# Patient Record
Sex: Male | Born: 1990
Health system: Southern US, Academic
[De-identification: ages and names within clinical notes are randomized; demographics above are authoritative.]

## PROBLEM LIST (undated history)

## (undated) ENCOUNTER — Encounter
Attending: Student in an Organized Health Care Education/Training Program | Primary: Student in an Organized Health Care Education/Training Program

## (undated) ENCOUNTER — Encounter: Payer: MEDICARE | Attending: Psychosomatic Medicine | Primary: Psychosomatic Medicine

## (undated) ENCOUNTER — Encounter

## (undated) ENCOUNTER — Telehealth: Attending: Internal Medicine | Primary: Internal Medicine

## (undated) ENCOUNTER — Encounter: Attending: Critical Care Medicine | Primary: Critical Care Medicine

## (undated) ENCOUNTER — Encounter: Payer: MEDICARE | Attending: Internal Medicine | Primary: Internal Medicine

## (undated) ENCOUNTER — Ambulatory Visit: Payer: MEDICARE

## (undated) ENCOUNTER — Inpatient Hospital Stay

## (undated) ENCOUNTER — Encounter: Attending: Internal Medicine | Primary: Internal Medicine

## (undated) ENCOUNTER — Ambulatory Visit

## (undated) ENCOUNTER — Telehealth

## (undated) ENCOUNTER — Encounter
Payer: MEDICARE | Attending: Student in an Organized Health Care Education/Training Program | Primary: Student in an Organized Health Care Education/Training Program

## (undated) ENCOUNTER — Ambulatory Visit: Attending: Internal Medicine | Primary: Internal Medicine

## (undated) ENCOUNTER — Encounter: Payer: MEDICARE | Attending: Family Medicine | Primary: Family Medicine

## (undated) ENCOUNTER — Encounter: Attending: Psychosomatic Medicine | Primary: Psychosomatic Medicine

## (undated) ENCOUNTER — Telehealth
Attending: Student in an Organized Health Care Education/Training Program | Primary: Student in an Organized Health Care Education/Training Program

## (undated) ENCOUNTER — Encounter: Attending: Family Medicine | Primary: Family Medicine

## (undated) ENCOUNTER — Telehealth: Attending: Pulmonary Disease | Primary: Pulmonary Disease

## (undated) ENCOUNTER — Telehealth: Attending: Clinical | Primary: Clinical

## (undated) ENCOUNTER — Encounter: Payer: MEDICARE | Attending: Psychiatric/Mental Health | Primary: Psychiatric/Mental Health

## (undated) ENCOUNTER — Ambulatory Visit
Payer: MEDICARE | Attending: Student in an Organized Health Care Education/Training Program | Primary: Student in an Organized Health Care Education/Training Program

## (undated) ENCOUNTER — Telehealth: Attending: Critical Care Medicine | Primary: Critical Care Medicine

## (undated) ENCOUNTER — Ambulatory Visit: Attending: Pharmacist | Primary: Pharmacist

---
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
## 2014-02-06 ENCOUNTER — Emergency Department: Payer: Self-pay | Admitting: Emergency Medicine

## 2014-06-06 ENCOUNTER — Emergency Department: Payer: Self-pay | Admitting: Emergency Medicine

## 2014-06-06 LAB — CBC
HCT: 43.7 % (ref 40.0–52.0)
HGB: 14.9 g/dL (ref 13.0–18.0)
MCH: 27.4 pg (ref 26.0–34.0)
MCHC: 34.1 g/dL (ref 32.0–36.0)
MCV: 80 fL (ref 80–100)
Platelet: 384 10*3/uL (ref 150–440)
RBC: 5.43 10*6/uL (ref 4.40–5.90)
RDW: 14.4 % (ref 11.5–14.5)
WBC: 9.9 10*3/uL (ref 3.8–10.6)

## 2014-06-06 LAB — COMPREHENSIVE METABOLIC PANEL
ALK PHOS: 70 U/L
Albumin: 4.3 g/dL (ref 3.4–5.0)
Anion Gap: 11 (ref 7–16)
BILIRUBIN TOTAL: 0.5 mg/dL (ref 0.2–1.0)
BUN: 14 mg/dL (ref 7–18)
CALCIUM: 9.5 mg/dL (ref 8.5–10.1)
CHLORIDE: 104 mmol/L (ref 98–107)
CO2: 26 mmol/L (ref 21–32)
Creatinine: 0.67 mg/dL (ref 0.60–1.30)
EGFR (African American): >60
EGFR (Non-African Amer.): >60
GLUCOSE: 101 mg/dL — AB (ref 65–99)
Osmolality: 282 (ref 275–301)
Potassium: 4.1 mmol/L (ref 3.5–5.1)
SGOT(AST): 39 U/L — ABNORMAL HIGH (ref 15–37)
SGPT (ALT): 61 U/L (ref 12–78)
Sodium: 141 mmol/L (ref 136–145)
TOTAL PROTEIN: 7.5 g/dL (ref 6.4–8.2)

## 2014-06-06 LAB — DRUG SCREEN, URINE

## 2014-06-06 LAB — SALICYLATE LEVEL

## 2014-06-06 LAB — URINALYSIS, COMPLETE
Bacteria: NONE SEEN
Glucose,UR: NEGATIVE mg/dL (ref 0–75)
Ketone: NEGATIVE
LEUKOCYTE ESTERASE: NEGATIVE
NITRITE: NEGATIVE
PROTEIN: NEGATIVE
Ph: 7 (ref 4.5–8.0)
RBC,UR: 1 /HPF (ref 0–5)
Specific Gravity: 1.017 (ref 1.003–1.030)
Squamous Epithelial: NONE SEEN
WBC UR: NONE SEEN /HPF (ref 0–5)

## 2014-06-06 LAB — ETHANOL: Ethanol %: <0.003 % (ref 0.000–0.080)

## 2014-06-06 LAB — ACETAMINOPHEN LEVEL

## 2014-12-07 IMAGING — CR DG CHEST 1V PORT
1 series · 1 of 1 positions shown · non-contrast
Comparison: None.

CLINICAL DATA: Shortness of breath.  History of tracheomalacia.

EXAM:
PORTABLE CHEST - 1 VIEW

[ap]
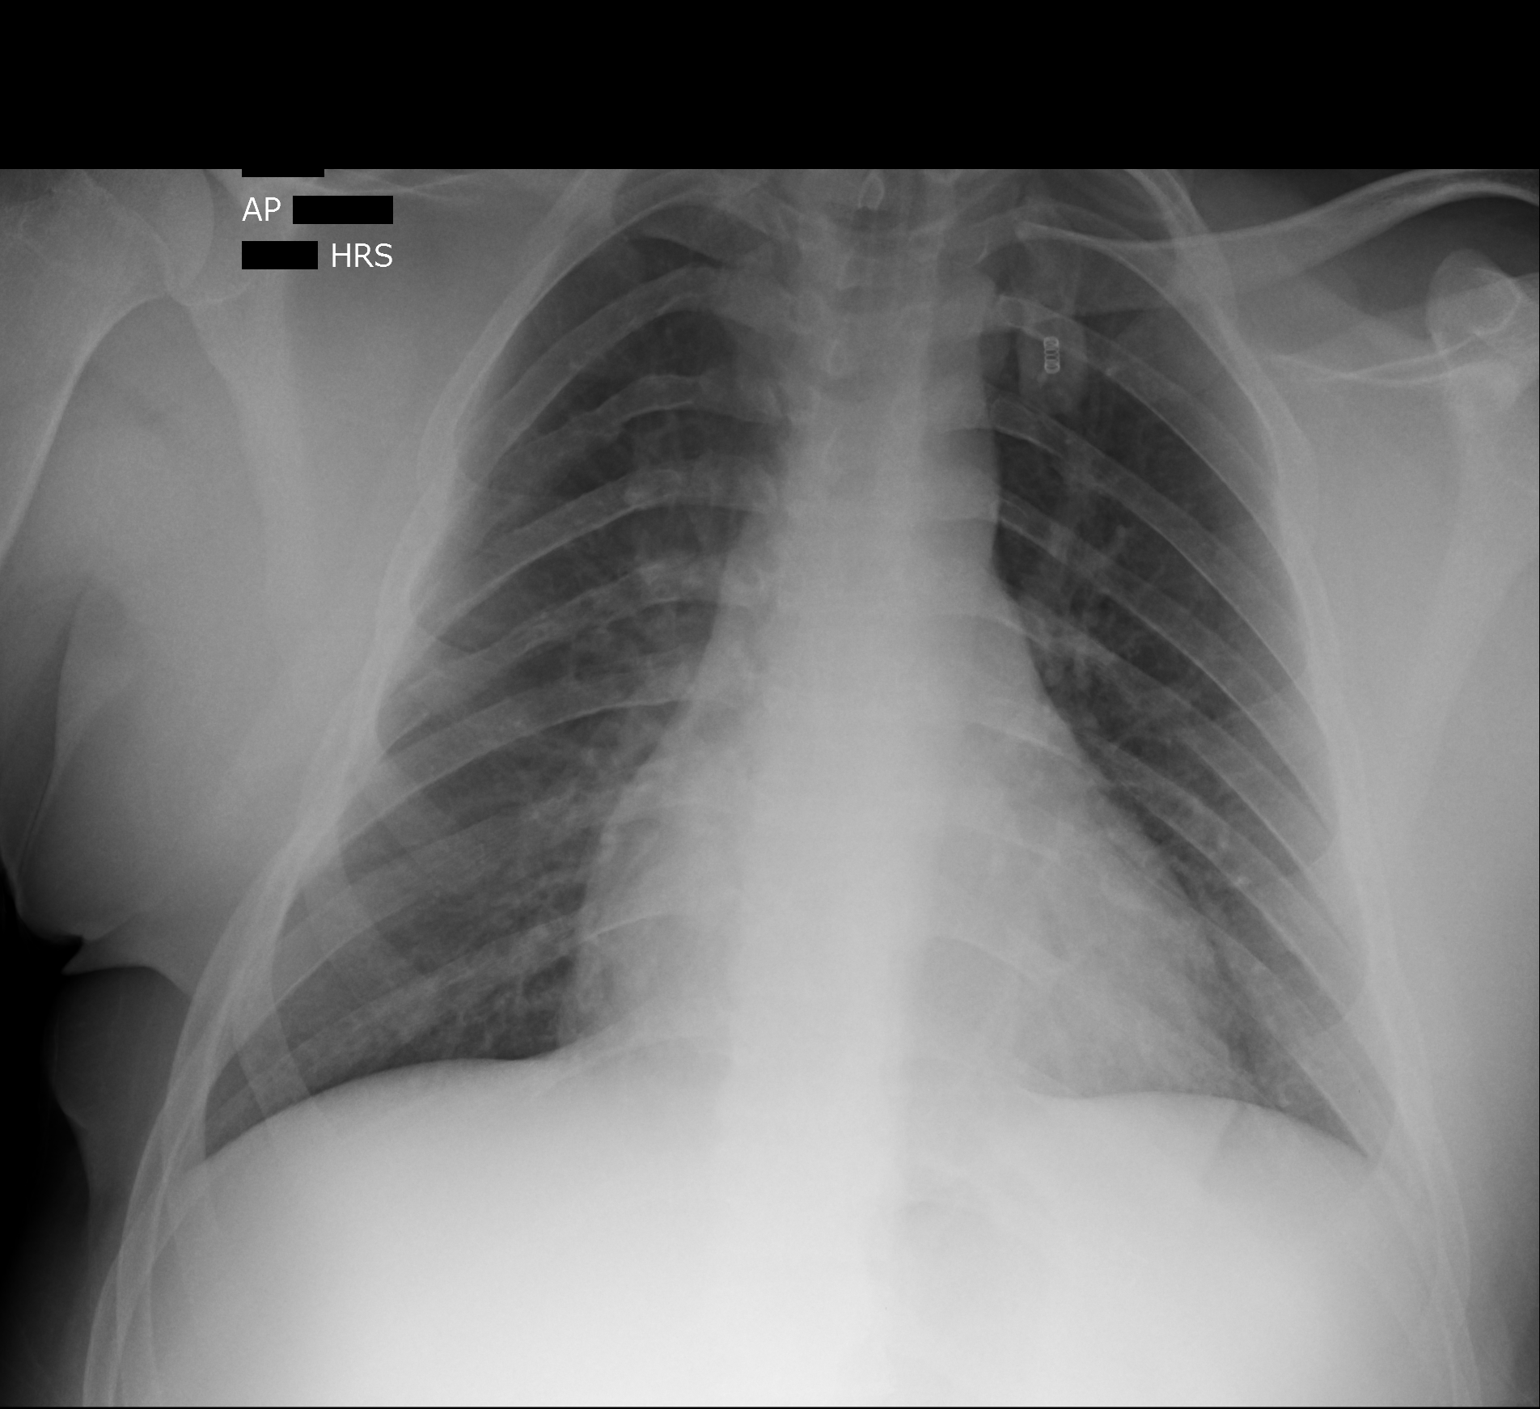

[1 of 1 positions shown; findings below may reference images not displayed]

FINDINGS: Normal heart size and mediastinal contours. No acute infiltrate or
edema. No effusion or pneumothorax. Dysplastic or previously
fractured posterior right fifth and seventh ribs.
IMPRESSION: No active disease.

## 2015-03-31 NOTE — Consult Note (Signed)
PATIENT NAME:  Rodney Morris, Rodney Morris MR#:  [id number] DATE OF BIRTH:  1991/07/27  DATE OF CONSULTATION:  06/07/2014  REFERRING PHYSICIAN:   CONSULTING PHYSICIAN:  Wise Fees K. Londen Bok, MD  AGE:  24 years.  SEX:  Male.  RACE:  White.  SUBJECTIVE:  Patient was seen in consultation in the [facility] Emergency Room.  Patient is a 24 year old white male who is not employed and is on disability for birth defect and is on oxygen and 4 nebulizers for the same.  Patient had been inpatient at [facility] for 2 years, stabilized, and currently has been living at [facility] since January 2015.  The patient was brought because he was depressed and he was upset after he shoplifted and then they would not let him go out.    MENTAL STATUS:  Patient alert and oriented.  Calm, pleasant and cooperative.  No agitation.  Affect is neutral and mood stable.  Denies feeling depressed.  Does not appear to be responding to internal stimuli.  Cognition grossly intact.  Denies any plans to hurt himself or others.  Contract for safety.  Insight and judgment adequate.    IMPRESSION:  Adjustment disorder with depressed mood.  History of schizoaffective disorder, currently stable.  Recommend DCIDC and discharge patient back to [facility] as he does need a secured environment.   ____________________________ Jannet MantisSurya K. Guss Bundehalla, MD skc:ts D: 06/07/2014 15:42:18 ET T: 06/07/2014 19:17:19 ET JOB#: [id number]  cc: Monika SalkSurya K. Guss Bundehalla, MD, <Dictator> Beau FannySURYA K Yisrael Obryan MD ELECTRONICALLY SIGNED 06/10/2014 17:42

## 2017-06-30 MED ORDER — MIRTAZAPINE 15 MG TABLET
ORAL_TABLET | Freq: Every evening | ORAL | 0 refills | 0.00000 days | Status: CP
Start: 2017-06-30 — End: 2017-06-30

## 2017-06-30 MED ORDER — MIRTAZAPINE 15 MG TABLET: tablet | 0 refills | 0 days | Status: AC

## 2017-07-31 ENCOUNTER — Ambulatory Visit
Admission: RE | Admit: 2017-07-31 | Discharge: 2017-07-31 | Payer: Commercial Managed Care - PPO | Attending: Psychiatry | Admitting: Psychiatry

## 2017-07-31 DIAGNOSIS — F4310 Post-traumatic stress disorder, unspecified: Secondary | ICD-10-CM

## 2017-07-31 DIAGNOSIS — F39 Unspecified mood [affective] disorder: Principal | ICD-10-CM

## 2017-07-31 MED ORDER — VALPROIC ACID (AS SODIUM SALT) 250 MG/5 ML ORAL SOLUTION
ORAL | 3 refills | 0.00000 days | Status: CP
Start: 2017-07-31 — End: 2017-11-05

## 2017-07-31 MED ORDER — LORAZEPAM 0.5 MG TABLET
ORAL_TABLET | ORAL | 3 refills | 0 days | Status: CP
Start: 2017-07-31 — End: 2017-11-05

## 2017-09-06 ENCOUNTER — Emergency Department
Admission: EM | Admit: 2017-09-06 | Discharge: 2017-09-06 | Disposition: A | Discharge status: Home / Self Care | Source: Intra-hospital

## 2017-09-06 ENCOUNTER — Emergency Department
Admission: EM | Admit: 2017-09-06 | Discharge: 2017-09-06 | Disposition: A | Discharge status: Home / Self Care | Payer: MEDICARE | Source: Intra-hospital

## 2017-09-06 DIAGNOSIS — R1032 Left lower quadrant pain: Principal | ICD-10-CM

## 2017-09-06 MED ORDER — OXYCODONE-ACETAMINOPHEN 5 MG-325 MG TABLET
ORAL_TABLET | Freq: Three times a day (TID) | ORAL | 0 refills | 0.00000 days | Status: SS | PRN
Start: 2017-09-06 — End: 2018-03-18

## 2017-09-06 MED ORDER — ONDANSETRON HCL 4 MG TABLET
ORAL_TABLET | Freq: Three times a day (TID) | ORAL | 0 refills | 0 days | Status: SS | PRN
Start: 2017-09-06 — End: 2018-03-16

## 2017-09-08 ENCOUNTER — Emergency Department
Admission: EM | Admit: 2017-09-08 | Discharge: 2017-09-08 | Disposition: A | Discharge status: Home / Self Care | Payer: MEDICARE | Source: Intra-hospital | Attending: Emergency Medicine

## 2017-09-08 ENCOUNTER — Emergency Department
Admission: EM | Admit: 2017-09-08 | Discharge: 2017-09-08 | Disposition: A | Discharge status: Home / Self Care | Payer: MEDICARE | Source: Intra-hospital

## 2017-09-08 DIAGNOSIS — R109 Unspecified abdominal pain: Principal | ICD-10-CM

## 2017-09-21 DIAGNOSIS — R0602 Shortness of breath: Principal | ICD-10-CM

## 2017-09-21 MED ORDER — ACETAMINOPHEN 160 MG/5 ML (5 ML) ORAL SUSPENSION
ORAL | 0 refills | 0 days | Status: SS | PRN
Start: 2017-09-21 — End: 2017-12-08

## 2017-09-24 ENCOUNTER — Inpatient Hospital Stay
Admission: EM | Admit: 2017-09-24 | Discharge: 2017-09-26 | Disposition: A | Discharge status: Home / Self Care | Payer: MEDICARE | Source: Intra-hospital

## 2017-09-24 ENCOUNTER — Inpatient Hospital Stay
Admission: EM | Admit: 2017-09-24 | Discharge: 2017-09-26 | Disposition: A | Discharge status: Home / Self Care | Payer: MEDICARE | Source: Intra-hospital | Attending: Pulmonary Disease | Admitting: Pulmonary Disease

## 2017-09-24 DIAGNOSIS — R0602 Shortness of breath: Principal | ICD-10-CM

## 2017-09-26 MED ORDER — CEFDINIR 300 MG CAPSULE
ORAL_CAPSULE | Freq: Two times a day (BID) | ORAL | 0 refills | 0.00000 days | Status: CP
Start: 2017-09-26 — End: 2018-03-14

## 2017-09-26 MED ORDER — PREDNISONE 10 MG TABLET
ORAL_TABLET | 0 refills | 0 days | Status: CP
Start: 2017-09-26 — End: 2017-12-08

## 2017-10-07 ENCOUNTER — Emergency Department
Admission: EM | Admit: 2017-10-07 | Discharge: 2017-10-08 | Disposition: A | Discharge status: Home / Self Care | Payer: MEDICARE | Source: Intra-hospital

## 2017-10-07 DIAGNOSIS — M25559 Pain in unspecified hip: Principal | ICD-10-CM

## 2017-10-08 MED ORDER — HYDROCODONE 5 MG-ACETAMINOPHEN 325 MG TABLET
ORAL_TABLET | Freq: Four times a day (QID) | ORAL | 0 refills | 0 days | Status: CP | PRN
Start: 2017-10-08 — End: 2018-03-14

## 2017-10-08 MED ORDER — IBUPROFEN 600 MG TABLET
ORAL_TABLET | Freq: Four times a day (QID) | ORAL | 0 refills | 0.00000 days | Status: SS | PRN
Start: 2017-10-08 — End: 2017-12-08

## 2017-10-21 ENCOUNTER — Ambulatory Visit: Admission: RE | Admit: 2017-10-21 | Discharge: 2017-10-21 | Payer: MEDICARE | Attending: Family | Admitting: Family

## 2017-10-21 DIAGNOSIS — H60502 Unspecified acute noninfective otitis externa, left ear: Principal | ICD-10-CM

## 2017-10-21 MED ORDER — CIPROFLOXACIN 0.3 %-DEXAMETHASONE 0.1 % EAR DROPS,SUSPENSION
Freq: Two times a day (BID) | OTIC | 0 refills | 0.00000 days | Status: CP
Start: 2017-10-21 — End: 2018-03-14

## 2017-10-26 ENCOUNTER — Inpatient Hospital Stay
Admission: EM | Admit: 2017-10-26 | Discharge: 2017-10-28 | Disposition: A | Discharge status: Home / Self Care | Payer: MEDICARE | Source: Intra-hospital

## 2017-10-26 ENCOUNTER — Inpatient Hospital Stay
Admission: EM | Admit: 2017-10-26 | Discharge: 2017-10-28 | Disposition: A | Discharge status: Home / Self Care | Payer: MEDICARE | Source: Intra-hospital | Admitting: Internal Medicine

## 2017-10-26 DIAGNOSIS — R05 Cough: Principal | ICD-10-CM

## 2017-10-27 DIAGNOSIS — R05 Cough: Principal | ICD-10-CM

## 2017-10-28 MED ORDER — GABAPENTIN 100 MG CAPSULE
ORAL_CAPSULE | Freq: Three times a day (TID) | ORAL | 0 refills | 0.00000 days | Status: SS
Start: 2017-10-28 — End: 2017-12-08

## 2017-10-28 MED ORDER — TIZANIDINE 4 MG TABLET
ORAL_TABLET | Freq: Three times a day (TID) | ORAL | 0 refills | 0.00000 days | Status: SS | PRN
Start: 2017-10-28 — End: 2017-12-08

## 2017-10-28 MED ORDER — AMOXICILLIN 875 MG-POTASSIUM CLAVULANATE 125 MG TABLET
ORAL_TABLET | Freq: Two times a day (BID) | ORAL | 0 refills | 0.00000 days | Status: CP
Start: 2017-10-28 — End: 2018-03-14

## 2017-11-05 ENCOUNTER — Ambulatory Visit
Admission: RE | Admit: 2017-11-05 | Discharge: 2017-11-05 | Payer: Commercial Managed Care - PPO | Attending: Psychosomatic Medicine | Admitting: Psychosomatic Medicine

## 2017-11-05 DIAGNOSIS — F411 Generalized anxiety disorder: Principal | ICD-10-CM

## 2017-11-05 DIAGNOSIS — F39 Unspecified mood [affective] disorder: Secondary | ICD-10-CM

## 2017-11-05 MED ORDER — VALPROIC ACID (AS SODIUM SALT) 250 MG/5 ML ORAL SOLUTION
ORAL | 3 refills | 0 days | Status: SS
Start: 2017-11-05 — End: 2017-12-08

## 2017-11-05 MED ORDER — LORAZEPAM 0.5 MG TABLET
ORAL_TABLET | ORAL | 3 refills | 0.00000 days | Status: SS
Start: 2017-11-05 — End: 2017-12-08

## 2017-11-05 MED ORDER — CITALOPRAM 40 MG TABLET
ORAL_TABLET | Freq: Every evening | ORAL | 3 refills | 0 days | Status: SS
Start: 2017-11-05 — End: 2017-12-08

## 2017-11-05 MED ORDER — MIRTAZAPINE 15 MG TABLET
ORAL_TABLET | Freq: Every evening | ORAL | 0 refills | 0.00000 days | Status: SS
Start: 2017-11-05 — End: 2017-12-08

## 2017-11-11 ENCOUNTER — Ambulatory Visit
Admission: RE | Admit: 2017-11-11 | Discharge: 2017-11-11 | Payer: MEDICARE | Attending: Community Health | Admitting: Community Health

## 2017-11-11 DIAGNOSIS — M25531 Pain in right wrist: Principal | ICD-10-CM

## 2017-11-11 DIAGNOSIS — M25431 Effusion, right wrist: Secondary | ICD-10-CM

## 2017-11-11 MED ORDER — METHYLPREDNISOLONE 4 MG TABLETS IN A DOSE PACK
ORAL_TABLET | 0 refills | 0 days | Status: CP
Start: 2017-11-11 — End: 2017-12-08

## 2017-11-11 MED ORDER — OXYCODONE 5 MG TABLET
ORAL_TABLET | Freq: Four times a day (QID) | ORAL | 0 refills | 0.00000 days | Status: SS | PRN
Start: 2017-11-11 — End: 2018-03-18

## 2017-11-17 ENCOUNTER — Emergency Department
Admission: EM | Admit: 2017-11-17 | Discharge: 2017-11-18 | Disposition: A | Discharge status: Home / Self Care | Payer: MEDICARE | Source: Intra-hospital

## 2017-11-17 ENCOUNTER — Emergency Department
Admission: EM | Admit: 2017-11-17 | Discharge: 2017-11-18 | Disposition: A | Discharge status: Home / Self Care | Payer: MEDICARE | Source: Intra-hospital | Admitting: Emergency Medicine

## 2017-11-18 DIAGNOSIS — M25551 Pain in right hip: Principal | ICD-10-CM

## 2017-11-18 DIAGNOSIS — W009XXA Unspecified fall due to ice and snow, initial encounter: Secondary | ICD-10-CM

## 2017-11-18 DIAGNOSIS — M25531 Pain in right wrist: Secondary | ICD-10-CM

## 2017-11-18 DIAGNOSIS — S63501A Unspecified sprain of right wrist, initial encounter: Principal | ICD-10-CM

## 2017-11-23 ENCOUNTER — Inpatient Hospital Stay
Admission: EM | Admit: 2017-11-23 | Discharge: 2017-12-08 | Disposition: A | Discharge status: Home Health | Payer: MEDICARE | Source: Intra-hospital

## 2017-11-23 ENCOUNTER — Ambulatory Visit: Admit: 2017-11-23 | Discharge: 2017-12-08 | Disposition: A | Discharge status: Home Health | Payer: MEDICARE

## 2017-11-23 DIAGNOSIS — R0602 Shortness of breath: Principal | ICD-10-CM

## 2017-11-24 DIAGNOSIS — R0602 Shortness of breath: Principal | ICD-10-CM

## 2017-11-26 DIAGNOSIS — R0602 Shortness of breath: Principal | ICD-10-CM

## 2017-11-27 DIAGNOSIS — R0602 Shortness of breath: Principal | ICD-10-CM

## 2017-11-29 MED ORDER — TOBRAMYCIN IVPB IN 100 ML (STANDARD DOSING) (OVF)
Freq: Two times a day (BID) | INTRAVENOUS | 0 refills | 0.00000 days | Status: CP
Start: 2017-11-29 — End: 2017-12-08

## 2017-11-29 MED ORDER — CEFEPIME 2 GRAM/100 ML IN DEXTROSE (ISO-OSMOTIC) INTRAVENOUS PIGGYBACK
Freq: Three times a day (TID) | INTRAVENOUS | 0 refills | 0.00000 days | Status: CP
Start: 2017-11-29 — End: 2017-12-08

## 2017-12-08 MED ORDER — LORAZEPAM 0.5 MG TABLET
GASTROSTOMY | 0 days | Status: SS
Start: 2017-12-08 — End: 2018-03-18

## 2017-12-08 MED ORDER — PROMETHAZINE 25 MG TABLET
ORAL_TABLET | Freq: Four times a day (QID) | GASTROSTOMY | 0 refills | 0 days | Status: CP | PRN
Start: 2017-12-08 — End: 2017-12-08

## 2017-12-08 MED ORDER — SODIUM CHLORIDE 7 % FOR NEBULIZATION
Freq: Four times a day (QID) | RESPIRATORY_TRACT | 0 refills | 0 days | Status: CP
Start: 2017-12-08 — End: 2018-05-12

## 2017-12-08 MED ORDER — CHOLECALCIFEROL (VITAMIN D3) 50 MCG (2,000 UNIT) CAPSULE
Freq: Every day | GASTROSTOMY | 0 refills | 0 days
Start: 2017-12-08 — End: ?

## 2017-12-08 MED ORDER — ACETAMINOPHEN 160 MG/5 ML (5 ML) ORAL SUSPENSION
GASTROSTOMY | 0 refills | 0 days | PRN
Start: 2017-12-08 — End: 2018-07-23

## 2017-12-08 MED ORDER — TIZANIDINE 4 MG TABLET
ORAL_TABLET | Freq: Three times a day (TID) | GASTROSTOMY | 0 refills | 0.00000 days | PRN
Start: 2017-12-08 — End: 2018-05-20

## 2017-12-08 MED ORDER — CITALOPRAM 40 MG TABLET
ORAL_TABLET | Freq: Every evening | GASTROSTOMY | 0 refills | 0 days
Start: 2017-12-08 — End: 2017-12-17

## 2017-12-08 MED ORDER — GABAPENTIN 100 MG CAPSULE
ORAL_CAPSULE | Freq: Three times a day (TID) | GASTROSTOMY | 0 refills | 0 days | Status: SS
Start: 2017-12-08 — End: 2018-02-23

## 2017-12-08 MED ORDER — LINACLOTIDE 290 MCG CAPSULE
ORAL_CAPSULE | Freq: Every day | GASTROSTOMY | 0 refills | 0.00000 days
Start: 2017-12-08 — End: ?

## 2017-12-08 MED ORDER — LEVOTHYROXINE 75 MCG TABLET
ORAL_TABLET | Freq: Every day | GASTROSTOMY | 0 refills | 0 days
Start: 2017-12-08 — End: 2018-04-16

## 2017-12-08 MED ORDER — CETIRIZINE 10 MG TABLET
Freq: Every evening | GASTROSTOMY | 0 refills | 0.00000 days
Start: 2017-12-08 — End: ?

## 2017-12-08 MED ORDER — PROMETHAZINE 6.25 MG/5 ML ORAL SYRUP
Freq: Four times a day (QID) | ORAL | 0 refills | 0.00000 days | Status: SS | PRN
Start: 2017-12-08 — End: 2018-03-18

## 2017-12-08 MED ORDER — VALPROIC ACID (AS SODIUM SALT) 250 MG/5 ML ORAL SOLUTION
GASTROSTOMY | 0 days
Start: 2017-12-08 — End: 2017-12-17

## 2017-12-08 MED ORDER — ALBUTEROL SULFATE 2.5 MG/3 ML (0.083 %) SOLUTION FOR NEBULIZATION
Freq: Four times a day (QID) | RESPIRATORY_TRACT | 0 refills | 0 days | Status: SS | PRN
Start: 2017-12-08 — End: 2018-05-07

## 2017-12-08 MED ORDER — MINOCYCLINE 100 MG CAPSULE
Freq: Every evening | GASTROSTOMY | 0 refills | 0 days
Start: 2017-12-08 — End: 2018-04-16

## 2017-12-08 MED ORDER — IPRATROPIUM BROMIDE 0.02 % SOLUTION FOR INHALATION
Freq: Four times a day (QID) | RESPIRATORY_TRACT | 12 refills | 0.00000 days | PRN
Start: 2017-12-08 — End: 2018-04-06

## 2017-12-08 MED ORDER — DICYCLOMINE 10 MG CAPSULE
ORAL_CAPSULE | Freq: Four times a day (QID) | GASTROSTOMY | 0 refills | 0.00000 days | Status: CP
Start: 2017-12-08 — End: 2018-03-14

## 2017-12-08 MED ORDER — IBUPROFEN 600 MG TABLET
ORAL_TABLET | Freq: Four times a day (QID) | GASTROSTOMY | 0 refills | 0.00000 days | PRN
Start: 2017-12-08 — End: 2018-04-16

## 2017-12-08 MED ORDER — MIRTAZAPINE 15 MG TABLET
ORAL_TABLET | Freq: Every evening | GASTROSTOMY | 0 refills | 0.00000 days
Start: 2017-12-08 — End: 2017-12-17

## 2017-12-09 MED ORDER — AZITHROMYCIN 250 MG TABLET
PACK | GASTROSTOMY | 0 refills | 0 days
Start: 2017-12-09 — End: 2018-01-07

## 2017-12-14 ENCOUNTER — Encounter: Admit: 2017-12-14 | Discharge: 2017-12-15 | Payer: MEDICARE | Attending: Family Medicine | Primary: Family Medicine

## 2017-12-14 DIAGNOSIS — M25561 Pain in right knee: Principal | ICD-10-CM

## 2017-12-17 ENCOUNTER — Encounter
Admit: 2017-12-17 | Discharge: 2017-12-18 | Payer: MEDICARE | Attending: Psychosomatic Medicine | Primary: Psychosomatic Medicine

## 2017-12-17 DIAGNOSIS — F329 Major depressive disorder, single episode, unspecified: Principal | ICD-10-CM

## 2017-12-17 MED ORDER — VALPROIC ACID (AS SODIUM SALT) 250 MG/5 ML ORAL SOLUTION: 250 mg | mL | 2 refills | 0 days | Status: AC

## 2017-12-17 MED ORDER — VALPROIC ACID (AS SODIUM SALT) 250 MG/5 ML ORAL SOLUTION
GASTROSTOMY | 2 refills | 0.00000 days | Status: CP
Start: 2017-12-17 — End: 2018-01-22

## 2017-12-17 MED ORDER — MIRTAZAPINE 15 MG TABLET
ORAL_TABLET | Freq: Every evening | GASTROSTOMY | 2 refills | 0 days
Start: 2017-12-17 — End: 2018-06-24

## 2017-12-17 MED ORDER — LORAZEPAM 0.5 MG TABLET
ORAL_TABLET | 2 refills | 0 days | Status: CP
Start: 2017-12-17 — End: ?

## 2017-12-17 MED ORDER — CITALOPRAM 40 MG TABLET
ORAL_TABLET | Freq: Every evening | GASTROSTOMY | 2 refills | 0.00000 days | Status: CP
Start: 2017-12-17 — End: 2018-06-24

## 2017-12-19 ENCOUNTER — Ambulatory Visit: Admit: 2017-12-19 | Discharge: 2017-12-21 | Disposition: A | Discharge status: Home Health | Payer: MEDICARE

## 2017-12-19 DIAGNOSIS — R05 Cough: Principal | ICD-10-CM

## 2017-12-21 MED ORDER — AMOXICILLIN 400 MG-POTASSIUM CLAVULANATE 57 MG/5 ML ORAL SUSPENSION
Freq: Two times a day (BID) | GASTROSTOMY | 0 refills | 0 days | Status: CP
Start: 2017-12-21 — End: 2017-12-26

## 2017-12-21 MED ORDER — ALBUTEROL SULFATE 2.5 MG/3 ML (0.083 %) SOLUTION FOR NEBULIZATION
RESPIRATORY_TRACT | 2 refills | 0 days | Status: SS | PRN
Start: 2017-12-21 — End: 2017-12-24

## 2017-12-21 MED ORDER — PROMETHAZINE 12.5 MG TABLET
ORAL_TABLET | Freq: Four times a day (QID) | ORAL | 0 refills | 0.00000 days | Status: SS | PRN
Start: 2017-12-21 — End: 2018-03-18

## 2017-12-21 MED ORDER — TIZANIDINE 4 MG TABLET
ORAL_TABLET | Freq: Three times a day (TID) | GASTROSTOMY | 0 refills | 0.00000 days | Status: SS | PRN
Start: 2017-12-21 — End: 2017-12-24

## 2017-12-21 MED ORDER — ONDANSETRON HCL 4 MG TABLET
ORAL_TABLET | Freq: Three times a day (TID) | ORAL | 0 refills | 0.00000 days | Status: SS | PRN
Start: 2017-12-21 — End: 2018-03-16

## 2017-12-23 ENCOUNTER — Ambulatory Visit: Admit: 2017-12-23 | Discharge: 2017-12-26 | Disposition: A | Discharge status: Home Health | Payer: MEDICARE

## 2017-12-23 DIAGNOSIS — R05 Cough: Principal | ICD-10-CM

## 2018-01-02 ENCOUNTER — Encounter: Admit: 2018-01-02 | Discharge: 2018-01-03 | Payer: MEDICARE | Attending: Family Medicine | Primary: Family Medicine

## 2018-01-02 DIAGNOSIS — R05 Cough: Secondary | ICD-10-CM

## 2018-01-02 DIAGNOSIS — R6889 Other general symptoms and signs: Principal | ICD-10-CM

## 2018-01-02 MED ORDER — PREDNISONE 10 MG TABLET
ORAL_TABLET | Freq: Every day | ORAL | 0 refills | 0.00000 days | Status: SS
Start: 2018-01-02 — End: 2018-03-18

## 2018-01-02 MED ORDER — AMOXICILLIN 875 MG-POTASSIUM CLAVULANATE 125 MG TABLET
ORAL_TABLET | Freq: Two times a day (BID) | ORAL | 0 refills | 0 days | Status: CP
Start: 2018-01-02 — End: 2018-03-14

## 2018-01-03 ENCOUNTER — Encounter: Admit: 2018-01-03 | Discharge: 2018-01-06 | Payer: MEDICARE

## 2018-01-03 ENCOUNTER — Encounter: Admit: 2018-01-03 | Discharge: 2018-01-03 | Disposition: A | Discharge status: Home / Self Care | Payer: MEDICARE

## 2018-01-03 ENCOUNTER — Emergency Department: Admit: 2018-01-03 | Discharge: 2018-01-03 | Disposition: A | Discharge status: Home / Self Care | Payer: MEDICARE

## 2018-01-03 DIAGNOSIS — R0600 Dyspnea, unspecified: Principal | ICD-10-CM

## 2018-01-07 ENCOUNTER — Ambulatory Visit: Admit: 2018-01-07 | Discharge: 2018-01-07 | Payer: MEDICARE | Attending: Internal Medicine | Primary: Internal Medicine

## 2018-01-07 ENCOUNTER — Ambulatory Visit: Admit: 2018-01-07 | Discharge: 2018-01-07 | Payer: MEDICARE

## 2018-01-07 ENCOUNTER — Encounter: Admit: 2018-01-07 | Discharge: 2018-01-07 | Payer: MEDICARE

## 2018-01-07 DIAGNOSIS — K224 Dyskinesia of esophagus: Secondary | ICD-10-CM

## 2018-01-07 DIAGNOSIS — E039 Hypothyroidism, unspecified: Secondary | ICD-10-CM

## 2018-01-07 DIAGNOSIS — R569 Unspecified convulsions: Secondary | ICD-10-CM

## 2018-01-07 DIAGNOSIS — L309 Dermatitis, unspecified: Secondary | ICD-10-CM

## 2018-01-07 DIAGNOSIS — J398 Other specified diseases of upper respiratory tract: Principal | ICD-10-CM

## 2018-01-07 DIAGNOSIS — M76891 Other specified enthesopathies of right lower limb, excluding foot: Secondary | ICD-10-CM

## 2018-01-07 DIAGNOSIS — K210 Gastro-esophageal reflux disease with esophagitis: Principal | ICD-10-CM

## 2018-01-07 DIAGNOSIS — F39 Unspecified mood [affective] disorder: Secondary | ICD-10-CM

## 2018-01-07 DIAGNOSIS — K222 Esophageal obstruction: Secondary | ICD-10-CM

## 2018-01-07 DIAGNOSIS — S6982XD Other specified injuries of left wrist, hand and finger(s), subsequent encounter: Secondary | ICD-10-CM

## 2018-01-07 MED ORDER — ALBUTEROL SULFATE HFA 90 MCG/ACTUATION AEROSOL INHALER
Freq: Four times a day (QID) | RESPIRATORY_TRACT | 11 refills | 0 days | Status: CP | PRN
Start: 2018-01-07 — End: 2018-04-06

## 2018-01-07 MED ORDER — BUDESONIDE 0.5 MG/2 ML SUSPENSION FOR NEBULIZATION
Freq: Two times a day (BID) | RESPIRATORY_TRACT | 11 refills | 0.00000 days | Status: CP
Start: 2018-01-07 — End: 2018-03-22

## 2018-01-07 MED ORDER — TRIAMCINOLONE ACETONIDE 0.1 % TOPICAL CREAM
Freq: Two times a day (BID) | TOPICAL | 11 refills | 0 days | Status: CP
Start: 2018-01-07 — End: 2019-01-07

## 2018-01-08 MED ORDER — AZITHROMYCIN 500 MG TABLET
ORAL_TABLET | GASTROSTOMY | 11 refills | 0 days | Status: CP
Start: 2018-01-08 — End: 2019-01-08

## 2018-01-13 MED ORDER — CODEINE 10 MG-GUAIFENESIN 100 MG/5 ML ORAL LIQUID
Freq: Three times a day (TID) | ORAL | 0 refills | 0 days | Status: SS | PRN
Start: 2018-01-13 — End: 2018-03-31

## 2018-01-19 ENCOUNTER — Encounter
Admit: 2018-01-19 | Discharge: 2018-01-20 | Disposition: A | Discharge status: Home / Self Care | Payer: MEDICARE | Attending: Emergency Medicine

## 2018-01-19 DIAGNOSIS — M25551 Pain in right hip: Principal | ICD-10-CM

## 2018-01-19 MED ORDER — OXYCODONE 5 MG TABLET
ORAL_TABLET | Freq: Three times a day (TID) | ORAL | 0 refills | 0 days | Status: SS | PRN
Start: 2018-01-19 — End: 2018-03-18

## 2018-01-22 ENCOUNTER — Encounter: Admit: 2018-01-22 | Discharge: 2018-01-23 | Payer: MEDICARE

## 2018-01-22 DIAGNOSIS — M25551 Pain in right hip: Principal | ICD-10-CM

## 2018-01-22 DIAGNOSIS — F39 Unspecified mood [affective] disorder: Secondary | ICD-10-CM

## 2018-01-22 DIAGNOSIS — R569 Unspecified convulsions: Secondary | ICD-10-CM

## 2018-01-22 MED ORDER — VALPROIC ACID (AS SODIUM SALT) 250 MG/5 ML ORAL SOLUTION: mL | 0 refills | 0 days | Status: AC

## 2018-01-22 MED ORDER — VALPROIC ACID (AS SODIUM SALT) 250 MG/5 ML ORAL SOLUTION
TOPICAL | 0 refills | 0.00000 days | Status: CP
Start: 2018-01-22 — End: 2018-06-24

## 2018-02-21 ENCOUNTER — Encounter: Admit: 2018-02-21 | Discharge: 2018-02-23 | Payer: MEDICARE

## 2018-02-21 ENCOUNTER — Encounter: Admit: 2018-02-21 | Discharge: 2018-02-23 | Payer: MEDICARE | Attending: Anesthesiology | Primary: Anesthesiology

## 2018-02-21 DIAGNOSIS — R1310 Dysphagia, unspecified: Principal | ICD-10-CM

## 2018-02-22 DIAGNOSIS — R1310 Dysphagia, unspecified: Principal | ICD-10-CM

## 2018-03-13 DIAGNOSIS — R0600 Dyspnea, unspecified: Principal | ICD-10-CM

## 2018-03-14 ENCOUNTER — Ambulatory Visit: Admit: 2018-03-14 | Discharge: 2018-03-18 | Disposition: A | Discharge status: Home Health | Payer: MEDICARE

## 2018-03-14 DIAGNOSIS — R0600 Dyspnea, unspecified: Principal | ICD-10-CM

## 2018-03-18 MED ORDER — AMOXICILLIN 250 MG-POTASSIUM CLAVULANATE 62.5 MG/5 ML ORAL SUSPENSION
Freq: Two times a day (BID) | ORAL | 0 refills | 0.00000 days | Status: CP
Start: 2018-03-18 — End: 2018-03-31

## 2018-03-18 MED ORDER — AMOXICILLIN 250 MG-POTASSIUM CLAVULANATE 62.5 MG/5 ML ORAL SUSPENSION: 800 mg | mL | Freq: Two times a day (BID) | 0 refills | 0 days | Status: AC

## 2018-03-22 ENCOUNTER — Ambulatory Visit
Admit: 2018-03-22 | Discharge: 2018-03-31 | Disposition: A | Discharge status: Home / Self Care | Payer: MEDICARE | Source: Ambulatory Visit | Attending: Family | Admitting: Critical Care Medicine

## 2018-03-22 ENCOUNTER — Ambulatory Visit
Admit: 2018-03-22 | Discharge: 2018-03-31 | Disposition: A | Discharge status: Home / Self Care | Payer: MEDICARE | Source: Ambulatory Visit | Admitting: Critical Care Medicine

## 2018-03-22 ENCOUNTER — Ambulatory Visit
Admit: 2018-03-22 | Discharge: 2018-03-31 | Disposition: A | Discharge status: Home / Self Care | Payer: MEDICARE | Source: Ambulatory Visit | Attending: Student in an Organized Health Care Education/Training Program | Admitting: Critical Care Medicine

## 2018-03-22 DIAGNOSIS — K224 Dyskinesia of esophagus: Principal | ICD-10-CM

## 2018-03-22 DIAGNOSIS — J42 Unspecified chronic bronchitis: Secondary | ICD-10-CM

## 2018-03-22 DIAGNOSIS — Z930 Tracheostomy status: Secondary | ICD-10-CM

## 2018-03-22 DIAGNOSIS — R0602 Shortness of breath: Secondary | ICD-10-CM

## 2018-03-22 DIAGNOSIS — Q8789 Other specified congenital malformation syndromes, not elsewhere classified: Secondary | ICD-10-CM

## 2018-03-22 DIAGNOSIS — Q320 Congenital tracheomalacia: Secondary | ICD-10-CM

## 2018-03-22 DIAGNOSIS — J3089 Other allergic rhinitis: Secondary | ICD-10-CM

## 2018-03-22 DIAGNOSIS — J45901 Unspecified asthma with (acute) exacerbation: Principal | ICD-10-CM

## 2018-03-22 DIAGNOSIS — J209 Acute bronchitis, unspecified: Principal | ICD-10-CM

## 2018-03-22 MED ORDER — MONTELUKAST 10 MG TABLET
ORAL_TABLET | Freq: Every evening | ORAL | 11 refills | 0.00000 days | Status: CP
Start: 2018-03-22 — End: 2019-03-22

## 2018-03-22 MED ORDER — BUDESONIDE 0.5 MG/2 ML SUSPENSION FOR NEBULIZATION
Freq: Two times a day (BID) | RESPIRATORY_TRACT | 11 refills | 0.00000 days | Status: CP
Start: 2018-03-22 — End: 2019-03-22

## 2018-03-22 MED ORDER — PREDNISONE 10 MG TABLET
ORAL_TABLET | 0 refills | 0 days | Status: SS
Start: 2018-03-22 — End: 2018-05-04

## 2018-03-23 DIAGNOSIS — J209 Acute bronchitis, unspecified: Principal | ICD-10-CM

## 2018-03-31 MED ORDER — PROMETHAZINE 6.25 MG/5 ML ORAL SYRUP
Freq: Four times a day (QID) | ORAL | 0 refills | 0.00000 days | Status: SS | PRN
Start: 2018-03-31 — End: 2018-03-31

## 2018-03-31 MED ORDER — LINEZOLID 600 MG TABLET
ORAL_TABLET | 0 refills | 0 days
Start: 2018-03-31 — End: 2018-07-23

## 2018-03-31 MED ORDER — CODEINE 10 MG-GUAIFENESIN 100 MG/5 ML ORAL LIQUID: 5 mL | mL | Freq: Three times a day (TID) | 0 refills | 0 days | Status: AC

## 2018-03-31 MED ORDER — ONDANSETRON 8 MG DISINTEGRATING TABLET: 8 mg | tablet | Freq: Three times a day (TID) | 0 refills | 0 days | Status: AC

## 2018-03-31 MED ORDER — ONDANSETRON 8 MG DISINTEGRATING TABLET
ORAL_TABLET | Freq: Three times a day (TID) | ORAL | 0 refills | 0.00000 days | Status: CP | PRN
Start: 2018-03-31 — End: 2018-03-31

## 2018-03-31 MED ORDER — LINEZOLID 100 MG/5 ML ORAL SUSPENSION
Freq: Two times a day (BID) | ORAL | 0 refills | 0 days | Status: SS
Start: 2018-03-31 — End: 2018-05-04

## 2018-03-31 MED ORDER — ONDANSETRON HCL 4 MG TABLET
ORAL_TABLET | Freq: Three times a day (TID) | GASTROSTOMY | 0 refills | 0.00000 days | Status: SS | PRN
Start: 2018-03-31 — End: 2018-05-04

## 2018-03-31 MED ORDER — CODEINE 10 MG-GUAIFENESIN 100 MG/5 ML ORAL LIQUID
Freq: Three times a day (TID) | ORAL | 0 refills | 0.00000 days | Status: CP | PRN
Start: 2018-03-31 — End: 2018-03-31

## 2018-03-31 MED ORDER — PROMETHAZINE 6.25 MG/5 ML ORAL SYRUP: 13 mg | mL | Freq: Four times a day (QID) | 0 refills | 0 days | Status: SS

## 2018-03-31 MED FILL — PROMETHAZINE HCL/6.25/5ML/SYRP: PROMETHAZINE HCL/6.25/5ML/SYRP | 3 days supply | Qty: 120 | Fill #0

## 2018-03-31 MED FILL — ONDANSETRON ODT/8MG ODT/TBDP: ONDANSETRON ODT/8MG ODT/TBDP | 5 days supply | Qty: 14 | Fill #0

## 2018-03-31 MED FILL — LINEZOLID/600MG/TABS: LINEZOLID/600MG/TABS | 5 days supply | Qty: 10 | Fill #0

## 2018-04-02 ENCOUNTER — Ambulatory Visit: Admit: 2018-04-02 | Discharge: 2018-04-02 | Discharge status: Against Medical Advice | Payer: MEDICARE

## 2018-04-02 ENCOUNTER — Encounter: Admit: 2018-04-02 | Discharge: 2018-04-02 | Discharge status: Against Medical Advice | Payer: MEDICARE

## 2018-04-02 DIAGNOSIS — R1032 Left lower quadrant pain: Secondary | ICD-10-CM

## 2018-04-02 DIAGNOSIS — R109 Unspecified abdominal pain: Secondary | ICD-10-CM

## 2018-04-02 DIAGNOSIS — M549 Dorsalgia, unspecified: Principal | ICD-10-CM

## 2018-04-02 MED ORDER — TAMSULOSIN 0.4 MG CAPSULE
ORAL_CAPSULE | Freq: Every day | ORAL | 0 refills | 0 days | Status: SS
Start: 2018-04-02 — End: 2018-05-04
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
## 2018-04-05 ENCOUNTER — Encounter
Admit: 2018-04-05 | Discharge: 2018-04-05 | Disposition: A | Discharge status: Home / Self Care | Payer: MEDICARE | Attending: Emergency Medicine

## 2018-04-05 DIAGNOSIS — R109 Unspecified abdominal pain: Principal | ICD-10-CM
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
## 2018-04-06 ENCOUNTER — Ambulatory Visit: Admit: 2018-04-06 | Discharge: 2018-04-06 | Payer: MEDICARE | Attending: Internal Medicine | Primary: Internal Medicine

## 2018-04-06 ENCOUNTER — Encounter: Admit: 2018-04-06 | Discharge: 2018-04-06 | Payer: MEDICARE

## 2018-04-06 DIAGNOSIS — J45901 Unspecified asthma with (acute) exacerbation: Secondary | ICD-10-CM

## 2018-04-06 DIAGNOSIS — J398 Other specified diseases of upper respiratory tract: Secondary | ICD-10-CM

## 2018-04-06 DIAGNOSIS — J479 Bronchiectasis, uncomplicated: Principal | ICD-10-CM

## 2018-04-06 DIAGNOSIS — D801 Nonfamilial hypogammaglobulinemia: Principal | ICD-10-CM

## 2018-04-06 DIAGNOSIS — J42 Unspecified chronic bronchitis: Secondary | ICD-10-CM

## 2018-04-06 MED ORDER — TOBRAMYCIN 300 MG/5 ML IN 0.225 % SODIUM CHLORIDE FOR NEBULIZATION: 300 mg | mL | Freq: Two times a day (BID) | 5 refills | 0 days | Status: AC

## 2018-04-06 MED ORDER — ARFORMOTEROL 15 MCG/2 ML SOLUTION FOR NEBULIZATION
Freq: Two times a day (BID) | RESPIRATORY_TRACT | 11 refills | 0.00000 days | Status: CP
Start: 2018-04-06 — End: 2019-04-06

## 2018-04-06 MED ORDER — ALBUTEROL SULFATE HFA 90 MCG/ACTUATION AEROSOL INHALER
Freq: Four times a day (QID) | RESPIRATORY_TRACT | 11 refills | 0.00000 days | Status: CP | PRN
Start: 2018-04-06 — End: 2019-04-06

## 2018-04-06 MED ORDER — TOBRAMYCIN 300 MG/5 ML IN 0.225 % SODIUM CHLORIDE FOR NEBULIZATION
Freq: Two times a day (BID) | RESPIRATORY_TRACT | 5 refills | 0.00000 days | Status: CP
Start: 2018-04-06 — End: 2019-04-06

## 2018-04-06 MED ORDER — BUDESONIDE-FORMOTEROL HFA 160 MCG-4.5 MCG/ACTUATION AEROSOL INHALER
Freq: Two times a day (BID) | RESPIRATORY_TRACT | 11 refills | 0 days | Status: CP
Start: 2018-04-06 — End: ?

## 2018-04-07 LAB — TOTAL IGE: Lab: 110

## 2018-04-07 NOTE — Unmapped (Signed)
[facility] Medication Referral: No PA required    Medication (Brand/Generic): TOBRAMYCIN 300MG /5ML SUSP    Initial FSI Test Claim completed with resulted information below:  No PA required  Patient ABLE to fill at [facility]  Insurance Company:  [employer] (SPLIT BILL TO MEDICAID)  Anticipated Copay: $0    As Co-pay is under $100 defined limit, per policy there will be no further investigation of need for financial assistance at this time unless patient requests. This referral has been communicated to the provider and handed off to the [facility] team for further processing and filling of prescribed medication.   ______________________________________________________________________  Please utilize this referral for viewing purposes as it will serve as the central location for all relevant documentation and updates.

## 2018-04-07 NOTE — Unmapped (Signed)
[facility] Services: On-boarding Note  Pulmonary Specialty: Adult Pulmonary (General)     Cameron Baker is enrolling with [facility] and coordination of first medication shipment.    Apr 07, 2018 11:53 AM  LVM for Cameron Baker to call CPP back re: on-boarding of 1st fill of inhaled tobramycin nebs from [facility]    Apr 08, 2018 10:17 AM  Spoke to Cameron Baker and he does not need another refill until his next cycle mid-June. Has been on inhaled tobramycin chronically and just switching specialty pharmacies. Provided him [facility] contact #.  _______________________________________________________________________________________    Patient Info/Demographics:  DOB: November 24, 1991  Phone: [phone] (home)   Confirmed Shipping address: [address], [address]  [place] [address]    Specialty Medication(s) to be sent:   -Tobramycin solution for inhalation 300mg /46mL    PARI neb cups dispensed: 1     FINANCIAL:  Primary Billing: -Medicare Part D  Anticipated Copay: $0     SHIPPING:   Scheduled Delivery Date: Not scheduled yet from [facility]   Signature Required: Yes     ______________________________________________________________________  Specialty Medication Counseling Provided to Patient:    Generic tobramycin for inhalation, 300mg /40mL)  Used to treat people with who have a bacterial infection in their lungs called  Pseudomonas aeruginosa.      Medication & Administration     Confirmed the medication and dosage: Generic tobramycin 300mg /48mL: Inhale 1 ampule (300mg ) via nebulizer every 12 hours cycled 28 days on and 28 days off.     Administration:   ? Inhale contents of ampule sitting or standing upright and breathing normally through the mouthpiece of the nebulizer until there is no longer any mist being produced.    ? Usually last medication taken when on several inhaled therapies.    Missed dose instruction:   ? If you miss a dose of Tobramycin Inhalation and it is 6 hours or less from the time you usually take your dose, then take your dose as soon as you can, then resume your next dose at the usual time. Otherwise skip the dose, and resume at your next scheduled dose.    Instructions for storage:   ? Store in the refrigerator.  ? May be stored at room temperature for up to 28 days.    Common Side Effects     ? Voice alterations,  loss of voice  ? Throat irritation  ? Bronchospasm     Warning and Precautions     Ototoxicity: If you experience tinnitus (ringing of the ears) or hearing loss with tobramycin inhalation, please contact your provider.  ______________________________________________________________________  Provided the [facility] pharmacy contact [phone], option 4    Answered all patient questions.  [facility] team will follow-up with patient monthly for standard refill processing and delivery of specialty medication.    Initiation of Therapy Assessment:   -Complete medication list, PMH, and allergies were reviewed.  No issues were identified  -Relevant cultural assessment and health literacy was completed.    -Patient is able to store his/her medication as directed  -This patient does not have any cognitive or physical disabilities   -This patient does not requires interpreter services.    The following was explained to the patient:  -A Welcome packet will be sent to the patient   -Copay or out-of-pocket responsibility  -Arrangement of payment method can be done by contacting the pharmacy  -Take medications with during travel, have  doctor's appointments, or if being admitted to the hospital.  -The patient will receive an FSI print out for each medication shipped and additional FDA Medication Guides as required.  Patient education from [employer] may also be included in the shipment.    Advised patient of refill order process:  -Emphasized need for patient to be reachable in order to schedule medication shipment.  -Pharmacy must speak to the patient every month to receive specialty medication  -Patient may call the pharmacy, or the pharmacy will call about a week before the refill is due      Next RF coordination date: 05/11/18    Anell Barr, PharmD, BCPS, CPP  Clinical Pharmacist Practitioner  [facility]  [facility]  Pager: [phone]

## 2018-04-07 NOTE — Unmapped (Signed)
Blood cx x 2 (#[id number] and #[id number]) remains with no growth at 48 hours.

## 2018-04-08 MED ORDER — NEBULIZERS
11 refills | 0 days | Status: CP
Start: 2018-04-08 — End: ?

## 2018-04-08 NOTE — Unmapped (Signed)
Blood cx x 2 (#[id number] and #[id number]) remains with no growth at 72 hours.

## 2018-04-09 NOTE — Unmapped (Signed)
Blood cx x 2 (#[id number] and #[id number]) remains with pending results of no growth at 4 days.

## 2018-04-12 NOTE — Unmapped (Signed)
Final blood cx x 2 (#[id number] and #[id number]) resulted of no growth at 5 days.

## 2018-04-16 ENCOUNTER — Encounter: Admit: 2018-04-16 | Discharge: 2018-04-17 | Payer: MEDICARE

## 2018-04-16 DIAGNOSIS — L700 Acne vulgaris: Secondary | ICD-10-CM

## 2018-04-16 DIAGNOSIS — N200 Calculus of kidney: Secondary | ICD-10-CM

## 2018-04-16 DIAGNOSIS — M76891 Other specified enthesopathies of right lower limb, excluding foot: Secondary | ICD-10-CM

## 2018-04-16 DIAGNOSIS — E039 Hypothyroidism, unspecified: Principal | ICD-10-CM

## 2018-04-16 MED ORDER — IBUPROFEN 600 MG TABLET: 600 mg | tablet | Freq: Four times a day (QID) | 1 refills | 0 days

## 2018-04-16 MED ORDER — CLINDAMYCIN 1 % TOPICAL GEL
Freq: Two times a day (BID) | TOPICAL | 6 refills | 0.00000 days | Status: CP
Start: 2018-04-16 — End: 2018-07-23

## 2018-04-16 MED ORDER — DICLOFENAC 1 % TOPICAL GEL
Freq: Four times a day (QID) | TOPICAL | 6 refills | 0 days | Status: CP
Start: 2018-04-16 — End: 2018-07-23

## 2018-04-16 MED ORDER — MINOCYCLINE 100 MG CAPSULE: 100 mg | capsule | Freq: Every evening | 3 refills | 0 days | Status: AC

## 2018-04-16 MED ORDER — IBUPROFEN 600 MG TABLET
ORAL_TABLET | Freq: Four times a day (QID) | GASTROSTOMY | 1 refills | 0.00000 days | Status: CP | PRN
Start: 2018-04-16 — End: 2018-04-16

## 2018-04-16 MED ORDER — LEVOTHYROXINE 75 MCG TABLET: 75 ug | tablet | Freq: Every day | 3 refills | 0 days

## 2018-04-16 MED ORDER — LEVOTHYROXINE 75 MCG TABLET
ORAL_TABLET | Freq: Every day | GASTROSTOMY | 3 refills | 0.00000 days
Start: 2018-04-16 — End: 2018-04-16

## 2018-04-16 MED ORDER — MINOCYCLINE 100 MG CAPSULE
ORAL_CAPSULE | Freq: Every evening | GASTROSTOMY | 3 refills | 0.00000 days | Status: CP
Start: 2018-04-16 — End: 2018-07-23

## 2018-04-16 NOTE — Unmapped (Signed)
Order for pulmonary rehab has been faxed to [facility] as requested, [phone].  [facility] LPN

## 2018-04-16 NOTE — Unmapped (Signed)
Clinic notes E-routed to [facility].
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
## 2018-04-19 NOTE — Unmapped (Signed)
Fax from [facility] placed in Dr Tollie Eth box

## 2018-04-20 ENCOUNTER — Encounter: Admit: 2018-04-20 | Discharge: 2018-04-21 | Disposition: A | Discharge status: Home / Self Care | Payer: MEDICARE

## 2018-04-20 DIAGNOSIS — M25551 Pain in right hip: Principal | ICD-10-CM

## 2018-04-20 MED ORDER — MELOXICAM 7.5 MG TABLET
ORAL_TABLET | Freq: Every day | ORAL | 0 refills | 0.00000 days | Status: CP
Start: 2018-04-20 — End: 2018-05-12

## 2018-04-21 NOTE — Unmapped (Signed)
[name] from [facility], they have approval to get Cameron Baker his DME supplies, but they can not locate [name].    [facility] needs to do a home inspection before supplying equipment.  They tried multiple addresses.
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
## 2018-04-21 NOTE — Unmapped (Signed)
Pt was walking and felt a pop in his right hip. Pt having pain and pt states that he has surgery on that hip 2 years ago

## 2018-04-22 NOTE — Unmapped (Signed)
Patient is calling back to check on the status of this request.

## 2018-04-22 NOTE — Unmapped (Signed)
Patient will need an appointment for refill of this medication.    Thank You Dianca Owensby

## 2018-04-22 NOTE — Unmapped (Signed)
Hi  He was just seen 04-16-18. Does he need another appt?

## 2018-04-23 ENCOUNTER — Encounter: Admit: 2018-04-23 | Discharge: 2018-04-23 | Payer: MEDICARE

## 2018-04-23 MED ORDER — PROMETHAZINE 6.25 MG/5 ML ORAL SYRUP
Freq: Four times a day (QID) | ORAL | 0 refills | 0 days | Status: SS | PRN
Start: 2018-04-23 — End: 2018-05-20

## 2018-04-24 NOTE — Unmapped (Signed)
Ordered promethazine syrup.    Cameron Baker

## 2018-04-27 LAB — STREP PNEUMONIAE ANTIBODY SEROTYPES
IGG SEROTYPE 14: 0.9 ug/mL
IGG SEROTYPE 18C: 0.8 ug/mL
IGG SEROTYPE 19F: 14.3 ug/mL
IGG SEROTYPE 4: 7.4 ug/mL
PNEUMOCOCCAL IGG SEROTYPE 10A: 1.8 ug/mL
PNEUMOCOCCAL IGG SEROTYPE 11A: 5.2 ug/mL
PNEUMOCOCCAL IGG SEROTYPE 12F: 0.6 ug/mL
PNEUMOCOCCAL IGG SEROTYPE 15B: <0.4 ug/mL
PNEUMOCOCCAL IGG SEROTYPE 17F: 2 ug/mL
PNEUMOCOCCAL IGG SEROTYPE 19A: 28.3 ug/mL
PNEUMOCOCCAL IGG SEROTYPE 20: <0.4 ug/mL
PNEUMOCOCCAL IGG SEROTYPE 22F: 3.1 ug/mL
PNEUMOCOCCAL IGG SEROTYPE 2: 0.5 ug/mL
PNEUMOCOCCAL IGG SEROTYPE 33F: 0.4 ug/mL

## 2018-04-27 LAB — IGG SEROTYPE 4: Streptococcus pneumoniae Danish serotype 4 Ab.IgG:MCnc:Pt:Ser:Qn:: 7.4

## 2018-04-27 MED ORDER — CODEINE 10 MG-GUAIFENESIN 100 MG/5 ML ORAL LIQUID
Freq: Three times a day (TID) | ORAL | 0 refills | 0 days | Status: SS | PRN
Start: 2018-04-27 — End: 2018-05-07

## 2018-04-28 ENCOUNTER — Emergency Department: Admit: 2018-04-28 | Discharge: 2018-04-28 | Disposition: A | Discharge status: Home / Self Care | Payer: MEDICARE

## 2018-04-28 ENCOUNTER — Encounter: Admit: 2018-04-28 | Discharge: 2018-04-28 | Disposition: A | Discharge status: Home / Self Care | Payer: MEDICARE

## 2018-04-28 DIAGNOSIS — S99912A Unspecified injury of left ankle, initial encounter: Principal | ICD-10-CM

## 2018-04-28 NOTE — Unmapped (Signed)
Patient rounds completed. The following patient needs were addressed:  Pain, Toileting, Personal Belongings, Plan of Care, Call Bell in Reach and Bed Position Low .

## 2018-04-28 NOTE — Unmapped (Signed)
Pt BIB [facility], rolled L ankle earlier today, since then it has begun to swell. Pt has complex medical hx at baseline. NAD on arrival

## 2018-04-28 NOTE — Unmapped (Addendum)
Pt to xray. NAD noted.

## 2018-04-28 NOTE — Unmapped (Signed)
Air cast applied with no difficulties.

## 2018-04-28 NOTE — Unmapped (Signed)
[facility]  Emergency Department Provider Note    ED Clinical Impression     Final diagnoses:   Sprain of left ankle, unspecified ligament, initial encounter (Primary)       Initial Impression, ED Course, Assessment and Plan     Impression: Medically complex male presenting after a SER mechanism ankle injury.  No deformity noted about the ankle.  Patient was able to ambulate immediately after however developed increasing pain likely secondary to swelling.  Will obtain ankle films as well as left foot, knee films as well.  Patient also presenting with secondary complaint of increased shortness of breath.  Will obtain chest x-ray.  Patient has discussed this with pulmonary provider here at [facility] who called a decongestant today.  Patient is otherwise not in acute distress, has a normal respiratory rate, no O2 requirement, no wheezing on auscultation.  He does have known bronchiectasis.  Will obtain plain film of the chest and reevaluate patient with likely discharge anticipated.    04:27 pain films reviewed awaiting final radiology interpretation however no fracture, dislocation visualized at the ankle, foot, knee.    04:45 radiology interpretation reviewed, agree with no fracture or dislocation.  Chest x-ray is unremarkable and otherwise reassuring.  Will offer Aircast for symptomatic treatment of pain.  Will discuss rest, ice, elevation.  We will also discussed the natural history of soft tissue injury that these issues tend to take a long time to resolve.  He should follow-up with his orthopedic surgeons if he continues to have ankle pain.  With respect to his shortness of breath no acute intervention is required, would have him follow-up with his pulmonologist in the morning if symptoms progress.    Additional Medical Decision Making     I have reviewed the vital signs and the nursing notes. Labs and radiology results that were available during my care of the patient were independently reviewed by me and considered in my medical decision making.     I staffed the case with the ED attending, Dr. Laurell Josephs.  I independently visualized the radiology images.   I reviewed the patient's prior medical records.     Portions of this record have been created using Scientist, clinical (histocompatibility and immunogenetics). Dictation errors have been sought, but may not have been identified and corrected.  ____________________________________________       History     Chief Complaint  Ankle Pain      HPI   Keymari Sato is a 27 y.o. male who presents same night after falling in a pothole while inebriated.  Patient reports foot supinated externally rotated, had sudden onset of pain however was able to ambulate medially after.  Patient now reporting pain is severe.  Reports frequently rolls ankle.  Patient also citing that he has increased shortness of breath that he is pending medication with his pulmonologist about.  No new O2 requirement.    Past Medical History:   Diagnosis Date   ??? ADHD    ??? Asthma    ??? Bipolar 1 disorder (CMS-HCC)    ??? Chronic tracheobronchitis (CMS-HCC)    ??? Constipation    ??? De Quervain's tenosynovitis    ??? Dysphagia    ??? Esophageal dysmotility    ??? Esophageal stricture    ??? GERD (gastroesophageal reflux disease)    ??? hypothyroid    ??? Seizures (CMS-HCC)    ??? Tracheobronchomalacia    ??? VATER syndrome    ??? Ventilator dependence (CMS-HCC)  Patient Active Problem List   Diagnosis   ??? Asthma   ??? Attention deficit disorder   ??? Slow transit constipation   ??? Dysphagia   ??? Esophageal reflux   ??? Hypothyroidism   ??? Class 1 obesity in adult   ??? Seizures (CMS-HCC)   ??? Gastrostomy status (CMS-HCC)   ??? Stricture and stenosis of esophagus   ??? Post traumatic stress disorder (PTSD)   ??? Mood disorder (CMS-HCC)   ??? Personality disorder (CMS-HCC)   ??? Chronic tracheobronchitis (CMS-HCC)   ??? Congenital tracheomalacia   ??? Tracheostomy dependence (CMS-HCC)   ??? VATER syndrome   ??? Muscle spasms of both lower extremities   ??? Pain and swelling of right wrist ??? Hypogammaglobulinemia (CMS-HCC)   ??? Coronavirus infection   ??? Triangular fibrocartilage complex injury, left, subsequent encounter   ??? Enthesopathy of right hip region   ??? Dyspnea       Past Surgical History:   Procedure Laterality Date   ??? ANAL DILATION  01/21/2006   ??? ESOPHAGOGASTRODUODENOSCOPY      multiple   ??? HIP SURGERY Right 01/19/2017    shaved   ??? LATERAL RECTUS RECESSION  11/15/2003   ??? NISSEN FUNDOPLICATION  2001    x 2 redos   ??? PORTACATH PLACEMENT  01/21/2006    taken out same year   ??? PR ESOPHAGEAL MOTILITY STUDY, MANOMETRY N/A 04/23/2018    Procedure: ESOPHAGEAL MOTILITY STUDY W/INT & REP;  Surgeon: Nurse-Based Giproc;  [facility];  Service: Gastroenterology   ??? PR UP GI ENDOSCOPY,BALL DIL,30MM N/A 02/22/2018    Procedure: UGI ENDO; W/BALLOON DILAT ESOPHAGUS (<30MM DIAM);  Surgeon: Zetta Bills, MD;  Location: [facility];  Service: Gastroenterology   ??? REMOVAL VENOUS ACCESS PORT [facility] HISTORICAL RESULT)  11/03/2006   ??? STRABISMUS SURGERY Right    ??? TRACHEOSTOMY  1994   ??? WISDOM TOOTH EXTRACTION  04/2012   ??? WRIST SURGERY      3 times.        No current facility-administered medications for this encounter.     Current Outpatient Medications:   ???  acetaminophen (TYLENOL) 160 mg/5 mL (5 mL) suspension, 15.6 mL (500 mg total) by G-tube route every four (4) hours as needed for pain., Disp: 1 Bottle, Rfl: 0  ???  albuterol 2.5 mg /3 mL (0.083 %) nebulizer solution, Inhale 6 mL by nebulization 4 (four) times a day as needed for wheezing., Disp: , Rfl: 0  ???  albuterol HFA 90 mcg/actuation inhaler, Inhale 2 puffs every six (6) hours as needed for wheezing., Disp: 1 Inhaler, Rfl: 11  ???  arformoterol (BROVANA), Inhale 2 mL (15 mcg total) by nebulization Two (2) times a day., Disp: 120 mL, Rfl: 11  ???  azithromycin (ZITHROMAX) 500 MG tablet, 1 tablet (500 mg total) by G-tube route 3 (three) times a week., Disp: 12 tablet, Rfl: 11  ???  budesonide (PULMICORT) 0.5 mg/2 mL nebulizer solution, Inhale 4 mL (1 mg total) by nebulization Two (2) times a day., Disp: 240 mL, Rfl: 11  ???  budesonide-formoterol (SYMBICORT) 160-4.5 mcg/actuation inhaler, Inhale 2 puffs Two (2) times a day., Disp: 1 Inhaler, Rfl: 11  ???  cetirizine (ZYRTEC) 10 MG tablet, 1 tablet (10 mg total) by G-tube route every evening., Disp: , Rfl: 0  ???  cholecalciferol, vitamin D3, (VITAMIN D3) 2,000 unit cap, 3 capsules (6,000 Units total) by G-tube route daily., Disp: , Rfl: 0  ???  citalopram (CELEXA)  40 MG tablet, 1 tablet (40 mg total) by G-tube route nightly., Disp: 30 tablet, Rfl: 2  ???  clindamycin (CLINDAGEL) 1 % gel, Apply topically Two (2) times a day., Disp: 60 g, Rfl: 6  ???  codeine-guaifenesin (GUAIFENESIN AC) 10-100 mg/5 mL liquid, Take 5 mL by mouth Three (3) times a day as needed for cough. for up to 5 days, Disp: 118 mL, Rfl: 0  ???  diclofenac sodium (VOLTAREN) 1 % gel, Apply 2 g topically Four (4) times a day., Disp: 100 g, Rfl: 6  ???  dicyclomine (BENTYL) 10 mg capsule, 10 mg by G-tube route 4 (four) times a day as needed. , Disp: , Rfl:   ???  fluticasone (FLONASE) 50 mcg/actuation nasal spray, 2 sprays by Each Nare route daily. , Disp: , Rfl:   ???  gabapentin (NEURONTIN) 300 MG capsule, 300 mg by G-tube route Three (3) times a day., Disp: , Rfl:   ???  ibuprofen (ADVIL,MOTRIN) 600 MG tablet, 1 tablet (600 mg total) by G-tube route every six (6) hours as needed for pain., Disp: 30 tablet, Rfl: 1  ???  ipratropium/albuterol sulfate (DUONEB INHL), Inhale 1 vial two (2) times a day as needed., Disp: , Rfl:   ???  levothyroxine (SYNTHROID, LEVOTHROID) 75 MCG tablet, 1 tablet (75 mcg total) by G-tube route daily., Disp: 90 tablet, Rfl: 3  ???  linaclotide (LINZESS) 290 mcg capsule, 1 capsule (290 mcg total) by G-tube route daily., Disp: 30 capsule, Rfl: 0  ???  LORazepam (ATIVAN) 0.5 MG tablet, Take 1 tablet (0.5mg ) twice daily and 1 tablet (0.5mg ) daily as needed for anxiety via G-tube, Disp: 70 tablet, Rfl: 2  ???  meloxicam (MOBIC) 7.5 MG tablet, Take 1 tablet (7.5 mg total) by mouth daily. for 15 days, Disp: 15 tablet, Rfl: 0  ???  minocycline (MINOCIN,DYNACIN) 100 MG capsule, 1 capsule (100 mg total) by G-tube route nightly., Disp: 90 capsule, Rfl: 3  ???  mirtazapine (REMERON) 15 MG tablet, 1 tablet (15 mg total) by G-tube route nightly., Disp: 30 tablet, Rfl: 2  ???  montelukast (SINGULAIR) 10 mg tablet, Take 1 tablet (10 mg total) by mouth nightly., Disp: 30 tablet, Rfl: 11  ???  omeprazole (PRILOSEC) 20 MG capsule, 20 mg Two (2) times a day (30 minutes before a meal). Via G-tube, Disp: , Rfl:   ???  ondansetron (ZOFRAN) 4 MG tablet, 1 tablet (4 mg total) by G-tube route every eight (8) hours as needed for nausea., Disp: 20 tablet, Rfl: 0  ???  oxyCODONE (ROXICODONE) 5 MG immediate release tablet, 5 mg by G-tube route every four (4) hours as needed for pain. , Disp: , Rfl:   ???  PARI LC D NEBULIZER Misc, Provide 1 device  for use with inhaled medication., Disp: 2 each, Rfl: 11  ???  predniSONE (DELTASONE) 10 MG tablet, Take 40mg  (4 tabs) daily x5 day, then 30mg  (3 tabs) daily x5 day, then 20mg  (2 tabs) daily x 5 day, then 10mg  (1 tab) daily x5 day, then off, Disp: 50 tablet, Rfl: 0  ???  promethazine (PHENERGAN) 6.25 mg/5 mL syrup, Take 5 mL (6.25 mg total) by mouth 4 (four) times a day as needed for nausea. for up to 7 days, Disp: 120 mL, Rfl: 0  ???  sodium chloride 7% 7 % Nebu, Inhale 4 mL by nebulization 4 (four) times a day. (Patient taking differently: Inhale 4 mL by nebulization 2 (two) times a day. ), Disp: 480 mL, Rfl:  0  ???  tiZANidine (ZANAFLEX) 4 MG tablet, 1 tablet (4 mg total) by G-tube route Three (3) times a day as needed. (Patient taking differently: 4 mg by G-tube route two (2) times a day as needed. ), Disp: 30 tablet, Rfl: 0  ???  tobramycin, PF, (TOBI) 300 mg/5 mL nebulizer solution, Inhale 5 mL (300 mg total) by nebulization every twelve (12) hours. Every other month, Disp: 280 mL, Rfl: 5  ???  triamcinolone (KENALOG) 0.1 % cream, Apply topically Two (2) times a day., Disp: 15 g, Rfl: 11  ???  valproate (DEPAKENE) 250 mg/5 mL syrup, TAKE BY G-TUBE EVERY MORNING AND EVERY EVENING, Disp: 1850 mL, Rfl: 0    Allergies  Xopenex [levalbuterol hcl]; Allopurinol analogues; and Versed [midazolam]    Family History   Adopted: Yes       Social History  Social History     Tobacco Use   ??? Smoking status: Never Smoker   ??? Smokeless tobacco: Never Used   Substance Use Topics   ??? Alcohol use: Yes     Alcohol/week: 1.2 oz     Types: 2 Cans of beer per week     Comment: on weekends sometimes   ??? Drug use: No       Review of Systems  All other systems have been reviewed and are negative except as otherwise documented.  Constitutional: Negative for fever.  Eyes: Negative for visual changes.  ENT: Negative for sore throat.  Cardiovascular: Negative for chest pain.  Respiratory: Negative for shortness of breath.  Gastrointestinal: Negative for abdominal pain, vomiting or diarrhea.    Physical Exam     ED Triage Vitals   Enc Vitals Group      BP       Pulse       SpO2 Pulse       Resp       Temp       Temp src       SpO2       Weight       Height       Head Circumference       Peak Flow       Pain Score       Pain Loc       Pain Edu?       Excl. in GC?      Constitutional: Alert and oriented. Well appearing and in no distress.  Eyes: Conjunctivae are normal.  ENT       Head: Normocephalic and atraumatic.       Nose: No congestion.       Mouth/Throat: Mucous membranes are moist.       Neck: No stridor.  Hematological/Lymphatic/Immunilogical: No cervical lymphadenopathy.  Cardiovascular: Normal rate, regular rhythm. Normal and symmetric distal pulses are present in all extremities.  Respiratory: Trach collar in place.  Normal respiratory effort, rate.  No increased secretions.  Patient has increased breath sounds however no wheezing, crackles appreciated.  Gastrointestinal: Soft and nontender.  Musculoskeletal: Unable to bear weight on the left lower extremity secondary to pain.  There is mild to moderate swelling about the left ankle.  No ecchymosis, tenting of skin.  No deformity noted.  Patient has sensation intact to light touch in the sural, saphenous, deep peroneal, superficial peroneal distributions.  2+ DP pulses.  Neurologic: Normal speech and language. No gross focal neurologic deficits are appreciated.  Skin: Skin is warm, dry and intact. No rash  noted.  Psychiatric: Mood and affect are normal. Speech and behavior are normal.    Radiology     EXAM: XR ANKLE 3 OR MORE VIEWS LEFT  DATE: 04/28/2018 4:31 AM  ACCESSION: [id number]  DICTATED: 04/28/2018 4:37 AM  INTERPRET[facility] LOCATION: [facility]    CLINICAL INDICATION: 27 years old Male with ankle pain- ??    COMPARISON: Concurrent foot radiographs.    TECHNIQUE: AP, oblique and lateral views of the left ankle.    FINDINGS:   The ankle mortise is intact. The talar dome is smooth. No acute fracture is evident. Mild soft tissue swelling about the ankle. Os trigonum. No radiopaque foreign body or subcutaneous emphysema.      Impression     --No fracture or dislocation of the left ankle.        EXAM: CHEST TWO VIEW  DATE: 04/28/18 at 04:04  ACCESSION: [id number]  DICTATED: 04/28/2018 4:31 AM  INTERPRETATION LOCATION: [facility]    CLINICAL INDICATION: 27 years old Male with OTHER- subjective SOB- ??    COMPARISON: 03/22/2018    TECHNIQUE:PA and lateral views of the chest.    FINDINGS:    Tracheostomy cannula is unchanged.    The lungs are well-inflated. No focal airspace opacity.     No pleural effusion or pneumothorax.    The cardiomediastinal silhouette is prominent but unchanged accounting for difference in technique.    No acute osseous abnormality.       Impression     --No acute airspace disease.      EXAM: XR FOOT 3 OR MORE VIEWS LEFT  DATE: 04/28/2018 4:32 AM  ACCESSION: [id number]  DICTATED: 04/28/2018 4:32 AM  INTERPRETATION LOCATION: [facility]    CLINICAL INDICATION: 27 years old Male with truama- COMPARISON: None.    TECHNIQUE: Dorsoplantar, oblique and lateral views of the left foot.    FINDINGS:   No fracture is evident. Joint alignment is normal. Joint spaces are preserved. Os naviculare. No radiopaque foreign body or subcutaneous emphysema. Comment soft tissue swelling along the plantar aspect of the foot.      Impression     --No fracture or dislocation of the left foot.        EXAM: XR KNEE 1 OR 2 VIEWS LEFT  DATE: 04/28/2018 4:32 AM  ACCESSION: [id number]  DICTATED: 04/28/2018 4:33 AM  INTERPRETATION LOCATION: [facility]    CLINICAL INDICATION: 27 years old Male with trauma- ??    COMPARISON: Right knee radiographs 12/19/2017.    TECHNIQUE: AP and crosstable lateral views of the left knee.    FINDINGS:   The knee is approximated. No fracture. No lipohemarthrosis. No radiopaque foreign body or soft tissue emphysema.      Impression     --No fracture or dislocation of the left knee.          Donetta Potts, MD  Resident  04/28/18 [phone]

## 2018-04-29 DIAGNOSIS — R0981 Nasal congestion: Principal | ICD-10-CM

## 2018-04-29 DIAGNOSIS — R05 Cough: Secondary | ICD-10-CM

## 2018-04-30 ENCOUNTER — Ambulatory Visit: Admit: 2018-04-30 | Discharge: 2018-04-30 | Disposition: A | Discharge status: Home / Self Care | Payer: MEDICARE

## 2018-04-30 ENCOUNTER — Encounter: Admit: 2018-04-30 | Discharge: 2018-04-30 | Disposition: A | Discharge status: Home / Self Care | Payer: MEDICARE

## 2018-04-30 LAB — COMPREHENSIVE METABOLIC PANEL
ALBUMIN: 4.7 g/dL (ref 3.5–5.0)
ALKALINE PHOSPHATASE: 73 U/L (ref 38–126)
ALT (SGPT): 53 U/L (ref 19–72)
ANION GAP: 14 mmol/L (ref 9–15)
AST (SGOT): 32 U/L (ref 19–55)
BILIRUBIN TOTAL: 1.1 mg/dL (ref 0.0–1.2)
BLOOD UREA NITROGEN: 11 mg/dL (ref 7–21)
BUN / CREAT RATIO: 14
CHLORIDE: 100 mmol/L (ref 98–107)
CO2: 25 mmol/L (ref 22.0–30.0)
CREATININE: 0.76 mg/dL (ref 0.70–1.30)
EGFR MDRD NON AF AMER: >=60 mL/min/{1.73_m2} (ref >=60)
GLUCOSE RANDOM: 93 mg/dL (ref 65–179)
POTASSIUM: 4 mmol/L (ref 3.5–5.0)
PROTEIN TOTAL: 7.4 g/dL (ref 6.5–8.3)
SODIUM: 139 mmol/L (ref 135–145)

## 2018-04-30 LAB — CBC W/ AUTO DIFF
BASOPHILS ABSOLUTE COUNT: 0 10*9/L (ref 0.0–0.1)
BASOPHILS RELATIVE PERCENT: 0.4 %
EOSINOPHILS ABSOLUTE COUNT: 0.1 10*9/L (ref 0.0–0.4)
EOSINOPHILS RELATIVE PERCENT: 0.7 %
HEMATOCRIT: 42.9 % (ref 41.0–53.0)
LARGE UNSTAINED CELLS: 2 % (ref 0–4)
LYMPHOCYTES ABSOLUTE COUNT: 2 10*9/L (ref 1.5–5.0)
LYMPHOCYTES RELATIVE PERCENT: 21.7 %
MEAN CORPUSCULAR HEMOGLOBIN CONC: 32.4 g/dL (ref 31.0–37.0)
MEAN CORPUSCULAR HEMOGLOBIN: 24.9 pg — ABNORMAL LOW (ref 26.0–34.0)
MEAN CORPUSCULAR VOLUME: 76.9 fL — ABNORMAL LOW (ref 80.0–100.0)
MEAN PLATELET VOLUME: 7.5 fL (ref 7.0–10.0)
MONOCYTES ABSOLUTE COUNT: 0.6 10*9/L (ref 0.2–0.8)
MONOCYTES RELATIVE PERCENT: 6.8 %
NEUTROPHILS RELATIVE PERCENT: 68.7 %
PLATELET COUNT: 429 10*9/L (ref 150–440)
RED BLOOD CELL COUNT: 5.58 10*12/L (ref 4.50–5.90)
RED CELL DISTRIBUTION WIDTH: 14.3 % (ref 12.0–15.0)
WBC ADJUSTED: 9.4 10*9/L (ref 4.5–11.0)

## 2018-04-30 LAB — BASOPHILS ABSOLUTE COUNT: Lab: 0

## 2018-04-30 LAB — CALCIUM: Calcium:MCnc:Pt:Ser/Plas:Qn:: 10

## 2018-04-30 MED ORDER — DOXYCYCLINE HYCLATE 100 MG CAPSULE
ORAL_CAPSULE | Freq: Two times a day (BID) | ORAL | 0 refills | 0 days | Status: SS
Start: 2018-04-30 — End: 2018-05-07

## 2018-04-30 NOTE — Unmapped (Signed)
Pt here for c/o of SOB, congestion, and increased use of nebs, recommended eval by pulmonologist. Janina Mayo with passy muir valve

## 2018-04-30 NOTE — Unmapped (Signed)
Pt back from xray. NAD noted. Labs to be collected.

## 2018-04-30 NOTE — Unmapped (Signed)
Taxi voucher given to pt.   resp collected culture and sent to lab.

## 2018-04-30 NOTE — Unmapped (Signed)
Patient rounds completed. The following patient needs were addressed:  Pain, Toileting, Personal Belongings, Plan of Care, Call Bell in Reach and Bed Position Low .

## 2018-04-30 NOTE — Unmapped (Signed)
Completed forms, left at unit clerk desk for faxing.     Cameron Baker

## 2018-04-30 NOTE — Unmapped (Signed)
Form faxed to Serita Sheller and confirmation received

## 2018-04-30 NOTE — Unmapped (Signed)
Pt suctioned for Lower Respiratory Culture via tracheostomy tube using 66fr suction catheter.  Pt tolerated well.  Post-sxn SpO2=98% on room air, HR=98, RR=18.

## 2018-04-30 NOTE — Unmapped (Signed)
Patient transported to X-ray  Transported by Radiology  How tranported Stretcher  Cardiac Monitor no

## 2018-04-30 NOTE — Unmapped (Signed)
Patient Advice Request - Ancillary Staff [facility] Name and relation (if other than patient): [facility] / Lavina Hamman  Question/Concern for provider (Specific): Vernona Rieger is calling to track forms that were placed in the provider's box on 04/19/2018  Best Contact Time: [phone]  Other info: Forms - Plan Of Care & Addendum

## 2018-04-30 NOTE — Unmapped (Signed)
Form completed and faxed. 

## 2018-04-30 NOTE — Unmapped (Signed)
Preliminary lower respiratory culture #[id number] (tracheal aspirate) report is pending.  Gram stain resulted <10 ??Epithelial cells per low power field, <10 ??PMNS/LPF, 1+ Gram negative bacilli and 1+ Gram positive bacilli.  Pt was discharged with a prescription for doxycycline (VIBRAMYCIN) 100 MG capsule, Take 1 capsule (100 mg total) by mouth Two (2) times a day for 10 days.

## 2018-04-30 NOTE — Unmapped (Signed)
D/c paperwork reviewed with pt.   Waiting for resp. To obtain culture from trach.   Pt asking for taxi voucher. Will speak with CN

## 2018-04-30 NOTE — Unmapped (Signed)
Received page after hours. He reports increase cough starting a few days ago. Started taking leftover prednisone 2 days ago. Was seen in ED yesterday early morning for ankle sprain. Complained of cough but CXR clear. He did not contact the office or his primary pulmonologist yesterday or today despite worsening symptoms. He now complains of copious tracheal secretions with need for frequent suctioning. Despite aggressive airway clearance he has worsened. No fevers. Mentions friend has been sick and thinks he has same infection. Offered a course of oral antibiotics but he will be unable to get them until the morning. He feels he needs to be seen tonight and thinks he needs to go to ED. I informed him that I would not ever disagree with someone who felt ill enough to go to the ED.     Recommend obtaining sputum culture, respiratory pathogen panel. Further care as determined by ED provider.

## 2018-04-30 NOTE — Unmapped (Signed)
Pt verbalizes no transportation and no one to call.   CM contacted for voucher.

## 2018-04-30 NOTE — Unmapped (Signed)
[facility]   Emergency Department Provider Note    ED Clinical Impression     Final diagnoses:   Upper respiratory tract infection, unspecified type (Primary)     Impression, Clinical Decision Making    Time seen: Apr 29, 2018 11:41 PM    BP 128/87  - Pulse 98  - Temp 36.7 ??C (98.1 ??F) (Oral)  - Resp 18  - SpO2 98%     Impression/Decision Making: 27 year old male with a history of VATER, history of chronic pulmonary infections, bronchiectasis, who presents for evaluation of increasing cough congestion.  No fevers or chills, no nausea vomiting or diarrhea.  - Patient is alert, oriented, nontoxic-appearing.  Vital signs significant for tachycardia initially that improved with a liter of IV fluids.  Patient with bilateral breath sounds with appears to be transmitted upper airway noise from copious secretions.  Patient is not tachypneic, no respiratory distress.  - We will plan for chest x-ray, basic labs, IV fluids, breathing treatment and reassess.     Plan Discussed with my attending, Dr. Norlene Campbell, who is agreeable.     Progress Notes:   - The patient's laboratory work-up is reassuring, chest x-ray clear.  Patient remains well-appearing in no respiratory distress.  We will plan for discharge with prescription for doxycycline for community acquired infectious coverage.  Patient was given return precautions and instructed to follow-up with pulmonology.  He states understanding and agreement with this plan.  ____________________________________________    I have reviewed the triage vital signs and the nursing notes.    Portions of this note have been dictated using Animal nutritionist. Errors have been sought but all may not have been found or corrected.      History     Chief Complaint  Congestion    HPI   Cameron Baker is a 27 y.o. male history of VATER, history of chronic pulmonary infections, bronchiectasis, who presents for evaluation of increasing cough congestion.  No fevers or chills, no nausea vomiting or diarrhea.  Patient is followed by pulmonology here at [facility].  He also states that he has been taking some prednisone that he had at home.  Denies chest pain, no abdominal pain.    Past Medical History:   Diagnosis Date   ??? ADHD    ??? Asthma    ??? Bipolar 1 disorder (CMS-HCC)    ??? Chronic tracheobronchitis (CMS-HCC)    ??? Constipation    ??? De Quervain's tenosynovitis    ??? Dysphagia    ??? Esophageal dysmotility    ??? Esophageal stricture    ??? GERD (gastroesophageal reflux disease)    ??? hypothyroid    ??? Seizures (CMS-HCC)    ??? Tracheobronchomalacia    ??? VATER syndrome    ??? Ventilator dependence (CMS-HCC)        Patient Active Problem List   Diagnosis   ??? Asthma   ??? Attention deficit disorder   ??? Slow transit constipation   ??? Dysphagia   ??? Esophageal reflux   ??? Hypothyroidism   ??? Class 1 obesity in adult   ??? Seizures (CMS-HCC)   ??? Gastrostomy status (CMS-HCC)   ??? Stricture and stenosis of esophagus   ??? Post traumatic stress disorder (PTSD)   ??? Mood disorder (CMS-HCC)   ??? Personality disorder (CMS-HCC)   ??? Chronic tracheobronchitis (CMS-HCC)   ??? Congenital tracheomalacia   ??? Tracheostomy dependence (CMS-HCC)   ??? VATER syndrome   ??? Muscle spasms of both lower extremities   ???  Pain and swelling of right wrist   ??? Hypogammaglobulinemia (CMS-HCC)   ??? Coronavirus infection   ??? Triangular fibrocartilage complex injury, left, subsequent encounter   ??? Enthesopathy of right hip region   ??? Dyspnea       Past Surgical History:   Procedure Laterality Date   ??? ANAL DILATION  01/21/2006   ??? ESOPHAGOGASTRODUODENOSCOPY      multiple   ??? HIP SURGERY Right 01/19/2017    shaved   ??? LATERAL RECTUS RECESSION  11/15/2003   ??? NISSEN FUNDOPLICATION  2001    x 2 redos   ??? PORTACATH PLACEMENT  01/21/2006    taken out same year   ??? PR ESOPHAGEAL MOTILITY STUDY, MANOMETRY N/A 04/23/2018    Procedure: ESOPHAGEAL MOTILITY STUDY W/INT & REP;  Surgeon: Nurse-Based Giproc;  [facility]: [facility];  Service: Gastroenterology   ??? PR UP GI ENDOSCOPY,BALL DIL,30MM N/A 02/22/2018    Procedure: UGI ENDO; W/BALLOON DILAT ESOPHAGUS (<30MM DIAM);  Surgeon: Zetta Bills, MD;  Location: [facility] [place];  Service: Gastroenterology   ??? REMOVAL VENOUS ACCESS PORT [facility] HISTORICAL RESULT)  11/03/2006   ??? STRABISMUS SURGERY Right    ??? TRACHEOSTOMY  1994   ??? WISDOM TOOTH EXTRACTION  04/2012   ??? WRIST SURGERY      3 times.        No current facility-administered medications for this encounter.     Current Outpatient Medications:   ???  acetaminophen (TYLENOL) 160 mg/5 mL (5 mL) suspension, 15.6 mL (500 mg total) by G-tube route every four (4) hours as needed for pain., Disp: 1 Bottle, Rfl: 0  ???  albuterol 2.5 mg /3 mL (0.083 %) nebulizer solution, Inhale 6 mL by nebulization 4 (four) times a day as needed for wheezing., Disp: , Rfl: 0  ???  albuterol HFA 90 mcg/actuation inhaler, Inhale 2 puffs every six (6) hours as needed for wheezing., Disp: 1 Inhaler, Rfl: 11  ???  arformoterol (BROVANA), Inhale 2 mL (15 mcg total) by nebulization Two (2) times a day., Disp: 120 mL, Rfl: 11  ???  azithromycin (ZITHROMAX) 500 MG tablet, 1 tablet (500 mg total) by G-tube route 3 (three) times a week., Disp: 12 tablet, Rfl: 11  ???  budesonide (PULMICORT) 0.5 mg/2 mL nebulizer solution, Inhale 4 mL (1 mg total) by nebulization Two (2) times a day., Disp: 240 mL, Rfl: 11  ???  budesonide-formoterol (SYMBICORT) 160-4.5 mcg/actuation inhaler, Inhale 2 puffs Two (2) times a day., Disp: 1 Inhaler, Rfl: 11  ???  cetirizine (ZYRTEC) 10 MG tablet, 1 tablet (10 mg total) by G-tube route every evening., Disp: , Rfl: 0  ???  cholecalciferol, vitamin D3, (VITAMIN D3) 2,000 unit cap, 3 capsules (6,000 Units total) by G-tube route daily., Disp: , Rfl: 0  ???  citalopram (CELEXA) 40 MG tablet, 1 tablet (40 mg total) by G-tube route nightly., Disp: 30 tablet, Rfl: 2  ???  clindamycin (CLINDAGEL) 1 % gel, Apply topically Two (2) times a day., Disp: 60 g, Rfl: 6  ???  codeine-guaifenesin (GUAIFENESIN AC) 10-100 mg/5 mL liquid, Take 5 mL by mouth Three (3) times a day as needed for cough. for up to 5 days, Disp: 118 mL, Rfl: 0  ???  diclofenac sodium (VOLTAREN) 1 % gel, Apply 2 g topically Four (4) times a day., Disp: 100 g, Rfl: 6  ???  dicyclomine (BENTYL) 10 mg capsule, 10 mg by G-tube route 4 (four) times a day as needed. ,  Disp: , Rfl:   ???  doxycycline (VIBRAMYCIN) 100 MG capsule, Take 1 capsule (100 mg total) by mouth Two (2) times a day. for 10 days, Disp: 20 capsule, Rfl: 0  ???  fluticasone (FLONASE) 50 mcg/actuation nasal spray, 2 sprays by Each Nare route daily. , Disp: , Rfl:   ???  gabapentin (NEURONTIN) 300 MG capsule, 300 mg by G-tube route Three (3) times a day., Disp: , Rfl:   ???  ibuprofen (ADVIL,MOTRIN) 600 MG tablet, 1 tablet (600 mg total) by G-tube route every six (6) hours as needed for pain., Disp: 30 tablet, Rfl: 1  ???  ipratropium/albuterol sulfate (DUONEB INHL), Inhale 1 vial two (2) times a day as needed., Disp: , Rfl:   ???  levothyroxine (SYNTHROID, LEVOTHROID) 75 MCG tablet, 1 tablet (75 mcg total) by G-tube route daily., Disp: 90 tablet, Rfl: 3  ???  linaclotide (LINZESS) 290 mcg capsule, 1 capsule (290 mcg total) by G-tube route daily., Disp: 30 capsule, Rfl: 0  ???  LORazepam (ATIVAN) 0.5 MG tablet, Take 1 tablet (0.5mg ) twice daily and 1 tablet (0.5mg ) daily as needed for anxiety via G-tube, Disp: 70 tablet, Rfl: 2  ???  meloxicam (MOBIC) 7.5 MG tablet, Take 1 tablet (7.5 mg total) by mouth daily. for 15 days, Disp: 15 tablet, Rfl: 0  ???  minocycline (MINOCIN,DYNACIN) 100 MG capsule, 1 capsule (100 mg total) by G-tube route nightly., Disp: 90 capsule, Rfl: 3  ???  mirtazapine (REMERON) 15 MG tablet, 1 tablet (15 mg total) by G-tube route nightly., Disp: 30 tablet, Rfl: 2  ???  montelukast (SINGULAIR) 10 mg tablet, Take 1 tablet (10 mg total) by mouth nightly., Disp: 30 tablet, Rfl: 11  ???  omeprazole (PRILOSEC) 20 MG capsule, 20 mg Two (2) times a day (30 minutes before a meal). Via G-tube, Disp: , Rfl: ???  ondansetron (ZOFRAN) 4 MG tablet, 1 tablet (4 mg total) by G-tube route every eight (8) hours as needed for nausea., Disp: 20 tablet, Rfl: 0  ???  oxyCODONE (ROXICODONE) 5 MG immediate release tablet, 5 mg by G-tube route every four (4) hours as needed for pain. , Disp: , Rfl:   ???  PARI LC D NEBULIZER Misc, Provide 1 device  for use with inhaled medication., Disp: 2 each, Rfl: 11  ???  predniSONE (DELTASONE) 10 MG tablet, Take 40mg  (4 tabs) daily x5 day, then 30mg  (3 tabs) daily x5 day, then 20mg  (2 tabs) daily x 5 day, then 10mg  (1 tab) daily x5 day, then off, Disp: 50 tablet, Rfl: 0  ???  promethazine (PHENERGAN) 6.25 mg/5 mL syrup, Take 5 mL (6.25 mg total) by mouth 4 (four) times a day as needed for nausea. for up to 7 days, Disp: 120 mL, Rfl: 0  ???  sodium chloride 7% 7 % Nebu, Inhale 4 mL by nebulization 4 (four) times a day. (Patient taking differently: Inhale 4 mL by nebulization 2 (two) times a day. ), Disp: 480 mL, Rfl: 0  ???  tiZANidine (ZANAFLEX) 4 MG tablet, 1 tablet (4 mg total) by G-tube route Three (3) times a day as needed. (Patient taking differently: 4 mg by G-tube route two (2) times a day as needed. ), Disp: 30 tablet, Rfl: 0  ???  tobramycin, PF, (TOBI) 300 mg/5 mL nebulizer solution, Inhale 5 mL (300 mg total) by nebulization every twelve (12) hours. Every other month, Disp: 280 mL, Rfl: 5  ???  triamcinolone (KENALOG) 0.1 % cream, Apply topically Two (2)  times a day., Disp: 15 g, Rfl: 11  ???  valproate (DEPAKENE) 250 mg/5 mL syrup, TAKE BY G-TUBE EVERY MORNING AND EVERY EVENING, Disp: 1850 mL, Rfl: 0    Allergies  Xopenex [levalbuterol hcl]; Allopurinol analogues; and Versed [midazolam]    Family History   Adopted: Yes       Social History  Social History     Tobacco Use   ??? Smoking status: Never Smoker   ??? Smokeless tobacco: Never Used   Substance Use Topics   ??? Alcohol use: Yes     Alcohol/week: 1.2 oz     Types: 2 Cans of beer per week     Comment: on weekends sometimes   ??? Drug use: No Review of Systems  Constitutional: Negative for fever/chills  Eyes: Negative for blurry vision.   ENT: + cough, congestion.  Cardiovascular: Negative for chest pain  Respiratory: Negative for shortness of breath. Negative cough.   Gastrointestinal: Negative for abdominal pain. Negative for N/V/D.   Genitourinary: Negative for dysuria  Musculoskeletal: negative for joint pain  Skin: Negative for rash.   Neurological: Negative for headaches, focal weakness or numbness.    Physical Exam     VITAL SIGNS:    BP 128/87  - Pulse 98  - Temp 36.7 ??C (98.1 ??F) (Oral)  - Resp 18  - SpO2 98%     Constitutional: Alert and oriented. No acute distress.  Eyes: No scleral icterus.   ENT       Head: Normocephalic and atraumatic.       Nose: No congestion. No epistaxis.        Mouth/Throat: Mucous membranes are moist.       Neck: supple, full ROM. Trach in place.   Cardiovascular: tachycardia, regular rhythm. Normal S1/S2. No murmurs, rubs, or gallops. Normal and symmetric distal pulses are present in all extremities.  Respiratory: Normal respiratory effort. Bilateral rhonchi but good air movement overall.   Gastrointestinal: Soft and nontender, nondistended, no rebound.   Musculoskeletal: normal ROM, no edema, no deformities  Neurologic: Normal speech and language. CN II-XII intact bilaterally. No focal deficits.   Skin: Skin is warm, dry and intact. No rash noted.    Radiology     XR Chest 2 views   Preliminary Result   --No acute airspace disease.               Duane Lope, MD  Resident  04/30/18 [phone]

## 2018-05-03 ENCOUNTER — Ambulatory Visit
Admit: 2018-05-03 | Discharge: 2018-05-12 | Disposition: A | Discharge status: Home Health | Payer: MEDICARE | Admitting: Internal Medicine

## 2018-05-03 DIAGNOSIS — R0602 Shortness of breath: Principal | ICD-10-CM

## 2018-05-03 LAB — CBC W/ AUTO DIFF
BASOPHILS ABSOLUTE COUNT: 0.1 10*9/L (ref 0.0–0.1)
BASOPHILS ABSOLUTE COUNT: 0 10*9/L (ref 0.0–0.1)
BASOPHILS RELATIVE PERCENT: 0.5 %
BASOPHILS RELATIVE PERCENT: 0.5 %
EOSINOPHILS ABSOLUTE COUNT: 0.1 10*9/L (ref 0.0–0.4)
EOSINOPHILS ABSOLUTE COUNT: 0.1 10*9/L (ref 0.0–0.4)
EOSINOPHILS RELATIVE PERCENT: 1 %
EOSINOPHILS RELATIVE PERCENT: 1.1 %
HEMATOCRIT: 41.9 % (ref 41.0–53.0)
HEMATOCRIT: 40.5 % — ABNORMAL LOW (ref 41.0–53.0)
HEMOGLOBIN: 14.4 g/dL (ref 13.5–17.5)
HEMOGLOBIN: 13.4 g/dL — ABNORMAL LOW (ref 13.5–17.5)
LARGE UNSTAINED CELLS: 2 % (ref 0–4)
LARGE UNSTAINED CELLS: 2 % (ref 0–4)
LYMPHOCYTES ABSOLUTE COUNT: 2.7 10*9/L (ref 1.5–5.0)
LYMPHOCYTES ABSOLUTE COUNT: 2.8 10*9/L (ref 1.5–5.0)
LYMPHOCYTES RELATIVE PERCENT: 23.2 %
LYMPHOCYTES RELATIVE PERCENT: 30.9 %
MEAN CORPUSCULAR HEMOGLOBIN CONC: 34.2 g/dL (ref 31.0–37.0)
MEAN CORPUSCULAR HEMOGLOBIN CONC: 33 g/dL (ref 31.0–37.0)
MEAN CORPUSCULAR HEMOGLOBIN: 25.6 pg — ABNORMAL LOW (ref 26.0–34.0)
MEAN CORPUSCULAR HEMOGLOBIN: 24.9 pg — ABNORMAL LOW (ref 26.0–34.0)
MEAN CORPUSCULAR VOLUME: 74.7 fL — ABNORMAL LOW (ref 80.0–100.0)
MEAN CORPUSCULAR VOLUME: 75.5 fL — ABNORMAL LOW (ref 80.0–100.0)
MEAN PLATELET VOLUME: 7.1 fL (ref 7.0–10.0)
MONOCYTES ABSOLUTE COUNT: 0.6 10*9/L (ref 0.2–0.8)
MONOCYTES ABSOLUTE COUNT: 0.7 10*9/L (ref 0.2–0.8)
MONOCYTES RELATIVE PERCENT: 7.5 %
NEUTROPHILS ABSOLUTE COUNT: 7.9 10*9/L — ABNORMAL HIGH (ref 2.0–7.5)
NEUTROPHILS ABSOLUTE COUNT: 5.3 10*9/L (ref 2.0–7.5)
NEUTROPHILS RELATIVE PERCENT: 58.3 %
PLATELET COUNT: 644 10*9/L — ABNORMAL HIGH (ref 150–440)
PLATELET COUNT: 567 10*9/L — ABNORMAL HIGH (ref 150–440)
RED BLOOD CELL COUNT: 5.62 10*12/L (ref 4.50–5.90)
RED BLOOD CELL COUNT: 5.36 10*12/L (ref 4.50–5.90)
RED CELL DISTRIBUTION WIDTH: 15.2 % — ABNORMAL HIGH (ref 12.0–15.0)
RED CELL DISTRIBUTION WIDTH: 15.3 % — ABNORMAL HIGH (ref 12.0–15.0)
WBC ADJUSTED: 11.5 10*9/L — ABNORMAL HIGH (ref 4.5–11.0)

## 2018-05-03 LAB — COMPREHENSIVE METABOLIC PANEL
ALKALINE PHOSPHATASE: 54 U/L (ref 38–126)
ALT (SGPT): 48 U/L (ref 19–72)
ANION GAP: 13 mmol/L (ref 9–15)
AST (SGOT): 46 U/L (ref 19–55)
BILIRUBIN TOTAL: 0.7 mg/dL (ref 0.0–1.2)
BLOOD UREA NITROGEN: 9 mg/dL (ref 7–21)
CALCIUM: 10.3 mg/dL — ABNORMAL HIGH (ref 8.5–10.2)
CHLORIDE: 104 mmol/L (ref 98–107)
CO2: 22 mmol/L (ref 22.0–30.0)
CREATININE: 0.9 mg/dL (ref 0.70–1.30)
EGFR MDRD AF AMER: >=60 mL/min/{1.73_m2} (ref >=60)
EGFR MDRD NON AF AMER: >=60 mL/min/{1.73_m2} (ref >=60)
GLUCOSE RANDOM: 136 mg/dL (ref 65–179)
POTASSIUM: 4.5 mmol/L (ref 3.5–5.0)
PROTEIN TOTAL: 8.1 g/dL (ref 6.5–8.3)
SODIUM: 139 mmol/L (ref 135–145)

## 2018-05-03 LAB — BLOOD GAS, VENOUS
BASE EXCESS VENOUS: 1 (ref -2.0–2.0)
PCO2 VENOUS: 48 mmHg (ref 40–60)
PO2 VENOUS: 70 mmHg — ABNORMAL HIGH (ref 30–55)

## 2018-05-03 LAB — PROTEIN TOTAL: Protein:MCnc:Pt:Ser/Plas:Qn:: 8.1

## 2018-05-03 LAB — SPECIMEN SOURCE

## 2018-05-03 LAB — LIPASE: Triacylglycerol lipase:CCnc:Pt:Ser/Plas:Qn:: 98

## 2018-05-03 LAB — PROTIME: Lab: 11.1

## 2018-05-03 LAB — NEUTROPHILS RELATIVE PERCENT: Lab: 58.3

## 2018-05-03 LAB — BASOPHILS RELATIVE PERCENT: Lab: 0.5

## 2018-05-03 LAB — LACTATE BLOOD VENOUS: Lactate:SCnc:Pt:BldV:Qn:: 1.8

## 2018-05-03 NOTE — Unmapped (Signed)
[facility]   Emergency Department Provider Note    ED Final Clinical Impression     Final diagnoses:   Acute tracheobronchitis (Primary)   SOB (shortness of breath)         ED Course, Assessment and Plan     Initial Clinical Impression:    Cameron Baker is a 27 y.o. male with past medical history as below who presents to the emergency department for progressively worsening shortness of breath and productive cough over the past several days.  Patient has been in contact with his outpatient pulmonologist who recommended the patient come to the hospital for further evaluation and management.  He is overall nontoxic-appearing, is mildly tachycardic but is oxygenating well on room air through his trach, has a stable blood pressure, is afebrile.  Suspect recurrent bout of tracheobronchitis, will obtain blood work, respiratory viral panel, lower respiratory culture, chest x-ray, EKG, and discuss with the pulmonology team regarding antibiotics.      ED Course:    1:47 AM: WBC count 11.5, electrolytes normal, chest x-ray has been completed, we are pending radiology interpretation.  I spoke with the evaluate the patient for admission.    I have discussed the case with the Attending Physician, Dr. Lum Babe who was in agreement with the plan as described above.  ____________________________________________    Time seen: May 03, 2018 1:03 AM     History     CHIEF COMPLAINT:   Chief Complaint   Patient presents with   ??? Shortness of Breath       HPI:   Cameron Baker is a 27 y.o. male with PMH of chronic tracheobronchitis with tracheostomy in place, VATER syndrome, seizure disorder who presents the emergency department with persistent shortness of breath, productive cough for the past several days.  Patient has been evaluated in the [facility] emergency department twice in the past week, was thought to have upper respiratory infectious process and started on doxycycline.  He is now been taking this for 3 days but symptoms have not improved.  He endorses shortness of breath, that he feels like his airways are tight, he is having a productive cough with yellow sputum that is new.  He also endorses chest pain that he attributes to persistent coughing over the past several days.  He has been using all of his home medications including his tobramycin nebulizers and prophylactic azithromycin as prescribed without improvement in symptoms.  He discussed with the on-call pulmonology fellow who recommended the patient present to the hospital for continued evaluation and likely admission.  He denies any fevers at home, but does endorse chills.  He denies headaches, vomiting.    ____________________________________________    PAST MEDICAL HISTORY/PAST SURGICAL HISTORY:   Past Medical History:   Diagnosis Date   ??? ADHD    ??? Asthma    ??? Bipolar 1 disorder (CMS-HCC)    ??? Chronic tracheobronchitis (CMS-HCC)    ??? Constipation    ??? De Quervain's tenosynovitis    ??? Dysphagia    ??? Esophageal dysmotility    ??? Esophageal stricture    ??? GERD (gastroesophageal reflux disease)    ??? hypothyroid    ??? Seizures (CMS-HCC)    ??? Tracheobronchomalacia    ??? VATER syndrome    ??? Ventilator dependence (CMS-HCC)        MEDICATIONS:     Current Facility-Administered Medications:   ???  oxyCODONE (ROXICODONE) 5 mg/5 mL solution 5 mg, 5 mg, G-tube, Once, [employer]  Tressie Stalker, MD  ???  promethazine (PHENERGAN) injection 12.5 mg, 12.5 mg, Intravenous, Once, Karel Jarvis, MD    Current Outpatient Medications:   ???  acetaminophen (TYLENOL) 160 mg/5 mL (5 mL) suspension, 15.6 mL (500 mg total) by G-tube route every four (4) hours as needed for pain., Disp: 1 Bottle, Rfl: 0  ???  albuterol 2.5 mg /3 mL (0.083 %) nebulizer solution, Inhale 6 mL by nebulization 4 (four) times a day as needed for wheezing., Disp: , Rfl: 0  ???  albuterol HFA 90 mcg/actuation inhaler, Inhale 2 puffs every six (6) hours as needed for wheezing., Disp: 1 Inhaler, Rfl: 11  ???  arformoterol (BROVANA), Inhale 2 mL (15 mcg total) by nebulization Two (2) times a day., Disp: 120 mL, Rfl: 11  ???  azithromycin (ZITHROMAX) 500 MG tablet, 1 tablet (500 mg total) by G-tube route 3 (three) times a week., Disp: 12 tablet, Rfl: 11  ???  budesonide (PULMICORT) 0.5 mg/2 mL nebulizer solution, Inhale 4 mL (1 mg total) by nebulization Two (2) times a day., Disp: 240 mL, Rfl: 11  ???  budesonide-formoterol (SYMBICORT) 160-4.5 mcg/actuation inhaler, Inhale 2 puffs Two (2) times a day., Disp: 1 Inhaler, Rfl: 11  ???  cetirizine (ZYRTEC) 10 MG tablet, 1 tablet (10 mg total) by G-tube route every evening., Disp: , Rfl: 0  ???  cholecalciferol, vitamin D3, (VITAMIN D3) 2,000 unit cap, 3 capsules (6,000 Units total) by G-tube route daily., Disp: , Rfl: 0  ???  citalopram (CELEXA) 40 MG tablet, 1 tablet (40 mg total) by G-tube route nightly., Disp: 30 tablet, Rfl: 2  ???  clindamycin (CLINDAGEL) 1 % gel, Apply topically Two (2) times a day., Disp: 60 g, Rfl: 6  ???  diclofenac sodium (VOLTAREN) 1 % gel, Apply 2 g topically Four (4) times a day., Disp: 100 g, Rfl: 6  ???  dicyclomine (BENTYL) 10 mg capsule, 10 mg by G-tube route 4 (four) times a day as needed. , Disp: , Rfl:   ???  doxycycline (VIBRAMYCIN) 100 MG capsule, Take 1 capsule (100 mg total) by mouth Two (2) times a day. for 10 days, Disp: 20 capsule, Rfl: 0  ???  fluticasone (FLONASE) 50 mcg/actuation nasal spray, 2 sprays by Each Nare route daily. , Disp: , Rfl:   ???  gabapentin (NEURONTIN) 300 MG capsule, 300 mg by G-tube route Three (3) times a day., Disp: , Rfl:   ???  ibuprofen (ADVIL,MOTRIN) 600 MG tablet, 1 tablet (600 mg total) by G-tube route every six (6) hours as needed for pain., Disp: 30 tablet, Rfl: 1  ???  ipratropium/albuterol sulfate (DUONEB INHL), Inhale 1 vial two (2) times a day as needed., Disp: , Rfl:   ???  levothyroxine (SYNTHROID, LEVOTHROID) 75 MCG tablet, 1 tablet (75 mcg total) by G-tube route daily., Disp: 90 tablet, Rfl: 3  ???  linaclotide (LINZESS) 290 mcg capsule, 1 capsule (290 mcg total) by G-tube route daily., Disp: 30 capsule, Rfl: 0  ???  LORazepam (ATIVAN) 0.5 MG tablet, Take 1 tablet (0.5mg ) twice daily and 1 tablet (0.5mg ) daily as needed for anxiety via G-tube, Disp: 70 tablet, Rfl: 2  ???  meloxicam (MOBIC) 7.5 MG tablet, Take 1 tablet (7.5 mg total) by mouth daily. for 15 days, Disp: 15 tablet, Rfl: 0  ???  minocycline (MINOCIN,DYNACIN) 100 MG capsule, 1 capsule (100 mg total) by G-tube route nightly., Disp: 90 capsule, Rfl: 3  ???  mirtazapine (REMERON) 15 MG tablet, 1 tablet (  15 mg total) by G-tube route nightly., Disp: 30 tablet, Rfl: 2  ???  montelukast (SINGULAIR) 10 mg tablet, Take 1 tablet (10 mg total) by mouth nightly., Disp: 30 tablet, Rfl: 11  ???  omeprazole (PRILOSEC) 20 MG capsule, 20 mg Two (2) times a day (30 minutes before a meal). Via G-tube, Disp: , Rfl:   ???  oxyCODONE (ROXICODONE) 5 MG immediate release tablet, 5 mg by G-tube route every four (4) hours as needed for pain. , Disp: , Rfl:   ???  PARI LC D NEBULIZER Misc, Provide 1 device  for use with inhaled medication., Disp: 2 each, Rfl: 11  ???  predniSONE (DELTASONE) 10 MG tablet, Take 40mg  (4 tabs) daily x5 day, then 30mg  (3 tabs) daily x5 day, then 20mg  (2 tabs) daily x 5 day, then 10mg  (1 tab) daily x5 day, then off, Disp: 50 tablet, Rfl: 0  ???  sodium chloride 7% 7 % Nebu, Inhale 4 mL by nebulization 4 (four) times a day. (Patient taking differently: Inhale 4 mL by nebulization 2 (two) times a day. ), Disp: 480 mL, Rfl: 0  ???  tiZANidine (ZANAFLEX) 4 MG tablet, 1 tablet (4 mg total) by G-tube route Three (3) times a day as needed. (Patient taking differently: 4 mg by G-tube route two (2) times a day as needed. ), Disp: 30 tablet, Rfl: 0  ???  tobramycin, PF, (TOBI) 300 mg/5 mL nebulizer solution, Inhale 5 mL (300 mg total) by nebulization every twelve (12) hours. Every other month, Disp: 280 mL, Rfl: 5  ???  triamcinolone (KENALOG) 0.1 % cream, Apply topically Two (2) times a day., Disp: 15 g, Rfl: 11  ???  valproate (DEPAKENE) 250 mg/5 mL syrup, TAKE BY G-TUBE EVERY MORNING AND EVERY EVENING, Disp: 1850 mL, Rfl: 0    ALLERGIES:   Xopenex [levalbuterol hcl]; Allopurinol analogues; and Versed [midazolam]    SOCIAL HISTORY:   Social History     Tobacco Use   ??? Smoking status: Never Smoker   ??? Smokeless tobacco: Never Used   Substance Use Topics   ??? Alcohol use: Yes     Alcohol/week: 1.2 oz     Types: 2 Cans of beer per week     Comment: on weekends sometimes       FAMILY HISTORY:  Family History   Adopted: Yes        ____________________________________________    Review of Systems  Constitutional: Negative for fever.  Cardiovascular: Positive for chest pain.  Respiratory: Positive for shortness of breath.  A 10 point review of systems was performed and is negative other than positive elements noted in HPI     Physical Exam     VITAL SIGNS:    BP 119/89  - Pulse 103  - Temp 36.8 ??C (98.2 ??F) (Oral)  - Resp 18  - SpO2 99%     Constitutional: Alert and oriented. Well appearing and in no distress.  Head: Normocephalic and atraumatic  Eyes: EOMI, PERRL, conjunctiva normal  ENT: External ears normal, tracheostomy in place, mucous membranes moist  Cardiovascular: Mildly tachycardic, regular rhythm. Normal and symmetric distal pulses are present in all extremities.  Respiratory: Normal respiratory effort.  Coarse breath sounds bilaterally but no wheezes appreciated  Gastrointestinal: Soft, non-distended and nontender.  G-tube in place with small amount of surrounding erythema that patient reports is chronic for him.  There is no CVA tenderness.  Musculoskeletal: Nontender with normal range of motion in all extremities.  Right lower leg: No tenderness or edema.       Left lower leg: No tenderness or edema.  Neurologic: Normal speech and language. No gross focal neurologic deficits are appreciated.  Skin: Skin is warm, dry and intact. No rash noted.  Psychiatric: Mood and affect are normal. Speech and behavior are normal.      Radiology XR Chest 2 views   Preliminary Result   No acute airspace disease.              Pertinent labs & imaging results that were available during my care of the patient were reviewed by me and considered in my medical decision making. Labs and radiology studies included in my note may not constitute all ordered/reviewied labs during this encounter (see chart for details).    Portions of this record have been created using Scientist, clinical (histocompatibility and immunogenetics). Dictation errors have been sought, but may not have been identified and corrected.     Karel Jarvis, MD  Resident  05/03/18 [phone]

## 2018-05-03 NOTE — Unmapped (Signed)
Pt here with SOB  Pt with trach

## 2018-05-03 NOTE — Unmapped (Signed)
Pt bed low and locked, side rails up x2, pt belongings and call bell within reach. Pt resp even and unlabored. No acute signs of distress or discomfort noted at this time. Will continue to assess.

## 2018-05-03 NOTE — Unmapped (Addendum)
[ ]   f/u Pulmonology clinic appt  [ ]  f/u GI clinic appt (for further mgmt of type 2 achalasia)   [ ]  talk to??Jodie (clinic RT)??regarding exchange of Trilogy??vent??and whether DME company??delivered??cough assist (w/ oscillatory function)    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   ??  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 He experiences repeated episodes of tracheobronchitis as a result. Over the past 12 months,??he??has been seen in the??ED >10 times, mostly for exacerbations.??Presented on 05/03/18 with symptoms c/w another exacerbation. RVP negative. CXR unremarkable. RVP was negative. LRCx have shown OPF and BCx without growth. Steroids??were initially started on admission, but discontinued soon after since little-to-improvement on steroid course he took week prior to admission. He continued on his home Azithro??MWF.   AC: Metanebs, Budesonide, Brovana, Duonebs, 3%HTS  ABX: PO Doxy (5/27-6/5), IV Ceftaz (5/27-6/5)    ??  VATER Syndrome.??History of tracheobronchomalacia??s/p tracheosotomy??w/ chronic nocturnal ventilation??(Trilogy vent).??GI-related manifestations of the syndrome include GERD, esophageal dysmotility, and recurrent esophageal strictures with recent finding of type II achalasia. Follows with Dr. Wilford [facility]). He continued nightly Trilogy vent as well as home meds for tracheobronchomalacia (Symbicort, Duonebs, Brovana). Gabapentin, Protonix 20mg  QD, and Zanaflex PRN  SMOG enema QD PRN for constipation  and Zofran & Phenergan alternated PRN for N/V.  ??  Bipolar 1??disorder, PTSD:??Has followed [facility] and undergone psychotherapy previously.??Continue Celexa, Valproate, and Mirtazapine.  ??  Hypothyroidism: Synthroid.  ??  Acne: home Minocycline 100mg  QHS held while on Doxycycline

## 2018-05-03 NOTE — Unmapped (Signed)
Admit team at bedside.

## 2018-05-03 NOTE — Unmapped (Signed)
General Medicine History and Physical    Assessment/Plan:    Principal Problem:    Tracheobronchitis  Active Problems:    Asthma    Slow transit constipation    Dysphagia    Esophageal reflux    Hypothyroidism    Seizures (CMS-HCC)    Mood disorder (CMS-HCC)    Congenital tracheomalacia    Tracheostomy dependence (CMS-HCC)    VATER syndrome    Muscle spasms of both lower extremities    Dyspnea      Cameron Baker is a 27 y.o. male with PMHx of hypothyroidism, asthma, mood d/o and VATER syndrome s/p trach/peg w/ multiple recent admissions for tracheobronchitis that presented to [facility] with symptoms c/w Tracheobronchitis.  ??  Cough and Sputum Production c/f tracheobronchitis:   Patient reports that symptoms are similar to his previous episodes of tracheobronchitis.  Sputum cultures have previously grown Serratia marcescens (2013), E. coli resistant to ampicillin, susceptible to amp/sulbactam (October 2018), Haemophilus influenza (November 2018) with more recent lower respiratory cultures negative including that collected from his recent emergency department visit on 04/30/2018.  Chest x-ray without acute airspace disease on day of admission.  Patient recently prescribed doxycycline without improvement of symptoms.  During his last hospitalization in April, patient was treated with vancomycin and cefepime for concern for hospital-acquired pneumonia (given a prior course of oral Augmentin on which he did not improve) and completed ceftaz and doxy x7d w/ transition to linezolid on d/c.   -- start Ceftazidime 1g q8, doxy 100mg  q12  -- f/u RVP  -- f/u blood cx  -- f/u Lower respiratory culture  -- Prednisone 40 mg daily  -- Singulair 10 mg nightly  -- 4x daily Duonebs  -- hold home azithro ppx (500mg  MWF)  -- lidocaine patch prn for pleuritic chest pain a/w cough  ??  VATER Syndrome  -- Continue home linaclotide 290 mg daily  -- Prn tizanidine  -- Continue home gabapentin 300 mg TID  -- meloxicam 7.5mg  qd  -- Continue home valproate 750 mg qAM and 250 mg qPM  ??  Hypothyroidism  -- continue home levothyroxine 75 mcg daily  ??  Asthma  -- Continue home budesonide nebulizer BID  ??  Mood Disorder  -- Continue home citalopram 40 mg nightly  ??  PPx: lovenox    Dispo: stepdown status (nighttime trilogy vent)    Code Status:  Full, mother named as HCPOA  ___________________________________________________________________    Chief Complaint  Chief Complaint   Patient presents with   ??? Shortness of Breath       HPI:  Cameron Baker is a 27 y.o. man with a history of VATER Syndrome now g-tube and vent dependent, hyperthyroidism, asthma, and GERD who presents with cough and sputum production.     Patient was discharged from inpatient pulmonology service on 03/29/2018 after being admitted on 03/23/2018 with tracheobronchitis.  He was discharged on a 7-day course of linezolid and apparently improved.  However patient notes that he has had increasing congestion and cough for the last 7 days.  He was given a 5-day steroid burst, 5/19 through 5/24 without improvement.  He reported the emergency department on 5/23 with increased wheezing, shortness of breath, sputum production.  He was says that his symptoms have not improved and that his shortness of breath has gotten worse.  Also endorsing increased cough productive of yellow sputum.  Endorsing decreased appetite.  Denies hemoptysis.  Sent home with doxycycline.  He he says that this feels  similar to his past episodes of tracheobronchitis.  Reports adherence to his home pulmonary hygiene with his vest, hypertonic saline, budesonide twice daily, duo nebs, and Brovana nebulizer twice daily.  He does endorse sick contacts with the friend with him he has been staying having a respiratory illness recently.  Patient is on a half month for his inhaled tobramycin, due to restart mid June. This is his 8th hospitalization this year, most of which have been for tracheobronchitis.     Denies fevers, chills.  Endorsing some nausea without emesis.  No diarrhea.  Does endorse some chest pain around his ribs from coughing.     His past medical history is complex due to his history of Vader syndrome with primary manifestations of tracheomalacia and recurrent esophageal strictures causing chronic dysphasia and requires all medications to be delivered via G-tube.  He has had multiple esophageal dilations most recently 02/22/2018 [place]. He has not been to see his pulmonologist is over a year and does not have PFTs in the system. However, he is trach dependent and uses a trilogy vent at night. Lower respiratory cultures have grown E coli and H flu in 2018, mould in 2014, ans serratia in 2013.     Upon arrival, pt HDS and afebrile. Labs at baseline. Non-toxic appearing.     Allergies:  Xopenex [levalbuterol hcl]; Allopurinol analogues; and Versed [midazolam]    Medications:   Prior to Admission medications    Medication Dose, Route, Frequency   acetaminophen (TYLENOL) 160 mg/5 mL (5 mL) suspension 500 mg, G-tube, Every 4 hours PRN   albuterol 2.5 mg /3 mL (0.083 %) nebulizer solution 6 mL, Nebulization, 4 times daily PRN   albuterol HFA 90 mcg/actuation inhaler 2 puffs, Inhalation, Every 6 hours PRN   arformoterol (BROVANA) 15 mcg, Nebulization, 2 times a day (standard)   azithromycin (ZITHROMAX) 500 MG tablet 500 mg, G-tube, 3 times weekly   budesonide (PULMICORT) 0.5 mg/2 mL nebulizer solution 1 mg, Nebulization, 2 times a day (standard)   budesonide-formoterol (SYMBICORT) 160-4.5 mcg/actuation inhaler 2 puffs, Inhalation, 2 times a day (standard)   cetirizine (ZYRTEC) 10 MG tablet 10 mg, G-tube, Every evening   cholecalciferol, vitamin D3, (VITAMIN D3) 2,000 unit cap 6,000 Units, G-tube, Daily (standard)   citalopram (CELEXA) 40 MG tablet 40 mg, G-tube, Nightly   clindamycin (CLINDAGEL) 1 % gel Topical, 2 times a day (standard)   diclofenac sodium (VOLTAREN) 1 % gel 2 g, Topical, 4 times a day   dicyclomine (BENTYL) 10 mg capsule 10 mg, G-tube, 4 times daily PRN   doxycycline (VIBRAMYCIN) 100 MG capsule 100 mg, Oral, 2 times a day (standard)   fluticasone (FLONASE) 50 mcg/actuation nasal spray 2 sprays, Each Nare, Daily (standard)   gabapentin (NEURONTIN) 300 MG capsule 300 mg, G-tube, 3 times a day (standard)   ibuprofen (ADVIL,MOTRIN) 600 MG tablet 600 mg, G-tube, Every 6 hours PRN   ipratropium/albuterol sulfate (DUONEB INHL) 1 vial, Inhalation, 2 times a day PRN   levothyroxine (SYNTHROID, LEVOTHROID) 75 MCG tablet 75 mcg, G-tube, Daily (standard)   linaclotide (LINZESS) 290 mcg capsule 290 mcg, G-tube, Daily (standard)   LORazepam (ATIVAN) 0.5 MG tablet Take 1 tablet (0.5mg ) twice daily and 1 tablet (0.5mg ) daily as needed for anxiety via G-tube   meloxicam (MOBIC) 7.5 MG tablet 7.5 mg, Oral, Daily (standard)   minocycline (MINOCIN,DYNACIN) 100 MG capsule 100 mg, G-tube, Nightly   mirtazapine (REMERON) 15 MG tablet 15 mg, G-tube, Nightly   montelukast (SINGULAIR) 10 mg  tablet 10 mg, Oral, Nightly   omeprazole (PRILOSEC) 20 MG capsule 20 mg, 2 times a day (AC), Via G-tube   oxyCODONE (ROXICODONE) 5 MG immediate release tablet 5 mg, G-tube, Every 4 hours PRN   PARI LC D NEBULIZER Misc Provide 1 device  for use with inhaled medication.   predniSONE (DELTASONE) 10 MG tablet Take 40mg  (4 tabs) daily x5 day, then 30mg  (3 tabs) daily x5 day, then 20mg  (2 tabs) daily x 5 day, then 10mg  (1 tab) daily x5 day, then off   sodium chloride 7% 7 % Nebu 4 mL, Nebulization, 4 times daily (RT)  Patient taking differently: Inhale 4 mL by nebulization 2 (two) times a day.    tiZANidine (ZANAFLEX) 4 MG tablet 4 mg, G-tube, 3 times a day PRN  Patient taking differently: 4 mg by G-tube route two (2) times a day as needed.    tobramycin, PF, (TOBI) 300 mg/5 mL nebulizer solution 300 mg, Nebulization, Every 12 hours, Every other month   triamcinolone (KENALOG) 0.1 % cream Topical, 2 times a day (standard)   valproate (DEPAKENE) 250 mg/5 mL syrup TAKE BY G-TUBE EVERY MORNING AND EVERY EVENING       Medical History:  Past Medical History:   Diagnosis Date   ??? ADHD    ??? Asthma    ??? Bipolar 1 disorder (CMS-HCC)    ??? Chronic tracheobronchitis (CMS-HCC)    ??? Constipation    ??? De Quervain's tenosynovitis    ??? Dysphagia    ??? Esophageal dysmotility    ??? Esophageal stricture    ??? GERD (gastroesophageal reflux disease)    ??? hypothyroid    ??? Seizures (CMS-HCC)    ??? Tracheobronchomalacia    ??? VATER syndrome    ??? Ventilator dependence (CMS-HCC)        Surgical History:  Past Surgical History:   Procedure Laterality Date   ??? ANAL DILATION  01/21/2006   ??? ESOPHAGOGASTRODUODENOSCOPY      multiple   ??? HIP SURGERY Right 01/19/2017    shaved   ??? LATERAL RECTUS RECESSION  11/15/2003   ??? NISSEN FUNDOPLICATION  2001    x 2 redos   ??? PORTACATH PLACEMENT  01/21/2006    taken out same year   ??? PR ESOPHAGEAL MOTILITY STUDY, MANOMETRY N/A 04/23/2018    Procedure: ESOPHAGEAL MOTILITY STUDY W/INT & REP;  Surgeon: Nurse-Based Giproc;  [facility]: [facility];  Service: Gastroenterology   ??? PR UP GI ENDOSCOPY,BALL DIL,30MM N/A 02/22/2018    Procedure: UGI ENDO; W/BALLOON DILAT ESOPHAGUS (<30MM DIAM);  Surgeon: Zetta Bills, MD;  Location: [facility];  Service: Gastroenterology   ??? REMOVAL VENOUS ACCESS PORT [facility] HISTORICAL RESULT)  11/03/2006   ??? STRABISMUS SURGERY Right    ??? TRACHEOSTOMY  1994   ??? WISDOM TOOTH EXTRACTION  04/2012   ??? WRIST SURGERY      3 times.        Social History:  Tobacco use:   reports that he has never smoked. He has never used smokeless tobacco.  Alcohol use:   reports that he drinks about 1.2 oz of alcohol per week.  Drug use:  reports that he does not use drugs.  Living situation: the patient a friend.    Family History:  Family History   Adopted: Yes       Review of Systems:  10 systems reviewed and are negative unless otherwise mentioned in HPI  Physical Exam:  Temp:  [36.8 ??C] 36.8 ??C  Heart Rate:  [103] 103  Resp:  [18] 18  BP: (119)/(89) 119/89  FiO2 (%):  [35 %] 35 %  SpO2:  [99 %] 99 %  There is no height or weight on file to calculate BMI.    GEN: NAD, lying in bed, did not cough during my evaluation  EYES: injected sclera  ENT: MMM  NECK: trach site c/d/i w/o drainage  CV: RRR, no murmurs appreciated  PULM: CTA B, no wheezes, no IWOB  ABD: soft, NT/ND, +BS, erythematous/scaling patch surrounding g-tube site that pt reports is chronic  EXT: No edema  NEURO: No focal deficits  PSYCH: A+Ox3, appropriate    Test Results:  Data Review:  I have reviewed the labs and studies from the last 24 hours.    Imaging: Radiology studies were personally reviewed    EKG: pending

## 2018-05-03 NOTE — Unmapped (Signed)
Called patient back to let him know a bed would be available in the next few hours (needed to be cleaned.) He is not comfortable waiting and will call EMS vs present to the ER.     I told him I agree with the safest option of getting in which would be via EMS rather than driving himself if he is not feeling well.     He agreed with this plan.

## 2018-05-03 NOTE — Unmapped (Signed)
Pt resting in treatment room with eyes closed. Pt bed low and locked, side rails up x2, pt belongings and call bell within reach. Pt resp even and unlabored. No acute signs of distress or discomfort noted at this time. Will continue to assess.

## 2018-05-03 NOTE — Unmapped (Signed)
Plan of care reviewed with pt. Pt alert and oriented and in no acute distress. Pt VSS throughout shift. Pt has #6 Bivona and on RA+ speaking valve. Pt IV ABX continued via PIV and ABX given via Kathryne Eriksson. Pt on droplet precautions for R/O resp panel. Pt safety maintained. Will continue to monitor and reassess. Pt safety maintained.   Problem: Adult Inpatient Plan of Care  Goal: Plan of Care Review  Outcome: Progressing  Goal: Patient-Specific Goal (Individualization)  Outcome: Progressing  Goal: Absence of Hospital-Acquired Illness or Injury  Outcome: Progressing  Goal: Optimal Comfort and Wellbeing  Outcome: Progressing  Goal: Readiness for Transition of Care  Outcome: Progressing  Goal: Rounds/Family Conference  Outcome: Progressing

## 2018-05-03 NOTE — Unmapped (Signed)
Pt endorses right sided pain in the rig cage, pt requesting this RN page admit team for pain meds. Pt informed that PRN pain meds are available, pt states those wont really can, can you page them for some more? Will page team regarding the patients requests.

## 2018-05-03 NOTE — Unmapped (Signed)
Calling back after several sick calls and an ER visit.     Patient with worsening cough and dyspnea with copious tracheal secretions with need for frequent suctioning. Despite aggressive airway clearance he has worsened. No fevers, but does have chills.     Doing duonebs, vest, hypertonic saline, also on 3rd day of doxycycline (picked it up on Thursday). Finished oral steroids on Friday.     Will arrange for direct admission to med G for aggressive airway clearance +/- IV antibiotics. Recommend obtaining sputum culture, respiratory pathogen panel. Uses trilogy vent at night. Further care as determined by inpatient team.

## 2018-05-04 LAB — CBC W/ AUTO DIFF
BASOPHILS ABSOLUTE COUNT: 0 10*9/L (ref 0.0–0.1)
BASOPHILS RELATIVE PERCENT: 0.4 %
EOSINOPHILS RELATIVE PERCENT: 1.1 %
HEMATOCRIT: 39 % — ABNORMAL LOW (ref 41.0–53.0)
HEMOGLOBIN: 12.4 g/dL — ABNORMAL LOW (ref 13.5–17.5)
LARGE UNSTAINED CELLS: 2 % (ref 0–4)
LYMPHOCYTES ABSOLUTE COUNT: 2.2 10*9/L (ref 1.5–5.0)
LYMPHOCYTES RELATIVE PERCENT: 26.5 %
MEAN CORPUSCULAR HEMOGLOBIN CONC: 31.8 g/dL (ref 31.0–37.0)
MEAN CORPUSCULAR HEMOGLOBIN: 25.1 pg — ABNORMAL LOW (ref 26.0–34.0)
MEAN CORPUSCULAR VOLUME: 78.9 fL — ABNORMAL LOW (ref 80.0–100.0)
MEAN PLATELET VOLUME: 7.9 fL (ref 7.0–10.0)
MONOCYTES ABSOLUTE COUNT: 0.4 10*9/L (ref 0.2–0.8)
MONOCYTES RELATIVE PERCENT: 5.1 %
NEUTROPHILS ABSOLUTE COUNT: 5.4 10*9/L (ref 2.0–7.5)
NEUTROPHILS RELATIVE PERCENT: 65.4 %
PLATELET COUNT: 386 10*9/L (ref 150–440)
RED BLOOD CELL COUNT: 4.94 10*12/L (ref 4.50–5.90)

## 2018-05-04 LAB — BASIC METABOLIC PANEL
ANION GAP: 14 mmol/L (ref 9–15)
BLOOD UREA NITROGEN: 7 mg/dL (ref 7–21)
BUN / CREAT RATIO: 10
CALCIUM: 9 mg/dL (ref 8.5–10.2)
CO2: 22 mmol/L (ref 22.0–30.0)
CREATININE: 0.73 mg/dL (ref 0.70–1.30)
EGFR MDRD AF AMER: >=60 mL/min/{1.73_m2} (ref >=60)
EGFR MDRD NON AF AMER: >=60 mL/min/{1.73_m2} (ref >=60)
GLUCOSE RANDOM: 175 mg/dL (ref 65–179)
POTASSIUM: 4.4 mmol/L (ref 3.5–5.0)
SODIUM: 139 mmol/L (ref 135–145)

## 2018-05-04 LAB — SODIUM: Sodium:SCnc:Pt:Ser/Plas:Qn:: 139

## 2018-05-04 LAB — MAGNESIUM: Magnesium:MCnc:Pt:Ser/Plas:Qn:: 1.6

## 2018-05-04 LAB — BASOPHILS RELATIVE PERCENT: Lab: 0.4

## 2018-05-04 LAB — SMEAR REVIEW

## 2018-05-04 NOTE — Unmapped (Signed)
Final lower respiratory culture #[id number] (tracheal aspirate) is reporting OROPHARYNGEAL FLORA ISOLATED.  Gram stain resulted <10  Epithelial cells per low power field, <10  PMNS/LPF, 1+ Gram negative bacilli and 1+ Gram positive bacilli.  Pt was discharged with a prescription for doxycycline (VIBRAMYCIN) 100 MG capsule, Take 1 capsule (100 mg total) by mouth Two (2) times a day for 10 days.  Pt was admitted 05/03/18.

## 2018-05-04 NOTE — Unmapped (Signed)
Vater syndrome, chronic trach w vent dependence, mood disorder, seizure   bivona, cuffless, #6.0 no inner cannula   RA   A/A/7% QID  Brovana BID  Pulm BID  metaneb for Airway clearnce QID

## 2018-05-04 NOTE — Unmapped (Addendum)
Pt AAOx4, had one episode early in evening of drowsiness and difficulty arousing. MD was paged. Pt stated he was extremely tired but was not responding to verbal communication, only physical stimulation. Pt has since been sleeping and gets very agitated when woken up. VSS stable in NSR, currently satting well on home vent. Medications given through [facility] button. One episode of N/V, PRN phenergan given, no complaints since. Fall and droplet/contact precautions maintained. Will continue to monitor.       Problem: Adult Inpatient Plan of Care  Goal: Plan of Care Review  Outcome: Progressing  Goal: Patient-Specific Goal (Individualization)  Outcome: Progressing  Goal: Absence of Hospital-Acquired Illness or Injury  Outcome: Progressing  Goal: Optimal Comfort and Wellbeing  Outcome: Progressing  Goal: Readiness for Transition of Care  Outcome: Progressing  Goal: Rounds/Family Conference  Outcome: Progressing

## 2018-05-04 NOTE — Unmapped (Signed)
Care Management  Initial Transition Planning Assessment    Per H&P: Cameron Bakeris a 27 y.o.??male??with PMHx of hypothyroidism, asthma, mood d/o and VATER syndrome s/p trach/peg w/ multiple recent admissions for tracheobronchitis that presented to [facility] with symptoms c/w Tracheobronchitis.  ??            General  Care Manager assessed the patient by : In person interview with patient, Medical record review, Discussion with Clinical Care team  Orientation Level: Oriented X4    Contact/Decision Maker        Extended Emergency Contact Information  Primary Emergency Contact: [name] [place] of [place]  Home Phone: [phone]  Mobile Phone: [phone]  Relation: Mother    Legal Next of Kin / Guardian / POA / Advance Directives       Advance Directive (Medical Treatment)  Does patient have an advance directive covering medical treatment?: Patient would not like information.  Reason there is not a Health Care Decision Maker appointed:: Patient does not wish to appoint a Health Care Decision Maker at this time  Information provided on advance directive:: No  Patient requests assistance:: No         Patient Information  Lives with: Friends    Type of Residence: Private residence     Type of Residence: Mailing Address:  [address]  [place] [address]  Contacts: Accompanied by: Alone  Password: [id number]  Patient Phone Number: [phone] (cell)          Medical Provider(s): Ander Slade, MD  Reason for Admission: Admitting Diagnosis:  Acute tracheobronchitis [J20.9]  SOB (shortness of breath) [R06.02]  Past Medical History:   has a past medical history of ADHD, Asthma, Bipolar 1 disorder (CMS-HCC), Chronic tracheobronchitis (CMS-HCC), Constipation, De Quervain's tenosynovitis, Dysphagia, Esophageal dysmotility, Esophageal stricture, GERD (gastroesophageal reflux disease), hypothyroid, Seizures (CMS-HCC), Tracheobronchomalacia, VATER syndrome, and Ventilator dependence (CMS-HCC).  Past Surgical History:   has a past surgical history that includes Tracheostomy (1994); Hip surgery (Right, 01/19/2017); Wrist surgery; Nissen fundoplication (2001); Anal dilation (01/21/2006); Portacath placement (01/21/2006); REMOVAL VENOUS ACCESS PORT [facility] HISTORICAL RESULT) (11/03/2006); Wisdom tooth extraction (04/2012); Lateral rectus recession (11/15/2003); Strabismus surgery (Right); Esophagogastroduodenoscopy; pr up gi endoscopy,ball dil,36mm (N/A, 02/22/2018); and pr esophageal motility study, manometry (N/A, 04/23/2018).   Previous admit date: 03/22/2018    Primary Insurance- Payor: Advertising copywriter MEDICARE ADV / Plan: UNITED HEALTHCARE DUAL COMPLETE HMO / Product Type: *No Product type* /   Secondary Insurance ??? Secondary Insurance  [place]ICAID [place]  Prescription Coverage ??? Yes  Preferred Pharmacy - [id number] - [place], [place] - [address] AT [facility] [address]  [id number] - [place], [place] - [address]  [facility] - [place], [place] - [address]    Transportation home: Bus vs Taxi depending on if busses are running  Level of function prior to admission: Independent. PDN at night while on vent          Location/Detail: friend's house 1st flr apt    Support Systems: Family Members, Friends/Neighbors    Responsibilities/Dependents at home?: No    Home Care services in place prior to admission?: Yes  Type of Home Care services in place prior to admission: Home health (specify)  Current Home Care provider (Name/Phone #): [facility] ([phone], F: [phone]). Pt reports he has private duty nursing Wed-Sat 7pm-7am; Sunday 11pm-7am with no nursing Monday  or Tuesday because that's when he goes out            Equipment Currently Used at Home: respiratory supplies(Pt reports he has a home vent, chest vest, nebulizer, suction machine. The vent is through [facility]. He states his primary pulmonoligist is working to get home a larger nebulizer and a cough assist. )  Current HME Agency (Name/Phone #): [facility] for vent and chest vest    Currently receiving outpatient dialysis?: No       Financial Information       Need for financial assistance?: No       Social Determinants of Health  Social Determinants of Health were addressed in provider documentation.  Please refer to patient history.    Discharge Needs Assessment  Concerns to be Addressed: denies needs/concerns at this time, no discharge needs identified    Clinical Risk Factors: Multiple Diagnoses (Chronic), Functional Limitations, Lives Alone or Absence of Caregiver to Assist with Discharge and Home Care    Barriers to taking medications: No    Prior overnight hospital stay or ED visit in last 90 days: Yes    Readmission Within the Last 30 Days: unable to assess         Anticipated Changes Related to Illness: none    Equipment Needed After Discharge: (TBD)    Discharge Facility/Level of Care Needs: (home to resume PDN)    Readmission  Risk of Unplanned Readmission Score: UNPLANNED READMISSION SCORE: 64%  Readmitted Within the Last 30 Days?   Patient at risk for readmission?: Yes    Discharge Plan  Screen findings are: Discharge planning needs identified or anticipated (Comment).    Expected Discharge Date: 06/03/18    Expected Transfer from Critical Care: 05/06/18    Patient and/or family were provided with choice of facilities / services that are available and appropriate to meet post hospital care needs?: Yes   List choices in order highest to lowest preferred, if applicable. : Continue with [facility] for PDN    Initial Assessment complete?: Yes

## 2018-05-04 NOTE — Unmapped (Signed)
Med Inpatient Service  Daily Progress Note     Interval/Overnight/Subjective:     [name]. He was sleeping comfortably this AM during rounds. No new complaints, but does have strong cough and asked to have HTS decreased from 7% to 3%. He has also asked to have SMOG performed (per his request). Will plan for either 7-day vs 10-day course of ABX. Will also need to decide whether PICC needed. Jodie is working on talking with DME company re: cough assist and Trilogy vent exchange.     After talking with his gastroenterologist (Dr. Wilford Corner), we will try to have Barium Swallow studies performed while inpatient (both timed & regular).     While on Doxy, we will have his home Minocycline held (takes for acne).     ASSESSMENT/PLAN:    Principal Problem:    Tracheobronchitis  Active Problems:    Asthma    Slow transit constipation    Dysphagia    Esophageal reflux    Hypothyroidism    Seizures (CMS-HCC)    Mood disorder (CMS-HCC)    Congenital tracheomalacia    Tracheostomy dependence (CMS-HCC)    VATER syndrome    Muscle spasms of both lower extremities    Dyspnea     LOS: 1 day     [name] is a 27 year old gentleman w/ VATER syndrome and recurrent bouts of tracheobronchitis presented here on 05/03/18 with ~1 week of cough, dyspnea, and increased sputum production.     Acute on Chronic Tracheobronchitis. 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 He experiences repeated episodes of tracheobronchitis as a result.  Over the past 12 months,??he??has been seen in the ED >10 times, mostly for exacerbations. Presented on 05/03/18 with symptoms c/w another exacerbation. RVP negative. CXR unremarkable. RVP was negative.   - f/u LRCx, BCx   - AC: Metanebs, Budesonide, Brovana, Duonebs, 3%HTS  - PO Doxy (5/27- )  - IV Ceftaz (5/27- )  - continue home Azithro MWF  - discontinued steroids   - talk to Jodie (clinic RT) regarding exchange of Trilogy vent and whether DME company delivered cough assist (w/ oscillatory function)  ??  VATER Syndrome. History of tracheobronchomalacia s/p tracheosotomy w/ chronic nocturnal ventilation (Trilogy vent). GI-related manifestations of the syndrome include GERD, esophageal dysmotility, and recurrent esophageal strictures with recent finding of type II achalasia. Follows with Dr. Wilford Corner [facility]).   - continue nightly Trilogy vent  - home meds for tracheobronchomalacia include Symbicort, Duonebs, Brovana  - Gabapentin   - Protonix 20mg  QD  - Zanaflex PRN  - ordered timed & regular Barium Swallow studies   ??  Bipolar 1 disorder, PTSD. Has followed [facility] and undergone psychotherapy previously.   - Synthroid  - Valproate   - Mirtazapine   ??  Hypothyroidism.   - Synthroid     Daily checklist:   Diet: regular  VTE ppx: Lovenox   GI ppx: home Protonix   Lytes: replete PRN to keep K>4 and Mag>2  IV Access: PIV  Code status: Full Code  Dispo: MedG, intermediate status  _____________________________________________________________________    Objective:     Vital Signs:  Temp:  [36.1 ??C-37 ??C] 37 ??C  Heart Rate:  [54-99] 99  SpO2 Pulse:  [57-99] 99  Resp:  [9-28] 28  BP: (96-140)/(51-80) 133/79  MAP (mmHg):  [68-98] 98  FiO2 (%):  [21 %] 21 %  SpO2:  [97 %-99 %] 99 %    Physical Exam:  GEN:  pleasant chronically ill-appearing Caucasian gentleman in NAD  HENT: MMM, oropharynx clear w/o erythema or ulcerations   CV: RRR w/ no murmurs heard  LUNGS: normal WOB, tracheostomy in place   ABD: +BS, soft, NT/ND, PEG in place   EXT: no peripheral edema, distal pulses intact   NEURO: grossly unremarkable  SKIN: no abrasions, erythema, or rashes appreciated    Diagnostic Studies:  I reviewed all pertinent diagnostic studies.  ______________    Silas Sacramento, MD  Internal Medicine PGY-1  Pager: [phone]

## 2018-05-04 NOTE — Unmapped (Signed)
I attempted to call [employer], but I was placed on hold for 50 minutes.  I sent a fax to [employer] informing them that Cameron Baker is currently admitted and requesting that the deliver the ordered Trilogy ventilator and cough assist ASAP.

## 2018-05-04 NOTE — Unmapped (Signed)
Pt is aao x4, on room air, uses vent at night with home setting. Walking around the floor with room air tolerated well. Complaints constipation, SMOC enema given, then had Bm. Uses urinal at the bedside. Plan for barium swallow tomorrow, NPO from MN for the same. Fall precaution maintained, lovenox order in place for VTE  prophylaxis. Keep monitoring.  Problem: Adult Inpatient Plan of Care  Goal: Plan of Care Review  Outcome: Progressing  Goal: Patient-Specific Goal (Individualization)  Outcome: Progressing  Goal: Absence of Hospital-Acquired Illness or Injury  Outcome: Progressing  Goal: Optimal Comfort and Wellbeing  Outcome: Progressing  Goal: Readiness for Transition of Care  Outcome: Progressing  Goal: Rounds/Family Conference  Outcome: Progressing     Problem: Device-Related Complication Risk (Mechanical Ventilation, Invasive)  Goal: Optimal Device Function  Outcome: Progressing     Problem: Skin and Tissue Injury (Mechanical Ventilation, Invasive)  Goal: Absence of Device-Related Skin and Tissue Injury  Outcome: Progressing     Problem: Venous Thromboembolism  Goal: VTE (Venous Thromboembolism) Symptom Resolution  Outcome: Progressing     Problem: Fall Injury Risk  Goal: Absence of Fall and Fall-Related Injury  Outcome: Progressing

## 2018-05-04 NOTE — Unmapped (Signed)
Pt. Sleeps on trilogy at night.

## 2018-05-05 DIAGNOSIS — R0602 Shortness of breath: Principal | ICD-10-CM

## 2018-05-05 LAB — CBC W/ AUTO DIFF
BASOPHILS ABSOLUTE COUNT: 0.1 10*9/L (ref 0.0–0.1)
BASOPHILS RELATIVE PERCENT: 0.6 %
EOSINOPHILS ABSOLUTE COUNT: 0.1 10*9/L (ref 0.0–0.4)
EOSINOPHILS RELATIVE PERCENT: 1.3 %
HEMATOCRIT: 38.7 % — ABNORMAL LOW (ref 41.0–53.0)
HEMOGLOBIN: 12.4 g/dL — ABNORMAL LOW (ref 13.5–17.5)
LARGE UNSTAINED CELLS: 2 % (ref 0–4)
LYMPHOCYTES ABSOLUTE COUNT: 2.7 10*9/L (ref 1.5–5.0)
MEAN CORPUSCULAR HEMOGLOBIN CONC: 32 g/dL (ref 31.0–37.0)
MEAN CORPUSCULAR HEMOGLOBIN: 24.6 pg — ABNORMAL LOW (ref 26.0–34.0)
MEAN CORPUSCULAR VOLUME: 76.9 fL — ABNORMAL LOW (ref 80.0–100.0)
MEAN PLATELET VOLUME: 7.4 fL (ref 7.0–10.0)
MONOCYTES ABSOLUTE COUNT: 0.5 10*9/L (ref 0.2–0.8)
MONOCYTES RELATIVE PERCENT: 6 %
NEUTROPHILS ABSOLUTE COUNT: 4.3 10*9/L (ref 2.0–7.5)
NEUTROPHILS RELATIVE PERCENT: 55.3 %
PLATELET COUNT: 500 10*9/L — ABNORMAL HIGH (ref 150–440)
RED BLOOD CELL COUNT: 5.03 10*12/L (ref 4.50–5.90)
RED CELL DISTRIBUTION WIDTH: 15.3 % — ABNORMAL HIGH (ref 12.0–15.0)
WBC ADJUSTED: 7.8 10*9/L (ref 4.5–11.0)

## 2018-05-05 LAB — BASIC METABOLIC PANEL
ANION GAP: 7 mmol/L — ABNORMAL LOW (ref 9–15)
BLOOD UREA NITROGEN: 9 mg/dL (ref 7–21)
CALCIUM: 10 mg/dL (ref 8.5–10.2)
CHLORIDE: 105 mmol/L (ref 98–107)
CO2: 27 mmol/L (ref 22.0–30.0)
CREATININE: 0.7 mg/dL (ref 0.70–1.30)
EGFR MDRD AF AMER: >=60 mL/min/{1.73_m2} (ref >=60)
EGFR MDRD NON AF AMER: >=60 mL/min/{1.73_m2} (ref >=60)
GLUCOSE RANDOM: 107 mg/dL (ref 65–179)
POTASSIUM: 4.6 mmol/L (ref 3.5–5.0)
SODIUM: 139 mmol/L (ref 135–145)

## 2018-05-05 LAB — MAGNESIUM: Magnesium:MCnc:Pt:Ser/Plas:Qn:: 1.6

## 2018-05-05 LAB — CO2: Carbon dioxide:SCnc:Pt:Ser/Plas:Qn:: 27

## 2018-05-05 LAB — MEAN PLATELET VOLUME: Lab: 7.4

## 2018-05-05 NOTE — Unmapped (Signed)
Patient A&Ox4, VSS with #6 Bovona trach on room air when awake and home vent 21% when asleep. Pain controlled with PRN oxycodone x1. Phernergen given PRN for nausea with good relief and PRN zanaflex given for muscle spasms. NPO at midnight for barium swallow during day. Pt able to turn self in bed. Adequate urine output. 1 BM overnight. Pt OOB independently to bathroom. No acute events during shift. Will continue to monitor and maintain safety.   Problem: Adult Inpatient Plan of Care  Goal: Plan of Care Review  Outcome: Progressing  Goal: Patient-Specific Goal (Individualization)  Outcome: Progressing  Goal: Absence of Hospital-Acquired Illness or Injury  Outcome: Progressing  Goal: Optimal Comfort and Wellbeing  Outcome: Progressing  Goal: Readiness for Transition of Care  Outcome: Progressing  Goal: Rounds/Family Conference  Outcome: Progressing     Problem: Device-Related Complication Risk (Mechanical Ventilation, Invasive)  Goal: Optimal Device Function  Outcome: Progressing     Problem: Skin and Tissue Injury (Mechanical Ventilation, Invasive)  Goal: Absence of Device-Related Skin and Tissue Injury  Outcome: Progressing     Problem: Venous Thromboembolism  Goal: VTE (Venous Thromboembolism) Symptom Resolution  Outcome: Progressing     Problem: Fall Injury Risk  Goal: Absence of Fall and Fall-Related Injury  Outcome: Progressing

## 2018-05-05 NOTE — Unmapped (Signed)
I was able to get in contact with Cameron Baker from [employer], she stated that [employer] was concerned the Cameron Baker did not have a stable home environment and that they needed confirmation that Cameron Baker had a permanent home address.  Cameron Baker also needed to know about additional care givers that were accessible to Cameron Baker.  I went down and spoke with Cameron Baker.  He states that he has a stable home and that he has a home care nurse.  I gave him Cameron Baker's phone number and asked him to contact her to discuss his home environment with Cameron Baker.   I faxed Cameron Baker's facesheet, the Trilogy vent orders and the cough assist orders to [employer] again, per their request.  Dr. Garner Nash signed the CMN/PA forms for both devices and I faxed the CMN's to [employer].  I returned to Cameron Baker room.  He stated that he spoke with Cameron Baker and everything was a go.  I will contact [employer] tomorrow to verify that they have all of the information that they need and to get a tentative set-up time for the trilogy and the cough assist.

## 2018-05-05 NOTE — Unmapped (Signed)
Pt on vent overnight. Pt tolerated breathing treatment well with no complication. Trach care done with no complication. Airway patent and secure. Wean as tolerated. Will continue to monitor.   Problem: Device-Related Complication Risk (Mechanical Ventilation, Invasive)  Goal: Optimal Device Function  Outcome: Progressing

## 2018-05-05 NOTE — Unmapped (Signed)
Med Inpatient Service  Daily Progress Note     Interval/Overnight/Subjective:     [name]. SMOG was successful in producing a bowel movement yesterday afternoon. No new complaints this AM. Had Barium swallow studies completed this AM, but results still pending. Will plan to complete course of ABX (IV Ceftazidime & PO Doxy) for 10d course (end date is 05/12/18). Will place PICC consult due to frequent lab draws in setting of poor venous access. Cameron did inquire about having chest port placed for convenience given his multiple hospitalizations requiring blood draws.     ASSESSMENT/PLAN:    Principal Problem:    Tracheobronchitis  Active Problems:    Asthma    Slow transit constipation    Dysphagia    Esophageal reflux    Hypothyroidism    Seizures (CMS-HCC)    Mood disorder (CMS-HCC)    Congenital tracheomalacia    Tracheostomy dependence (CMS-HCC)    VATER syndrome    Muscle spasms of both lower extremities    Dyspnea     LOS: 2 days     Cameron Baker is a 27 year old gentleman w/ VATER syndrome and recurrent bouts of tracheobronchitis presented here on 05/03/18 with ~1 week of cough, dyspnea, and increased sputum production.     Acute on Chronic Tracheobronchitis. 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 He experiences repeated episodes of tracheobronchitis as a result.  Over the past 12 months,??he??has been seen in the ED >10 times, mostly for exacerbations. Presented on 05/03/18 with symptoms c/w another exacerbation. RVP negative. CXR unremarkable. RVP was negative.   - f/u LRCx, BCx   - AC: Metanebs, Budesonide, Brovana, Duonebs, 3%HTS  - PO Doxy (5/27-6/5)  - IV Ceftaz (5/27-6/5)  - continue home Azithro MWF  - discontinued steroids   - talk to Jodie (clinic RT) regarding exchange of Trilogy vent and whether DME company delivered cough assist (w/ oscillatory function)  ??  VATER Syndrome. History of tracheobronchomalacia s/p tracheosotomy w/ chronic nocturnal ventilation (Trilogy vent). GI-related manifestations of the syndrome include GERD, esophageal dysmotility, and recurrent esophageal strictures with recent finding of type II achalasia. Follows with Dr. Wilford Corner [facility]).   - continue nightly Trilogy vent  - home meds for tracheobronchomalacia include Symbicort, Duonebs, Brovana  - Gabapentin   - Protonix 20mg  QD  - Zanaflex PRN  - f/u results of timed & regular Barium Swallow studies   - SMOG enema QD PRN for constipation  - Zofran & Phenergan alternated PRN for N/V  ??  Bipolar 1 disorder, PTSD. Has followed [facility] and undergone psychotherapy previously.   - Synthroid  - Valproate   - Mirtazapine   ??  Hypothyroidism.   - Synthroid     Acne.  - home Minocycline 100mg  QHS held while on Doxycycline     Daily checklist:   Diet: regular  VTE ppx: Lovenox   GI ppx: home Protonix   Lytes: replete PRN to keep K>4 and Mag>2  IV Access: PIV  Code status: Full Code  Dispo: MedG, intermediate status  _____________________________________________________________________    Objective:     Vital Signs:  Temp:  [36.6 ??C-37.5 ??C] 36.7 ??C  Heart Rate:  [67-102] 67  SpO2 Pulse:  [68-99] 68  Resp:  [9-28] 12  BP: (100-148)/(48-80) 100/48  MAP (mmHg):  [63-102] 63  FiO2 (%):  [21 %] 21 %  SpO2:  [96 %-100 %] 98 %    Physical Exam:  GEN: pleasant chronically ill-appearing Caucasian  gentleman in NAD  CV: RRR w/ no murmurs heard  LUNGS: normal WOB, tracheostomy in place   ABD: +BS, soft, NT/ND, PEG in place   EXT: no peripheral edema, distal pulses intact   NEURO: grossly unremarkable    Diagnostic Studies:  I reviewed all pertinent diagnostic studies.  ______________    Silas Sacramento, MD  Internal Medicine PGY-1  Pager: [phone]

## 2018-05-05 NOTE — Unmapped (Signed)
Pt a/ox4. VSS. Home vent o/n - TC 28% during day. Pt down for barium swallow study. Discussed PICC vs Port w/team for continuing of care/long term care goals. Redness remains around [place] button. Pt amb independently. PIV positional. Pt free of falls this shift. Continuing to monitor.    Problem: Adult Inpatient Plan of Care  Goal: Plan of Care Review  Outcome: Progressing  Goal: Patient-Specific Goal (Individualization)  Outcome: Progressing  Goal: Absence of Hospital-Acquired Illness or Injury  Outcome: Progressing  Goal: Optimal Comfort and Wellbeing  Outcome: Progressing  Goal: Readiness for Transition of Care  Outcome: Progressing  Goal: Rounds/Family Conference  Outcome: Progressing     Problem: Device-Related Complication Risk (Mechanical Ventilation, Invasive)  Goal: Optimal Device Function  Outcome: Progressing     Problem: Skin and Tissue Injury (Mechanical Ventilation, Invasive)  Goal: Absence of Device-Related Skin and Tissue Injury  Outcome: Progressing     Problem: Venous Thromboembolism  Goal: VTE (Venous Thromboembolism) Symptom Resolution  Outcome: Progressing     Problem: Fall Injury Risk  Goal: Absence of Fall and Fall-Related Injury  Outcome: Progressing

## 2018-05-05 NOTE — Unmapped (Signed)
PICC LINE INSERTION PROCEDURE NOTE    Indications:  Antibiotic Therapy    Consent/Time Out:    Risks, benefits and alternatives discussed with patient.  Written consent was obtained prior to the procedure and is detailed in the medical record.  Prior to the start of the procedure, a time out was taken and the identity of the patient was confirmed via name, medical record number and  date of birth.  The availability of the correct equipment was verified.    Procedure Details:  The vein was identified and measured for appropriate catheter length. Maximum sterile techniques was utilized.  Sterile field prepared with necessary supplies and equipment. Insertion site was prepped with chlorhexidine solution and allowed to dry. Lidocaine 2 mL subcutaneously and intradermally administered to insertion site.  The catheter was primed with normal saline.  A 4 FR Single lumen was inserted to the R Basilic vein with 1 insertion attempt(s).  Catheter aspirated, 5 mL blood return present.  The catheter was then flushed with 10 mL of normal saline.   Insertion site cleansed, and sterile dressing applied per manufacturer guidelines. The Central Line Checklist was referenced.  The catheter was inserted without difficulty by Gillie Manners RN and assisted by Thelma Barge RN.      Findings:  Manufacturer:  [employer]  Lot #:  [id number]  CT Injectable (power):  Yes  Total catheter length:  38 cm.    Catheter left at:  0 cm.  Catheter trimmed:  yes  Port reserved:  No       Vein Size:   Right arm basilic vein compressible:  Yes, measurement:  (see image)      Tip Placement Verification:   3[employer]    Workup Time:    75 minutes    Recommendations:  PICC Brochure given to patient with teaching instruction.      See vein image below:

## 2018-05-06 LAB — BASIC METABOLIC PANEL
BUN / CREAT RATIO: 13
CALCIUM: 9.5 mg/dL (ref 8.5–10.2)
CHLORIDE: 101 mmol/L (ref 98–107)
CO2: 28 mmol/L (ref 22.0–30.0)
CREATININE: 0.71 mg/dL (ref 0.70–1.30)
EGFR MDRD AF AMER: >=60 mL/min/{1.73_m2} (ref >=60)
EGFR MDRD NON AF AMER: >=60 mL/min/{1.73_m2} (ref >=60)
GLUCOSE RANDOM: 225 mg/dL — ABNORMAL HIGH (ref 65–179)
POTASSIUM: 4.1 mmol/L (ref 3.5–5.0)
SODIUM: 139 mmol/L (ref 135–145)

## 2018-05-06 LAB — CBC W/ AUTO DIFF
BASOPHILS ABSOLUTE COUNT: 0 10*9/L (ref 0.0–0.1)
BASOPHILS RELATIVE PERCENT: 0.4 %
EOSINOPHILS RELATIVE PERCENT: 1.6 %
HEMATOCRIT: 38.8 % — ABNORMAL LOW (ref 41.0–53.0)
HEMOGLOBIN: 12.1 g/dL — ABNORMAL LOW (ref 13.5–17.5)
LARGE UNSTAINED CELLS: 2 % (ref 0–4)
LYMPHOCYTES ABSOLUTE COUNT: 2.3 10*9/L (ref 1.5–5.0)
LYMPHOCYTES RELATIVE PERCENT: 30 %
MEAN CORPUSCULAR HEMOGLOBIN CONC: 31.3 g/dL (ref 31.0–37.0)
MEAN CORPUSCULAR HEMOGLOBIN: 25.1 pg — ABNORMAL LOW (ref 26.0–34.0)
MEAN CORPUSCULAR VOLUME: 80.2 fL (ref 80.0–100.0)
MEAN PLATELET VOLUME: 7.5 fL (ref 7.0–10.0)
MONOCYTES ABSOLUTE COUNT: 0.3 10*9/L (ref 0.2–0.8)
MONOCYTES RELATIVE PERCENT: 4.5 %
NEUTROPHILS ABSOLUTE COUNT: 4.7 10*9/L (ref 2.0–7.5)
NEUTROPHILS RELATIVE PERCENT: 61.9 %
PLATELET COUNT: 422 10*9/L (ref 150–440)
RED BLOOD CELL COUNT: 4.84 10*12/L (ref 4.50–5.90)
RED CELL DISTRIBUTION WIDTH: 14.8 % (ref 12.0–15.0)

## 2018-05-06 LAB — MAGNESIUM: Magnesium:MCnc:Pt:Ser/Plas:Qn:: 1.5 — ABNORMAL LOW

## 2018-05-06 LAB — HEMOGLOBIN A1C: HEMOGLOBIN A1C: 5.5 % (ref 4.8–5.6)

## 2018-05-06 LAB — MEAN PLATELET VOLUME: Lab: 7.5

## 2018-05-06 LAB — BUN / CREAT RATIO: Urea nitrogen/Creatinine:MRto:Pt:Ser/Plas:Qn:: 13

## 2018-05-06 LAB — ESTIMATED AVERAGE GLUCOSE: Estimated average glucose:MCnc:Pt:Bld:Qn:Estimated from glycated hemoglobin: 111

## 2018-05-06 NOTE — Unmapped (Signed)
[employer] called to say that they have corporate approval for his DME equipment.  I spoke with [name], he will coordinate a time for his nurse to be trained on the Trilogy.  After that is complete, [employer] can deliver the equipment.

## 2018-05-06 NOTE — Unmapped (Signed)
Med Inpatient Service  Daily Progress Note     Interval/Overnight/Subjective:     [name]. Underwent Barium swallow studies that further corroborate diagnosis of Type II Achalasia. PICC was placed yesterday afternoon. Will place VIR outpatient referral to have chest port placed. Due to hyperglycemia on this AM's labs, HbA1c was checked, but returned normal. No changes in plan to continue ABX course (IV Ceftazidime & PO Doxy) as inpatient until 05/12/18.     ASSESSMENT/PLAN:    Principal Problem:    Tracheobronchitis  Active Problems:    Asthma    Slow transit constipation    Dysphagia    Esophageal reflux    Hypothyroidism    Seizures (CMS-HCC)    Mood disorder (CMS-HCC)    Congenital tracheomalacia    Tracheostomy dependence (CMS-HCC)    VATER syndrome    Muscle spasms of both lower extremities    Dyspnea     LOS: 3 days     [name] is a 27 year old gentleman w/ VATER syndrome and recurrent bouts of tracheobronchitis presented here on 05/03/18 with ~1 week of cough, dyspnea, and increased sputum production.     Acute on Chronic Tracheobronchitis. 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 He experiences repeated episodes of tracheobronchitis as a result.  Over the past 12 months,??he??has been seen in the ED >10 times, mostly for exacerbations. Presented on 05/03/18 with symptoms c/w another exacerbation. RVP negative. CXR unremarkable. RVP was negative. LRCx have shown OPF and BCx without growth.   - AC: Metanebs, Budesonide, Brovana, Duonebs, 3%HTS  - PO Doxy (5/27-6/5)  - IV Ceftaz (5/27-6/5)  - continue home Azithro MWF  - discontinued steroids   - talk to Jodie (clinic RT) regarding exchange of Trilogy vent and whether DME company delivered cough assist (w/ oscillatory function)  ??  VATER Syndrome. History of tracheobronchomalacia s/p tracheosotomy w/ chronic nocturnal ventilation (Trilogy vent). GI-related manifestations of the syndrome include GERD, esophageal dysmotility, and recurrent esophageal strictures with recent finding of type II achalasia. Follows with Dr. Wilford Corner [facility]).   - continue nightly Trilogy vent  - home meds for tracheobronchomalacia include Symbicort, Duonebs, Brovana  - Gabapentin   - Protonix 20mg  QD  - Zanaflex PRN  - f/u results of timed & regular Barium Swallow studies   - SMOG enema QD PRN for constipation  - Zofran & Phenergan alternated PRN for N/V  ??  Bipolar 1 disorder, PTSD: Has followed [facility] and undergone psychotherapy previously. Continue Celexa, Valproate, and Mirtazapine.  ??  Hypothyroidism: Synthroid.    Acne: home Minocycline 100mg  QHS held while on Doxycycline     Daily checklist:   Diet: regular  VTE ppx: Lovenox   GI ppx: home Protonix   Lytes: replete PRN to keep K>4 and Mag>2  IV Access: PIV  Code status: Full Code  Dispo: MedG, intermediate status  _____________________________________________________________________    Objective:     Vital Signs:  Temp:  [36.4 ??C-37.6 ??C] 36.5 ??C  Heart Rate:  [66-94] 90  SpO2 Pulse:  [65-89] 80  Resp:  [9-19] 16  BP: (114-149)/(40-99) 127/52  MAP (mmHg):  [59-115] 76  FiO2 (%):  [21 %-28 %] 21 %  SpO2:  [96 %-100 %] 98 %    Physical Exam:  GEN: pleasant chronically ill-appearing Caucasian gentleman in NAD  CV: RRR w/ no murmurs heard  LUNGS: normal WOB, tracheostomy in place   ABD: +BS, soft, NT/ND, PEG in place   EXT: no  peripheral edema, distal pulses intact   NEURO: grossly unremarkable    Diagnostic Studies:  I reviewed all pertinent diagnostic studies.  ______________    Silas Sacramento, MD  Internal Medicine PGY-1  Pager: [phone]

## 2018-05-06 NOTE — Unmapped (Signed)
Patient A&Ox4, VSS with #6 Bovona trach on room air when awake and home vent 21% when asleep. Pain controlled with PRN oxycodone x1. Phernergen given x2 PRN for nausea with good relief. Pt able to turn self in bed. Adequate urine output. Smog enema given in beginning of shift per pt request with immediate results.  Adequate urine output. Pt OOB independently to bathroom. No acute events during shift. Will continue to monitor and maintain safety.   Problem: Adult Inpatient Plan of Care  Goal: Plan of Care Review  Outcome: Progressing  Goal: Patient-Specific Goal (Individualization)  Outcome: Progressing  Goal: Absence of Hospital-Acquired Illness or Injury  Outcome: Progressing  Goal: Optimal Comfort and Wellbeing  Outcome: Progressing  Goal: Readiness for Transition of Care  Outcome: Progressing  Goal: Rounds/Family Conference  Outcome: Progressing     Problem: Device-Related Complication Risk (Mechanical Ventilation, Invasive)  Goal: Optimal Device Function  Outcome: Progressing     Problem: Skin and Tissue Injury (Mechanical Ventilation, Invasive)  Goal: Absence of Device-Related Skin and Tissue Injury  Outcome: Progressing     Problem: Venous Thromboembolism  Goal: VTE (Venous Thromboembolism) Symptom Resolution  Outcome: Progressing     Problem: Fall Injury Risk  Goal: Absence of Fall and Fall-Related Injury  Outcome: Progressing

## 2018-05-06 NOTE — Unmapped (Signed)
Pt is AAO x 4, on room air uses vent at night. Aferbrile, Stable vitals, denies any pain during this shift. Complaints nausea phenergan given, verbalized symptom relief. Uses urinal to void, no BM so far. Vitals changed to q shift. Fall precaution maintained, lovenox order in place for VTE prophylaxis. Keep monitoring.   Problem: Adult Inpatient Plan of Care  Goal: Plan of Care Review  Outcome: Progressing  Goal: Patient-Specific Goal (Individualization)  Outcome: Progressing  Goal: Absence of Hospital-Acquired Illness or Injury  Outcome: Progressing  Goal: Optimal Comfort and Wellbeing  Outcome: Progressing  Goal: Readiness for Transition of Care  Outcome: Progressing  Goal: Rounds/Family Conference  Outcome: Progressing     Problem: Device-Related Complication Risk (Mechanical Ventilation, Invasive)  Goal: Optimal Device Function  Outcome: Progressing     Problem: Skin and Tissue Injury (Mechanical Ventilation, Invasive)  Goal: Absence of Device-Related Skin and Tissue Injury  Outcome: Progressing     Problem: Venous Thromboembolism  Goal: VTE (Venous Thromboembolism) Symptom Resolution  Outcome: Progressing     Problem: Fall Injury Risk  Goal: Absence of Fall and Fall-Related Injury  Outcome: Progressing

## 2018-05-06 NOTE — Unmapped (Signed)
I spoke with [name] at [employer], she confirmed that she spoke with [name] on 05/05/18, she received all orders and CMN's and has submitted the information to [employer] corporate offices.  [name] is waiting for approval from corporate offices to deliver the DME.  She will nitify me when she hears back from [employer] corporate.

## 2018-05-07 LAB — MAGNESIUM: Magnesium:MCnc:Pt:Ser/Plas:Qn:: 1.6

## 2018-05-07 LAB — CBC W/ AUTO DIFF
BASOPHILS ABSOLUTE COUNT: 0 10*9/L (ref 0.0–0.1)
BASOPHILS RELATIVE PERCENT: 0.5 %
EOSINOPHILS ABSOLUTE COUNT: 0.2 10*9/L (ref 0.0–0.4)
EOSINOPHILS RELATIVE PERCENT: 2 %
HEMOGLOBIN: 12.3 g/dL — ABNORMAL LOW (ref 13.5–17.5)
LARGE UNSTAINED CELLS: 2 % (ref 0–4)
LYMPHOCYTES ABSOLUTE COUNT: 2.6 10*9/L (ref 1.5–5.0)
LYMPHOCYTES RELATIVE PERCENT: 33.6 %
MEAN CORPUSCULAR HEMOGLOBIN CONC: 32.5 g/dL (ref 31.0–37.0)
MEAN CORPUSCULAR HEMOGLOBIN: 25.1 pg — ABNORMAL LOW (ref 26.0–34.0)
MEAN CORPUSCULAR VOLUME: 77.1 fL — ABNORMAL LOW (ref 80.0–100.0)
MEAN PLATELET VOLUME: 7 fL (ref 7.0–10.0)
MONOCYTES ABSOLUTE COUNT: 0.5 10*9/L (ref 0.2–0.8)
MONOCYTES RELATIVE PERCENT: 7.1 %
NEUTROPHILS ABSOLUTE COUNT: 4.2 10*9/L (ref 2.0–7.5)
NEUTROPHILS RELATIVE PERCENT: 55.1 %
PLATELET COUNT: 470 10*9/L — ABNORMAL HIGH (ref 150–440)
RED BLOOD CELL COUNT: 4.9 10*12/L (ref 4.50–5.90)
WBC ADJUSTED: 7.6 10*9/L (ref 4.5–11.0)

## 2018-05-07 LAB — BASIC METABOLIC PANEL
ANION GAP: 8 mmol/L — ABNORMAL LOW (ref 9–15)
BLOOD UREA NITROGEN: 8 mg/dL (ref 7–21)
BUN / CREAT RATIO: 13
CALCIUM: 9.7 mg/dL (ref 8.5–10.2)
CHLORIDE: 101 mmol/L (ref 98–107)
CO2: 31 mmol/L — ABNORMAL HIGH (ref 22.0–30.0)
CREATININE: 0.61 mg/dL — ABNORMAL LOW (ref 0.70–1.30)
EGFR MDRD AF AMER: >=60 mL/min/{1.73_m2} (ref >=60)
POTASSIUM: 4.3 mmol/L (ref 3.5–5.0)
SODIUM: 140 mmol/L (ref 135–145)

## 2018-05-07 LAB — PLATELET COUNT: Lab: 470 — ABNORMAL HIGH

## 2018-05-07 LAB — EGFR MDRD AF AMER: Glomerular filtration rate/1.73 sq M.predicted.black:ArVRat:Pt:Ser/Plas/Bld:Qn:Creatinine-based formula (MDRD): >=60

## 2018-05-07 NOTE — Unmapped (Signed)
Med Inpatient Service  Daily Progress Note     Interval/Overnight/Subjective:     [name]. Sleepy this morning but denied increased in cough or sputum production. Patient had a bowel movement. HbA1c normal. No changes in plan to continue ABX course (IV Ceftazidime & PO Doxy) as inpatient until 05/12/18. Patient's nurse will be trained on trilogy after which [employer] will deliver DME equipment.     ASSESSMENT/PLAN:    Principal Problem:    Tracheobronchitis  Active Problems:    Asthma    Slow transit constipation    Dysphagia    Esophageal reflux    Hypothyroidism    Seizures (CMS-HCC)    Mood disorder (CMS-HCC)    Congenital tracheomalacia    Tracheostomy dependence (CMS-HCC)    VATER syndrome    Muscle spasms of both lower extremities    Dyspnea     LOS: 4 days     [name] is a 27 year old gentleman w/ VATER syndrome and recurrent bouts of tracheobronchitis presented here on 05/03/18 with ~1 week of cough, dyspnea, and increased sputum production.     Acute on Chronic Tracheobronchitis. 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 He experiences repeated episodes of tracheobronchitis as a result.  Over the past 12 months,??he??has been seen in the ED >10 times, mostly for exacerbations. Presented on 05/03/18 with symptoms c/w another exacerbation. RVP negative. CXR unremarkable. RVP was negative. LRCx have shown OPF and BCx without growth.   - AC: Metanebs, Budesonide, Brovana, Duonebs, 3%HTS  - PO Doxy (5/27-6/5)  - IV Ceftaz (5/27-6/5)  - continue home Azithro MWF  - discontinued steroids   - talk to Jodie (clinic RT) regarding exchange of Trilogy vent and whether DME company delivered cough assist (w/ oscillatory function)  ??  VATER Syndrome. History of tracheobronchomalacia s/p tracheosotomy w/ chronic nocturnal ventilation (Trilogy vent). GI-related manifestations of the syndrome include GERD, esophageal dysmotility, and recurrent esophageal strictures with recent finding of type II achalasia. Follows with Dr. Wilford Corner [facility]).   - continue nightly Trilogy vent  - home meds for tracheobronchomalacia include Symbicort, Duonebs, Brovana  - Gabapentin   - Protonix 20mg  QD  - Zanaflex PRN  - f/u results of timed & regular Barium Swallow studies   - SMOG enema QD PRN for constipation  - Zofran & Phenergan alternated PRN for N/V  ??  Bipolar 1 disorder, PTSD: Has followed [facility] and undergone psychotherapy previously. Continue Celexa, Valproate, and Mirtazapine.  ??  Hypothyroidism: Synthroid.    Acne: home Minocycline 100mg  QHS held while on Doxycycline     Daily checklist:   Diet: regular  VTE ppx: Lovenox   GI ppx: home Protonix   Lytes: replete PRN to keep K>4 and Mag>2  IV Access: PIV  Code status: Full Code  Dispo: [facility], intermediate status  _____________________________________________________________________    Objective:     Vital Signs:  Temp:  [36.5 ??C-37.4 ??C] 37.4 ??C  Heart Rate:  [72-90] 72  SpO2 Pulse:  [78] 78  Resp:  [10-16] 10  BP: (99-146)/(43-79) 99/43  MAP (mmHg):  [64-97] 64  FiO2 (%):  [21 %] 21 %  SpO2:  [97 %-98 %] 97 %    Physical Exam:  GEN: pleasant chronically ill-appearing Caucasian gentleman in NAD  CV: RRR w/ no murmurs heard  LUNGS: normal WOB, tracheostomy in place  ABD: +BS, soft, NT/ND, PEG in place   EXT: no peripheral edema, distal pulses intact   NEURO: grossly unremarkable  Diagnostic Studies:  I reviewed all pertinent diagnostic studies.  ______________

## 2018-05-07 NOTE — Unmapped (Signed)
Pt is AAO x 4, on room air during this shift. Stable vitals. Pt walks around the unit, and have an order to OK to go off unit for an hour. Complaints pain in his rt chest relieved with oxycontin. Lovenox order in place for VTE prophylaxis. Fall precaution maintained. Keep monitoring    Problem: Adult Inpatient Plan of Care  Goal: Plan of Care Review  Outcome: Progressing  Goal: Patient-Specific Goal (Individualization)  Outcome: Progressing  Goal: Absence of Hospital-Acquired Illness or Injury  Outcome: Progressing  Goal: Optimal Comfort and Wellbeing  Outcome: Progressing  Goal: Readiness for Transition of Care  Outcome: Progressing  Goal: Rounds/Family Conference  Outcome: Progressing     Problem: Device-Related Complication Risk (Mechanical Ventilation, Invasive)  Goal: Optimal Device Function  Outcome: Progressing     Problem: Skin and Tissue Injury (Mechanical Ventilation, Invasive)  Goal: Absence of Device-Related Skin and Tissue Injury  Outcome: Progressing     Problem: Venous Thromboembolism  Goal: VTE (Venous Thromboembolism) Symptom Resolution  Outcome: Progressing     Problem: Fall Injury Risk  Goal: Absence of Fall and Fall-Related Injury  Outcome: Progressing

## 2018-05-07 NOTE — Unmapped (Signed)
Pt aox4; can be very drowsy while sleeping. VSS; pt on room air during the day and home vent at night. Pt has not c/o pain; has had adequate urine output. Pt has had adequate PO intake. Pt requested SMOG enema for 2030 but was asleep by the time it got here and did not request it again. No bm this shift. Pt has not had any visitors, call light and bedside table within reach, free from falls/injuries, q2 rounds performed; will continue to monitor.        Problem: Adult Inpatient Plan of Care  Goal: Plan of Care Review  Outcome: Progressing  Goal: Patient-Specific Goal (Individualization)  Outcome: Progressing  Goal: Absence of Hospital-Acquired Illness or Injury  Outcome: Progressing  Goal: Optimal Comfort and Wellbeing  Outcome: Progressing  Goal: Readiness for Transition of Care  Outcome: Progressing  Goal: Rounds/Family Conference  Outcome: Progressing     Problem: Device-Related Complication Risk (Mechanical Ventilation, Invasive)  Goal: Optimal Device Function  Outcome: Progressing     Problem: Skin and Tissue Injury (Mechanical Ventilation, Invasive)  Goal: Absence of Device-Related Skin and Tissue Injury  Outcome: Progressing     Problem: Venous Thromboembolism  Goal: VTE (Venous Thromboembolism) Symptom Resolution  Outcome: Progressing     Problem: Fall Injury Risk  Goal: Absence of Fall and Fall-Related Injury  Outcome: Progressing

## 2018-05-07 NOTE — Unmapped (Signed)
Patient received all scheduled respiratory medications this shift. Used metaneb. Placed on trilogy overnight. Patient does self sxn and trach care.

## 2018-05-08 LAB — HYPOCHROMIA

## 2018-05-08 LAB — CBC W/ AUTO DIFF
BASOPHILS ABSOLUTE COUNT: 0 10*9/L (ref 0.0–0.1)
BASOPHILS RELATIVE PERCENT: 0.4 %
EOSINOPHILS ABSOLUTE COUNT: 0.2 10*9/L (ref 0.0–0.4)
EOSINOPHILS RELATIVE PERCENT: 2 %
HEMATOCRIT: 38.8 % — ABNORMAL LOW (ref 41.0–53.0)
HEMOGLOBIN: 12.3 g/dL — ABNORMAL LOW (ref 13.5–17.5)
LARGE UNSTAINED CELLS: 2 % (ref 0–4)
LYMPHOCYTES ABSOLUTE COUNT: 2.1 10*9/L (ref 1.5–5.0)
MEAN CORPUSCULAR HEMOGLOBIN CONC: 31.7 g/dL (ref 31.0–37.0)
MEAN CORPUSCULAR HEMOGLOBIN: 24.9 pg — ABNORMAL LOW (ref 26.0–34.0)
MEAN CORPUSCULAR VOLUME: 78.4 fL — ABNORMAL LOW (ref 80.0–100.0)
MEAN PLATELET VOLUME: 7.2 fL (ref 7.0–10.0)
MONOCYTES ABSOLUTE COUNT: 0.5 10*9/L (ref 0.2–0.8)
MONOCYTES RELATIVE PERCENT: 6.7 %
NEUTROPHILS ABSOLUTE COUNT: 4.5 10*9/L (ref 2.0–7.5)
NEUTROPHILS RELATIVE PERCENT: 60.5 %
RED BLOOD CELL COUNT: 4.94 10*12/L (ref 4.50–5.90)
RED CELL DISTRIBUTION WIDTH: 15.2 % — ABNORMAL HIGH (ref 12.0–15.0)

## 2018-05-08 LAB — MAGNESIUM: Magnesium:MCnc:Pt:Ser/Plas:Qn:: 1.5 — ABNORMAL LOW

## 2018-05-08 LAB — GLUCOSE RANDOM: Glucose:MCnc:Pt:Ser/Plas:Qn:: 203 — ABNORMAL HIGH

## 2018-05-08 LAB — BASIC METABOLIC PANEL
ANION GAP: 10 mmol/L (ref 9–15)
BLOOD UREA NITROGEN: 7 mg/dL (ref 7–21)
BUN / CREAT RATIO: 11
CALCIUM: 9.5 mg/dL (ref 8.5–10.2)
CHLORIDE: 98 mmol/L (ref 98–107)
CO2: 31 mmol/L — ABNORMAL HIGH (ref 22.0–30.0)
CREATININE: 0.61 mg/dL — ABNORMAL LOW (ref 0.70–1.30)
EGFR MDRD AF AMER: >=60 mL/min/{1.73_m2} (ref >=60)
EGFR MDRD NON AF AMER: >=60 mL/min/{1.73_m2} (ref >=60)
GLUCOSE RANDOM: 203 mg/dL — ABNORMAL HIGH (ref 65–99)
POTASSIUM: 4.5 mmol/L (ref 3.5–5.0)
SODIUM: 139 mmol/L (ref 135–145)

## 2018-05-08 NOTE — Unmapped (Signed)
Med Inpatient Service  Daily Progress Note     Interval/Overnight/Subjective:     [name]. States that cough and sputum production has improved. Patient had a bowel movement this morning. No changes in plan to continue ABX course (IV Ceftazidime & PO Doxy) as inpatient until 05/12/18. Patient's nurse will be trained on trilogy after which [employer] will deliver DME equipment.     ASSESSMENT/PLAN:    Principal Problem:    Tracheobronchitis  Active Problems:    Asthma    Slow transit constipation    Dysphagia    Esophageal reflux    Hypothyroidism    Seizures (CMS-HCC)    Mood disorder (CMS-HCC)    Congenital tracheomalacia    Tracheostomy dependence (CMS-HCC)    VATER syndrome    Muscle spasms of both lower extremities    Dyspnea     LOS: 5 days     [name] is a 27 year old gentleman w/ VATER syndrome and recurrent bouts of tracheobronchitis presented here on 05/03/18 with ~1 week of cough, dyspnea, and increased sputum production.     Acute on Chronic Tracheobronchitis. 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 He experiences repeated episodes of tracheobronchitis as a result.  Over the past 12 months,??he??has been seen in the ED >10 times, mostly for exacerbations. Presented on 05/03/18 with symptoms c/w another exacerbation. RVP negative. CXR unremarkable. RVP was negative. LRCx have shown OPF and BCx without growth.   - AC: Metanebs, Budesonide, Brovana, Duonebs, 3%HTS  - PO Doxy (5/27-6/5)  - IV Ceftaz (5/27-6/5)  - continue home Azithro MWF  - discontinued steroids   - talk to Jodie (clinic RT) regarding exchange of Trilogy vent and whether DME company delivered cough assist (w/ oscillatory function)  ??  VATER Syndrome. History of tracheobronchomalacia s/p tracheosotomy w/ chronic nocturnal ventilation (Trilogy vent). GI-related manifestations of the syndrome include GERD, esophageal dysmotility, and recurrent esophageal strictures with recent finding of type II achalasia. Follows with Dr. Wilford Corner [facility]).   - continue nightly Trilogy vent  - home meds for tracheobronchomalacia include Symbicort, Duonebs, Brovana  - Gabapentin   - Protonix 20mg  QD  - Zanaflex PRN  - f/u results of timed & regular Barium Swallow studies   - SMOG enema QD PRN for constipation  - Zofran & Phenergan alternated PRN for N/V  ??  Bipolar 1 disorder, PTSD: Has followed [facility] and undergone psychotherapy previously. Continue Celexa, Valproate, and Mirtazapine.  ??  Hypothyroidism: Synthroid.    Acne: home Minocycline 100mg  QHS held while on Doxycycline     Daily checklist:   Diet: regular  VTE ppx: Lovenox   GI ppx: home Protonix   Lytes: replete PRN to keep K>4 and Mag>2  IV Access: PIV  Code status: Full Code  Dispo: Med[facility], intermediate status  _____________________________________________________________________    Objective:     Vital Signs:  Temp:  [36.5 ??C-36.9 ??C] 36.5 ??C  Heart Rate:  [82] 82  Resp:  [17-20] 17  BP: (112-145)/(79-94) 112/94  MAP (mmHg):  [96-98] 98  SpO2:  [95 %-97 %] 95 %    Physical Exam:  GEN: pleasant chronically ill-appearing Caucasian gentleman in NAD  CV: RRR w/ no murmurs heard  LUNGS: normal WOB, tracheostomy in place  ABD: +BS, soft, NT/ND, PEG in place   EXT: no peripheral edema, distal pulses intact   NEURO: grossly unremarkable    Diagnostic Studies:  I reviewed all pertinent diagnostic studies.  ______________

## 2018-05-08 NOTE — Unmapped (Signed)
PICC line has good  blood return.NO TPA needed  Workup / Procedure Time:  30 minutes      The primary RN was notified.       Thank you,     Madelyn Brunner RN Venous Access Team

## 2018-05-08 NOTE — Unmapped (Signed)
CVAD Liaison Consult    CVAD Liaison Consult    CVAD Liaison Nurse was consulted for a PICC line that the primary nurse stated was sluggish to flush and not drawing back blood despite TPA.  Upon my assessment I flushed using the push/pause method and was able to get blood return and flush easily afterwards.  Report given to Jon from VAT to let him know I assessed the PICC line and was able to get the PICC to function appropriately.       Thank you for this consult,  Raymon Mutton RN    Consult Time 15 minutes (min)

## 2018-05-08 NOTE — Unmapped (Addendum)
A/o x4; VSS; q shift VS and assessment; NSR; RA; trach, capped; PICC line became clotted, alteplase given, line declotted by PICC line nurse; no c/o pain; NSR; meds crushed, given through G tube; No other acute changes. Safety precautions maintained. Will continue to monitor.

## 2018-05-08 NOTE — Unmapped (Signed)
FOUND PT ON HOSP TRILOGY VENT. PT REQUESTED TO TRANSITION TO HOME LTV VENT TO SLEEP.Patient was compliant with all inhaled scheduled medications and was under no apparent distress.

## 2018-05-08 NOTE — Unmapped (Signed)
Pt aox4; can be very drowsy while sleeping. VSS; pt on room air during the day and home vent at night. Pt c/o pain; PRN given. Pt has had adequate urine output. Pt has had adequate PO intake. No bm this shift. Pt has not had any visitors, call light and bedside table within reach, free from falls/injuries, q2 rounds performed; will continue to monitor.      Problem: Adult Inpatient Plan of Care  Goal: Plan of Care Review  Outcome: Progressing  Goal: Patient-Specific Goal (Individualization)  Outcome: Progressing  Goal: Absence of Hospital-Acquired Illness or Injury  Outcome: Progressing  Goal: Optimal Comfort and Wellbeing  Outcome: Progressing  Goal: Readiness for Transition of Care  Outcome: Progressing  Goal: Rounds/Family Conference  Outcome: Progressing     Problem: Device-Related Complication Risk (Mechanical Ventilation, Invasive)  Goal: Optimal Device Function  Outcome: Progressing     Problem: Skin and Tissue Injury (Mechanical Ventilation, Invasive)  Goal: Absence of Device-Related Skin and Tissue Injury  Outcome: Progressing     Problem: Venous Thromboembolism  Goal: VTE (Venous Thromboembolism) Symptom Resolution  Outcome: Progressing     Problem: Fall Injury Risk  Goal: Absence of Fall and Fall-Related Injury  Outcome: Progressing

## 2018-05-09 LAB — BASIC METABOLIC PANEL
ANION GAP: 9 mmol/L (ref 9–15)
BLOOD UREA NITROGEN: 5 mg/dL — ABNORMAL LOW (ref 7–21)
BUN / CREAT RATIO: 7
CALCIUM: 9.5 mg/dL (ref 8.5–10.2)
CHLORIDE: 99 mmol/L (ref 98–107)
CO2: 30 mmol/L (ref 22.0–30.0)
CREATININE: 0.68 mg/dL — ABNORMAL LOW (ref 0.70–1.30)
EGFR MDRD AF AMER: >=60 mL/min/{1.73_m2} (ref >=60)
EGFR MDRD NON AF AMER: >=60 mL/min/{1.73_m2} (ref >=60)
GLUCOSE RANDOM: 183 mg/dL — ABNORMAL HIGH (ref 65–179)
POTASSIUM: 4.5 mmol/L (ref 3.5–5.0)

## 2018-05-09 LAB — EGFR MDRD AF AMER: Glomerular filtration rate/1.73 sq M.predicted.black:ArVRat:Pt:Ser/Plas/Bld:Qn:Creatinine-based formula (MDRD): >=60

## 2018-05-09 LAB — CBC W/ AUTO DIFF
BASOPHILS ABSOLUTE COUNT: 0 10*9/L (ref 0.0–0.1)
BASOPHILS RELATIVE PERCENT: 0.4 %
EOSINOPHILS ABSOLUTE COUNT: 0.2 10*9/L (ref 0.0–0.4)
EOSINOPHILS RELATIVE PERCENT: 1.7 %
HEMATOCRIT: 38.2 % — ABNORMAL LOW (ref 41.0–53.0)
HEMOGLOBIN: 12.4 g/dL — ABNORMAL LOW (ref 13.5–17.5)
LARGE UNSTAINED CELLS: 1 % (ref 0–4)
LYMPHOCYTES RELATIVE PERCENT: 17.8 %
MEAN CORPUSCULAR HEMOGLOBIN CONC: 32.4 g/dL (ref 31.0–37.0)
MEAN CORPUSCULAR HEMOGLOBIN: 25.1 pg — ABNORMAL LOW (ref 26.0–34.0)
MEAN CORPUSCULAR VOLUME: 77.6 fL — ABNORMAL LOW (ref 80.0–100.0)
MEAN PLATELET VOLUME: 7.2 fL (ref 7.0–10.0)
MONOCYTES ABSOLUTE COUNT: 0.7 10*9/L (ref 0.2–0.8)
MONOCYTES RELATIVE PERCENT: 7 %
NEUTROPHILS ABSOLUTE COUNT: 7.4 10*9/L (ref 2.0–7.5)
NEUTROPHILS RELATIVE PERCENT: 72.4 %
RED BLOOD CELL COUNT: 4.93 10*12/L (ref 4.50–5.90)
RED CELL DISTRIBUTION WIDTH: 15.2 % — ABNORMAL HIGH (ref 12.0–15.0)

## 2018-05-09 LAB — MAGNESIUM: Magnesium:MCnc:Pt:Ser/Plas:Qn:: 1.6

## 2018-05-09 LAB — LARGE UNSTAINED CELLS: Lab: 1

## 2018-05-09 NOTE — Unmapped (Signed)
Patient was compliant with all inhaled medications this shift as well as airway clearance (metaneb). BS consist of expiratory wheezing. Patient remains on RA. Will continue to monitor.

## 2018-05-09 NOTE — Unmapped (Signed)
[facility] Inpatient Service  Daily Progress Note     Interval/Overnight/Subjective:     [name]. Cameron Baker was resting comfortably this AM. No new complaints. Continuing inpatient course of ABX via IV Ceftazidime and PO Doxy (to be completed on 05/12/18).     We will plan for a lab holiday tomorrow, but will continue getting POCT glucose checks due to ongoing hyperglycemia. He did, however, have a normal HbA1c this admission.     Cameron Baker's nurse will receive Trilogy training after which [employer] will deliver DME equipment.     ASSESSMENT/PLAN:    Principal Problem:    Tracheobronchitis  Active Problems:    Asthma    Slow transit constipation    Dysphagia    Esophageal reflux    Hypothyroidism    Seizures (CMS-HCC)    Mood disorder (CMS-HCC)    Congenital tracheomalacia    Tracheostomy dependence (CMS-HCC)    VATER syndrome    Muscle spasms of both lower extremities    Dyspnea     LOS: 6 days     [name] is a 27 year old gentleman w/ VATER syndrome and recurrent bouts of tracheobronchitis presented here on 05/03/18 with ~1 week of cough, dyspnea, and increased sputum production.     Acute on Chronic Tracheobronchitis. 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 He experiences repeated episodes of tracheobronchitis as a result. Over the past 12 months,??he??has been seen in the ED >10 times, mostly for exacerbations. Presented on 05/03/18 with symptoms c/w another exacerbation. RVP negative. CXR unremarkable. RVP was negative. LRCx have shown OPF and BCx without growth.   - AC: Metanebs, Budesonide, Brovana, Duonebs, 3%HTS  - ABX: PO Doxy (5/27-6/5), IV Ceftaz (5/27-6/5)  - continue home Azithro MWF  - discontinued steroids   - talk to Jodie (clinic RT) regarding exchange of Trilogy vent and whether DME company delivered cough assist (w/ oscillatory function)  ??  VATER Syndrome. History of tracheobronchomalacia s/p tracheosotomy w/ chronic nocturnal ventilation (Trilogy vent). GI-related manifestations of the syndrome include GERD, esophageal dysmotility, and recurrent esophageal strictures with recent finding of type II achalasia (on 04/23/18 esophageal manometry). Follows with Dr. Wilford Corner [facility]).   - continue nightly Trilogy vent  - home meds for tracheobronchomalacia include Symbicort, Duonebs, Brovana  - Gabapentin   - Protonix 20mg  QD  - Zanaflex PRN  - timed & regular Barium Swallow studies (05/05/18): smoothly marginated narrowing of the distal third of the esophagus, w/ delayed clearance of esophageal contents  - SMOG enema QD PRN for constipation  - Zofran & Phenergan alternated PRN for N/V  ??  Bipolar 1 disorder, PTSD: Has followed [facility] and undergone psychotherapy previously. Continue Celexa, Valproate, and Mirtazapine.  ??  Hypothyroidism: Synthroid.    Acne: home Minocycline 100mg  QHS held while on Doxycycline     Daily checklist:   Diet: regular  VTE ppx: Lovenox   GI ppx: home Protonix   Lytes: replete PRN to keep K>4 and Mag>2  IV Access: PIV  Code status: Full Code  Dispo: [facility], intermediate status  _____________________________________________________________________    Objective:     Vital Signs:  Temp:  [36.5 ??C-37.4 ??C] 37.4 ??C  Heart Rate:  [76-95] 76  Resp:  [11-17] 11  BP: (107-138)/(46-94) 107/46  MAP (mmHg):  [65-98] 65  FiO2 (%):  [21 %] 21 %  SpO2:  [95 %-97 %] 95 %    Physical Exam:  GEN: pleasant chronically ill-appearing Caucasian gentleman in NAD  CV:  RRR w/ no murmurs heard  LUNGS: normal WOB, tracheostomy in place  ABD: +BS, soft, NT/ND, PEG in place   EXT: no peripheral edema, distal pulses intact   NEURO: grossly unremarkable    Diagnostic Studies:  I reviewed all pertinent diagnostic studies.  ______________

## 2018-05-09 NOTE — Unmapped (Signed)
Pt aox4; can be very drowsy while sleeping. VSS; pt on room air during the day and home vent at night. Stated that he wasn't feeling very well but couldn't pin point exactly what it was. Pt c/o pain; PRN given. Pt has had adequate urine output. Pt has had adequate PO intake. No bm this shift. Pt has not had any visitors, call light and bedside table within reach, free from falls/injuries, q2 rounds performed; will continue to monitor.      Problem: Adult Inpatient Plan of Care  Goal: Plan of Care Review  Outcome: Progressing  Goal: Patient-Specific Goal (Individualization)  Outcome: Progressing  Goal: Absence of Hospital-Acquired Illness or Injury  Outcome: Progressing  Goal: Optimal Comfort and Wellbeing  Outcome: Progressing  Goal: Readiness for Transition of Care  Outcome: Progressing  Goal: Rounds/Family Conference  Outcome: Progressing     Problem: Device-Related Complication Risk (Mechanical Ventilation, Invasive)  Goal: Optimal Device Function  Outcome: Progressing     Problem: Skin and Tissue Injury (Mechanical Ventilation, Invasive)  Goal: Absence of Device-Related Skin and Tissue Injury  Outcome: Progressing     Problem: Venous Thromboembolism  Goal: VTE (Venous Thromboembolism) Symptom Resolution  Outcome: Progressing     Problem: Fall Injury Risk  Goal: Absence of Fall and Fall-Related Injury  Outcome: Progressing

## 2018-05-09 NOTE — Unmapped (Signed)
A/o x4; VSS; q shift VS and assessment; NSR; RA; trach, capped; c/o pain x1, prn given; c/o nausea; NSR; meds crushed, given through G tube; No other acute changes. Safety precautions maintained. Will continue to monitor.

## 2018-05-10 NOTE — Unmapped (Signed)
Patient was compliant with all inhaled scheduled medications and airway clearance today.     Patient refused overnight vent.

## 2018-05-10 NOTE — Unmapped (Signed)
[facility] Inpatient Service  Daily Progress Note     Interval/Overnight/Subjective:     [name]. [place]le was resting comfortably this AM. No new complaints. Continuing inpatient course of ABX via IV Ceftazidime and PO Doxy (to be completed on 05/12/18). Plan for discharge 05/12/18 afternoon.     ASSESSMENT/PLAN:    Principal Problem:    Tracheobronchitis  Active Problems:    Asthma    Slow transit constipation    Dysphagia    Esophageal reflux    Hypothyroidism    Seizures (CMS-HCC)    Mood disorder (CMS-HCC)    Congenital tracheomalacia    Tracheostomy dependence (CMS-HCC)    VATER syndrome    Muscle spasms of both lower extremities    Dyspnea     LOS: 7 days     Mr. Carry Ortez is a 27 year old gentleman w/ VATER syndrome and recurrent bouts of tracheobronchitis presented here on 05/03/18 with ~1 week of cough, dyspnea, and increased sputum production.     Acute on Chronic Tracheobronchitis. 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 He experiences repeated episodes of tracheobronchitis as a result. Over the past 12 months,??he??has been seen in the ED >10 times, mostly for exacerbations. Presented on 05/03/18 with symptoms c/w another exacerbation. RVP negative. CXR unremarkable. RVP was negative. LRCx have shown OPF and BCx without growth.   - AC: Metanebs, Budesonide, Brovana, Duonebs, 3%HTS  - ABX: PO Doxy (5/27-6/5), IV Ceftaz (5/27-6/5)  - continue home Azithro MWF    Type II Achalasia. Follows with Dr. Wilford Corner [facility]). He underwent timed & regular Barium Swallow studies (05/05/18): smoothly marginated narrowing of the distal third of the esophagus, w/ delayed clearance of esophageal contents. No further inpatient workup requested by Dr. Wilford Corner.   - Protonix 20mg  QD  - ensure f/u with [facility] in outpatient setting     VATER Syndrome. History of tracheobronchomalacia s/p tracheosotomy w/ chronic nocturnal ventilation (Trilogy vent). GI-related manifestations of the syndrome include GERD, esophageal dysmotility, and recurrent esophageal strictures with recent finding of type II achalasia (on 04/23/18 esophageal manometry).   - continue nightly Trilogy vent  - Symbicort, Duonebs, Brovana  - Gabapentin, Zanaflex PRN  - SMOG enema QD PRN for constipation  - Zofran & Phenergan alternated PRN for N/V  ??  Bipolar 1 disorder, PTSD: Has followed [facility] and undergone psychotherapy previously. Continue Celexa, Valproate, and Mirtazapine.  ??  Hypothyroidism: Synthroid.    Acne: home Minocycline 100mg  QHS held while on Doxycycline     Daily checklist:   Diet: regular  VTE ppx: Lovenox   GI ppx: home Protonix   Lytes: replete PRN to keep K>4 and Mag>2  IV Access: PIV  Code status: Full Code  Dispo: [facility], intermediate status  _____________________________________________________________________    Objective:     Vital Signs:  Temp:  [36.6 ??C-36.8 ??C] 36.8 ??C  Heart Rate:  [82-95] 82  Resp:  [11-17] 11  BP: (129-136)/(68-76) 136/68  MAP (mmHg):  [87-93] 87  SpO2:  [97 %-100 %] 100 %    Physical Exam:  GEN: chronically ill-appearing Caucasian gentleman in NAD  CV: RRR w/ no murmurs heard  LUNGS: normal WOB, tracheostomy in place  ABD: +BS, soft, NT/ND, PEG in place   NEURO: grossly unremarkable    Diagnostic Studies:  I reviewed all pertinent diagnostic studies.  ______________

## 2018-05-10 NOTE — Unmapped (Signed)
Pt AOx4, VSS (qshift assessments/VS). PRN oxycodone and phenergan per pt request. IV abx per orders. Pt walking around unit. Turning independently. C/o pain managed through PRN medications. No further complaints or requests. Will continue to monitor and assess.      Problem: Adult Inpatient Plan of Care  Goal: Plan of Care Review  Outcome: Progressing  Goal: Patient-Specific Goal (Individualization)  Outcome: Progressing  Goal: Absence of Hospital-Acquired Illness or Injury  Outcome: Progressing  Goal: Optimal Comfort and Wellbeing  Outcome: Progressing  Goal: Readiness for Transition of Care  Outcome: Progressing  Goal: Rounds/Family Conference  Outcome: Progressing     Problem: Device-Related Complication Risk (Mechanical Ventilation, Invasive)  Goal: Optimal Device Function  Outcome: Progressing     Problem: Skin and Tissue Injury (Mechanical Ventilation, Invasive)  Goal: Absence of Device-Related Skin and Tissue Injury  Outcome: Progressing     Problem: Venous Thromboembolism  Goal: VTE (Venous Thromboembolism) Symptom Resolution  Outcome: Progressing     Problem: Fall Injury Risk  Goal: Absence of Fall and Fall-Related Injury  Outcome: Progressing

## 2018-05-10 NOTE — Unmapped (Signed)
Pt is AAO x 4, stable vitals, triology vent at night, room air during day. No complaints of pain during this shift. Qshift vitals and assessment done. He has an order to go out of unit for an hour. Uses urinal and bathroom, walks around the unit. No acute changes, keep monitoring.  Problem: Adult Inpatient Plan of Care  Goal: Plan of Care Review  05/10/2018 0149 by Rockne Coons, RN  Outcome: Progressing  05/10/2018 0138 by Rockne Coons, RN  Outcome: Progressing  Goal: Patient-Specific Goal (Individualization)  05/10/2018 0149 by Rockne Coons, RN  Outcome: Progressing  05/10/2018 0138 by Rockne Coons, RN  Outcome: Progressing  Goal: Absence of Hospital-Acquired Illness or Injury  05/10/2018 0149 by Rockne Coons, RN  Outcome: Progressing  05/10/2018 0138 by Rockne Coons, RN  Outcome: Progressing  Goal: Optimal Comfort and Wellbeing  05/10/2018 0149 by Rockne Coons, RN  Outcome: Progressing  05/10/2018 0138 by Rockne Coons, RN  Outcome: Progressing  Goal: Readiness for Transition of Care  05/10/2018 0149 by Rockne Coons, RN  Outcome: Progressing  05/10/2018 0138 by Rockne Coons, RN  Outcome: Progressing  Goal: Rounds/Family Conference  05/10/2018 0149 by Rockne Coons, RN  Outcome: Progressing  05/10/2018 0138 by Rockne Coons, RN  Outcome: Progressing     Problem: Device-Related Complication Risk (Mechanical Ventilation, Invasive)  Goal: Optimal Device Function  05/10/2018 0149 by Rockne Coons, RN  Outcome: Progressing  05/10/2018 0138 by Rockne Coons, RN  Outcome: Progressing     Problem: Skin and Tissue Injury (Mechanical Ventilation, Invasive)  Goal: Absence of Device-Related Skin and Tissue Injury  05/10/2018 0149 by Rockne Coons, RN  Outcome: Progressing  05/10/2018 0138 by Rockne Coons, RN  Outcome: Progressing     Problem: Venous Thromboembolism  Goal: VTE (Venous Thromboembolism) Symptom Resolution  05/10/2018 0149 by Rockne Coons, RN  Outcome: Progressing  05/10/2018 0138 by Rockne Coons, RN  Outcome: Progressing     Problem: Fall Injury Risk  Goal: Absence of Fall and Fall-Related Injury  05/10/2018 0149 by Rockne Coons, RN  Outcome: Progressing  05/10/2018 0138 by Rockne Coons, RN  Outcome: Progressing

## 2018-05-11 LAB — CBC W/ AUTO DIFF
BASOPHILS ABSOLUTE COUNT: 0 10*9/L (ref 0.0–0.1)
BASOPHILS RELATIVE PERCENT: 0.4 %
EOSINOPHILS ABSOLUTE COUNT: 0.1 10*9/L (ref 0.0–0.4)
EOSINOPHILS RELATIVE PERCENT: 1.8 %
HEMATOCRIT: 37.4 % — ABNORMAL LOW (ref 41.0–53.0)
HEMOGLOBIN: 12.3 g/dL — ABNORMAL LOW (ref 13.5–17.5)
LYMPHOCYTES ABSOLUTE COUNT: 2.1 10*9/L (ref 1.5–5.0)
LYMPHOCYTES RELATIVE PERCENT: 28.6 %
MEAN CORPUSCULAR HEMOGLOBIN CONC: 32.7 g/dL (ref 31.0–37.0)
MEAN CORPUSCULAR HEMOGLOBIN: 25.3 pg — ABNORMAL LOW (ref 26.0–34.0)
MEAN CORPUSCULAR VOLUME: 77.1 fL — ABNORMAL LOW (ref 80.0–100.0)
MEAN PLATELET VOLUME: 7.2 fL (ref 7.0–10.0)
MONOCYTES ABSOLUTE COUNT: 0.6 10*9/L (ref 0.2–0.8)
MONOCYTES RELATIVE PERCENT: 8.2 %
NEUTROPHILS ABSOLUTE COUNT: 4.4 10*9/L (ref 2.0–7.5)
NEUTROPHILS RELATIVE PERCENT: 58.5 %
PLATELET COUNT: 371 10*9/L (ref 150–440)
RED BLOOD CELL COUNT: 4.85 10*12/L (ref 4.50–5.90)
RED CELL DISTRIBUTION WIDTH: 15 % (ref 12.0–15.0)
WBC ADJUSTED: 7.5 10*9/L (ref 4.5–11.0)

## 2018-05-11 LAB — BASIC METABOLIC PANEL
ANION GAP: 9 mmol/L (ref 9–15)
BLOOD UREA NITROGEN: 11 mg/dL (ref 7–21)
BUN / CREAT RATIO: 17
CHLORIDE: 99 mmol/L (ref 98–107)
CO2: 29 mmol/L (ref 22.0–30.0)
CREATININE: 0.65 mg/dL — ABNORMAL LOW (ref 0.70–1.30)
EGFR MDRD AF AMER: >=60 mL/min/{1.73_m2} (ref >=60)
EGFR MDRD NON AF AMER: >=60 mL/min/{1.73_m2} (ref >=60)
GLUCOSE RANDOM: 276 mg/dL — ABNORMAL HIGH (ref 65–179)
POTASSIUM: 4.4 mmol/L (ref 3.5–5.0)
SODIUM: 137 mmol/L (ref 135–145)

## 2018-05-11 LAB — MAGNESIUM
MAGNESIUM: 1.7 mg/dL (ref 1.6–2.2)
Magnesium:MCnc:Pt:Ser/Plas:Qn:: 1.7

## 2018-05-11 LAB — MEAN PLATELET VOLUME: Lab: 7.2

## 2018-05-11 LAB — BUN / CREAT RATIO: Urea nitrogen/Creatinine:MRto:Pt:Ser/Plas:Qn:: 17

## 2018-05-11 NOTE — Unmapped (Signed)
Pt is AAO x 4, Currently on room air, vent at night, denies any pain during this shift, complaints nausea, controlled with phenergan. Stable vitals. No acute changes have been noted during this shift. Fall precaution maintained, Lovenox order in place for VRE prophylaxis. Keep monitoring  Problem: Adult Inpatient Plan of Care  Goal: Plan of Care Review  Outcome: Progressing  Goal: Patient-Specific Goal (Individualization)  Outcome: Progressing  Goal: Absence of Hospital-Acquired Illness or Injury  Outcome: Progressing  Goal: Optimal Comfort and Wellbeing  Outcome: Progressing  Goal: Readiness for Transition of Care  Outcome: Progressing  Goal: Rounds/Family Conference  Outcome: Progressing     Problem: Device-Related Complication Risk (Mechanical Ventilation, Invasive)  Goal: Optimal Device Function  Outcome: Progressing     Problem: Skin and Tissue Injury (Mechanical Ventilation, Invasive)  Goal: Absence of Device-Related Skin and Tissue Injury  Outcome: Progressing     Problem: Venous Thromboembolism  Goal: VTE (Venous Thromboembolism) Symptom Resolution  Outcome: Progressing     Problem: Fall Injury Risk  Goal: Absence of Fall and Fall-Related Injury  Outcome: Progressing

## 2018-05-11 NOTE — Unmapped (Signed)
[facility] Inpatient Service  Daily Progress Note     Interval/Overnight/Subjective:     [name]. Resting comfortably and with no new complaints. Continuing inpatient IV Ceftazidime and PO Doxy to be completed 6/5 at 2pm with discharge to follow.    ASSESSMENT/PLAN:    Principal Problem:    Tracheobronchitis  Active Problems:    Asthma    Slow transit constipation    Dysphagia    Esophageal reflux    Hypothyroidism    Seizures (CMS-HCC)    Mood disorder (CMS-HCC)    Congenital tracheomalacia    Tracheostomy dependence (CMS-HCC)    VATER syndrome    Muscle spasms of both lower extremities    Dyspnea     LOS: 8 days     [name] is a 27 year old gentleman w/ VATER syndrome and recurrent bouts of tracheobronchitis presented here on 05/03/18 with ~1 week of cough, dyspnea, and increased sputum production.     Acute on Chronic Tracheobronchitis. 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 He experiences repeated episodes of tracheobronchitis as a result. Over the past 12 months,??he??has been seen in the ED >10 times, mostly for exacerbations. Presented on 05/03/18 with symptoms c/w another exacerbation. RVP negative. CXR unremarkable. RVP was negative. LRCx have shown OPF and BCx without growth.   - AC: Metanebs, Budesonide, Brovana, Duonebs, 3%HTS  - ABX: PO Doxy (5/27-6/5), IV Ceftaz (5/27-6/5)  - continue home Azithro MWF    Type II Achalasia. Follows with Dr. Wilford Corner [facility]). He underwent timed & regular Barium Swallow studies (05/05/18): smoothly marginated narrowing of the distal third of the esophagus, w/ delayed clearance of esophageal contents. No further inpatient workup requested by Dr. Wilford Corner.   - Protonix 20mg  QD  - Ensure f/u with [facility] in outpatient setting     VATER Syndrome. History of tracheobronchomalacia s/p tracheosotomy w/ chronic nocturnal ventilation (Trilogy vent). GI-related manifestations of the syndrome include GERD, esophageal dysmotility, and recurrent esophageal strictures with recent finding of type II achalasia (on 04/23/18 esophageal manometry).   - continue nightly Trilogy vent  - Symbicort, Duonebs, Brovana  - Gabapentin, Zanaflex PRN  - SMOG enema QD PRN for constipation  - Zofran & Phenergan alternated PRN for N/V  ??  Bipolar 1 disorder, PTSD: Has followed [facility] and undergone psychotherapy previously. Continue Celexa, Valproate, and Mirtazapine.  ??  Hypothyroidism: Synthroid.    Acne: home Minocycline 100mg  QHS held while on Doxycycline     Daily checklist:   Diet: regular  VTE ppx: Lovenox   GI ppx: home Protonix   Lytes: replete PRN to keep K>4 and Mag>2  IV Access: PIV  Code status: Full Code  Dispo: [facility], intermediate status  _____________________________________________________________________    Objective:     Vital Signs:  Temp:  [37.1 ??C] 37.1 ??C  Heart Rate:  [65-89] 65  Resp:  [10-20] 10  BP: (107)/(37) 107/37  MAP (mmHg):  [59] 59  FiO2 (%):  [21 %] 21 %  SpO2:  [97 %-98 %] 98 %    Physical Exam:  GEN: chronically ill-appearing Caucasian gentleman in NAD, pleasant this morning  CV: RRR w/ no murmurs heard  LUNGS: normal WOB, tracheostomy in place  ABD: +BS, soft, NT/ND, PEG in place   NEURO: grossly unremarkable    Diagnostic Studies:  I reviewed all pertinent diagnostic studies.  ______________    Larita Fife Allyne Gee, MD  Internal Medicine PGY-1  05/11/18 8:27 AM

## 2018-05-11 NOTE — Unmapped (Signed)
Pt AOx4, VSS (qshift assessments/VS). PRN oxycodone and phenergan per pt request. Gave PRN enema, BM x1. IV abx per orders. Pt walking around unit. Turning independently. No further complaints or requests. Will continue to monitor and assess.      Problem: Adult Inpatient Plan of Care  Goal: Plan of Care Review  05/11/2018 1235 by Karl Luke, RN  Outcome: Progressing  05/11/2018 1235 by Karl Luke, RN  Outcome: Progressing  Goal: Patient-Specific Goal (Individualization)  05/11/2018 1235 by Karl Luke, RN  Outcome: Progressing  05/11/2018 1235 by Karl Luke, RN  Outcome: Progressing  Goal: Absence of Hospital-Acquired Illness or Injury  05/11/2018 1235 by Karl Luke, RN  Outcome: Progressing  05/11/2018 1235 by Karl Luke, RN  Outcome: Progressing  Goal: Optimal Comfort and Wellbeing  05/11/2018 1235 by Karl Luke, RN  Outcome: Progressing  05/11/2018 1235 by Karl Luke, RN  Outcome: Progressing  Goal: Readiness for Transition of Care  05/11/2018 1235 by Karl Luke, RN  Outcome: Progressing  05/11/2018 1235 by Karl Luke, RN  Outcome: Progressing  Goal: Rounds/Family Conference  05/11/2018 1235 by Karl Luke, RN  Outcome: Progressing  05/11/2018 1235 by Karl Luke, RN  Outcome: Progressing     Problem: Device-Related Complication Risk (Mechanical Ventilation, Invasive)  Goal: Optimal Device Function  05/11/2018 1235 by Karl Luke, RN  Outcome: Progressing  05/11/2018 1235 by Karl Luke, RN  Outcome: Progressing     Problem: Skin and Tissue Injury (Mechanical Ventilation, Invasive)  Goal: Absence of Device-Related Skin and Tissue Injury  05/11/2018 1235 by Karl Luke, RN  Outcome: Progressing  05/11/2018 1235 by Karl Luke, RN  Outcome: Progressing     Problem: Venous Thromboembolism  Goal: VTE (Venous Thromboembolism) Symptom Resolution  05/11/2018 1235 by Karl Luke, RN  Outcome: Progressing  05/11/2018 1235 by Karl Luke, RN  Outcome: Progressing     Problem: Fall Injury Risk  Goal: Absence of Fall and Fall-Related Injury  05/11/2018 1235 by Karl Luke, RN  Outcome: Progressing  05/11/2018 1235 by Karl Luke, RN  Outcome: Progressing

## 2018-05-12 MED ORDER — SODIUM CHLORIDE 3 % FOR NEBULIZATION
Freq: Four times a day (QID) | RESPIRATORY_TRACT | 1 refills | 0 days | Status: CP
Start: 2018-05-12 — End: 2019-05-12

## 2018-05-12 MED ORDER — ALBUTEROL SULFATE 2.5 MG/3 ML (0.083 %) SOLUTION FOR NEBULIZATION
Freq: Four times a day (QID) | RESPIRATORY_TRACT | 1 refills | 0 days | Status: CP
Start: 2018-05-12 — End: 2018-05-20

## 2018-05-12 MED ORDER — IPRATROPIUM BROMIDE 0.02 % SOLUTION FOR INHALATION
Freq: Four times a day (QID) | RESPIRATORY_TRACT | 1 refills | 0.00000 days | Status: CP
Start: 2018-05-12 — End: 2018-05-20

## 2018-05-12 MED ORDER — POLYETHYLENE GLYCOL 3350 17 GRAM ORAL POWDER PACKET
PACK | Freq: Every day | GASTROSTOMY | 0 refills | 0 days | Status: SS | PRN
Start: 2018-05-12 — End: 2018-08-20

## 2018-05-12 MED ORDER — PROMETHAZINE 6.25 MG/5 ML ORAL SYRUP
Freq: Four times a day (QID) | ORAL | 0 refills | 0.00000 days | Status: CP | PRN
Start: 2018-05-12 — End: 2018-05-20

## 2018-05-12 NOTE — Unmapped (Signed)
PICC LINE REMOVAL PROCEDURE NOTE    PICC line was removed per protocol.  Hemostasis was achieved.  Patient tolerated procedure well.  Gauze and transparent dressing placed.  Verbal and written site care instructions were provided.  Patient verbalized understanding.      PICC tip intact.  Measurement upon removal corresponds with insertion record    PICC tip was not sent for culture.      Thank You,    Laurann Montana RN Venous Access Team [phone]     Workup Time:  15 minutes

## 2018-05-12 NOTE — Unmapped (Signed)
I contacted [name] at [employer] this afternoon to verify training and delivery of trilogy.  She said that Isak's nurse did not attend training on Monday as planned.  The event was not re-scheduled.  I attempted to contact French Ana the home care nurse and I left a message for her to call me back.   [name] at [employer] said that they would work on having the cough assist delivered before Hart's discharge.

## 2018-05-12 NOTE — Unmapped (Addendum)
Pt AxO x4. (Qshift assessment and vital signs) VSS. No complaints of pain. PRN Phenergan given x2. IV abx as ordered. Pt ambulates and turns independently. Safety precautions maintained. Will CTM.    Problem: Adult Inpatient Plan of Care  Goal: Plan of Care Review  Outcome: Progressing  Goal: Patient-Specific Goal (Individualization)  Outcome: Progressing  Goal: Absence of Hospital-Acquired Illness or Injury  Outcome: Progressing  Goal: Optimal Comfort and Wellbeing  Outcome: Progressing  Goal: Readiness for Transition of Care  Outcome: Progressing  Goal: Rounds/Family Conference  Outcome: Progressing     Problem: Device-Related Complication Risk (Mechanical Ventilation, Invasive)  Goal: Optimal Device Function  Outcome: Progressing     Problem: Skin and Tissue Injury (Mechanical Ventilation, Invasive)  Goal: Absence of Device-Related Skin and Tissue Injury  Outcome: Progressing     Problem: Venous Thromboembolism  Goal: VTE (Venous Thromboembolism) Symptom Resolution  Outcome: Progressing     Problem: Fall Injury Risk  Goal: Absence of Fall and Fall-Related Injury  Outcome: Progressing

## 2018-05-12 NOTE — Unmapped (Signed)
Pt AOx4, VSS (qshift assessments/VS). PRN oxycodone and phenergan per pt request. IV abx per orders. Pt walking around unit, went off unit for 1 hour per ok order. Turning independently. Last abx administered. PICC removed by PICC team. Reviewed discharge information with patient including medications and follow up instructions. Pt left with all belongings. Refused hospital transport (ambulatory and steady on feet) and denies need for assistance with ride. No further needs identified, pt denies any further needs.

## 2018-05-12 NOTE — Unmapped (Signed)
Physician Discharge Summary    Identifying Information:   Cameron Baker  1991-11-18  [id number]    Admit date: 05/03/2018    Discharge date: 05/12/2018     Discharge Service: Pulmonology (MDG)    Discharge Attending Physician: Truett Mainland, MD    Discharge to: Home with Home Health and/or PT/OT    Discharge Diagnoses:  Principal Problem:    Tracheobronchitis  Active Problems:    Asthma    Slow transit constipation    Dysphagia    Esophageal reflux    Hypothyroidism    Seizures (CMS-HCC)    Mood disorder (CMS-HCC)    Congenital tracheomalacia    Tracheostomy dependence (CMS-HCC)    VATER syndrome    Muscle spasms of both lower extremities    Dyspnea  Resolved Problems:    * No resolved hospital problems. *    Post Discharge Follow Up Issues:   [ ]  f/u Pulmonology clinic appt  [ ]  f/u GI clinic appt (for further mgmt of type 2 achalasia)   [ ]  talk to??Jodie (clinic RT)??regarding exchange of Trilogy??vent??and whether DME company??delivered??cough assist (w/ oscillatory function)  [ ]  referred to interventional radiology for consideration of port placement    Hospital Course:     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   ??  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 He experiences repeated episodes of tracheobronchitis as a result. Over the past 12 months,??he??has been seen in the??ED >10 times, mostly for exacerbations.??Presented on 05/03/18 with symptoms c/w another exacerbation. RVP negative. CXR unremarkable. RVP was negative. LRCx have shown OPF and BCx without growth. Steroids??were initially started on admission, but discontinued soon after since little-to-improvement on steroid course he took week prior to admission. He continued on his home Azithro??MWF.   AC: Metanebs, Budesonide, Brovana, Duonebs, 3%HTS  ABX: PO Doxy (5/27-6/5), IV Ceftaz (5/27-6/5)    VATER Syndrome.??History of tracheobronchomalacia??s/p tracheosotomy??w/ chronic nocturnal ventilation??(Trilogy vent).??GI-related manifestations of the syndrome include GERD, esophageal dysmotility, and recurrent esophageal strictures with recent finding of type II achalasia. Follows with Dr. Wilford Corner [facility]). He continued nightly Trilogy vent as well as home meds for tracheobronchomalacia (Symbicort, Duonebs, Brovana). Gabapentin, Protonix 20mg  QD, and Zanaflex PRN  SMOG enema QD PRN for constipation  and Zofran & Phenergan alternated PRN for N/V.  ??  Bipolar 1??disorder, PTSD:??Has followed [facility] and undergone psychotherapy previously.??Continue Celexa, Valproate, and Mirtazapine.  ??  Hypothyroidism: Synthroid.  ??  Acne: home Minocycline 100mg  QHS held while on Doxycycline   ??  Procedures:  None  ______________________________________________________________________    Discharge Day Services:  BP 136/82  - Pulse 76  - Temp 36.8 ??C (Oral)  - Resp 12  - Ht 167.6 cm (5' 5.98)  - Wt 98.2 kg (216 lb 7.9 oz)  - SpO2 100%  - BMI 34.96 kg/m??   Pt seen on the day of discharge and determined appropriate for discharge.    Condition at Discharge: good      ______________________________________________________________________  Discharge Medications:     Your Medication List      STOP taking these medications    DUONEB INHL     meloxicam 7.5 MG tablet  Commonly known as:  MOBIC     ondansetron 8 MG disintegrating tablet  Commonly known as:  ZOFRAN-ODT     promethazine 6.25 mg/5  mL syrup  Commonly known as:  PHENERGAN     sodium chloride 7% 7 % Nebu  Replaced by:  sodium chloride 3 % nebulizer solution        START taking these medications    ipratropium 0.02 % nebulizer solution  Commonly known as:  ATROVENT  Inhale 2.5 mL (500 mcg total) by nebulization 4 (four) times a day.     polyethylene glycol 17 gram packet  Commonly known as:  MIRALAX  17 g by G-tube route daily as needed.     sodium chloride 3 % nebulizer solution  Inhale 4 mL by nebulization 4 (four) times a day.  Replaces:  sodium chloride 7% 7 % Nebu        CHANGE how you take these medications    albuterol 90 mcg/actuation inhaler  Commonly known as:  PROAIR HFA  Inhale 2 puffs every six (6) hours as needed for wheezing.  What changed:  Another medication with the same name was changed. Make sure you understand how and when to take each.     albuterol 2.5 mg /3 mL (0.083 %) nebulizer solution  Inhale 3 mL (2.5 mg total) by nebulization 4 (four) times a day. Prior to HyperTonic Saline  What changed:    ?? how much to take  ?? when to take this  ?? reasons to take this  ?? additional instructions     tiZANidine 4 MG tablet  Commonly known as:  ZANAFLEX  1 tablet (4 mg total) by G-tube route Three (3) times a day as needed.  What changed:  when to take this        CONTINUE taking these medications    acetaminophen 160 mg/5 mL (5 mL) suspension  Commonly known as:  TYLENOL  15.6 mL (500 mg total) by G-tube route every four (4) hours as needed for pain.     arformoterol  Commonly known as:  BROVANA  Inhale 2 mL (15 mcg total) by nebulization Two (2) times a day.     azithromycin 500 MG tablet  Commonly known as:  ZITHROMAX  1 tablet (500 mg total) by G-tube route 3 (three) times a week.     budesonide 0.5 mg/2 mL nebulizer solution  Commonly known as:  PULMICORT  Inhale 4 mL (1 mg total) by nebulization Two (2) times a day.     budesonide-formoterol 160-4.5 mcg/actuation inhaler  Commonly known as:  SYMBICORT  Inhale 2 puffs Two (2) times a day.     cetirizine 10 MG tablet  Commonly known as:  ZyrTEC  1 tablet (10 mg total) by G-tube route every evening.     cholecalciferol (vitamin D3) 2,000 unit Cap  Commonly known as:  VITAMIN D3  3 capsules (6,000 Units total) by G-tube route daily.     citalopram 40 MG tablet  Commonly known as:  CeleXA  1 tablet (40 mg total) by G-tube route nightly.     clindamycin 1 % gel  Commonly known as:  CLINDAGEL  Apply topically Two (2) times a day.     diclofenac sodium 1 % gel  Commonly known as:  VOLTAREN  Apply 2 g topically Four (4) times a day.     dicyclomine 10 mg capsule  Commonly known as:  BENTYL  10 mg by G-tube route 4 (four) times a day as needed.     fluticasone propionate 50 mcg/actuation nasal spray  Commonly known as:  FLONASE  2 sprays by Each Nare  route daily.     gabapentin 300 MG capsule  Commonly known as:  NEURONTIN  300 mg by G-tube route Three (3) times a day.     ibuprofen 600 MG tablet  Commonly known as:  ADVIL,MOTRIN  1 tablet (600 mg total) by G-tube route every six (6) hours as needed for pain.     levothyroxine 75 MCG tablet  Commonly known as:  SYNTHROID, LEVOTHROID  1 tablet (75 mcg total) by G-tube route daily.     linaclotide 290 mcg capsule  Commonly known as:  LINZESS  1 capsule (290 mcg total) by G-tube route daily.     LORazepam 0.5 MG tablet  Commonly known as:  ATIVAN  Take 1 tablet (0.5mg ) twice daily and 1 tablet (0.5mg ) daily as needed for anxiety via G-tube     minocycline 100 MG capsule  Commonly known as:  MINOCIN,DYNACIN  1 capsule (100 mg total) by G-tube route nightly.     mirtazapine 15 MG tablet  Commonly known as:  REMERON  1 tablet (15 mg total) by G-tube route nightly.     montelukast 10 mg tablet  Commonly known as:  SINGULAIR  Take 1 tablet (10 mg total) by mouth nightly.     omeprazole 20 MG capsule  Commonly known as:  PriLOSEC  20 mg Two (2) times a day (30 minutes before a meal). Via G-tube     oxyCODONE 5 MG immediate release tablet  Commonly known as:  ROXICODONE  5 mg by G-tube route every four (4) hours as needed for pain.     PARI LC D NEBULIZER Misc  Generic drug:  nebulizers  Provide 1 device  for use with inhaled medication.     tobramycin (PF) 300 mg/5 mL nebulizer solution  Commonly known as:  TOBI  Inhale 5 mL (300 mg total) by nebulization every twelve (12) hours. Every other month     triamcinolone 0.1 % cream  Commonly known as:  KENALOG  Apply topically Two (2) times a day.     valproate 250 mg/5 mL syrup  Commonly known as:  DEPAKENE  TAKE BY G-TUBE EVERY MORNING AND EVERY EVENING          ______________________________________________________________________  Pending Test Results (if blank, then none):      Most Recent Labs:  Microbiology Results (last day)     ** No results found for the last 24 hours. **          Lab Results   Component Value Date    WBC 7.5 05/11/2018    HGB 12.3 (L) 05/11/2018    HCT 37.4 (L) 05/11/2018    PLT 371 05/11/2018       Lab Results   Component Value Date    NA 137 05/11/2018    K 4.4 05/11/2018    CL 99 05/11/2018    CO2 29.0 05/11/2018    BUN 11 05/11/2018    CREATININE 0.65 (L) 05/11/2018    CALCIUM 9.9 05/11/2018    MG 1.7 05/11/2018    PHOS 4.1 03/29/2018       Lab Results   Component Value Date    ALKPHOS 54 05/03/2018    BILITOT 0.7 05/03/2018    BILIDIR 0.70 (H) 03/18/2017    PROT 8.1 05/03/2018    ALBUMIN 5.0 05/03/2018    ALT 48 05/03/2018    AST 46 05/03/2018       Lab Results   Component Value Date  PT 11.1 05/03/2018    INR 0.97 05/03/2018     Hospital Radiology:  Xr Chest 2 Views    Result Date: 05/03/2018  EXAM: XR CHEST 2 VIEWS DATE: 05/03/2018 1:32 AM ACCESSION: [id number] UN DICTATED: 05/03/2018 1:53 AM INTERPRETATION LOCATION: [facility] CLINICAL INDICATION: 27 years old Male with SHORTNESS OF BREATH--  COMPARISON: CXR 04/29/2018 and earlier. TECHNIQUE: PA and Lateral Chest Radiographs. FINDINGS: Tracheostomy cannula projects over the tracheal air column, unchanged. No focal consolidation or pulmonary edema. No pneumothorax or sizable pleural effusion. Stable cardiac mediastinal silhouette. No acute osseous abnormality. Left upper quadrant surgical clips.     No acute airspace disease.    Fl Barium Swallow    Result Date: 05/05/2018  EXAM: FL BARIUM SWALLOW DATE: 05/05/2018 9:35 AM ACCESSION: [id number] UN DICTATED: 05/05/2018 9:53 AM INTERPRETATION LOCATION: [facility] CLINICAL INDICATION: 27 years old Male with type II achalasia and distal esophageal stricture. The patient has had multiple failed fundoplications. TECHNIQUE: Single contrast barium swallow performed. The patient was given oral contrast to swallow and observed fluoroscopically.  Videofluoroscopy and spot films of the thoracic and cervical esophagus were obtained. Fluoroscopy time was 1.58 minutes using a low dose system with pulsed fluoroscopy at 15 frames per second. COMPARISON: Timed barium swallow dated May 05, 2018. FINDINGS: The patient was given oral contrast to swallow which the patient did without difficulty. A tracheostomy device is seen. There is narrowing of the distal third of the esophagus. The esophageal mucosa was otherwise normal in appearance, with no mucosal lesions, ulcerations, or polypoid filling defects.  There is esophageal dysmotility with delayed clearance of esophageal contents. There was no evidence of a hiatal hernia, esophageal ring, web or achalasia. Due to delayed clearance of esophageal contents and residual barium within the esophagus, this patient cannot be evaluated for gastroesophageal reflux. The patient was not given a 12.5 mm barium tablet demonstrated delayed clearance of esophageal contents with distal esophageal stricture.. Evaluation of the cervical esophagus with videofluoroscopy and rapid filming was unremarkable. The remainder of the partially included chest, including the cardiomediastinal silhouette, lungs, bones and soft tissues were normal.     1.Smoothly marginated narrowing of the distal third of the esophagus. 2.Delayed clearance of esophageal contents.     Xr Timed Barium Esophogram    Result Date: 05/05/2018  EXAM: XR TIMED BARIUM ESOPHAGRAM DATE: 05/05/2018 9:13 AM ACCESSION: [id number] UN DICTATED: 05/05/2018 10:17 AM INTERPRETATION LOCATION: [facility] CLINICAL INDICATION: 27 years old Male with type II achalasia.  TECHNIQUE: Single contrast barium swallow performed. The patient was given oral contrast to swallow and radiograph were obtained at 1, 2, and 5 minutes after contrast ingestion. Fluoroscopy was not utilized for this study. COMPARISON: Barium swallow dated May 05, 2018. FINDINGS: A tracheostomy device is seen. Surgical clips are seen in the left upper quadrant which may be related to patient's prior fundoplication surgeries. No acute osseous bodies are seen. There is no significant change in the amount of contrast visualized in the esophagus on the 1, 2, and 5 minute images. There is relative narrowing of the distal esophagus, better visualized on the comparison barium swallow.          1.Delayed clearance of esophageal contents with no significant change in barium quantity on the 1, 2, and 5 minute images. 2.Relative narrowing of the distal esophagus, better visualized on the comparison barium swallow study.      ______________________________________________________________________    Discharge Instructions:           Follow Up instructions  and Outpatient Referrals     Ambulatory referral to Gastroenterology      Type II Achalasia, VATER syndrome         Ambulatory referral to Home Health      Disciplines requested:  Nursing    Nursing requested:  Other: (please enter in comments) Comment - Resume prior care    Physician to follow patient's care:  PCP    Requested start of care date:  Day of Discharge    Do you want ongoing co-management?:  No    Care coordination required?:  No    I certify that Cameron Baker is confined to his/her home and needs intermittent skilled nursing care, physical therapy and/or speech therapy or continues to need occupational therapy. The patient is under my care, and I have authorized services on this plan of care and will periodically review the plan. The patient had a face-to-face encounter with an allowed provider type on (date) 05/12/18 and the encounter was related to the primary reason for home health care.     Please resume private duty nursing per previous schedule         Ambulatory referral to Interventional Radiology      Port consideration         Discharge instructions      Cameron Baker, it was a pleasure taking care of you!    You are to resume your home medications as previously prescribed with just a few small changes:    - Take DuoNebs scheduled 4 times daily until directed otherwise    - 3% hypertonic saline 4 times daily    - Take miralax EVERY day with a glass of water to help keep your bowels regular    Follow up with Dr. Garner Nash in about 1-2 weeks - expect a phone call about this    Follow up with GI (gastroenterology) for achalasia - also expect a phone call about this         ORDER TO SCHEDULE FOLLOW-UP VISIT W/SPECIALIST PHYSICIAN (SPECIFY)      Dr. Garner Nash in 1-2 weeks               Appointments which have been scheduled for you    May 19, 2018 11:05 AM EDT  (Arrive by 10:45 AM)  HOSPITAL FOLLOW UP RET FM with Scot Dock, MD  [facility] FAMILY MEDIC[facility] [place] REGION) [address]  [place] [address]  [phone]      Jun 29, 2018 12:00 PM EDT  (Arrive by 11:45 AM)  RETURN PFT 15 with [place] PFT 4  [facility] PULMONARY SPECIALTY FUNCT [place] [facility] REG[facility]) [address]  [address]  [place] [address]  [phone]      Jun 29, 2018 12:30 PM EDT  (Arrive by 12:15 PM)  RETURN BRONCHIECTSIS with Truett Mainland, MD  [facility] PULMONARY SPECIALTY CL [place] [facility] REGION) [address]  [address]  [place] [address]  [phone]             Length of Discharge: I spent greater than 30 mins in the discharge of this patient.    Larita Fife. Allyne Gee, MD  Internal Medicine PGY-1  05/12/18 11:37 AM

## 2018-05-12 NOTE — Unmapped (Signed)
Patient has a #6 bivona cuffless trach. Trach care was done without complications. He is on room air during the day, trilogy vent at night. Patient suctions himself. He received all inhaled respiratory medications with the metaneb this shift. Will continue to monitor.

## 2018-05-13 NOTE — Unmapped (Signed)
Notes from last clinic visit faxed to [facility] per their request for the cough assist.  I called [employer] to confirm that they received the fax.  Cameron Baker states that they should be able to contact Cameron Baker today to set up delivery of the cough assist.

## 2018-05-19 NOTE — Unmapped (Signed)
[place] [facility] request signed and sent to [employer].

## 2018-05-20 DIAGNOSIS — R0602 Shortness of breath: Principal | ICD-10-CM

## 2018-05-20 LAB — CBC W/ AUTO DIFF
BASOPHILS ABSOLUTE COUNT: 0.1 10*9/L (ref 0.0–0.1)
BASOPHILS RELATIVE PERCENT: 0.8 %
EOSINOPHILS ABSOLUTE COUNT: 0.1 10*9/L (ref 0.0–0.4)
EOSINOPHILS RELATIVE PERCENT: 1 %
HEMATOCRIT: 40.9 % — ABNORMAL LOW (ref 41.0–53.0)
HEMOGLOBIN: 13.5 g/dL (ref 13.5–17.5)
LYMPHOCYTES ABSOLUTE COUNT: 3.3 10*9/L (ref 1.5–5.0)
LYMPHOCYTES RELATIVE PERCENT: 28.9 %
MEAN CORPUSCULAR HEMOGLOBIN CONC: 33.1 g/dL (ref 31.0–37.0)
MEAN CORPUSCULAR HEMOGLOBIN: 24.7 pg — ABNORMAL LOW (ref 26.0–34.0)
MEAN CORPUSCULAR VOLUME: 74.7 fL — ABNORMAL LOW (ref 80.0–100.0)
MEAN PLATELET VOLUME: 6.8 fL — ABNORMAL LOW (ref 7.0–10.0)
MONOCYTES ABSOLUTE COUNT: 0.9 10*9/L — ABNORMAL HIGH (ref 0.2–0.8)
MONOCYTES RELATIVE PERCENT: 7.6 %
NEUTROPHILS ABSOLUTE COUNT: 6.8 10*9/L (ref 2.0–7.5)
NEUTROPHILS RELATIVE PERCENT: 59.3 %
PLATELET COUNT: 615 10*9/L — ABNORMAL HIGH (ref 150–440)
RED BLOOD CELL COUNT: 5.47 10*12/L (ref 4.50–5.90)
WBC ADJUSTED: 11.5 10*9/L — ABNORMAL HIGH (ref 4.5–11.0)

## 2018-05-20 LAB — COMPREHENSIVE METABOLIC PANEL
ALBUMIN: 4.9 g/dL (ref 3.5–5.0)
ALKALINE PHOSPHATASE: 51 U/L (ref 38–126)
ALT (SGPT): 36 U/L (ref 19–72)
AST (SGOT): 35 U/L (ref 19–55)
BILIRUBIN TOTAL: 0.4 mg/dL (ref 0.0–1.2)
BLOOD UREA NITROGEN: 8 mg/dL (ref 7–21)
BUN / CREAT RATIO: 11
CALCIUM: 9.6 mg/dL (ref 8.5–10.2)
CHLORIDE: 103 mmol/L (ref 98–107)
CO2: 21 mmol/L — ABNORMAL LOW (ref 22.0–30.0)
CREATININE: 0.74 mg/dL (ref 0.70–1.30)
EGFR MDRD AF AMER: >=60 mL/min/{1.73_m2} (ref >=60)
EGFR MDRD NON AF AMER: >=60 mL/min/{1.73_m2} (ref >=60)
GLUCOSE RANDOM: 108 mg/dL (ref 65–179)
POTASSIUM: 4.1 mmol/L (ref 3.5–5.0)
PROTEIN TOTAL: 7.7 g/dL (ref 6.5–8.3)
SODIUM: 141 mmol/L (ref 135–145)

## 2018-05-20 LAB — POTASSIUM: Potassium:SCnc:Pt:Ser/Plas:Qn:: 4.1

## 2018-05-20 LAB — LARGE UNSTAINED CELLS: Lab: 2

## 2018-05-20 NOTE — Unmapped (Signed)
Bed: 01-B  Expected date: 05/20/18  Expected time: 2:58 AM  Means of arrival:   Comments:  [facility]

## 2018-05-20 NOTE — Unmapped (Signed)
MD to bedside. Pt found to be unresponsive. Pt minimally responsive to sternal rub and noxious stimuli.Pt grimacing and responding intermittently. VSS. VBG obtained.

## 2018-05-20 NOTE — Unmapped (Signed)
[facility]  Emergency Department Provider Note    ED Clinical Impression     Final diagnoses:   Dysphagia, unspecified type (Primary)       Initial Impression, ED Course, Assessment and Plan     BP 107/64  - Pulse 95  - Resp 18  - Ht 167.6 cm (5' 5.98)  - SpO2 96%  - BMI 34.96 kg/m??     Impression:  Cameron Baker is a 27 y.o. male  with past medical history of VATER syndrome, chronic esophageal strictures, esophageal dysmotility, recent pneumonia for which he was hospitalized, tracheobronchial malacia who presents for difficulty swallowing his secretions.  On presentation patient is alert and oriented in no distress, using home suctioning to suction his secretions.  No respiratory distress.  Lungs clear to auscultation bilaterally.  Speaking complete sentences, he is managing his secretions with no drooling.  Will plan for labs and admission for GI consult in the morning.    ED Course:    MAO paged for admission.    Patient placed on trilogy, will be admitted to Med G.     Additional Medical Decision Making     I reviewed the patient's past medical history.  I discussed the case with the Attending,      Any labs and radiology results that were available during my care of the patient were independently reviewed by me and considered in my medical decision making.    Portions of this record have been created using Scientist, clinical (histocompatibility and immunogenetics). Dictation errors have been sought, but may not have been identified and corrected.  ____________________________________________    I have reviewed the triage vital signs and the nursing notes.     History     Chief Complaint  Medical Problem      HPI-  Cameron Baker is a 27 y.o. male with past medical history of VATER syndrome, chronic esophageal strictures, esophageal dysmotility, recent pneumonia for which he was hospitalized, tracheobronchial malacia who presents for difficulty swallowing his secretions.  Patient states he has a history of chronic esophageal strictures that require dilation every 3 to 4 months, last dilated in March.  Over the last several days he has had worsening of his swallowing, has had difficulty eating.  Tonight he began to have difficulty tolerating his secretions and was concerned given his history of high risk of aspiration.  He did not believe he can make it to his next appointment and brought himself in for evaluation.  Patient is very familiar with the symptoms.  He was recently hospitalized for pneumonia, discharged 8 days ago.  He completed his antibiotics while inpatient, since being discharged he has been breathing well, no chest pain or trouble breathing.  Denies fevers or vomiting, he has felt nauseous.  No changes in his bowel movements.    Past Medical History:   Diagnosis Date   ??? ADHD    ??? Asthma    ??? Bipolar 1 disorder (CMS-HCC)    ??? Chronic tracheobronchitis (CMS-HCC)    ??? Constipation    ??? De Quervain's tenosynovitis    ??? Dysphagia    ??? Esophageal dysmotility    ??? Esophageal stricture    ??? GERD (gastroesophageal reflux disease)    ??? hypothyroid    ??? Seizures (CMS-HCC)    ??? Tracheobronchomalacia    ??? VATER syndrome    ??? Ventilator dependence (CMS-HCC)        Patient Active Problem List   Diagnosis   ???  Asthma   ??? Attention deficit disorder   ??? Slow transit constipation   ??? Dysphagia   ??? Esophageal reflux   ??? Hypothyroidism   ??? Class 1 obesity in adult   ??? Seizures (CMS-HCC)   ??? Gastrostomy status (CMS-HCC)   ??? Stricture and stenosis of esophagus   ??? Post traumatic stress disorder (PTSD)   ??? Mood disorder (CMS-HCC)   ??? Personality disorder (CMS-HCC)   ??? Chronic tracheobronchitis (CMS-HCC)   ??? Congenital tracheomalacia   ??? Tracheostomy dependence (CMS-HCC)   ??? VATER syndrome   ??? Muscle spasms of both lower extremities   ??? Pain and swelling of right wrist   ??? Hypogammaglobulinemia (CMS-HCC)   ??? Tracheobronchitis   ??? Coronavirus infection   ??? Triangular fibrocartilage complex injury, left, subsequent encounter   ??? Enthesopathy of right hip region   ??? Dyspnea       Past Surgical History:   Procedure Laterality Date   ??? ANAL DILATION  01/21/2006   ??? ESOPHAGOGASTRODUODENOSCOPY      multiple   ??? HIP SURGERY Right 01/19/2017    shaved   ??? LATERAL RECTUS RECESSION  11/15/2003   ??? NISSEN FUNDOPLICATION  2001    x 2 redos   ??? PORTACATH PLACEMENT  01/21/2006    taken out same year   ??? PR ESOPHAGEAL MOTILITY STUDY, MANOMETRY N/A 04/23/2018    Procedure: ESOPHAGEAL MOTILITY STUDY W/INT & REP;  Surgeon: Nurse-Based Giproc;  [facility];  Service: Gastroenterology   ??? PR UP GI ENDOSCOPY,BALL DIL,30MM N/A 02/22/2018    Procedure: UGI ENDO; W/BALLOON DILAT ESOPHAGUS (<30MM DIAM);  Surgeon: Zetta Bills, MD;  Location: [facility];  Service: Gastroenterology   ??? REMOVAL VENOUS ACCESS PORT [facility] HISTORICAL RESULT)  11/03/2006   ??? STRABISMUS SURGERY Right    ??? TRACHEOSTOMY  1994   ??? WISDOM TOOTH EXTRACTION  04/2012   ??? WRIST SURGERY      3 times.          Current Facility-Administered Medications:   ???  acetaminophen (TYLENOL) suspension 500 mg, 500 mg, G-tube, Q4H PRN, Charlesetta Garibaldi, MD  ???  albuterol (PROVENTIL HFA;VENTOLIN HFA) 90 mcg/actuation inhaler 2 puff, 2 puff, Inhalation, Q6H PRN, Charlesetta Garibaldi, MD  ???  albuterol 2.5 mg /3 mL (0.083 %) nebulizer solution 2.5 mg, 2.5 mg, Nebulization, 4x Daily (RT), Charlesetta Garibaldi, MD  ???  arformoterol (BROVANA) nebulizer solution 15 mcg/2 mL, 15 mcg, Nebulization, BID, Charlesetta Garibaldi, MD  ???  azithromycin [facility]) tablet 500 mg, 500 mg, G-tube, Once per day on Mon Wed Fri, Sydney B Greenberg, MD  ???  budesonide (PULMICORT) nebulizer solution 1 mg, 1 mg, Nebulization, BID, Charlesetta Garibaldi, MD  ???  budesonide-formoterol (SYMBICORT) 160-4.5 mcg/actuation inhaler 2 puff, 2 puff, Inhalation, BID, Charlesetta Garibaldi, MD  ???  cetirizine (ZyrTEC) tablet 10 mg, 10 mg, G-tube, QPM, Charlesetta Garibaldi, MD  ???  cholecalciferol (vitamin D3) tablet 6,000 Units, 6,000 Units, G-tube, Daily, Charlesetta Garibaldi, MD  ???  citalopram (CeleXA) tablet 40 mg, 40 mg, G-tube, Nightly, Charlesetta Garibaldi, MD  ???  dicyclomine (BENTYL) capsule 10 mg, 10 mg, G-tube, 4x Daily PRN, Charlesetta Garibaldi, MD  ???  fluticasone propionate (FLONASE) 50 mcg/actuation nasal spray 2 spray, 2 spray, Each Nare, Daily, Charlesetta Garibaldi, MD  ???  gabapentin (NEURONTIN) capsule 300 mg, 300 mg, G-tube, TID, Charlesetta Garibaldi, MD  ???  ipratropium (ATROVENT) 0.02 %  nebulizer solution 500 mcg, 500 mcg, Nebulization, 4x Daily (RT), Charlesetta Garibaldi, MD  ???  levothyroxine (SYNTHROID, LEVOTHROID) tablet 75 mcg, 75 mcg, G-tube, Daily, Charlesetta Garibaldi, MD  ???  linaclotide (LINZESS) capsule 290 mcg, 290 mcg, G-tube, Daily, Charlesetta Garibaldi, MD  ???  LORazepam (ATIVAN) tablet 0.5 mg, 0.5 mg, Oral, BID PRN, Charlesetta Garibaldi, MD  ???  minocycline (MINOCIN,DYNACIN) capsule 100 mg, 100 mg, G-tube, Nightly, Charlesetta Garibaldi, MD  ???  mirtazapine (REMERON) tablet 15 mg, 15 mg, G-tube, Nightly, Charlesetta Garibaldi, MD  ???  montelukast (SINGULAIR) tablet 10 mg, 10 mg, Oral, Nightly, Charlesetta Garibaldi, MD  ???  oxyCODONE (ROXICODONE) immediate release tablet 5 mg, 5 mg, G-tube, Q4H PRN, Charlesetta Garibaldi, MD  ???  pantoprazole (PROTONIX) EC tablet 20 mg, 20 mg, Oral, Daily, Charlesetta Garibaldi, MD  ???  polyethylene glycol (MIRALAX) packet 17 g, 17 g, G-tube, Daily PRN, Charlesetta Garibaldi, MD  ???  promethazine (PHENERGAN) oral syrup, 6.25 mg, Oral, 4x Daily PRN, Charlesetta Garibaldi, MD  ???  sodium chloride 3 % nebulizer solution 4 mL, 4 mL, Nebulization, 4x Daily (RT), Charlesetta Garibaldi, MD  ???  tiZANidine (ZANAFLEX) tablet 4 mg, 4 mg, G-tube, BID PRN, Charlesetta Garibaldi, MD  ???  valproate (DEPAKENE) oral syrup, 250 mg, Oral, Daily, Charlesetta Garibaldi, MD  ???  valproate (DEPAKENE) oral syrup, 750 mg, G-tube, Nightly, Charlesetta Garibaldi, MD    Current Outpatient Medications:   ???  acetaminophen (TYLENOL) 160 mg/5 mL (5 mL) suspension, 15.6 mL (500 mg total) by G-tube route every four (4) hours as needed for pain., Disp: 1 Bottle, Rfl: 0  ???  albuterol 2.5 mg /3 mL (0.083 %) nebulizer solution, Inhale 3 mL (2.5 mg total) by nebulization 4 (four) times a day. Prior to HyperTonic Saline, Disp: 360 mL, Rfl: 1  ???  albuterol HFA 90 mcg/actuation inhaler, Inhale 2 puffs every six (6) hours as needed for wheezing., Disp: 1 Inhaler, Rfl: 11  ???  arformoterol (BROVANA), Inhale 2 mL (15 mcg total) by nebulization Two (2) times a day., Disp: 120 mL, Rfl: 11  ???  azithromycin (ZITHROMAX) 500 MG tablet, 1 tablet (500 mg total) by G-tube route 3 (three) times a week., Disp: 12 tablet, Rfl: 11  ???  budesonide (PULMICORT) 0.5 mg/2 mL nebulizer solution, Inhale 4 mL (1 mg total) by nebulization Two (2) times a day., Disp: 240 mL, Rfl: 11  ???  budesonide-formoterol (SYMBICORT) 160-4.5 mcg/actuation inhaler, Inhale 2 puffs Two (2) times a day., Disp: 1 Inhaler, Rfl: 11  ???  cetirizine (ZYRTEC) 10 MG tablet, 1 tablet (10 mg total) by G-tube route every evening., Disp: , Rfl: 0  ???  cholecalciferol, vitamin D3, (VITAMIN D3) 2,000 unit cap, 3 capsules (6,000 Units total) by G-tube route daily., Disp: , Rfl: 0  ???  citalopram (CELEXA) 40 MG tablet, 1 tablet (40 mg total) by G-tube route nightly., Disp: 30 tablet, Rfl: 2  ???  clindamycin (CLINDAGEL) 1 % gel, Apply topically Two (2) times a day., Disp: 60 g, Rfl: 6  ???  diclofenac sodium (VOLTAREN) 1 % gel, Apply 2 g topically Four (4) times a day., Disp: 100 g, Rfl: 6  ???  dicyclomine (BENTYL) 10 mg capsule, 10 mg by G-tube route 4 (four) times a day as needed. , Disp: , Rfl:   ???  fluticasone (FLONASE) 50 mcg/actuation nasal spray, 2 sprays by Each Nare route daily. , Disp: ,  Rfl:   ???  gabapentin (NEURONTIN) 300 MG capsule, 300 mg by G-tube route Three (3) times a day., Disp: , Rfl:   ???  ibuprofen (ADVIL,MOTRIN) 600 MG tablet, 1 tablet (600 mg total) by G-tube route every six (6) hours as needed for pain., Disp: 30 tablet, Rfl: 1  ???  ipratropium (ATROVENT) 0.02 % nebulizer solution, Inhale 2.5 mL (500 mcg total) by nebulization 4 (four) times a day., Disp: 360 mL, Rfl: 1  ???  levothyroxine (SYNTHROID, LEVOTHROID) 75 MCG tablet, 1 tablet (75 mcg total) by G-tube route daily., Disp: 90 tablet, Rfl: 3  ???  linaclotide (LINZESS) 290 mcg capsule, 1 capsule (290 mcg total) by G-tube route daily., Disp: 30 capsule, Rfl: 0  ???  LORazepam (ATIVAN) 0.5 MG tablet, Take 1 tablet (0.5mg ) twice daily and 1 tablet (0.5mg ) daily as needed for anxiety via G-tube, Disp: 70 tablet, Rfl: 2  ???  minocycline (MINOCIN,DYNACIN) 100 MG capsule, 1 capsule (100 mg total) by G-tube route nightly., Disp: 90 capsule, Rfl: 3  ???  mirtazapine (REMERON) 15 MG tablet, 1 tablet (15 mg total) by G-tube route nightly., Disp: 30 tablet, Rfl: 2  ???  montelukast (SINGULAIR) 10 mg tablet, Take 1 tablet (10 mg total) by mouth nightly., Disp: 30 tablet, Rfl: 11  ???  omeprazole (PRILOSEC) 20 MG capsule, 20 mg Two (2) times a day (30 minutes before a meal). Via G-tube, Disp: , Rfl:   ???  oxyCODONE (ROXICODONE) 5 MG immediate release tablet, 5 mg by G-tube route every four (4) hours as needed for pain. , Disp: , Rfl:   ???  PARI LC D NEBULIZER Misc, Provide 1 device  for use with inhaled medication., Disp: 2 each, Rfl: 11  ???  polyethylene glycol (MIRALAX) 17 gram packet, 17 g by G-tube route daily as needed., Disp: 20 packet, Rfl: 0  ???  sodium chloride 3 % nebulizer solution, Inhale 4 mL by nebulization 4 (four) times a day., Disp: 480 mL, Rfl: 1  ???  tiZANidine (ZANAFLEX) 4 MG tablet, 1 tablet (4 mg total) by G-tube route Three (3) times a day as needed. (Patient taking differently: 4 mg by G-tube route two (2) times a day as needed. ), Disp: 30 tablet, Rfl: 0  ???  tobramycin, PF, (TOBI) 300 mg/5 mL nebulizer solution, Inhale 5 mL (300 mg total) by nebulization every twelve (12) hours. Every other month, Disp: 280 mL, Rfl: 5  ???  triamcinolone (KENALOG) 0.1 % cream, Apply topically Two (2) times a day., Disp: 15 g, Rfl: 11  ???  valproate (DEPAKENE) 250 mg/5 mL syrup, TAKE BY G-TUBE EVERY MORNING AND EVERY EVENING, Disp: 1850 mL, Rfl: 0    Allergies  Xopenex [levalbuterol hcl]; Allopurinol analogues; and Versed [midazolam]    Family History   Adopted: Yes       Social History  Social History     Tobacco Use   ??? Smoking status: Never Smoker   ??? Smokeless tobacco: Never Used   Substance Use Topics   ??? Alcohol use: Yes     Alcohol/week: 1.2 oz     Types: 2 Cans of beer per week     Comment: on weekends sometimes   ??? Drug use: No       REVIEW OF SYSTEMS: A 10 point review of systems was performed and is otherwise negative except for pertinent positives and negatives as stated above.    Physical Exam     ED Triage Vitals  Enc Vitals Group      BP       Pulse       SpO2 Pulse       Resp       Temp       Temp src       SpO2       Weight       Height       Head Circumference       Peak Flow       Pain Score       Pain Loc       Pain Edu?       Excl. in GC?        Constitutional: Alert and oriented, Well appearing and in NAD and trach in place, breathing comfortably  Eyes: Conjunctivae are normal.  ENT       Head: Normocephalic.         Nose: No congestion.       Mouth/Throat: Mucous membranes are moist.         Neck: No stridor. Trach in place  Cardiovascular:  Normal rate, regular rhythm.  and No bruising, abrasions, crepitus or other signs of trauma  Respiratory: Normal WOB, LCTAB, no wheezing or rhonchi, speaking in complete sentences  Gastrointestinal: Soft, non-tender, non distended.  Musculoskeletal:  No gross deformities  Neurologic: Normal speech and language; no gross neurological deficits  Skin: Skin is warm, dry and intact.    Psychiatric: Mood and affect are normal. Speech and behavior are normal.            Paschal Dopp, MD  Resident  05/20/18 [phone]

## 2018-05-20 NOTE — Unmapped (Signed)
Gi fellow on service called as patient is being admitted due to dysphagia. Last egd notable for two esophageal strictures ( proximal esophagus and near GE junction). Recent manometry notable for type II achalasia but lack of peristalsis may in fact be due to the esophageal strictures vs an underlying lack of peristalsis. Barium swallow shows delay in barium passage. Plan for EGD with dilation of potential strictures. Once strictures dilated, will consider a repeat manometry if dysphagia persists.     Of note, pt respiratory issues surrounding dilation of the more proximal stricture. Recommendation per last EGD was the recommendation to use a reinforced endotracheal tube per tracheostomy for future dilation of proximal esophagus to avoid tracheal obstruction with endoscopic ballon   Inflation.    Cameron Mikus,MD  Department of Gastroenterology and Hepatology  [phone]

## 2018-05-20 NOTE — Unmapped (Signed)
Baird Lyons RN assuming care. Patient asleep at this time. Laying on L side. Attached to trach vent . Equal chest rise observed. Patient appears in NAD. [place] attached to suction in L hand. Continuous pulse ox placed, satting 99%. HR regular. Normotensive. Spontaneously moving bilateral feet.     Will allow patient to rest at this time. Awaiting admission bed.     Patient rounding complete, call bell in reach, bed locked and in lowest position, patient belongings at bedside and within reach of patient.  Patient not updated on plan of care due to being asleep.

## 2018-05-20 NOTE — Unmapped (Signed)
I was in contact with [name] at [facility], she is aware that Matan is admitted and that he will only be admitted for a short time.  She is contacting Suzan who should deliver the cough assist.   Re: Trilogy [facility] home health RN has not completed her Trilogy training, so the Trilogy can not be delivered.

## 2018-05-20 NOTE — Unmapped (Signed)
Pt. resting with eyes closed on stretcher. RR even and unlabored. Chest rise symmetrical and bilateral. NAD noted. Bed locked and in lowest position. Call bell within reach. Patient belongings at bedside. No other needs identified.

## 2018-05-20 NOTE — Unmapped (Signed)
Patient with home trach, states he is due for his esophageal stricture dilation. States increased production of secretions and requiring oral suctioning. resp even and unlabored

## 2018-05-20 NOTE — Unmapped (Signed)
Report given to [employer]. Patient care transferred at this time.

## 2018-05-20 NOTE — Unmapped (Signed)
Pt BIB EMS from home. Pt chronic tracheostomy and hx of esophageal stricture. Pt states he has his stricture stretcher q3-4 months. Pt states about 1 week ago, he started having symptoms r/t his stricture. Today, he's been unable to even swallow his secretions. pt's airway is intact. Managing secretions w/ self suctioning. Pt denies pain. Introduced self to patient, pt respirations unlabored and regular at this time. Pt a&ox4, denies needs and is resting in bed. Personal belongings within reach. Call bell within reach. Bed in locked and in lowest position. Will continue to monitor.
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
## 2018-05-20 NOTE — Unmapped (Signed)
Patient transported to GI  Transported by Transport Team  How tranported Chief Financial Officer no

## 2018-05-20 NOTE — Unmapped (Signed)
ED RT at bedside. Pt placed on BIPAP at this time. Pt typically wears at home at night.

## 2018-05-20 NOTE — Unmapped (Signed)
Patient asleep at this time. Attached to trach vent . Equal chest rise observed. Patient appears in NAD. Yankeur attached to suction in L hand. Continuous pulse ox placed. HR regular. Normotensive.     Will allow patient to rest at this time. Bed pre-assigned.

## 2018-05-20 NOTE — Unmapped (Signed)
Patient complaining of nausea. [facility] dose pf phenergran. Patient rounding complete, call bell in reach, bed locked and in lowest position, patient belongings at bedside and within reach of patient.  Patient updated on plan of care.

## 2018-05-20 NOTE — Unmapped (Signed)
I visited with Mr. Armentor.  He stated that his homecare RN never went to [facility] because they never called her back to confirm a time.  I asked Li to have his nurse accompany him to his next appointment with Dr. Garner Nash on June 18th at 0900.  I contacted [name] at [facility], they are still trying to arrange deliver of the cough assist to Mr. Mesta at [facility] today.  I notified [name] of Murphy's next appointment and requested that they set up training and delivery of the Trilogy.

## 2018-05-20 NOTE — Unmapped (Signed)
Patient rounding complete, call bell in reach, bed locked and in lowest position, side rails up x2, patient belongings at bedside and within reach of patient.  Patient updated on plan of care.

## 2018-05-20 NOTE — Unmapped (Signed)
Patient awake and alert. In NAD. Patient took self off of vent. Breathing remains even and unlabored. Sp02 showing 99% on RA. All other vitals remains WNL. Patient complaining of pain to throat, describing it as stratches in my throat. Patient currently managing secretions using yankeur suction, though copious secretions are not noted. Medicated per MAR via G tube.     Awaiting admission bed.     Patient rounding complete, call bell in reach, bed locked and in lowest position, patient belongings at bedside and within reach of patient.  Patient updated on plan of care.

## 2018-05-20 NOTE — Unmapped (Signed)
Inpatient Surgery Update      PRE-OP DOCUMENTATION: PROVIDER CERTIFICATION    I certify that the appropriate documentation of the patient's evaluation, assessment and treatment plan is found within the medical record and that the patient???s condition is unchanged from this earlier assessment.    SITE MARKING ATTESTATION    Site Marked: Not required    CONSENT FOR OPERATION OR PROCEDURE: PROVIDER CERTIFICATION    I hereby certify that the nature, purpose, benefits, usual and most frequent risks of, and alternatives to, the operation or procedure have been explained to the patient (or person authorized to sign for the patient) either by a physician or by the provider who is to perform the operation or procedure, that the patient has had an opportunity to ask questions, and that those questions have been answered. The patient or the patient's representatiive has been advised that the selected tasks may be performed by assistants to the primary health care provider(s). I believe that the patient (or person authorized to sign for the patient) understands what has been explained, and has consented to the operation or procedure.

## 2018-05-20 NOTE — Unmapped (Signed)
Admit team at bedside.

## 2018-05-20 NOTE — Unmapped (Signed)
GASTROENTEROLOGY  INPATIENT CONSULTATION H&P      Requesting Attending Physician:  No att. providers found  Requesting Consult Service: Med G    Reason for Consult:    Cameron Baker is a 27 y.o. male seen in consultation at the request of Cameron Baker for dysphagia.    ASSESSMENT / PLAN     27 y.o. 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 He had a history of esophageal stenosis x2, previously treated with balloon dilation in March of this year. He has been trying to arrange follow up and repeat EGD with Cameron Baker as he was recommended to have a repeat EGD in 3 months. His dysphagia symptoms are currently worsening, and similar to how he felt prior to his previous dilations. He denies any odynophagia, nausea, or vomiting. He would benefit from endoscopic evaluation of his stenoses while inpatient.    RECOMMENDATIONS:  - Will plan for EGD and balloon dilation today  - Will likely be stable for discharge following procedure  - He will follow up with his gastroenterologist Cameron Baker for possible repeat manometry as an outpatient    Thank you for this consult. Seen and discussed with Cameron Baker. We will continue to follow along. Please page the [facility] consult pager at [phone] with any further questions.    Cameron Madrid, MD  Internal Medicine, PGY-1    SUBJECTIVE:     Chief Complaint: dysphagia    History of Present Illness:   This is a 27 y.o. 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 He states that he has been in his normal state of health, but over the last few days has been coughing more, had a harder time swallowing his own saliva, and has had to chew food for much longer and be more deliberate when swallowing. He denies any odynophagia, just that it feels like pressure when he swallows, which is consistent with his previous stenotic symptoms. He has had multiple hospitalizations this year for respiratory issues (uses a Trilogy ventilator nightly and has had recurrent tracheitis), but in March of this year had significant dysphagia. He had an EGD at that time that showed a 2cm stenosis of his prior tracheoesophageal repair as well as a 1cm stenosis at the GE junction. He underwent balloon dilation of both areas (to 18 and 20mm, respectively) while admitted and states that his symptoms were much improved after that. It was recommended that he undergo follow EGD in about 3 month. In the interim, he has had esophageal manometry that was diagnostic for type II achalasia. He states he has been trying to set up his follow up EGD but has not been able to, and his symptoms were bad enough these last few days that he felt he needed to come to the hospital. He denies frank or large aspiration events, denies nausea or vomiting, no fevers or chills. He denies shortness of breath. He states he feels well other than his difficulty swallowing.    Review of Systems:  The balance of 12 systems reviewed is negative except as noted in the HPI.     MEDICAL HISTORY:     Past Medical History:  Past Medical History:   Diagnosis Date   ??? ADHD    ???  Asthma    ??? Bipolar 1 disorder (CMS-HCC)    ??? Chronic tracheobronchitis (CMS-HCC)    ??? Constipation    ??? De Quervain's tenosynovitis    ??? Dysphagia    ??? Esophageal dysmotility    ??? Esophageal stricture    ??? GERD (gastroesophageal reflux disease)    ??? hypothyroid    ??? Seizures (CMS-HCC)    ??? Tracheobronchomalacia    ??? VATER syndrome    ??? Ventilator dependence (CMS-HCC)        Surgical History:  Past Surgical History:   Procedure Laterality Date   ??? ANAL DILATION  01/21/2006   ??? ESOPHAGOGASTRODUODENOSCOPY      multiple   ??? HIP SURGERY Right 01/19/2017    shaved   ??? LATERAL RECTUS RECESSION  11/15/2003   ??? NISSEN FUNDOPLICATION  2001    x 2 redos   ??? PORTACATH PLACEMENT  01/21/2006    taken out same year   ??? PR ESOPHAGEAL MOTILITY STUDY, MANOMETRY N/A 04/23/2018    Procedure: ESOPHAGEAL MOTILITY STUDY W/INT & REP;  Surgeon: Nurse-Based Giproc;  [facility];  Service: Gastroenterology   ??? PR UP GI ENDOSCOPY,BALL DIL,30MM N/A 02/22/2018    Procedure: UGI ENDO; W/BALLOON DILAT ESOPHAGUS (<30MM DIAM);  Surgeon: Zetta Bills, MD;  Location: [facility];  Service: Gastroenterology   ??? REMOVAL VENOUS ACCESS PORT [facility] HISTORICAL RESULT)  11/03/2006   ??? STRABISMUS SURGERY Right    ??? TRACHEOSTOMY  1994   ??? WISDOM TOOTH EXTRACTION  04/2012   ??? WRIST SURGERY      3 times.        Family History:  The patient's family history is not on file. He was adopted..    Medications:   Current Facility-Administered Medications   Medication Dose Route Frequency Provider Last Rate Last Dose   ??? acetaminophen (TYLENOL) suspension 500 mg  500 mg G-tube Q4H PRN Cameron Garibaldi, MD       ??? albuterol (PROVENTIL HFA;VENTOLIN HFA) 90 mcg/actuation inhaler 2 puff  2 puff Inhalation Q6H PRN Cameron Garibaldi, MD       ??? albuterol 2.5 mg /3 mL (0.083 %) nebulizer solution 2.5 mg  2.5 mg Nebulization 4x Daily (RT) Cameron Garibaldi, MD   2.5 mg at 05/20/18 1247   ??? arformoterol (BROVANA) nebulizer solution 15 mcg/2 mL  15 mcg Nebulization BID Cameron Garibaldi, MD   15 mcg at 05/20/18 0929   ??? azithromycin [facility]) tablet 500 mg  500 mg G-tube Once per day on Mon Wed Fri Cameron Garibaldi, MD   Stopped at 05/20/18 0801   ??? budesonide (PULMICORT) nebulizer solution 1 mg  1 mg Nebulization BID Cameron Garibaldi, MD   1 mg at 05/20/18 7829   ??? cetirizine (ZyrTEC) tablet 10 mg  10 mg G-tube QPM Cameron Garibaldi, MD       ??? cholecalciferol (vitamin D3) tablet 6,000 Units  6,000 Units G-tube Daily Cameron Garibaldi, MD   6,000 Units at 05/20/18 0817   ??? citalopram (CeleXA) tablet 40 mg  40 mg G-tube Nightly Cameron Garibaldi, MD       ??? dicyclomine (BENTYL) capsule 10 mg  10 mg G-tube 4x Daily PRN Cameron Garibaldi, MD       ??? fluticasone propionate (FLONASE) 50 mcg/actuation nasal spray 2 spray  2 spray Each Nare Daily Cameron Garibaldi, MD   Stopped at 05/20/18 [id number]   ??? gabapentin (NEURONTIN)  capsule 300 mg  300 mg G-tube TID Cameron Garibaldi, MD   300 mg at 05/20/18 0817   ??? ipratropium (ATROVENT) 0.02 % nebulizer solution 500 mcg  500 mcg Nebulization 4x Daily (RT) Cameron Garibaldi, MD   500 mcg at 05/20/18 1247   ??? levothyroxine (SYNTHROID, LEVOTHROID) tablet 75 mcg  75 mcg G-tube Daily Cameron Garibaldi, MD   75 mcg at 05/20/18 0817   ??? LORazepam (ATIVAN) tablet 0.5 mg  0.5 mg Oral BID PRN Cameron Garibaldi, MD       ??? minocycline (MINOCIN,DYNACIN) capsule 100 mg  100 mg G-tube Nightly Cameron Garibaldi, MD       ??? mirtazapine (REMERON) tablet 15 mg  15 mg G-tube Nightly Cameron Garibaldi, MD       ??? montelukast (SINGULAIR) tablet 10 mg  10 mg Oral Nightly Cameron Garibaldi, MD       ??? oxyCODONE (ROXICODONE) immediate release tablet 5 mg  5 mg G-tube Q4H PRN Cameron Garibaldi, MD   5 mg at 05/20/18 0981   ??? pantoprazole (PROTONIX) EC tablet 20 mg  20 mg Oral Daily Cameron Garibaldi, MD   20 mg at 05/20/18 0817   ??? polyethylene glycol (MIRALAX) packet 17 g  17 g G-tube Daily PRN Cameron Garibaldi, MD       ??? promethazine (PHENERGAN) oral syrup  6.25 mg Oral 4x Daily PRN Cameron Garibaldi, MD   6.25 mg at 05/20/18 1124   ??? sodium chloride 3 % nebulizer solution 4 mL  4 mL Nebulization 4x Daily (RT) Cameron Garibaldi, MD   4 mL at 05/20/18 1914   ??? tiZANidine (ZANAFLEX) tablet 4 mg  4 mg G-tube BID PRN Cameron Garibaldi, MD       ??? valproate (DEPAKENE) oral syrup  250 mg Oral Daily Cameron Garibaldi, MD   250 mg at 05/20/18 7829   ??? valproate (DEPAKENE) oral syrup  750 mg G-tube Nightly Cameron Garibaldi, MD         Current Outpatient Medications Medication Sig Dispense Refill   ??? acetaminophen (TYLENOL) 160 mg/5 mL (5 mL) suspension 15.6 mL (500 mg total) by G-tube route every four (4) hours as needed for pain. 1 Bottle 0   ??? albuterol 2.5 mg /3 mL (0.083 %) nebulizer solution Inhale 3 mL (2.5 mg total) by nebulization 4 (four) times a day. Prior to HyperTonic Saline 360 mL 1   ??? albuterol HFA 90 mcg/actuation inhaler Inhale 2 puffs every six (6) hours as needed for wheezing. 1 Inhaler 11   ??? arformoterol (BROVANA) Inhale 2 mL (15 mcg total) by nebulization Two (2) times a day. 120 mL 11   ??? azithromycin (ZITHROMAX) 500 MG tablet 1 tablet (500 mg total) by G-tube route 3 (three) times a week. 12 tablet 11   ??? budesonide (PULMICORT) 0.5 mg/2 mL nebulizer solution Inhale 4 mL (1 mg total) by nebulization Two (2) times a day. 240 mL 11   ??? budesonide-formoterol (SYMBICORT) 160-4.5 mcg/actuation inhaler Inhale 2 puffs Two (2) times a day. 1 Inhaler 11   ??? cetirizine (ZYRTEC) 10 MG tablet 1 tablet (10 mg total) by G-tube route every evening.  0   ??? cholecalciferol, vitamin D3, (VITAMIN D3) 2,000 unit cap 3 capsules (6,000 Units total) by G-tube route daily.  0   ??? citalopram (CELEXA) 40 MG tablet 1 tablet (40 mg total) by G-tube route nightly. 30 tablet 2   ???  clindamycin (CLINDAGEL) 1 % gel Apply topically Two (2) times a day. 60 g 6   ??? diclofenac sodium (VOLTAREN) 1 % gel Apply 2 g topically Four (4) times a day. 100 g 6   ??? dicyclomine (BENTYL) 10 mg capsule 10 mg by G-tube route 4 (four) times a day as needed.      ??? fluticasone (FLONASE) 50 mcg/actuation nasal spray 2 sprays by Each Nare route daily.      ??? gabapentin (NEURONTIN) 300 MG capsule 300 mg by G-tube route Three (3) times a day.     ??? ibuprofen (ADVIL,MOTRIN) 600 MG tablet 1 tablet (600 mg total) by G-tube route every six (6) hours as needed for pain. 30 tablet 1   ??? ipratropium (ATROVENT) 0.02 % nebulizer solution Inhale 2.5 mL (500 mcg total) by nebulization 4 (four) times a day. 360 mL 1   ??? levothyroxine (SYNTHROID, LEVOTHROID) 75 MCG tablet 1 tablet (75 mcg total) by G-tube route daily. 90 tablet 3   ??? linaclotide (LINZESS) 290 mcg capsule 1 capsule (290 mcg total) by G-tube route daily. 30 capsule 0   ??? LORazepam (ATIVAN) 0.5 MG tablet Take 1 tablet (0.5mg ) twice daily and 1 tablet (0.5mg ) daily as needed for anxiety via G-tube 70 tablet 2   ??? minocycline (MINOCIN,DYNACIN) 100 MG capsule 1 capsule (100 mg total) by G-tube route nightly. 90 capsule 3   ??? mirtazapine (REMERON) 15 MG tablet 1 tablet (15 mg total) by G-tube route nightly. 30 tablet 2   ??? montelukast (SINGULAIR) 10 mg tablet Take 1 tablet (10 mg total) by mouth nightly. 30 tablet 11   ??? omeprazole (PRILOSEC) 20 MG capsule 20 mg Two (2) times a day (30 minutes before a meal). Via G-tube     ??? oxyCODONE (ROXICODONE) 5 MG immediate release tablet 5 mg by G-tube route every four (4) hours as needed for pain.      ??? PARI LC D NEBULIZER Misc Provide 1 device  for use with inhaled medication. 2 each 11   ??? polyethylene glycol (MIRALAX) 17 gram packet 17 g by G-tube route daily as needed. 20 packet 0   ??? sodium chloride 3 % nebulizer solution Inhale 4 mL by nebulization 4 (four) times a day. 480 mL 1   ??? tiZANidine (ZANAFLEX) 4 MG tablet 1 tablet (4 mg total) by G-tube route Three (3) times a day as needed. (Patient taking differently: 4 mg by G-tube route two (2) times a day as needed. ) 30 tablet 0   ??? tobramycin, PF, (TOBI) 300 mg/5 mL nebulizer solution Inhale 5 mL (300 mg total) by nebulization every twelve (12) hours. Every other month 280 mL 5   ??? triamcinolone (KENALOG) 0.1 % cream Apply topically Two (2) times a day. 15 g 11   ??? valproate (DEPAKENE) 250 mg/5 mL syrup TAKE BY G-TUBE EVERY MORNING AND EVERY EVENING 1850 mL 0       Allergies:   Xopenex [levalbuterol hcl]; Allopurinol analogues; and Versed [midazolam]    Social History:  Social History     Tobacco Use   ??? Smoking status: Never Smoker   ??? Smokeless tobacco: Never Used   Substance Use Topics   ??? Alcohol use: Yes     Alcohol/week: 1.2 oz     Types: 2 Cans of beer per week     Comment: on weekends sometimes   ??? Drug use: No       Objective:  Vital Signs/Weight:  Temp:  [36.8 ??C] 36.8 ??C  Heart Rate:  [95] 95  SpO2 Pulse:  [75-92] 92  Resp:  [18] 18  BP: (107-115)/(64-73) 115/65  FiO2 (%):  [21 %] 21 %  SpO2:  [96 %-100 %] 97 %  Wt Readings from Last 3 Encounters:   05/12/18 98.2 kg (216 lb 7.9 oz)   04/23/18 90.7 kg (200 lb)   04/16/18 92.3 kg (203 lb 6.4 oz)       Physical Exam:  Constitutional: No acute distress  HEENT: PERRL, conjunctiva clear, oropharynx clear, neck supple, no LAD, trach in place.  CV: RRR, normal S1, S2. No murmurs. No JVD.  Lung: No respiratory distress. Unlabored breathing. No wheezes or rhonchi.  Abd: soft, NTND. No organomegaly. No ascites.   Extremities: No edema, well perfused  MSK: No joint swelling or tenderness noted, no deformities  Skin: No rashes, jaundice or skin lesions noted      DIAGNOSTIC STUDIES     I reviewed all pertinent diagnostic studies, including:      Labs:    Recent Labs     05/20/18  0320 05/20/18  0411   WBC 11.5*  --    HGB 13.5 13.5   HCT 40.9*  --    PLT 615*  --      Recent Labs     05/20/18  0320 05/20/18  0411   NA 141 137   K 4.1 3.5   CL 103  --    BUN 8  --    CREATININE 0.74  --    GLU 108  --      Recent Labs     05/20/18  0320   PROT 7.7   ALBUMIN 4.9   AST 35   ALT 36   ALKPHOS 51   BILITOT 0.4     No results for input(s): INR, APTT, FIBRINOGEN in the last 72 hours.  No results for input(s): CRP in the last 72 hours.  No results for input(s): IRON, TIBC, FERRITIN in the last 72 hours.    Imaging:   Radiology studies were personally reviewed.     Microbiology: N/A    GI Procedures:   EGD 02/22/18:  Impression: - Benign-appearing esophageal stenosis at GEJ. Dilated.                     - Benign-appearing esophageal stenosis at suspected prior                      fistula repair site. Dilated.                     - Intact gastrostomy with a patent G-tube present                      characterized by healthy appearing mucosa.                     - Two lesions suspicious for aberrant pancreas were found                      in the stomach.                     - Normal examined duodenum.                     - No specimens collected.

## 2018-05-21 LAB — BASIC METABOLIC PANEL
ANION GAP: 12 mmol/L (ref 9–15)
BLOOD UREA NITROGEN: 10 mg/dL (ref 7–21)
BUN / CREAT RATIO: 14
CHLORIDE: 102 mmol/L (ref 98–107)
CO2: 25 mmol/L (ref 22.0–30.0)
CREATININE: 0.71 mg/dL (ref 0.70–1.30)
EGFR MDRD AF AMER: >=60 mL/min/{1.73_m2} (ref >=60)
EGFR MDRD NON AF AMER: >=60 mL/min/{1.73_m2} (ref >=60)
GLUCOSE RANDOM: 113 mg/dL (ref 65–179)
POTASSIUM: 4.2 mmol/L (ref 3.5–5.0)
SODIUM: 139 mmol/L (ref 135–145)

## 2018-05-21 LAB — CBC W/ AUTO DIFF
BASOPHILS ABSOLUTE COUNT: 0 10*9/L (ref 0.0–0.1)
BASOPHILS RELATIVE PERCENT: 0.4 %
EOSINOPHILS ABSOLUTE COUNT: 0.1 10*9/L (ref 0.0–0.4)
EOSINOPHILS RELATIVE PERCENT: 0.7 %
HEMATOCRIT: 38.4 % — ABNORMAL LOW (ref 41.0–53.0)
HEMOGLOBIN: 12.4 g/dL — ABNORMAL LOW (ref 13.5–17.5)
LYMPHOCYTES ABSOLUTE COUNT: 1.7 10*9/L (ref 1.5–5.0)
MEAN CORPUSCULAR HEMOGLOBIN CONC: 32.3 g/dL (ref 31.0–37.0)
MEAN CORPUSCULAR HEMOGLOBIN: 25.3 pg — ABNORMAL LOW (ref 26.0–34.0)
MEAN PLATELET VOLUME: 7.2 fL (ref 7.0–10.0)
MONOCYTES ABSOLUTE COUNT: 0.4 10*9/L (ref 0.2–0.8)
MONOCYTES RELATIVE PERCENT: 5.3 %
NEUTROPHILS ABSOLUTE COUNT: 6 10*9/L (ref 2.0–7.5)
NEUTROPHILS RELATIVE PERCENT: 71.9 %
PLATELET COUNT: 423 10*9/L (ref 150–440)
RED BLOOD CELL COUNT: 4.92 10*12/L (ref 4.50–5.90)
RED CELL DISTRIBUTION WIDTH: 15.3 % — ABNORMAL HIGH (ref 12.0–15.0)
WBC ADJUSTED: 8.4 10*9/L (ref 4.5–11.0)

## 2018-05-21 LAB — EGFR MDRD AF AMER: Glomerular filtration rate/1.73 sq M.predicted.black:ArVRat:Pt:Ser/Plas/Bld:Qn:Creatinine-based formula (MDRD): >=60

## 2018-05-21 LAB — NEUTROPHILS ABSOLUTE COUNT: Lab: 6

## 2018-05-21 MED ORDER — PROMETHAZINE 6.25 MG/5 ML ORAL SYRUP
Freq: Four times a day (QID) | ORAL | 0 refills | 0.00000 days | Status: CP | PRN
Start: 2018-05-21 — End: 2018-06-01

## 2018-05-21 MED ORDER — CODEINE 6.3 MG-GUAIFENESIN 100 MG/5 ML ORAL LIQUID
ORAL | 0 refills | 0.00000 days | Status: CP
Start: 2018-05-21 — End: 2018-05-28

## 2018-05-21 MED ORDER — CODEINE 10 MG-GUAIFENESIN 100 MG/5 ML ORAL LIQUID
Freq: Three times a day (TID) | ORAL | 0 refills | 0.00000 days | Status: SS | PRN
Start: 2018-05-21 — End: 2018-05-26

## 2018-05-21 NOTE — Unmapped (Signed)
Pt AOx4, VSS. Was little more drowsy at 0800 assessment but was alert by 1000 (per his baseline). Vent when sleeping, RA w speaking valve when awake. NSR on monitors when wearing them- independent walking around room. Turning independently. Adequate UO. Good PO intake. No c/o pain. Nausea managed through PRN medication. No further complaints or requests. Awaiting discharge orders.      Problem: Adult Inpatient Plan of Care  Goal: Plan of Care Review  Outcome: Progressing  Goal: Patient-Specific Goal (Individualization)  Outcome: Progressing  Goal: Absence of Hospital-Acquired Illness or Injury  Outcome: Progressing  Goal: Optimal Comfort and Wellbeing  Outcome: Progressing  Goal: Readiness for Transition of Care  Outcome: Progressing  Goal: Rounds/Family Conference  Outcome: Progressing     Problem: Fall Injury Risk  Goal: Absence of Fall and Fall-Related Injury  Outcome: Progressing     Problem: Venous Thromboembolism  Goal: VTE (Venous Thromboembolism) Symptom Resolution  Outcome: Progressing     Problem: Swallowing Impairment  Goal: Improved Swallowing Without Aspiration  Outcome: Progressing     Problem: Seizure Disorder Comorbidity  Goal: Maintenance of Seizure Control  Outcome: Progressing

## 2018-05-21 NOTE — Unmapped (Signed)
Pt AOx4, VSS. Came up from GI procedures. Watching o/n. Little drowsier than usual appropriate and watching TV on computer. Diet placed, awaiting tray. Pt tolerating ice water. No c/o pain. No further complaints or requests. Will continue to monitor and assess.      Problem: Adult Inpatient Plan of Care  Goal: Plan of Care Review  Outcome: Progressing  Goal: Patient-Specific Goal (Individualization)  Outcome: Progressing  Goal: Absence of Hospital-Acquired Illness or Injury  Outcome: Progressing  Goal: Optimal Comfort and Wellbeing  Outcome: Progressing  Goal: Readiness for Transition of Care  Outcome: Progressing  Goal: Rounds/Family Conference  Outcome: Progressing     Problem: Fall Injury Risk  Goal: Absence of Fall and Fall-Related Injury  Outcome: Progressing     Problem: Venous Thromboembolism  Goal: VTE (Venous Thromboembolism) Symptom Resolution  Outcome: Progressing     Problem: Swallowing Impairment  Goal: Improved Swallowing Without Aspiration  Outcome: Progressing

## 2018-05-21 NOTE — Unmapped (Signed)
Patient tolerated scheduled inhaled medications.  Trach patent, clean, and midline.  Patient on BIPAP overnight on settings as documented.

## 2018-05-21 NOTE — Unmapped (Signed)
APS delivered Cameron Baker's new cough assist.  Plan is to have nurse trained on the Trilogy on Monday and delivery of the Trilogy on Tuesday and Cameron Baker's clinic visit.

## 2018-05-21 NOTE — Unmapped (Signed)
Physician Discharge Summary    Identifying Information:   Cameron Baker  08-06-1991  [id number]    Admit date: 05/20/2018    Discharge date: 05/21/2018     Discharge Service: Pulmonology (MDG)    Discharge Attending Physician: Konrad Dolores, MD    Discharge to: Home    Discharge Diagnoses:  Principal Problem:    Dysphagia  Active Problems:    Esophageal reflux    Hypothyroidism    Stricture and stenosis of esophagus    Post traumatic stress disorder (PTSD)    Chronic tracheobronchitis (CMS-HCC)    Tracheostomy dependence (CMS-HCC)    VATER syndrome  Resolved Problems:    * No resolved hospital problems. *    Hospital Course:   Cameron Baker is a 27 y.o. male with  history of VATER syndrome s/p trach/peg w/ frequent admissions for tracheobronchitis, hypothyroidism, asthma, mood d/o who presented to [facility] on 05/20/18 with  progressive dysphagia to solids and liquids.    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 Follows with Dr. Wilford Corner [facility] G[facility]).  Patient had  progressive dysphagia to solids and liquids of 2-weeks duration that got worse the previous night. GI was consulted. Patient had EGD with balloon dilation on the same day. He was found to have two benign appearing esophageal stenosis, which were dilated. Patient was also noted to have scalloped mucosa in the duodenum, suspicious for celiac disease, which was biopsied.  Patient stayed overnight and had no procedural complications. His medications, including Protonix were administered through his G-tube and he resumed normal diet the same day, tolerating both solids and liquids. Pathology results are pending. Patient will follow up with his gastroenterologist, Dr. Wilford Corner, for possible repeat manometry outpatient.   ??  Chronic Tracheobronchitis: VATER Syndrome-associated??tracheobronchomalacia with tracheostomy placement and nocturnal mechanical ventilation. He experiences repeated episodes of tracheobronchitis as a result. Over the past 12 months,??he??has been seen in the??ED >10 times, mostly for exacerbations. No acute exacerbation of cough or increased sputum production during this admission. Continued home azithro MWF, AC: metanebs, beudsonide, Brovana, Duonebs 3% HTS, and continued home Trilogy. Patient received his cough assist prior to discharge. He will follow with Dr. Garner Nash on 05/25/18.  ??  Bipolar 1 Disorder, PTSD: Has followed [facility] Psychiat[facility] and undergone psychotherapy previously.??Continued home Celexa, Valproate, and Mirtazapine  ??  Hypothryoidism:  Continued home synthroid  ??  Acne:  Continued home minocycline    Post Discharge Follow Up Issues:   1) Follow up pathology results of duodenal biopsy  2) Consider repeat manometry outpatient    Procedures: EGD with dilatation and duodenal biopsy   _____________________________________________________________________________  Discharge Day Services:  BP 134/93  - Pulse 108  - Temp 36.8 ??C (Oral)  - Resp 17  - Ht 167.6 cm (5' 5.98)  - Wt 97.8 kg (215 lb 9.8 oz)  - SpO2 95%  - BMI 34.82 kg/m??   Pt seen on the day of discharge and determined appropriate for discharge.    Condition at Discharge: good    Length of Discharge: I spent greater than 30 mins in the discharge of this patient.  _____________________________________________________________________________  Discharge Medications:     Your Medication List      CHANGE how you take these medications    diclofenac sodium 1 % gel  Commonly known as:  VOLTAREN  Apply 2 g topically Four (4) times a  day.  What changed:    ?? when to take this  ?? reasons to take this     promethazine 6.25 mg/5 mL syrup  Commonly known as:  PHENERGAN  Take 10 mL (12.5 mg total) by mouth 4 (four) times a day as needed for nausea. for up to 7 days  What changed:  how much to take     VIRTUSSIN AC 10-100 mg/5 mL liquid Generic drug:  codeine-guaifenesin  Take 5 mL by mouth Three (3) times a day as needed.  What changed:  Another medication with the same name was added. Make sure you understand how and when to take each.     codeine-guaifenesin 6.3-100 mg/5 mL Liqd  Take by mouth Every four (4) hours. for 5 days  What changed:  You were already taking a medication with the same name, and this prescription was added. Make sure you understand how and when to take each.     codeine-guaifenesin 10-100 mg/5 mL liquid  Commonly known as:  GUAIFENESIN AC  Take 5 mL by mouth Three (3) times a day as needed for cough. for up to 5 days  What changed:  You were already taking a medication with the same name, and this prescription was added. Make sure you understand how and when to take each.        CONTINUE taking these medications    acetaminophen 160 mg/5 mL (5 mL) suspension  Commonly known as:  TYLENOL  15.6 mL (500 mg total) by G-tube route every four (4) hours as needed for pain.     albuterol 90 mcg/actuation inhaler  Commonly known as:  PROAIR HFA  Inhale 2 puffs every six (6) hours as needed for wheezing.     arformoterol  Commonly known as:  BROVANA  Inhale 2 mL (15 mcg total) by nebulization Two (2) times a day.     azithromycin 500 MG tablet  Commonly known as:  ZITHROMAX  1 tablet (500 mg total) by G-tube route 3 (three) times a week.     budesonide 0.5 mg/2 mL nebulizer solution  Commonly known as:  PULMICORT  Inhale 4 mL (1 mg total) by nebulization Two (2) times a day.     budesonide-formoterol 160-4.5 mcg/actuation inhaler  Commonly known as:  SYMBICORT  Inhale 2 puffs Two (2) times a day.     cetirizine 10 MG tablet  Commonly known as:  ZyrTEC  1 tablet (10 mg total) by G-tube route every evening.     cholecalciferol (vitamin D3) 2,000 unit Cap  Commonly known as:  VITAMIN D3  3 capsules (6,000 Units total) by G-tube route daily.     citalopram 40 MG tablet  Commonly known as:  CeleXA  1 tablet (40 mg total) by G-tube route nightly.     clindamycin 1 % gel  Commonly known as:  CLINDAGEL  Apply topically Two (2) times a day.     dicyclomine 10 mg capsule  Commonly known as:  BENTYL  10 mg by G-tube route 4 (four) times a day as needed.     fluticasone propionate 50 mcg/actuation nasal spray  Commonly known as:  FLONASE  2 sprays by Each Nare route daily.     gabapentin 300 MG capsule  Commonly known as:  NEURONTIN  300 mg by G-tube route Three (3) times a day.     ibuprofen 600 MG tablet  Commonly known as:  ADVIL,MOTRIN  1 tablet (600 mg total) by G-tube  route every six (6) hours as needed for pain.     ipratropium-albuterol 0.5-2.5 mg/3 mL nebulizer  Commonly known as:  DUO-NEB  Inhale 3 mL 4 (four) times a day.     levothyroxine 75 MCG tablet  Commonly known as:  SYNTHROID, LEVOTHROID  1 tablet (75 mcg total) by G-tube route daily.     linaclotide 290 mcg capsule  Commonly known as:  LINZESS  1 capsule (290 mcg total) by G-tube route daily.     LORazepam 0.5 MG tablet  Commonly known as:  ATIVAN  Take 1 tablet (0.5mg ) twice daily and 1 tablet (0.5mg ) daily as needed for anxiety via G-tube     minocycline 100 MG capsule  Commonly known as:  MINOCIN,DYNACIN  1 capsule (100 mg total) by G-tube route nightly.     mirtazapine 15 MG tablet  Commonly known as:  REMERON  1 tablet (15 mg total) by G-tube route nightly.     montelukast 10 mg tablet  Commonly known as:  SINGULAIR  Take 1 tablet (10 mg total) by mouth nightly.     omeprazole 20 MG capsule  Commonly known as:  PriLOSEC  20 mg Two (2) times a day (30 minutes before a meal). Via G-tube     oxyCODONE 5 MG immediate release tablet  Commonly known as:  ROXICODONE  5 mg by G-tube route every four (4) hours as needed for pain.     PARI LC D NEBULIZER Misc  Generic drug:  nebulizers  Provide 1 device  for use with inhaled medication.     polyethylene glycol 17 gram packet  Commonly known as:  MIRALAX  17 g by G-tube route daily as needed.     sodium chloride 3 % nebulizer solution Inhale 4 mL by nebulization 4 (four) times a day.     tiZANidine 4 MG tablet  Commonly known as:  ZANAFLEX  Take 4 mg by mouth every twelve (12) hours as needed.     tobramycin (PF) 300 mg/5 mL nebulizer solution  Commonly known as:  TOBI  Inhale 5 mL (300 mg total) by nebulization every twelve (12) hours. Every other month     triamcinolone 0.1 % cream  Commonly known as:  KENALOG  Apply topically Two (2) times a day.     valproate 250 mg/5 mL syrup  Commonly known as:  DEPAKENE  TAKE BY G-TUBE EVERY MORNING AND EVERY EVENING          _____________________________________________________________________________  Pending Test Results (if blank, then none):   Order Current Status    ED Extra Tubes In process    PINK EXTRA TUBE In process          Most Recent Labs:  Microbiology Results (last day)     ** No results found for the last 24 hours. **          Lab Results   Component Value Date    WBC 8.4 05/21/2018    HGB 12.4 (L) 05/21/2018    HCT 38.4 (L) 05/21/2018    PLT 423 05/21/2018       Lab Results   Component Value Date    NA 139 05/21/2018    K 4.2 05/21/2018    CL 102 05/21/2018    CO2 25.0 05/21/2018    BUN 10 05/21/2018    CREATININE 0.71 05/21/2018    CALCIUM 9.9 05/21/2018    MG 1.7 05/11/2018    PHOS 4.1 03/29/2018  Lab Results   Component Value Date    ALKPHOS 51 05/20/2018    BILITOT 0.4 05/20/2018    BILIDIR 0.70 (H) 03/18/2017    PROT 7.7 05/20/2018    ALBUMIN 4.9 05/20/2018    ALT 36 05/20/2018    AST 35 05/20/2018       Lab Results   Component Value Date    PT 11.1 05/03/2018    INR 0.97 05/03/2018     Hospital Radiology:  No results found.    _____________________________________________________________________________  Discharge Instructions:     Follow Up instructions and Outpatient Referrals     Discharge instructions      Mr. Shiffman, you were seen for inability to swallow liquid and solids. You had a successful endoscopy with dilatation of the stenotic areas in your oesophagus. Please take your medications as prescribed and follow up with family medicine on 6/17, Dr. Garner Nash on 6/18 and Dr. Wilford Corner on 7/29. Please come back to the hospital if you develop fever/chills, shortness of breath, increased sputum production or inability to swallow liquid and solids/feeling that food is getting stuck in your esophagus.             Appointments which have been scheduled for you    May 24, 2018  4:05 PM EDT  (Arrive by 3:45 PM)  HOSPITAL FOLLOW UP RET FM with Nyra Jabs Daleen Bo, MD  [facility] FAMILY MEDICINE [place] [facility] REGION) [address]  [place] [address]  [phone]      May 25, 2018  8:30 AM EDT  (Arrive by 8:15 AM)  RETURN PFT 15 with [place] PFT 3  [facility] PULMONARY SPECIALTY FUNCT [place] [facility] REGION) [address]  [address]  [place] [address]  [phone]      May 25, 2018  9:00 AM EDT  (Arrive by 8:45 AM)  RETURN  GENERAL with Truett Mainland, MD  [facility] PULMONARY SPECIALTY CL [place] [facility] REGION) [address]  [address]  [place] [address]  [phone]      Jun 29, 2018 12:00 PM EDT  (Arrive by 11:45 AM)  RETURN PFT 15 with [place] PFT 4  [facility] PULMONARY SPECIALTY FUNCT [place] [facility] REGION) [address]  [address]  [place] [address]  [phone]      Jun 29, 2018 12:30 PM EDT  (Arrive by 12:15 PM)  RETURN BRONCHIECTSIS with Truett Mainland, MD  [facility] PULMONARY SPECIALTY CL [place] [facility] REGION) [address]  [address]  [place] [address]  [phone]      Jul 05, 2018 10:20 AM EDT  (Arrive by 9:50 AM)  RETURN  GENERAL with Liane Comber, MD  [facility]IC[facility] [place] [facility] REGION) [address]  [place] [address]  [phone]             Teaching Attending Attestation       I saw and evaluated the patient, participating in the key portions of the service on the day of discharge.  I reviewed the resident's note and agree with the discharge plans and disposition. I personally spent less than 30 minutes in discharge planning services.    Harrel Carina, MD  Pulmonary/Critical Care Medicine  Pediatric Pulmonology

## 2018-05-21 NOTE — Unmapped (Signed)
Patient vss throughout shift. Pt appeared to have slept well. Denies pain/respiratory difficulties. Pt tolerated regular diet. Possibility of d/c today. Will CTM.      Problem: Adult Inpatient Plan of Care  Goal: Plan of Care Review  Outcome: Progressing  Goal: Patient-Specific Goal (Individualization)  Outcome: Progressing  Goal: Absence of Hospital-Acquired Illness or Injury  Outcome: Progressing  Intervention: Prevent Skin Injury  Flowsheets (Taken 05/21/2018 0326)  Pressure Reduction Techniques: frequent weight shift encouraged;heels elevated off bed  Goal: Optimal Comfort and Wellbeing  Outcome: Progressing  Intervention: Provide Person-Centered Care  Flowsheets (Taken 05/21/2018 0326)  Trust Relationship/Rapport: questions answered  Goal: Readiness for Transition of Care  Outcome: Progressing  Goal: Rounds/Family Conference  Outcome: Progressing     Problem: Fall Injury Risk  Goal: Absence of Fall and Fall-Related Injury  Outcome: Progressing  Intervention: Promote Merchandiser, retail (Taken 05/20/2018 2000)  Safety Interventions: aspiration precautions     Problem: Venous Thromboembolism  Goal: VTE (Venous Thromboembolism) Symptom Resolution  Outcome: Progressing  Intervention: Prevent or Manage VTE (Venous Thromboembolism)  Flowsheets (Taken 05/20/2018 1800 by Karl Luke, RN)  Anti-Embolism Intervention: Refused     Problem: Swallowing Impairment  Goal: Improved Swallowing Without Aspiration  Outcome: Progressing  Intervention: Optimize Eating and Swallowing  Flowsheets  Taken 05/20/2018 2000  Aspiration Precautions: awake/alert before oral intake  Taken 05/21/2018 0326  Swallowing Techniques: Dysphagia: appropriate positioning encouraged

## 2018-05-21 NOTE — Unmapped (Signed)
Care Management  Initial Transition Planning Assessment      Per H&P:  Cameron Baker is a 27 y.o. male with PMHx of hypothyroidism, asthma, mood d/o and VATER syndrome s/p trach/peg w/ frequent admissions for tracheobronchitis that presented to [facility] with c/f inability to manage secretions prior to scheduled outpatient esophageal dilation.      General  Care Manager assessed the patient by : Medical record review, Discussion with Clinical Care team  Orientation Level: Oriented X4    Contact/Decision Maker        Extended Emergency Contact Information  Primary Emergency Contact: [name] [place]  Home Phone: [phone]  Mobile Phone: [phone]  Relation: Mother    Legal Next of Kin / Guardian / POA / Advance Directives       Advance Directive (Medical Treatment)  Does patient have an advance directive covering medical treatment?: Patient would not like information.  Reason there is not a Health Care Decision Maker appointed:: Patient does not wish to appoint a Health Care Decision Maker at this time  Information provided on advance directive:: No  Patient requests assistance:: No         Patient Information  Lives with: Friends    Type of Residence: Private residence        Location/Detail: friend's house 1st flr apt     Type of Residence: Mailing Address:  [address]  [place] [address]  Contacts:    Patient Phone Number:[phone] (home) [phone] (work)          Medical Provider(s): Ander Slade, MD  Reason for Admission: Admitting Diagnosis:  Dysphagia, unspecified type [R13.10]  Past Medical History:   has a past medical history of ADHD, Asthma, Bipolar 1 disorder (CMS-HCC), Chronic tracheobronchitis (CMS-HCC), Constipation, De Quervain's tenosynovitis, Dysphagia, Esophageal dysmotility, Esophageal stricture, GERD (gastroesophageal reflux disease), hypothyroid, Seizures (CMS-HCC), Tracheobronchomalacia, VATER syndrome, and Ventilator dependence (CMS-HCC).  Past Surgical History:   has a past surgical history that includes Tracheostomy (1994); Hip surgery (Right, 01/19/2017); Wrist surgery; Nissen fundoplication (2001); Anal dilation (01/21/2006); Portacath placement (01/21/2006); REMOVAL VENOUS ACCESS PORT [facility] HISTORICAL RESULT) (11/03/2006); Wisdom tooth extraction (04/2012); Lateral rectus recession (11/15/2003); Strabismus surgery (Right); Esophagogastroduodenoscopy; pr up gi endoscopy,ball dil,31mm (N/A, 02/22/2018); and pr esophageal motility study, manometry (N/A, 04/23/2018).   Previous admit date: 05/03/2018    Primary Insurance- Payor: Advertising copywriter MEDICARE ADV / Plan: UNITED HEALTHCARE DUAL COMPLETE RP / Product Type: *No Product type* /   Secondary Insurance ??? Secondary Insurance  MEDICAID [place]  Prescription Coverage ??? Yes  Preferred Pharmacy - [id number] - [place], [place] - [address] AT [facility] [address]  [facility] [phone] - [place], [place] - [address]  [facility] - [place], [place] - [address]    Transportation home: Private vehicle  Level of function prior to admission: Independent          Support Systems: Family Members, Friends/Neighbors    Responsibilities/Dependents at home?: No    Home Care services in place prior to admission?: Yes  Type of Home Care services in place prior to admission: Home health (specify)  Current Home Care provider (Name/Phone #): [facility] ([phone], F: [phone]). Pt reports he has private duty nursing Wed-Sat 7pm-7am; Sunday 11pm-7am with no nursing Monday or Tuesday because that's when he goes out  Current HME Agency (Name/Phone #): [employer] for vent and chest vest, pt was waiting on New Trilogy and cough assist at previous d/c.     Currently receiving outpatient dialysis?: No       Financial Information       Need for financial assistance?: No       Social Determinants of Health  Social Determinants of Health were addressed in provider documentation.  Please refer to patient history.    Discharge Needs Assessment  Concerns to be Addressed: denies needs/concerns at this time    Clinical Risk Factors: Multiple Diagnoses (Chronic), Functional Limitations, Lives Alone or Absence of Caregiver to Assist with Discharge and Home Care         Prior overnight hospital stay or ED visit in last 90 days: Yes    Readmission Within the Last 30 Days: previous discharge plan unsuccessful         Anticipated Changes Related to Illness: none         Discharge Facility/Level of Care Needs: acute care hospital(Home to resume PDN)    Readmission  Risk of Unplanned Readmission Score:  %  Readmitted Within the Last 30 Days?   Patient at risk for readmission?: Yes    Discharge Plan  Screen findings are: Discharge planning needs identified or anticipated (Comment).    Expected Discharge Date:      Expected Transfer from Critical Care: 05/22/18    Patient and/or family were provided with choice of facilities / services that are available and appropriate to meet post hospital care needs?: Yes   List choices in order highest to lowest preferred, if applicable. : Continue with [facility] for PDN    Initial Assessment complete?: Yes

## 2018-05-22 ENCOUNTER — Ambulatory Visit: Admit: 2018-05-22 | Discharge: 2018-05-28 | Disposition: A | Discharge status: Home Health | Payer: MEDICARE

## 2018-05-22 ENCOUNTER — Encounter
Admit: 2018-05-22 | Discharge: 2018-05-28 | Disposition: A | Discharge status: Home Health | Payer: MEDICARE | Attending: Certified Registered"

## 2018-05-22 DIAGNOSIS — R0602 Shortness of breath: Principal | ICD-10-CM

## 2018-05-22 LAB — COMPREHENSIVE METABOLIC PANEL
ALBUMIN: 4.5 g/dL (ref 3.5–5.0)
ALKALINE PHOSPHATASE: 49 U/L (ref 38–126)
ANION GAP: 11 mmol/L (ref 9–15)
AST (SGOT): 29 U/L (ref 19–55)
BILIRUBIN TOTAL: 0.7 mg/dL (ref 0.0–1.2)
BLOOD UREA NITROGEN: 10 mg/dL (ref 7–21)
BUN / CREAT RATIO: 14
CALCIUM: 9.3 mg/dL (ref 8.5–10.2)
CHLORIDE: 101 mmol/L (ref 98–107)
CO2: 26 mmol/L (ref 22.0–30.0)
EGFR MDRD AF AMER: >=60 mL/min/{1.73_m2} (ref >=60)
EGFR MDRD NON AF AMER: >=60 mL/min/{1.73_m2} (ref >=60)
GLUCOSE RANDOM: 87 mg/dL (ref 65–179)
POTASSIUM: 4.2 mmol/L (ref 3.5–5.0)
PROTEIN TOTAL: 7.2 g/dL (ref 6.5–8.3)
SODIUM: 138 mmol/L (ref 135–145)

## 2018-05-22 LAB — CBC W/ AUTO DIFF
BASOPHILS ABSOLUTE COUNT: 0 10*9/L (ref 0.0–0.1)
BASOPHILS RELATIVE PERCENT: 0.4 %
EOSINOPHILS ABSOLUTE COUNT: 0.1 10*9/L (ref 0.0–0.4)
EOSINOPHILS RELATIVE PERCENT: 1.3 %
HEMATOCRIT: 36.5 % — ABNORMAL LOW (ref 41.0–53.0)
HEMOGLOBIN: 12.2 g/dL — ABNORMAL LOW (ref 13.5–17.5)
LARGE UNSTAINED CELLS: 2 % (ref 0–4)
LYMPHOCYTES ABSOLUTE COUNT: 1.9 10*9/L (ref 1.5–5.0)
MEAN CORPUSCULAR HEMOGLOBIN CONC: 33.4 g/dL (ref 31.0–37.0)
MEAN CORPUSCULAR HEMOGLOBIN: 25.4 pg — ABNORMAL LOW (ref 26.0–34.0)
MEAN CORPUSCULAR VOLUME: 75.9 fL — ABNORMAL LOW (ref 80.0–100.0)
MONOCYTES RELATIVE PERCENT: 6.7 %
NEUTROPHILS ABSOLUTE COUNT: 7.4 10*9/L (ref 2.0–7.5)
NEUTROPHILS RELATIVE PERCENT: 71.2 %
PLATELET COUNT: 441 10*9/L — ABNORMAL HIGH (ref 150–440)
RED BLOOD CELL COUNT: 4.8 10*12/L (ref 4.50–5.90)
RED CELL DISTRIBUTION WIDTH: 15.4 % — ABNORMAL HIGH (ref 12.0–15.0)
WBC ADJUSTED: 10.3 10*9/L (ref 4.5–11.0)

## 2018-05-22 LAB — BLOOD GAS CRITICAL CARE PANEL, VENOUS
BASE EXCESS VENOUS: 3.7 — ABNORMAL HIGH (ref -2.0–2.0)
GLUCOSE WHOLE BLOOD: 95 mg/dL
HCO3 VENOUS: 28 mmol/L — ABNORMAL HIGH (ref 22–27)
HEMOGLOBIN BLOOD GAS: 12.1 g/dL — ABNORMAL LOW (ref 13.50–17.50)
LACTATE BLOOD VENOUS: 0.8 mmol/L (ref 0.5–1.8)
O2 SATURATION VENOUS: 90.5 % — ABNORMAL HIGH (ref 40.0–85.0)
PH VENOUS: 7.38 (ref 7.32–7.43)
PO2 VENOUS: 60 mmHg — ABNORMAL HIGH (ref 30–55)
POTASSIUM WHOLE BLOOD: 3.7 mmol/L (ref 3.4–4.6)
SODIUM WHOLE BLOOD: 138 mmol/L (ref 135–145)

## 2018-05-22 LAB — ALBUMIN: Albumin:MCnc:Pt:Ser/Plas:Qn:: 4.5

## 2018-05-22 LAB — LIPASE
LIPASE: 567 U/L — ABNORMAL HIGH (ref 44–232)
Triacylglycerol lipase:CCnc:Pt:Ser/Plas:Qn:: 567 — ABNORMAL HIGH

## 2018-05-22 LAB — LACTATE BLOOD VENOUS: Lactate:SCnc:Pt:BldV:Qn:: 0.8

## 2018-05-22 LAB — MEAN PLATELET VOLUME: Lab: 7.1

## 2018-05-22 NOTE — Unmapped (Addendum)
Cameron Baker is a 27 y.o. male with history of VATER syndrome s/p trach/peg w/ frequent admissions for tracheobronchitis, hypothyroidism, asthma, mood d/o who presented to [facility] on 05/20/18 with progressive dysphagia to solids and liquids.    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 Follows with Dr. Wilford Corner [facility] Gastroenterology). Patient had  progressive dysphagia to solids and liquids of 2-weeks duration that got worse the previous night. GI was consulted. Patient had EGD with balloon dilation on the same day. He was found to have two benign appearing esophageal stenosis, which were dilated. Patient was also noted to have scalloped mucosa in the duodenum, suspicious for celiac disease, which was biopsied.  Patient stayed overnight and had no procedural complications. His medications, including Protonix were administered through his G-tube and he resumed normal diet the same day, tolerating both solids and liquids. Pathology results are pending. Patient will follow up with his gastroenterologist, Dr. Wilford Corner, for possible repeat manometry outpatient.   ??  Chronic Tracheobronchitis: VATER Syndrome-associated??tracheobronchomalacia with tracheostomy placement and nocturnal mechanical ventilation. He experiences repeated episodes of tracheobronchitis as a result. Over the past 12 months,??he??has been seen in the??ED >10 times, mostly for exacerbations. No acute exacerbation of cough or increased sputum production during this admission. Continued home azithro MWF, AC: metanebs, beudsonide, Brovana, Duonebs 3% HTS, and continued home Trilogy. Patient received his cough assist prior to discharge. He will follow with Dr. Garner Nash on 05/25/18.  ??  Bipolar 1 Disorder, PTSD: Has followed [facility] Psychiatry and undergone psychotherapy previously.??Continued home Celexa, Valproate, and Mirtazapine  ??  Hypothryoidism: Continued home synthroid  ??  Acne: Continued home minocycline

## 2018-05-22 NOTE — Unmapped (Signed)
Patient transported to X-ray  Transported by Radiology  How tranported Stretcher  Cardiac Monitor yes

## 2018-05-22 NOTE — Unmapped (Signed)
[facility]  Emergency Department Provider Note      ED Clinical Impression     Final diagnoses:   Shortness of breath (Primary)       Initial Impression, ED Course, Assessment and Plan     Impression: Patient is a 27 y.o. male with a history of Vater syndrome, dysphasia recently status post esophageal stricture dilatation, status post tracheostomy with 6.0 Bivona trach, status post G-tube, and asthma who presents the ER due to productive cough and shortness of breath.  Exam patient is alert oriented x3, no acute distress, with vital signs within normal limits.  Patient's pulmonary exam reveals bilateral rhonchi at the bases.  Concern for pneumonia versus aspiration pneumonia versus aspiration pneumonitis.  Will obtain chest x-ray and labs for further evaluation.  Will treat patient's pain with morphine and reassess once imaging and labs complete.    May 22, 2018 6:21 PM  Patient's labs demonstrate elevated lipase to 500 however patient denies any abdominal pain or vomiting.  He continues to endorse shortness of breath as well as productive cough.  Patient's chest x-ray shows possible aspiration pneumonia therefore will order Unasyn now.  Plan for admission to general medicine team for continued treatment of aspiration pneumonia and monitoring.  Medicine U team paged.  Will sign out patient's ER care to oncoming ER resident at 7 PM.        ____________________________________________    Time seen: May 22, 2018 3:26 PM    I have reviewed the triage vital signs and the nursing notes.     History     Chief Complaint  Shortness of Breath      HPI   Cameron Baker is a 27 y.o. male with a history of Vater syndrome, dysphasia recently status post esophageal stricture dilatation, status post tracheostomy with 6.0 Bivona trach, status post G-tube, and asthma who presents the ER due to productive cough and shortness of breath.  Patient reports that he was discharged home proximally 24 hours ago after having an esophageal dilatation performed to remove strictures.  Patient says he woke up this morning with increased shortness of breath, rattling when he breathes, and productive cough.  Patient is concerned that he may have aspirated because these are similar symptoms that he is having the past with aspiration events.  He denies any measured fever but does report subjective fevers.  He does endorse some right-sided chest soreness but denies any sharp pleuritic chest pain.  He denies any swelling in his bilateral lower extremity's or pain in his calves.          Past Medical History:   Diagnosis Date   ??? ADHD    ??? Asthma    ??? Bipolar 1 disorder (CMS-HCC)    ??? Chronic tracheobronchitis (CMS-HCC)    ??? Constipation    ??? De Quervain's tenosynovitis    ??? Dysphagia    ??? Esophageal dysmotility    ??? Esophageal stricture    ??? GERD (gastroesophageal reflux disease)    ??? hypothyroid    ??? Seizures (CMS-HCC)    ??? Tracheobronchomalacia    ??? VATER syndrome    ??? Ventilator dependence (CMS-HCC)        Patient Active Problem List   Diagnosis   ??? Asthma   ??? Attention deficit disorder   ??? Slow transit constipation   ??? Dysphagia   ??? Esophageal reflux   ??? Hypothyroidism   ??? Class 1 obesity in adult   ??? Seizures (  CMS-HCC)   ??? Gastrostomy status (CMS-HCC)   ??? Stricture and stenosis of esophagus   ??? Post traumatic stress disorder (PTSD)   ??? Mood disorder (CMS-HCC)   ??? Personality disorder (CMS-HCC)   ??? Chronic tracheobronchitis (CMS-HCC)   ??? Congenital tracheomalacia   ??? Tracheostomy dependence (CMS-HCC)   ??? VATER syndrome   ??? Muscle spasms of both lower extremities   ??? Pain and swelling of right wrist   ??? Hypogammaglobulinemia (CMS-HCC)   ??? Tracheobronchitis   ??? Coronavirus infection   ??? Triangular fibrocartilage complex injury, left, subsequent encounter   ??? Enthesopathy of right hip region   ??? Dyspnea       Past Surgical History:   Procedure Laterality Date   ??? ANAL DILATION  01/21/2006   ??? ESOPHAGOGASTRODUODENOSCOPY      multiple   ??? HIP SURGERY Right 01/19/2017    shaved   ??? LATERAL RECTUS RECESSION  11/15/2003   ??? NISSEN FUNDOPLICATION  2001    x 2 redos   ??? PORTACATH PLACEMENT  01/21/2006    taken out same year   ??? PR ESOPHAGEAL MOTILITY STUDY, MANOMETRY N/A 04/23/2018    Procedure: ESOPHAGEAL MOTILITY STUDY W/INT & REP;  Surgeon: Nurse-Based Giproc;  [facility];  Service: Gastroenterology   ??? PR UP GI ENDOSCOPY,BALL DIL,30MM N/A 02/22/2018    Procedure: UGI ENDO; W/BALLOON DILAT ESOPHAGUS (<30MM DIAM);  Surgeon: Zetta Bills, MD;  Location: [facility];  Service: Gastroenterology   ??? PR UP GI ENDOSCOPY,BALL DIL,30MM N/A 05/20/2018    Procedure: UGI ENDO; W/BALLOON DILAT ESOPHAGUS (<30MM DIAM);  Surgeon: Rona Ravens, MD;  Location: [facility];  Service: Gastroenterology   ??? REMOVAL VENOUS ACCESS PORT [facility] HISTORICAL RESULT)  11/03/2006   ??? STRABISMUS SURGERY Right    ??? TRACHEOSTOMY  1994   ??? WISDOM TOOTH EXTRACTION  04/2012   ??? WRIST SURGERY      3 times.          Current Facility-Administered Medications:   ???  MORPhine 4 mg/mL injection 4 mg, 4 mg, Intravenous, Once, Sherolyn Buba, MD    Current Outpatient Medications:   ???  acetaminophen (TYLENOL) 160 mg/5 mL (5 mL) suspension, 15.6 mL (500 mg total) by G-tube route every four (4) hours as needed for pain., Disp: 1 Bottle, Rfl: 0  ???  albuterol HFA 90 mcg/actuation inhaler, Inhale 2 puffs every six (6) hours as needed for wheezing., Disp: 1 Inhaler, Rfl: 11  ???  arformoterol (BROVANA), Inhale 2 mL (15 mcg total) by nebulization Two (2) times a day., Disp: 120 mL, Rfl: 11  ???  azithromycin (ZITHROMAX) 500 MG tablet, 1 tablet (500 mg total) by G-tube route 3 (three) times a week., Disp: 12 tablet, Rfl: 11  ???  budesonide (PULMICORT) 0.5 mg/2 mL nebulizer solution, Inhale 4 mL (1 mg total) by nebulization Two (2) times a day., Disp: 240 mL, Rfl: 11  ???  budesonide-formoterol (SYMBICORT) 160-4.5 mcg/actuation inhaler, Inhale 2 puffs Two (2) times a day., Disp: 1 Inhaler, Rfl: 11  ???  cetirizine (ZYRTEC) 10 MG tablet, 1 tablet (10 mg total) by G-tube route every evening., Disp: , Rfl: 0  ???  cholecalciferol, vitamin D3, (VITAMIN D3) 2,000 unit cap, 3 capsules (6,000 Units total) by G-tube route daily., Disp: , Rfl: 0  ???  citalopram (CELEXA) 40 MG tablet, 1 tablet (40 mg total) by G-tube route nightly., Disp: 30 tablet, Rfl: 2  ???  clindamycin (  CLINDAGEL) 1 % gel, Apply topically Two (2) times a day., Disp: 60 g, Rfl: 6  ???  codeine-guaifenesin (GUAIFENESIN AC) 10-100 mg/5 mL liquid, Take 5 mL by mouth Three (3) times a day as needed for cough. for up to 5 days, Disp: 118 mL, Rfl: 0  ???  codeine-guaifenesin 6.3-100 mg/5 mL Liqd, Take by mouth Every four (4) hours. for 5 days, Disp: 473 mL, Rfl: 0  ???  diclofenac sodium (VOLTAREN) 1 % gel, Apply 2 g topically Four (4) times a day. (Patient taking differently: Apply 2 g topically 4 (four) times a day as needed. ), Disp: 100 g, Rfl: 6  ???  dicyclomine (BENTYL) 10 mg capsule, 10 mg by G-tube route 4 (four) times a day as needed. , Disp: , Rfl:   ???  fluticasone (FLONASE) 50 mcg/actuation nasal spray, 2 sprays by Each Nare route daily. , Disp: , Rfl:   ???  gabapentin (NEURONTIN) 300 MG capsule, 300 mg by G-tube route Three (3) times a day., Disp: , Rfl:   ???  ibuprofen (ADVIL,MOTRIN) 600 MG tablet, 1 tablet (600 mg total) by G-tube route every six (6) hours as needed for pain., Disp: 30 tablet, Rfl: 1  ???  ipratropium-albuterol (DUO-NEB) 0.5-2.5 mg/3 mL nebulizer, Inhale 3 mL 4 (four) times a day. , Disp: , Rfl:   ???  levothyroxine (SYNTHROID, LEVOTHROID) 75 MCG tablet, 1 tablet (75 mcg total) by G-tube route daily., Disp: 90 tablet, Rfl: 3  ???  linaclotide (LINZESS) 290 mcg capsule, 1 capsule (290 mcg total) by G-tube route daily., Disp: 30 capsule, Rfl: 0  ???  LORazepam (ATIVAN) 0.5 MG tablet, Take 1 tablet (0.5mg ) twice daily and 1 tablet (0.5mg ) daily as needed for anxiety via G-tube, Disp: 70 tablet, Rfl: 2  ??? minocycline (MINOCIN,DYNACIN) 100 MG capsule, 1 capsule (100 mg total) by G-tube route nightly., Disp: 90 capsule, Rfl: 3  ???  mirtazapine (REMERON) 15 MG tablet, 1 tablet (15 mg total) by G-tube route nightly., Disp: 30 tablet, Rfl: 2  ???  montelukast (SINGULAIR) 10 mg tablet, Take 1 tablet (10 mg total) by mouth nightly., Disp: 30 tablet, Rfl: 11  ???  omeprazole (PRILOSEC) 20 MG capsule, 20 mg Two (2) times a day (30 minutes before a meal). Via G-tube, Disp: , Rfl:   ???  oxyCODONE (ROXICODONE) 5 MG immediate release tablet, 5 mg by G-tube route every four (4) hours as needed for pain. , Disp: , Rfl:   ???  PARI LC D NEBULIZER Misc, Provide 1 device  for use with inhaled medication., Disp: 2 each, Rfl: 11  ???  polyethylene glycol (MIRALAX) 17 gram packet, 17 g by G-tube route daily as needed., Disp: 20 packet, Rfl: 0  ???  promethazine (PHENERGAN) 6.25 mg/5 mL syrup, Take 10 mL (12.5 mg total) by mouth 4 (four) times a day as needed for nausea. for up to 7 days, Disp: 120 mL, Rfl: 0  ???  sodium chloride 3 % nebulizer solution, Inhale 4 mL by nebulization 4 (four) times a day., Disp: 480 mL, Rfl: 1  ???  tiZANidine (ZANAFLEX) 4 MG tablet, Take 4 mg by mouth every twelve (12) hours as needed., Disp: , Rfl:   ???  tobramycin, PF, (TOBI) 300 mg/5 mL nebulizer solution, Inhale 5 mL (300 mg total) by nebulization every twelve (12) hours. Every other month, Disp: 280 mL, Rfl: 5  ???  triamcinolone (KENALOG) 0.1 % cream, Apply topically Two (2) times a day., Disp: 15 g,  Rfl: 11  ???  valproate (DEPAKENE) 250 mg/5 mL syrup, TAKE BY G-TUBE EVERY MORNING AND EVERY EVENING, Disp: 1850 mL, Rfl: 0  ???  VIRTUSSIN AC 10-100 mg/5 mL liquid, Take 5 mL by mouth Three (3) times a day as needed., Disp: , Rfl: 0    Allergies  Xopenex [levalbuterol hcl]; Allopurinol analogues; and Versed [midazolam]    Family History   Adopted: Yes       Social History  Social History     Tobacco Use   ??? Smoking status: Never Smoker   ??? Smokeless tobacco: Never Used   Substance Use Topics   ??? Alcohol use: Yes     Alcohol/week: 1.2 oz     Types: 2 Cans of beer per week     Comment: on weekends sometimes   ??? Drug use: No       Review of Systems     Constitutional: Negative for fever.  Eyes: Negative for visual changes.  ENT: Negative for sore throat.  Cardiovascular: Negative for chest pain.  Respiratory: Negative for shortness of breath.  Gastrointestinal: Negative for abdominal pain, vomiting or diarrhea.  Genitourinary: Negative for dysuria.  Musculoskeletal: Negative for back pain.  Skin: Negative for rash.  Neurological: Negative for headaches, focal weakness or numbness.      Physical Exam     VITAL SIGNS:    ED Triage Vitals [05/22/18 1429]   Enc Vitals Group      BP 143/88      Heart Rate 100      SpO2 Pulse       Resp 20      Temp 36.6 ??C (97.9 ??F)      Temp Source Temporal      SpO2 98 %      Weight       Height       Head Circumference       Peak Flow       Pain Score       Pain Loc       Pain Edu?       Excl. in GC?          Constitutional: Alert and oriented. Well appearing and in no distress.  Eyes: Conjunctivae are normal.  ENT       Head: Normocephalic and atraumatic.       Nose: No congestion.       Mouth/Throat: Mucous membranes are moist.       Neck: No stridor.  Hematological/Lymphatic/Immunilogical: No cervical lymphadenopathy.  Cardiovascular: Normal rate, regular rhythm. Normal and symmetric distal pulses are present in all extremities.  Respiratory: Normal respiratory effort.  Rhonchi noted in the bilateral lung bases.  Otherwise clear to auscultation.  Gastrointestinal: Soft and nontender. There is no CVA tenderness.  Musculoskeletal: Nontender with normal range of motion in all extremities.       Right lower leg: No tenderness or edema.       Left lower leg: No tenderness or edema.  Neurologic: Normal speech and language. No gross focal neurologic deficits are appreciated.  Skin: Skin is warm, dry and intact. No rash noted.  Psychiatric: Mood and affect are normal. Speech and behavior are normal.      EKG         Radiology     Xr Chest 2 Views    Result Date: 05/22/2018  EXAM: CHEST TWO VIEW DATE: 05/22/2018 at 16:42 ACCESSION: [id number] UN DICTATED: 05/22/2018 4:55 PM INTERPRETATION LOCATION:  [facility] CLINICAL INDICATION: 27 years old Male with SHORTNESS OF BREATH  COMPARISON: 05/03/2018 TECHNIQUE:PA and lateral views of the chest. FINDINGS: Tracheostomy cannula is unchanged. Mild hypoinflation with faint bibasilar patchy opacities. No pleural effusions. No pneumothorax. Stable cardiomediastinal silhouette. Surgical clips overlying the left upper quadrant.     --Minimally hypoinflated lungs with faint patchy basilar opacities which could reflect atelectasis or aspiration.        Procedures     Procedure(s) performed: None.    Pertinent labs & imaging results that were available during my care of the patient were reviewed by me and considered in my medical decision making (see chart for details).     Sherolyn Buba, MD  05/22/18 [phone]

## 2018-05-22 NOTE — Unmapped (Signed)
Pt states since last night, having increased thick secretions, lightheaded, SOB

## 2018-05-22 NOTE — Unmapped (Signed)
Patient called and wanted to come to ED to be evaluated for new productive cough. Denies chest pain, SOB, or fever. Had EGD 2 days ago (with esophageal dilatation). He decline a course of PO abx and wanted to come to ED to be evaluated.

## 2018-05-23 LAB — CBC
HEMATOCRIT: 37.4 % — ABNORMAL LOW (ref 41.0–53.0)
MEAN CORPUSCULAR HEMOGLOBIN CONC: 32.6 g/dL (ref 31.0–37.0)
MEAN CORPUSCULAR HEMOGLOBIN: 24.9 pg — ABNORMAL LOW (ref 26.0–34.0)
MEAN CORPUSCULAR VOLUME: 76.3 fL — ABNORMAL LOW (ref 80.0–100.0)
MEAN PLATELET VOLUME: 7 fL (ref 7.0–10.0)
RED BLOOD CELL COUNT: 4.9 10*12/L (ref 4.50–5.90)
RED CELL DISTRIBUTION WIDTH: 15.8 % — ABNORMAL HIGH (ref 12.0–15.0)
WBC ADJUSTED: 9.2 10*9/L (ref 4.5–11.0)

## 2018-05-23 LAB — BASIC METABOLIC PANEL
ANION GAP: 10 mmol/L (ref 9–15)
BUN / CREAT RATIO: 15
CALCIUM: 9.3 mg/dL (ref 8.5–10.2)
CHLORIDE: 101 mmol/L (ref 98–107)
CO2: 28 mmol/L (ref 22.0–30.0)
CREATININE: 0.81 mg/dL (ref 0.70–1.30)
EGFR MDRD NON AF AMER: >=60 mL/min/{1.73_m2} (ref >=60)
GLUCOSE RANDOM: 91 mg/dL (ref 65–99)
POTASSIUM: 4.3 mmol/L (ref 3.5–5.0)
SODIUM: 139 mmol/L (ref 135–145)

## 2018-05-23 LAB — MEAN PLATELET VOLUME: Lab: 7

## 2018-05-23 LAB — CHLORIDE: Chloride:SCnc:Pt:Ser/Plas:Qn:: 101

## 2018-05-23 LAB — LIPASE: Triacylglycerol lipase:CCnc:Pt:Ser/Plas:Qn:: 162
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
## 2018-05-23 NOTE — Unmapped (Signed)
VENOUS ACCESS ULTRASOUND PROCEDURE NOTE    Indications:   Poor venous access.    The Venous Access Team has assessed this patient for the placement of a PIV. Ultrasound guidance was necessary to obtain access.     Procedure Details:  Risks, benefits and alternatives discussed with patient. Identity of the patient was confirmed via name, medical record number and date of birth. The availability of the correct equipment was verified.    The vein was identified for ultrasound catheter insertion.  Field was prepared with necessary supplies and equipment.  Probe cover and sterile gel utilized.  Insertion site was prepped with chlorhexidine solution and allowed to dry.  The catheter extension was primed with normal saline.A(n) 22 g x 1.75 inch catheter was placed in the left forearm with 1 attempt(s).     Catheter aspirated, 3 mL blood return present. The catheter was then flushed with 20 mL of normal saline. Insertion site cleansed, and dressing applied per manufacturer guidelines. The catheter was inserted without difficulty  by Gillie Manners RN.    The primary RN was notified.     Thank you,     Gillie Manners RN Venous Access Team   [phone]     Workup / Procedure Time:  30 minutes    See vein image below:

## 2018-05-23 NOTE — Unmapped (Signed)
Patient rounds completed. The following patient needs were addressed:  Pain, Toileting, Personal Belongings, Plan of Care, Call Bell in Reach and Bed Position Low .

## 2018-05-23 NOTE — Unmapped (Signed)
Patient returned from X-ray  Transported by Radiology  How tranported Stretcher  Cardiac Monitor yes

## 2018-05-23 NOTE — Unmapped (Signed)
A&Ox4. NSR to low ST with BP ranging 130s-140s systolic. Room air. Afebrile. Pt c/o pain in rib cage. Prn oxy given once. Pt also c/o nausea. Prn phenergan given once. Adequate UOP. Last BM 05/21/18 per pt report. Adequate PO intake. Pt requests all meds be put through G tube. G tube site red/scaly around site upon assessment. Pt states site has looked like this for a while. Triamcinolone applied to site this morning per order. Pt walked around nurse's station this afternoon. Tolerated well. Pt independent. Free from falls/injury. ROUNDs/turns q2 hours. Will continue to monitor.     Problem: Adult Inpatient Plan of Care  Goal: Plan of Care Review  Outcome: Progressing  Goal: Patient-Specific Goal (Individualization)  Outcome: Progressing  Goal: Absence of Hospital-Acquired Illness or Injury  Outcome: Progressing  Goal: Optimal Comfort and Wellbeing  Outcome: Progressing  Goal: Readiness for Transition of Care  Outcome: Progressing  Goal: Rounds/Family Conference  Outcome: Progressing     Problem: Obstructive Sleep Apnea Risk or Actual (Comorbidity Management)  Goal: Unobstructed Breathing During Sleep  Outcome: Progressing     Problem: Pain Chronic (Persistent) (Comorbidity Management)  Goal: Acceptable Pain Control and Functional Ability  Outcome: Progressing     Problem: Communication Impairment (Mechanical Ventilation, Invasive)  Goal: Effective Communication  Outcome: Progressing     Problem: Device-Related Complication Risk (Mechanical Ventilation, Invasive)  Goal: Optimal Device Function  Outcome: Progressing     Problem: Skin and Tissue Injury (Mechanical Ventilation, Invasive)  Goal: Absence of Device-Related Skin and Tissue Injury  Outcome: Progressing     Problem: Ventilator-Induced Lung Injury (Mechanical Ventilation, Invasive)  Goal: Absence of Ventilator-Induced Lung Injury  Outcome: Progressing

## 2018-05-23 NOTE — Unmapped (Signed)
Daily Progress Note    Assessment/Plan: Cameron Baker is a 27 y.o. male with history of VATER syndrome s/p trach/peg??w/ frequent admissions for chronic tracheobronchitis, hypothyroidism, asthma, bipolar 1 disorder, recent hospitalization 6/13-6/14 for new type II achalasia s/p esophageal dilation who re-presented to ED for productive cough, SOB, subjective fevers, similar to prior occurrences of aspiration pneumonia.       Active Problems:    Hypothyroidism    Gastrostomy status (CMS-HCC)    Tracheostomy dependence (CMS-HCC)    VATER syndrome    Aspiration pneumonia (CMS-HCC)       LOS: 1 day   ??  ??  Aspiration pneumonia: 1 day hx SOB that feels similar to prior episodes of aspiration PNA, wet cough, malaise, in setting of recent EGD for stricture dilation on 6/13. CXR shows faint patchy basilar opacities, c/f aspiration pneumonia.   -Pulm consulted, appreciate assistance  -Continue Unasyn (6/15- ), plan to transition to Augmentin when able  - f/u up blood cultures and tracheal aspirate culture, lower resp culture  - Can switch to PO augmentin prior to d/c  - continue home inhalers and trilogy vent at night in stepdown (trach dependent status)     Type II Achalasia 2/2 VATER syndrome??s/p recent EGD dilation: ??Follows with Dr. Wilford Corner [facility]), admitted on 6/13 for progressive dysphagia, had EGD found to have esophageal stenosis (s/p balloon dilation on 6/13) and scalloped mucosa, concerning for celiac disease, which was biopsied. Path results are pending. F/u w/ Dr. Wilford Corner for possible repeat manometry outpatient.??  - continue PPI  - f/u pathology results  ??  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 In past year, has been to??ED >10 times for exacerbations. He will follow with Dr. Garner Nash on 05/25/18.  -??Continued home azithro MWF, AC  -??metanebs, beudsonide, Brovana, Duonebs 3% HTS,   -??continued home Trilogy. ??    Elevated lipase, resolving: incidentally found on admission labs, elevated to >500. Had nausea yesterday, no abdominal pain. Repeat lipase from today (6/16) is 162.      Chronic Stable Medical Conditions: ??  Bipolar 1 Disorder, PTSD:??Has followed [facility] and undergone psychotherapy previously.??  -??Continued home Celexa, Valproate, and Mirtazapine  ??  Hypothryoidism:????Continued home synthroid  ??  Acne:????Continued home minocycline    ------------------------------------------------------------------------------------    Subjective:   Patient continues to have dyspnea and cough, feels similar to prior episodes of aspiration.       Objective:     Vital signs in last 24 hours:  Temp:  [36.4 ??C-36.9 ??C] 36.7 ??C  Heart Rate:  [69-106] 106  SpO2 Pulse:  [87-96] 87  Resp:  [8-20] 12  BP: (116-143)/(65-88) 141/83  MAP (mmHg):  [83-102] 101  FiO2 (%):  [28 %] 28 %  SpO2:  [94 %-99 %] 97 %  BMI (Calculated):  [34.82] 34.82    Intake/Output last 3 shifts:  I/O last 3 completed shifts:  In: 1900 [P.O.:600; NG/GT:100; IV Piggyback:1200]  Out: 250 [Urine:250]    Physical Exam:  General: young overweight man with trach, resting comfortably in bed  HEENT: no scleral icterus or conjunctival effusion. No oropharyngeal exudate. No tracheal deviation present.   Pulm:  Normal respiratory effort. Wheezing, rales present bilaterally.    Neuro: A&Ox3, no focal neurological deficits  Psych: appropriate mood and affect.     [name], MS3    I attest that I have reviewed the student note and the components of the history  of the present illness, the physical exam, and the assessment and plan documented were performed by me or were performed in my presence by the student where I verified the documentation and performed the exam and medical decision making.    [name], MD  [facility] Internal Medicine PGY-1

## 2018-05-23 NOTE — Unmapped (Addendum)
Outpatient Follow Up:   -Follow up w/ Dr. Wilford Corner for possible repeat manometry outpatient.??  -Follow up appointment w/ Dr. Garner Nash     Hospital-acquired pneumonia:??patient presented with a 1 day hx SOB that feels similar to prior episodes of aspiration PNA, wet cough, malaise, in setting of recent EGD for stricture dilation on 6/13. CXR shows faint patchy basilar opacities, c/f aspiration pneumonia. Patient was started on Unasyn (6/15-6/18), transitioned to PO augmented for discharge. On 6/18, WBC elevated to 18, concerning for HAP, lower Respiratory Culture growing 2+ gram positive bacilli and 1+ gram positive cocci and started on Cefepime/Vanc. Culture grew OROPHARYNGEAL FLORA and Bcx negative at 72 hrs with clinical improvement and was transitioned back to oral Levofloxacin to complete course. Pulm following and has outpatient follow up.      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 Wilford Corner [facility]), admitted on 6/13 for progressive dysphagia, had EGD found to have esophageal stenosis (s/p balloon dilation on 6/13) and scalloped mucosa, biopsied, path results show duodenal mucosa with intact villous architecture and no increased intraepithelial lymphocytes identified. F/u w/ Dr. Wilford Corner for repeat manometry outpatient.??  ??  Elevated lipase, resolved: incidentally found on admission labs, elevated to >500. Had nausea yesterday, no abdominal pain. Repeat lipase from 6/16 was 162, patient asymptomatic.     Chronic Stable Medical Conditions: ??  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 In past year, has been to??ED >10 times for exacerbations. He is following with Dr. Garner Nash. Continued home azithro MWF, AC, metanebs, beudsonide, Brovana, Duonebs 3% HTS, home Trilogy. ??    Bipolar 1 Disorder, PTSD:??Has followed [facility] and undergone psychotherapy previously. Home Celexa, Valproate, and Mirtazapine continued.   ??  Hypothryoidism:????Continued home synthroid  ??  Acne:????Continued home minocycline

## 2018-05-23 NOTE — Unmapped (Signed)
Report given to Elige Radon, Charity fundraiser. Patient care transferred at this time.
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
## 2018-05-23 NOTE — Unmapped (Signed)
Pt AOx4, VSS. Vent when sleeping, RA w speaking valve when awake. NSR on monitors when wearing them- independent walking around room. Turning independently. Adequate UO. Good PO intake. No c/o pain. Nausea managed through PRN medication. Pt has not had any visitors, call light and bedside table within reach, free from falls/injuries, q2 rounds performed; will continue to monitor.      Problem: Adult Inpatient Plan of Care  Goal: Plan of Care Review  Outcome: Progressing  Goal: Patient-Specific Goal (Individualization)  Outcome: Progressing  Goal: Absence of Hospital-Acquired Illness or Injury  Outcome: Progressing  Goal: Optimal Comfort and Wellbeing  Outcome: Progressing  Goal: Readiness for Transition of Care  Outcome: Progressing  Goal: Rounds/Family Conference  Outcome: Progressing     Problem: Obstructive Sleep Apnea Risk or Actual (Comorbidity Management)  Goal: Unobstructed Breathing During Sleep  Outcome: Progressing     Problem: Pain Chronic (Persistent) (Comorbidity Management)  Goal: Acceptable Pain Control and Functional Ability  Outcome: Progressing

## 2018-05-23 NOTE — Unmapped (Signed)
ED Progress Note    [facility]  Emergency Department Progress Note for Continuation of Care      May 22, 2018 7:44 PM    Ceejay Kegley is a 27 y.o. male who was signed out to me.  I have reviewed the available documentation and test results.    Brief History and Assessment:     I have assumed care of this patient.    Briefly, this is a 27 y.o. male with a history of VAT ER syndrome with tracheostomy and G-tube status post recent esophageal stricture dilation who presented to the ED with productive cough and shortness of breath.  Chest x-ray shows possible aspiration pneumonia.  He has been started on Unasyn.  He will be admitted to general medicine team.      [facility] ED Course:     7:44 PM: Discussed patient's case with admitting team, they will see him down in the emergency department and plan for admission.    He has been admitted to [facility].    ED Clinical Impression     Final diagnoses:   Shortness of breath (Primary)

## 2018-05-24 LAB — CBC
HEMATOCRIT: 38.3 % — ABNORMAL LOW (ref 41.0–53.0)
HEMOGLOBIN: 12.3 g/dL — ABNORMAL LOW (ref 13.5–17.5)
MEAN CORPUSCULAR HEMOGLOBIN CONC: 32 g/dL (ref 31.0–37.0)
MEAN CORPUSCULAR VOLUME: 77.6 fL — ABNORMAL LOW (ref 80.0–100.0)
MEAN PLATELET VOLUME: 7.3 fL (ref 7.0–10.0)
PLATELET COUNT: 423 10*9/L (ref 150–440)
RED BLOOD CELL COUNT: 4.94 10*12/L (ref 4.50–5.90)
RED CELL DISTRIBUTION WIDTH: 15.1 % — ABNORMAL HIGH (ref 12.0–15.0)
WBC ADJUSTED: 8.4 10*9/L (ref 4.5–11.0)

## 2018-05-24 LAB — BASIC METABOLIC PANEL
ANION GAP: 7 mmol/L — ABNORMAL LOW (ref 9–15)
BUN / CREAT RATIO: 14
CALCIUM: 9.7 mg/dL (ref 8.5–10.2)
CHLORIDE: 102 mmol/L (ref 98–107)
CO2: 31 mmol/L — ABNORMAL HIGH (ref 22.0–30.0)
CREATININE: 0.7 mg/dL (ref 0.70–1.30)
EGFR MDRD AF AMER: >=60 mL/min/{1.73_m2} (ref >=60)
EGFR MDRD NON AF AMER: >=60 mL/min/{1.73_m2} (ref >=60)
GLUCOSE RANDOM: 92 mg/dL (ref 65–99)
POTASSIUM: 4.5 mmol/L (ref 3.5–5.0)
SODIUM: 140 mmol/L (ref 135–145)

## 2018-05-24 LAB — MEAN PLATELET VOLUME: Lab: 7.3

## 2018-05-24 LAB — CO2: Carbon dioxide:SCnc:Pt:Ser/Plas:Qn:: 31 — ABNORMAL HIGH

## 2018-05-24 NOTE — Unmapped (Signed)
Daily Progress Note    Assessment/Plan: Cameron Baker is a 27 y.o. male with history of VATER syndrome s/p trach/peg??w/ frequent admissions for chronic tracheobronchitis, hypothyroidism, asthma, bipolar 1 disorder, recent hospitalization 6/13-6/14 for new type II achalasia s/p esophageal dilation who re-presented to ED for productive cough, SOB, subjective fevers, similar to prior occurrences of aspiration pneumonia.       Principal Problem:    Aspiration pneumonia (CMS-HCC)  Active Problems:    Asthma    Hypothyroidism    Gastrostomy status (CMS-HCC)    Tracheostomy dependence (CMS-HCC)    VATER syndrome       LOS: 2 days   ??  ??  Aspiration pneumonia: Presented with hx SOB that felt similar to prior episodes of aspiration PNA, wet cough, malaise, in setting of recent EGD for stricture dilation on 6/13. CXR shows faint patchy basilar opacities, c/f aspiration pneumonia.   -Continue Unasyn (6/15- ), plan to transition to Augmentin on d/c for 10 day course total, plan to d/c home on 6/18. His primary pulmonologist, Dr. Garner Nash is in agreement.   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 Wilford Corner [facility]), admitted on 6/13 for progressive dysphagia, had EGD found to have esophageal stenosis (s/p balloon dilation on 6/13) and scalloped mucosa, concerning for celiac disease, which was biopsied. Path results are pending. F/u w/ Dr. Wilford Corner for possible repeat manometry outpatient.??  - continue PPI  - f/u pathology results  ??  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 In past year, has been to??ED >10 times for exacerbations. He will follow with Dr. Garner Nash on 05/25/18.  -??Continued home azithro MWF, AC -??metanebs, beudsonide, Brovana, Duonebs 3% HTS,   -??continued home Trilogy    Elevated lipase, resolving: incidentally found on admission labs, elevated to >500. Had nausea yesterday, no abdominal pain. Repeat lipase from  (6/16) is 162.      Chronic Stable Medical Conditions: ??  Bipolar 1 Disorder, PTSD:??Has followed [facility] and undergone psychotherapy previously.??  -??Continued home Celexa, Valproate, and Mirtazapine  ??  Hypothryoidism:????Continued home synthroid  ??  Acne:????Continued home minocycline    ------------------------------------------------------------------------------------    Subjective:   No acute events overnight. Afebrile, symptoms improving.     Objective:     Vital signs in last 24 hours:  Temp:  [36.4 ??C-36.9 ??C] 36.9 ??C  Heart Rate:  [64-92] 82  SpO2 Pulse:  [94] 94  Resp:  [9-21] 21  BP: (100-203)/(48-103) 162/97  MAP (mmHg):  [63-136] 117  FiO2 (%):  [21 %-28 %] 28 %  SpO2:  [99 %-100 %] 100 %    Intake/Output last 3 shifts:  I/O last 3 completed shifts:  In: 2030 [P.O.:1200; NG/GT:330; IV Piggyback:500]  Out: 3175 [Urine:3175]    Physical Exam:  General: young overweight man with trach, resting comfortably in bed  HEENT: no scleral icterus or conjunctival effusion.   Pulm:  Normal respiratory effort. Wheezing, rales present bilaterally.    Neuro: A&Ox3, no focal neurological deficits  Psych: appropriate mood and affect.

## 2018-05-24 NOTE — Unmapped (Signed)
Pt AOx4, VSS. Had one episode of elevated BP (203/103 and 190/93 recheck); MD aware. BP returned to a more normal level around 0400 (142/74). Vent 21% when sleeping, RA w/speaking valve when awake. NSR on monitors when wearing them- independent walking around room. Turning independently. Adequate UO. Good PO intake. Pt c/o pain; prn given. Nausea managed through PRN medication. Pt has not had any visitors, call light and bedside table within reach, free from falls/injuries, q2 rounds performed; will continue to monitor.      Problem: Adult Inpatient Plan of Care  Goal: Plan of Care Review  Outcome: Progressing  Goal: Patient-Specific Goal (Individualization)  Outcome: Progressing  Goal: Absence of Hospital-Acquired Illness or Injury  Outcome: Progressing  Goal: Optimal Comfort and Wellbeing  Outcome: Progressing  Goal: Readiness for Transition of Care  Outcome: Progressing  Goal: Rounds/Family Conference  Outcome: Progressing     Problem: Obstructive Sleep Apnea Risk or Actual (Comorbidity Management)  Goal: Unobstructed Breathing During Sleep  Outcome: Progressing     Problem: Pain Chronic (Persistent) (Comorbidity Management)  Goal: Acceptable Pain Control and Functional Ability  Outcome: Progressing     Problem: Communication Impairment (Mechanical Ventilation, Invasive)  Goal: Effective Communication  Outcome: Progressing     Problem: Device-Related Complication Risk (Mechanical Ventilation, Invasive)  Goal: Optimal Device Function  Outcome: Progressing     Problem: Skin and Tissue Injury (Mechanical Ventilation, Invasive)  Goal: Absence of Device-Related Skin and Tissue Injury  Outcome: Progressing     Problem: Ventilator-Induced Lung Injury (Mechanical Ventilation, Invasive)  Goal: Absence of Ventilator-Induced Lung Injury  Outcome: Progressing

## 2018-05-24 NOTE — Unmapped (Signed)
Pt A&O x4. VSS. RA with speaking valve during day and 21% ventilator at night. No complaints of pain. Complaint of nausea, PRN phenergan IV given x1 and PRN zofran IV given x1. Pt ambulatory and can turn self in bed. No BM this shift. All meds given via mickey button. Pt taking PO nutrition and fluids well. Standard precautions maintained. Bed low, call bell and bedside table within reach. Will continue to monitor.     Problem: Adult Inpatient Plan of Care  Goal: Plan of Care Review  Outcome: Progressing  Goal: Patient-Specific Goal (Individualization)  Outcome: Progressing  Goal: Absence of Hospital-Acquired Illness or Injury  Outcome: Progressing  Goal: Optimal Comfort and Wellbeing  Outcome: Progressing  Goal: Readiness for Transition of Care  Outcome: Progressing  Goal: Rounds/Family Conference  Outcome: Progressing     Problem: Obstructive Sleep Apnea Risk or Actual (Comorbidity Management)  Goal: Unobstructed Breathing During Sleep  Outcome: Progressing     Problem: Pain Chronic (Persistent) (Comorbidity Management)  Goal: Acceptable Pain Control and Functional Ability  Outcome: Progressing     Problem: Communication Impairment (Mechanical Ventilation, Invasive)  Goal: Effective Communication  Outcome: Progressing     Problem: Device-Related Complication Risk (Mechanical Ventilation, Invasive)  Goal: Optimal Device Function  Outcome: Progressing     Problem: Skin and Tissue Injury (Mechanical Ventilation, Invasive)  Goal: Absence of Device-Related Skin and Tissue Injury  Outcome: Progressing     Problem: Ventilator-Induced Lung Injury (Mechanical Ventilation, Invasive)  Goal: Absence of Ventilator-Induced Lung Injury  Outcome: Progressing     Problem: Fall Injury Risk  Goal: Absence of Fall and Fall-Related Injury  Outcome: Progressing     Problem: Venous Thromboembolism  Goal: VTE (Venous Thromboembolism) Symptom Resolution  Outcome: Progressing

## 2018-05-25 DIAGNOSIS — R0602 Shortness of breath: Principal | ICD-10-CM

## 2018-05-25 LAB — BASOPHILS ABSOLUTE COUNT: Lab: 0

## 2018-05-25 LAB — BASIC METABOLIC PANEL
BLOOD UREA NITROGEN: 9 mg/dL (ref 7–21)
BUN / CREAT RATIO: 10
CHLORIDE: 97 mmol/L — ABNORMAL LOW (ref 98–107)
CO2: 31 mmol/L — ABNORMAL HIGH (ref 22.0–30.0)
CREATININE: 0.88 mg/dL (ref 0.70–1.30)
EGFR MDRD AF AMER: >=60 mL/min/{1.73_m2} (ref >=60)
EGFR MDRD NON AF AMER: >=60 mL/min/{1.73_m2} (ref >=60)
GLUCOSE RANDOM: 135 mg/dL (ref 65–179)
POTASSIUM: 4.4 mmol/L (ref 3.5–5.0)
SODIUM: 138 mmol/L (ref 135–145)

## 2018-05-25 LAB — POTASSIUM: Potassium:SCnc:Pt:Ser/Plas:Qn:: 4.4

## 2018-05-25 LAB — ADDON DIFFERENTIAL ONLY
EOSINOPHILS ABSOLUTE COUNT: 0.3 10*9/L (ref 0.0–0.4)
LARGE UNSTAINED CELLS: 1 % (ref 0–4)
LYMPHOCYTES ABSOLUTE COUNT: 1.5 10*9/L (ref 1.5–5.0)
MONOCYTES ABSOLUTE COUNT: 0.8 10*9/L (ref 0.2–0.8)
NEUTROPHILS ABSOLUTE COUNT: 15.4 10*9/L — ABNORMAL HIGH (ref 2.0–7.5)

## 2018-05-25 LAB — CBC
HEMATOCRIT: 38.8 % — ABNORMAL LOW (ref 41.0–53.0)
HEMOGLOBIN: 12.5 g/dL — ABNORMAL LOW (ref 13.5–17.5)
MEAN CORPUSCULAR HEMOGLOBIN CONC: 32.3 g/dL (ref 31.0–37.0)
MEAN CORPUSCULAR HEMOGLOBIN: 25.2 pg — ABNORMAL LOW (ref 26.0–34.0)
MEAN PLATELET VOLUME: 7.3 fL (ref 7.0–10.0)
PLATELET COUNT: 412 10*9/L (ref 150–440)
RED BLOOD CELL COUNT: 4.99 10*12/L (ref 4.50–5.90)
RED CELL DISTRIBUTION WIDTH: 15.2 % — ABNORMAL HIGH (ref 12.0–15.0)
WBC ADJUSTED: 18.1 10*9/L — ABNORMAL HIGH (ref 4.5–11.0)

## 2018-05-25 LAB — MEAN PLATELET VOLUME: Lab: 7.3

## 2018-05-25 NOTE — Unmapped (Signed)
Pt A&O x4. VSS. 21-28% TC and satting well. Using vent at night. No complaints of pain. Complaints of nausea, PRN phenergan given and PRN zofran given. Pt ambulatory and can turn self in bed. No BM this shift. Pt went for 2 view xray this afternoon. MD paged regarding PICC, no response. Standard precautions maintained. Bed low, call bell and bedside table within reach. Will continue to monitor.     Problem: Adult Inpatient Plan of Care  Goal: Plan of Care Review  Outcome: Progressing  Goal: Patient-Specific Goal (Individualization)  Outcome: Progressing  Goal: Absence of Hospital-Acquired Illness or Injury  Outcome: Progressing  Goal: Optimal Comfort and Wellbeing  Outcome: Progressing  Goal: Readiness for Transition of Care  Outcome: Progressing  Goal: Rounds/Family Conference  Outcome: Progressing     Problem: Obstructive Sleep Apnea Risk or Actual (Comorbidity Management)  Goal: Unobstructed Breathing During Sleep  Outcome: Progressing     Problem: Pain Chronic (Persistent) (Comorbidity Management)  Goal: Acceptable Pain Control and Functional Ability  Outcome: Progressing     Problem: Communication Impairment (Mechanical Ventilation, Invasive)  Goal: Effective Communication  Outcome: Progressing     Problem: Device-Related Complication Risk (Mechanical Ventilation, Invasive)  Goal: Optimal Device Function  Outcome: Progressing     Problem: Skin and Tissue Injury (Mechanical Ventilation, Invasive)  Goal: Absence of Device-Related Skin and Tissue Injury  Outcome: Progressing     Problem: Ventilator-Induced Lung Injury (Mechanical Ventilation, Invasive)  Goal: Absence of Ventilator-Induced Lung Injury  Outcome: Progressing     Problem: Fall Injury Risk  Goal: Absence of Fall and Fall-Related Injury  Outcome: Progressing     Problem: Venous Thromboembolism  Goal: VTE (Venous Thromboembolism) Symptom Resolution  Outcome: Progressing

## 2018-05-25 NOTE — Unmapped (Signed)
No issues overnight. A&O x 4. VS stable. On room air while awake, 30% ventilator at HS. Small amount of secretions. Lower respiratory culture collected. Oxycodone x 1 for generalized pain. Independently ambulatory. No falls. Refused SCDs for VTE prophylaxis. Will CTM.      Problem: Adult Inpatient Plan of Care  Goal: Plan of Care Review  Outcome: Progressing  Goal: Patient-Specific Goal (Individualization)  Outcome: Progressing  Goal: Absence of Hospital-Acquired Illness or Injury  Outcome: Progressing  Goal: Optimal Comfort and Wellbeing  Outcome: Progressing  Goal: Readiness for Transition of Care  Outcome: Progressing  Goal: Rounds/Family Conference  Outcome: Progressing     Problem: Obstructive Sleep Apnea Risk or Actual (Comorbidity Management)  Goal: Unobstructed Breathing During Sleep  Outcome: Progressing     Problem: Pain Chronic (Persistent) (Comorbidity Management)  Goal: Acceptable Pain Control and Functional Ability  Outcome: Progressing     Problem: Communication Impairment (Mechanical Ventilation, Invasive)  Goal: Effective Communication  Outcome: Progressing     Problem: Device-Related Complication Risk (Mechanical Ventilation, Invasive)  Goal: Optimal Device Function  Outcome: Progressing     Problem: Skin and Tissue Injury (Mechanical Ventilation, Invasive)  Goal: Absence of Device-Related Skin and Tissue Injury  Outcome: Progressing     Problem: Ventilator-Induced Lung Injury (Mechanical Ventilation, Invasive)  Goal: Absence of Ventilator-Induced Lung Injury  Outcome: Progressing     Problem: Fall Injury Risk  Goal: Absence of Fall and Fall-Related Injury  Outcome: Progressing     Problem: Venous Thromboembolism  Goal: VTE (Venous Thromboembolism) Symptom Resolution  Outcome: Progressing

## 2018-05-25 NOTE — Unmapped (Signed)
General Medicine ([facility]) Daily Progress Note    Assessment/Plan: Cameron Baker is a 27 y.o. male with history of VATER syndrome s/p trach/peg??w/ frequent admissions for chronic tracheobronchitis, hypothyroidism, asthma, bipolar 1 disorder, recent hospitalization 6/13-6/14 for new type II achalasia s/p esophageal dilation who re-presented to ED for productive cough, SOB, subjective fevers, similar to prior occurrences of aspiration pneumonia.     Principal Problem:    Aspiration pneumonia (CMS-HCC)  Active Problems:    Asthma    Hypothyroidism    Gastrostomy status (CMS-HCC)    Tracheostomy dependence (CMS-HCC)    VATER syndrome       LOS: 3 days   ??  ??  Pneumonia c/f HAP: In setting of recent EGD for stricture dilation on 6/13, CXR showed faint patchy basilar opacities, c/f aspiration pneumonia. On 6/18 WBC elevated to 18, concerning for HAP given Lower Respiratory Culture growing 2+ gram positive bacilli and 1+ gram positive cocci. Plan to transition to Vanc & Cefepime for MRSA and pseudomonal coverage.   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 Wilford Corner [facility]), admitted on 6/13 for progressive dysphagia, had EGD found to have esophageal stenosis (s/p balloon dilation on 6/13) and scalloped mucosa, concerning for celiac disease, which was biopsied. Path results are pending. F/u w/ Dr. Wilford Corner for possible repeat manometry outpatient.??  - continue PPI  - f/u pathology results  ??  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 In past year, has been to??ED >10 times for exacerbations. He will follow with Dr. Garner Nash.   -??Continued home azithro MWF, AC  -??metanebs, beudsonide, Brovana, Duonebs 3% HTS,   -??continued home Trilogy    Bipolar 1 Disorder, PTSD:??Has followed [facility] and undergone psychotherapy previously.??  -??Continued home Celexa, Valproate, and Mirtazapine  ??  Hypothryoidism:????Continued home synthroid  ??  Acne:????Continued home minocycline    ------------------------------------------------------------------------------------    Subjective:   Feels worse today, had worsening SOB overnight requiring albuterol. Reports chills and nausea.     Objective:     Vital signs in last 24 hours:  Temp:  [36.8 ??C (98.3 ??F)-37.6 ??C (99.6 ??F)] 37 ??C (98.6 ??F)  Heart Rate:  [65-107] 74  SpO2 Pulse:  [64-88] 88  Resp:  [12-21] 12  BP: (122-162)/(82-97) 161/95  MAP (mmHg):  [97-117] 109  FiO2 (%):  [30 %-35 %] 35 %  SpO2:  [95 %-100 %] 98 %    Intake/Output last 3 shifts:  I/O last 3 completed shifts:  In: 1250 [P.O.:600; NG/GT:350; IV Piggyback:300]  Out: 4300 [Urine:4300]    Physical Exam:  General: young overweight man with trach, resting comfortably in bed  HEENT: no scleral icterus or conjunctival effusion.   Pulm:  Normal respiratory effort. Wheezing, rales present bilaterally.    Neuro: A&Ox3, no focal neurological deficits  Psych: appropriate mood and affect.     [name], MS3    I attest that I have reviewed the student note and the components of the history of the present illness, the physical exam, and the assessment and plan documented were performed by me or were performed in my  presence by the student where I verified the documentation and performed the exam and medical decision making.    [name], MD  [facility] Internal Medicine PGY-1

## 2018-05-25 NOTE — Unmapped (Signed)
I met with Cameron Baker, he as been admitted for possible aspiration pneumonia.  He states that he has not been able to use his cough assist because he was readmitted shortly after receiving it.       He stated that his home care RN is still scheduled for training this afternoon.      I called [facility] and notified Suzan that [name] was admitted into the [facility].  She stated that she is planning on delivering the Trilogy on 6/18 in the morning.

## 2018-05-25 NOTE — Unmapped (Signed)
Vancomycin Therapeutic Monitoring Pharmacy Note    Cameron Baker is a 27 y.o. male starting vancomycin. Date of therapy initiation: 05/25/18    Indication: Pneumonia    Prior Dosing Information: Previous regimen 1250 mg IV q8h (93.7 kg, Cr ~ 0.76) from 11/2017 admission    Goals:  Therapeutic Drug Levels  Vancomycin trough goal: 15-20 mg/L    Additional Clinical Monitoring/Outcomes  Renal function, volume status (intake and output)    Results: Not applicable    Wt Readings from Last 1 Encounters:   05/25/18 98.8 kg (217 lb 13 oz)     Creatinine   Date Value Ref Range Status   05/25/2018 0.88 0.70 - 1.30 mg/dL Final   16/09/9603 5.40 0.70 - 1.30 mg/dL Final   98/10/9146 8.29 0.70 - 1.30 mg/dL Final        Pharmacokinetic Considerations and Significant Drug Interactions:  ? Concurrent nephrotoxic meds: not applicable    Assessment/Plan:  Recommended Dose  ? Start 1250 mg IV q8h  ? Estimated trough on recommended regimen: 17 mg/L    Follow-up  ? Level due: prior to fourth or fifth dose  ? A pharmacist will continue to monitor and order levels as appropriate    Please page service pharmacist with questions/clarifications.    Laretta Alstrom, PharmD

## 2018-05-25 NOTE — Unmapped (Signed)
Patient maintained on home vent settings , tolerated  all neb and metaneb treatments well.

## 2018-05-26 LAB — BLOOD GAS, VENOUS
BASE EXCESS VENOUS: 6.9 — ABNORMAL HIGH (ref -2.0–2.0)
HCO3 VENOUS: 32 mmol/L — ABNORMAL HIGH (ref 22–27)
O2 SATURATION VENOUS: 84.8 % (ref 40.0–85.0)
PCO2 VENOUS: 59 mmHg (ref 40–60)
PH VENOUS: 7.36 (ref 7.32–7.43)
PO2 VENOUS: 52 mmHg (ref 30–55)

## 2018-05-26 LAB — MEAN CORPUSCULAR HEMOGLOBIN CONC: Lab: 31.8

## 2018-05-26 LAB — CBC
HEMATOCRIT: 35.9 % — ABNORMAL LOW (ref 41.0–53.0)
HEMOGLOBIN: 11.4 g/dL — ABNORMAL LOW (ref 13.5–17.5)
MEAN CORPUSCULAR HEMOGLOBIN: 25 pg — ABNORMAL LOW (ref 26.0–34.0)
MEAN PLATELET VOLUME: 7.4 fL (ref 7.0–10.0)
PLATELET COUNT: 368 10*9/L (ref 150–440)
RED BLOOD CELL COUNT: 4.56 10*12/L (ref 4.50–5.90)
WBC ADJUSTED: 8.2 10*9/L (ref 4.5–11.0)

## 2018-05-26 LAB — FIO2 VENOUS

## 2018-05-26 LAB — VANCOMYCIN RANDOM: Vancomycin^random:MCnc:Pt:Ser/Plas:Qn:: 18.2

## 2018-05-26 NOTE — Unmapped (Signed)
VSS on vent/TC. Oxy given for generalized pain. Pt continues to c/o of nausea- zofran and phenergan given. Pt up independently in room.  IV abx given as scheduled- pt requesting a PICC line. Team to follow up. Safety precautions in place. Will continue to monitor.     Problem: Adult Inpatient Plan of Care  Goal: Plan of Care Review  Outcome: Progressing  Goal: Patient-Specific Goal (Individualization)  Outcome: Progressing  Goal: Absence of Hospital-Acquired Illness or Injury  Outcome: Progressing  Goal: Optimal Comfort and Wellbeing  Outcome: Progressing  Goal: Readiness for Transition of Care  Outcome: Progressing  Goal: Rounds/Family Conference  Outcome: Progressing     Problem: Obstructive Sleep Apnea Risk or Actual (Comorbidity Management)  Goal: Unobstructed Breathing During Sleep  Outcome: Progressing     Problem: Pain Chronic (Persistent) (Comorbidity Management)  Goal: Acceptable Pain Control and Functional Ability  Outcome: Progressing     Problem: Communication Impairment (Mechanical Ventilation, Invasive)  Goal: Effective Communication  Outcome: Progressing     Problem: Device-Related Complication Risk (Mechanical Ventilation, Invasive)  Goal: Optimal Device Function  Outcome: Progressing     Problem: Skin and Tissue Injury (Mechanical Ventilation, Invasive)  Goal: Absence of Device-Related Skin and Tissue Injury  Outcome: Progressing     Problem: Ventilator-Induced Lung Injury (Mechanical Ventilation, Invasive)  Goal: Absence of Ventilator-Induced Lung Injury  Outcome: Progressing     Problem: Fall Injury Risk  Goal: Absence of Fall and Fall-Related Injury  Outcome: Progressing     Problem: Venous Thromboembolism  Goal: VTE (Venous Thromboembolism) Symptom Resolution  Outcome: Progressing     Problem: Infection (Pneumonia)  Goal: Resolution of Infection Signs/Symptoms  Outcome: Progressing

## 2018-05-26 NOTE — Unmapped (Addendum)
No issues overnight. A&O x 4. VS stable. Afebrile. On room air while awake. O2 sats dropped to mid 80s. Placed on 35% trach mask. 30% ventilator at HS. Oxycodone x 1 for generalized pain. Phenergan x 1 for nausea. Smog enema given for constipation which was effective. Independently ambulatory. No falls. Refused SCDs for VTE prophylaxis. Will CTM.          Problem: Adult Inpatient Plan of Care  Goal: Plan of Care Review  Outcome: Progressing  Goal: Patient-Specific Goal (Individualization)  Outcome: Progressing  Goal: Absence of Hospital-Acquired Illness or Injury  Outcome: Progressing  Goal: Optimal Comfort and Wellbeing  Outcome: Progressing  Goal: Readiness for Transition of Care  Outcome: Progressing  Goal: Rounds/Family Conference  Outcome: Progressing     Problem: Obstructive Sleep Apnea Risk or Actual (Comorbidity Management)  Goal: Unobstructed Breathing During Sleep  Outcome: Progressing     Problem: Pain Chronic (Persistent) (Comorbidity Management)  Goal: Acceptable Pain Control and Functional Ability  Outcome: Progressing     Problem: Communication Impairment (Mechanical Ventilation, Invasive)  Goal: Effective Communication  Outcome: Progressing     Problem: Device-Related Complication Risk (Mechanical Ventilation, Invasive)  Goal: Optimal Device Function  Outcome: Progressing     Problem: Skin and Tissue Injury (Mechanical Ventilation, Invasive)  Goal: Absence of Device-Related Skin and Tissue Injury  Outcome: Progressing     Problem: Ventilator-Induced Lung Injury (Mechanical Ventilation, Invasive)  Goal: Absence of Ventilator-Induced Lung Injury  Outcome: Progressing     Problem: Fall Injury Risk  Goal: Absence of Fall and Fall-Related Injury  Outcome: Progressing     Problem: Venous Thromboembolism  Goal: VTE (Venous Thromboembolism) Symptom Resolution  Outcome: Progressing

## 2018-05-26 NOTE — Unmapped (Signed)
I vistited with [name].  [facility] did not deliver the Trilogy ventilator.  He is currently using the hospital provided trilogy at night and metaneb with treatments.  I contacted [facility], Cameron Baker was unaware that Cameron Baker had stated she would come on 6/18, but they planned on coming tomorrow, 05/27/18 at 1000.

## 2018-05-26 NOTE — Unmapped (Signed)
Patients trach care was done without complications. He received all inhaled respiratory medications. Currently resting comfortably on the ventilator. Will continue to monitor.

## 2018-05-26 NOTE — Unmapped (Signed)
Vancomycin Therapeutic Monitoring Pharmacy Note    Mackinley Kiehn is a 27 y.o. male starting vancomycin. Date of therapy initiation: 05/25/18    Indication: Pneumonia    Prior Dosing Information: Current regimen 1250 mg IV q8h via PIV    Goals:  Therapeutic Drug Levels  Vancomycin trough goal: 15-20 mg/L    Additional Clinical Monitoring/Outcomes  Renal function, volume status (intake and output)    Results:   Vancomycin level: 18.2 mg/L (drawn 2.5 hours early), extrapolated trough = 13.6 mg/L  Ad      Wt Readings from Last 1 Encounters:   05/26/18 100.3 kg (221 lb 1.9 oz)     Creatinine   Date Value Ref Range Status   05/25/2018 0.88 0.70 - 1.30 mg/dL Final   56/21/3086 5.78 0.70 - 1.30 mg/dL Final   46/96/2952 8.41 0.70 - 1.30 mg/dL Final        Pharmacokinetic Considerations and Significant Drug Interactions:  ? Adult (calculated on 05/26/18): Ke = 0.1 hr-1  ? Concurrent nephrotoxic meds: not applicable    Assessment/Plan:  Recommended Dose  ? Increase dose to 1500 mg IV q8h  ? Estimated trough on recommended regimen: 17 mg/L    Follow-up  ? Level due: prior to fourth or fifth dose of new regimen  ? A pharmacist will continue to monitor and order levels as appropriate    Please page service pharmacist with questions/clarifications.    Laretta Alstrom, PharmD

## 2018-05-26 NOTE — Unmapped (Signed)
General Medicine ([facility]) Daily Progress Note    Assessment/Plan: Cameron Baker is a 27 y.o. male with history of VATER syndrome s/p trach/peg??w/ frequent admissions for chronic tracheobronchitis, hypothyroidism, asthma, bipolar 1 disorder, recent hospitalization 6/13-6/14 for new type II achalasia s/p esophageal dilation who re-presented to ED for productive cough, SOB, subjective fevers, c/f aspiration pneumonia. On 6/18, patient's symptoms worsened and leukocytosis to 18, concerning for HAP.     Principal Problem:    HAP (hospital-acquired pneumonia)  Active Problems:    Asthma    Hypothyroidism    Gastrostomy status (CMS-HCC)    Tracheostomy dependence (CMS-HCC)    VATER syndrome       LOS: 4 days   ??  ??  Hospital-acquired Pneumonia c/f HAP: recent stricture dilation on 6/13, initially concerning for aspiration pneumonia. On 6/18, WBC elevated to 18, concerning for HAP given lower respiratory Culture growing 2+ gram positive bacilli and 1+ gram positive cocci. Transitioned to Vanc & Cefepime for MRSA and pseudomonal coverage. Repeat CXR from 6/18 was clear. Leukocytosis resolved, WBC 8 today (6/19).   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 Wilford Corner [facility]), admitted on 6/13 for progressive dysphagia, had EGD found to have esophageal stenosis (s/p balloon dilation on 6/13) and scalloped mucosa,  biopsied. Path results results show duodenal mucosa with intact villous architecture and no increased intraepithelial lymphocytes identified. F/u w/ Dr. Wilford Corner for possible repeat manometry outpatient.??  - continue PPI    Chronic Tracheobronchitis:??VATER Syndrome-associated??tracheobronchomalacia with tracheostomy placement and??nocturnal mechanical??ventilation, has recurrent episodes of tracheobronchitis as a result. In past year, has been to??ED >10 times for exacerbations. He follows with Dr. Garner Nash.   -??Continue home azithro MWF, AC  -??metanebs, beudsonide, Brovana, Duonebs 3% HTS,   -??continue home Trilogy    Bipolar 1 Disorder, PTSD:??Has followed [facility] and undergone psychotherapy previously.??  -??Continue home Celexa, Valproate, and Mirtazapine  ??  Hypothryoidism:????Continued home synthroid  ??  Acne:????Continued home minocycline    ------------------------------------------------------------------------------------    Subjective:   Patient had some nausea, improved with Phenergan. Also complained of worsening constipation, improved after enema. No chest pain, no worsening SOB.  O2 sats dropped to mid 80s overnight, but improved after vent settings adjusted.     Objective:     Vital signs in last 24 hours:  Temp:  [36.6 ??C (97.8 ??F)-37.1 ??C (98.8 ??F)] 36.7 ??C (98.1 ??F)  Heart Rate:  [68-101] 68  SpO2 Pulse:  [68-101] 68  Resp:  [6-14] 8  BP: (106-149)/(44-82) 106/44  MAP (mmHg):  [62-104] 62  FiO2 (%):  [28 %-35 %] 30 %  SpO2:  [95 %-99 %] 99 %    Intake/Output last 3 shifts:  I/O last 3 completed shifts:  In: 2262.5 [P.O.:730; NG/GT:470; IV Piggyback:1062.5]  Out: 2775 [Urine:2775]    Physical Exam:  General: young overweight man with trach, resting comfortably in bed  HEENT: no scleral icterus or conjunctival effusion.   Pulm:  Normal respiratory effort. Wheezing, rales present bilaterally.    Neuro: A&Ox3, no focal neurological deficits  Psych: appropriate mood and affect.  [name], MS3    I attest that I have reviewed the student note and the components of the history of the present illness, the physical exam, and the assessment and plan documented were performed by me or were performed in my presence by the student where I verified the documentation and performed the exam and medical decision making.    [name], MD  [facility] Internal Medicine PGY-1

## 2018-05-26 NOTE — Unmapped (Signed)
VENOUS ACCESS ULTRASOUND PROCEDURE NOTE    Indications:   Poor venous access.    The Venous Access Team has assessed this patient for the placement of a PIV. Ultrasound guidance was necessary to obtain access.     Procedure Details:  Risks, benefits and alternatives discussed with patient. Identity of the patient was confirmed via name, medical record number and date of birth. The availability of the correct equipment was verified.    The vein was identified for ultrasound catheter insertion.  Field was prepared with necessary supplies and equipment.  Probe cover and sterile gel utilized.  Insertion site was prepped with chlorhexidine solution and allowed to dry.  The catheter extension was primed with normal saline.A(n) 20 g x 1.75 inch catheter was placed in the right forearm with 1 attempt(s).     Catheter aspirated, 10 mL blood return present. The catheter was then flushed with 10 mL of normal saline. Insertion site cleansed, and dressing applied per manufacturer guidelines. The catheter was inserted without difficulty  by Franchot Gallo RN.    The primary RN was notified.     Thank you,     Franchot Gallo RN Venous Access Team   [phone]     Workup / Procedure Time:  30 minutes    See vein image below:

## 2018-05-27 LAB — CBC
HEMATOCRIT: 37.3 % — ABNORMAL LOW (ref 41.0–53.0)
HEMOGLOBIN: 11.7 g/dL — ABNORMAL LOW (ref 13.5–17.5)
MEAN CORPUSCULAR HEMOGLOBIN CONC: 31.5 g/dL (ref 31.0–37.0)
MEAN CORPUSCULAR HEMOGLOBIN: 24.8 pg — ABNORMAL LOW (ref 26.0–34.0)
MEAN CORPUSCULAR VOLUME: 78.8 fL — ABNORMAL LOW (ref 80.0–100.0)
MEAN PLATELET VOLUME: 7.2 fL (ref 7.0–10.0)
PLATELET COUNT: 348 10*9/L (ref 150–440)
RED BLOOD CELL COUNT: 4.73 10*12/L (ref 4.50–5.90)
WBC ADJUSTED: 8.2 10*9/L (ref 4.5–11.0)

## 2018-05-27 LAB — BASIC METABOLIC PANEL
ANION GAP: 7 mmol/L — ABNORMAL LOW (ref 9–15)
BLOOD UREA NITROGEN: 6 mg/dL — ABNORMAL LOW (ref 7–21)
BUN / CREAT RATIO: 9
CALCIUM: 9.1 mg/dL (ref 8.5–10.2)
CHLORIDE: 100 mmol/L (ref 98–107)
CO2: 32 mmol/L — ABNORMAL HIGH (ref 22.0–30.0)
CREATININE: 0.67 mg/dL — ABNORMAL LOW (ref 0.70–1.30)
EGFR MDRD AF AMER: >=60 mL/min/{1.73_m2} (ref >=60)
EGFR MDRD NON AF AMER: >=60 mL/min/{1.73_m2} (ref >=60)
GLUCOSE RANDOM: 113 mg/dL (ref 65–179)
SODIUM: 139 mmol/L (ref 135–145)

## 2018-05-27 LAB — MEAN CORPUSCULAR HEMOGLOBIN: Lab: 24.8 — ABNORMAL LOW

## 2018-05-27 LAB — EGFR MDRD NON AF AMER: Glomerular filtration rate/1.73 sq M.predicted.non black:ArVRat:Pt:Ser/Plas/Bld:Qn:Creatinine-based formula (MDRD): >=60

## 2018-05-27 NOTE — Unmapped (Signed)
A&O X4. VSS. Room air. Pt plans to go on vent to sleep. Afebrile. Prn oxy given once for pain in rib cage. Prn Phenergan given once for nausea. Enema given per pt request this evening. Adequate intake and output. Pt ambulates independently. Skin around g tube site remains red/irritated. Pt denies pain or itching at site and states that site has been irritated for a while. Topical ointment applied to site as ordered. Pt refused bath multiple times. Free from falls/injury. ROUNDs q 2 hours. Pt turns self. Will continue to monitor.     Problem: Adult Inpatient Plan of Care  Goal: Plan of Care Review  Outcome: Progressing  Goal: Patient-Specific Goal (Individualization)  Outcome: Progressing  Goal: Absence of Hospital-Acquired Illness or Injury  Outcome: Progressing  Goal: Optimal Comfort and Wellbeing  Outcome: Progressing  Goal: Readiness for Transition of Care  Outcome: Progressing  Goal: Rounds/Family Conference  Outcome: Progressing     Problem: Obstructive Sleep Apnea Risk or Actual (Comorbidity Management)  Goal: Unobstructed Breathing During Sleep  Outcome: Progressing     Problem: Pain Chronic (Persistent) (Comorbidity Management)  Goal: Acceptable Pain Control and Functional Ability  Outcome: Progressing     Problem: Communication Impairment (Mechanical Ventilation, Invasive)  Goal: Effective Communication  Outcome: Progressing     Problem: Device-Related Complication Risk (Mechanical Ventilation, Invasive)  Goal: Optimal Device Function  Outcome: Progressing     Problem: Skin and Tissue Injury (Mechanical Ventilation, Invasive)  Goal: Absence of Device-Related Skin and Tissue Injury  Outcome: Progressing     Problem: Ventilator-Induced Lung Injury (Mechanical Ventilation, Invasive)  Goal: Absence of Ventilator-Induced Lung Injury  Outcome: Progressing     Problem: Fall Injury Risk  Goal: Absence of Fall and Fall-Related Injury  Outcome: Progressing     Problem: Venous Thromboembolism  Goal: VTE (Venous Thromboembolism) Symptom Resolution  Outcome: Progressing     Problem: Infection (Pneumonia)  Goal: Resolution of Infection Signs/Symptoms  Outcome: Progressing

## 2018-05-27 NOTE — Unmapped (Signed)
General Medicine ([facility]) Daily Progress Note    Assessment/Plan: Cameron Baker is a 27 y.o. male with history of VATER syndrome s/p trach/peg??w/ frequent admissions for chronic tracheobronchitis, hypothyroidism, asthma, bipolar 1 disorder, recent hospitalization 6/13-6/14 for new type II achalasia s/p esophageal dilation who re-presented to ED for productive cough, SOB, subjective fevers, c/f aspiration pneumonia. On 6/18, patient's symptoms worsened with leukocytosis to 18, concerning for HAP.     Principal Problem:    HAP (hospital-acquired pneumonia)  Active Problems:    Asthma    Hypothyroidism    Gastrostomy status (CMS-HCC)    Tracheostomy dependence (CMS-HCC)    VATER syndrome       LOS: 5 days   ??  ??  Hospital-acquired Pneumonia: recent stricture dilation on 6/13, initially concerning for aspiration pneumonia. On 6/18, WBC elevated to 18, concerning for HAP given lower respiratory Culture growing 2+ gram positive bacilli and 1+ gram positive cocci. Transitioned to Vanc & Cefepime for MRSA and pseudomonal coverage. Repeat CXR from 6/18 was clear. Leukocytosis resolved, WBC 8.2 today (6/20).   - s/p Unasyn (6/15-6/18)  - s/p Augmentin (6/18)   - Continue Vanc + Cefepime (6/18 - )  - Blood cultures NGTD  - f/u on lower respiratory culture to guide oral antibiotic selection, will like result 6/20 pm  - continue home inhalers and trilogy vent at night in stepdown (trach dependent status)     Chronic Stable Medical Problems:   Type II Achalasia 2/2 VATER syndrome??s/p recent EGD dilation: ??Follows with Dr. Wilford Corner [facility]), admitted on 6/13 for progressive dysphagia, had EGD found to have esophageal stenosis (s/p balloon dilation on 6/13) and scalloped mucosa,  biopsied. Path results results show duodenal mucosa with intact villous architecture and no increased intraepithelial lymphocytes identified. F/u w/ Dr. Wilford Corner for possible repeat manometry outpatient.??  - continue PPI    Chronic Tracheobronchitis:??VATER Syndrome-associated??tracheobronchomalacia with tracheostomy placement and??nocturnal mechanical??ventilation, has recurrent episodes of tracheobronchitis as a result. In past year, has been to??ED >10 times for exacerbations. He follows with Dr. Garner Nash.   -??Continue home azithro MWF, AC  -??metanebs, beudsonide, Brovana, Duonebs 3% HTS,   -??continue home Trilogy    Bipolar 1 Disorder, PTSD:??Has followed [facility] and undergone psychotherapy previously.??  -??Continue home Celexa, Valproate, and Mirtazapine  ??  Hypothryoidism:????Continued home synthroid  ??  Acne:????Continued home minocycline    ------------------------------------------------------------------------------------    Subjective:   No acute events overnight, patient is feeling improved today.     Objective:     Vital signs in last 24 hours:  Temp:  [36.4 ??C-37.2 ??C] 36.4 ??C  Heart Rate:  [70-103] 70  SpO2 Pulse:  [69-104] 69  Resp:  [10-20] 10  BP: (144-164)/(79-94) 144/82  MAP (mmHg):  [99-112] 99  FiO2 (%):  [30 %] 30 %  SpO2:  [97 %-99 %] 98 %    Intake/Output last 3 shifts:  I/O last 3 completed shifts:  In: 4370 [P.O.:1915; NG/GT:470; IV Piggyback:1985]  Out: 4300 [Urine:4300]    Physical Exam:  General: young overweight man with trach, resting comfortably in bed  HEENT: no scleral icterus or conjunctival effusion.   Pulm:  Normal respiratory effort. Wheezing, rales present bilaterally similar to prior  Neuro: A&Ox3, no focal neurological deficits  Psych: appropriate mood and affect.

## 2018-05-27 NOTE — Unmapped (Signed)
Pt is being switched to new home trilogy machine at this time.  No other concerns at this time.

## 2018-05-27 NOTE — Unmapped (Signed)
[name] from [facility] contacted me and asked when Cameron Baker would be Dc'd, she was hoping next week...  I told her that we expected discharge tomorrow.  She said that they would be up today to deliver the Trilogy.

## 2018-05-27 NOTE — Unmapped (Signed)
Pt has requested to change his current MDI albuterol tx to the nebulizer albuterol instead.  Resident on call was paged.  No other concerns at this time.

## 2018-05-28 LAB — CBC
HEMATOCRIT: 34.9 % — ABNORMAL LOW (ref 41.0–53.0)
HEMOGLOBIN: 11.3 g/dL — ABNORMAL LOW (ref 13.5–17.5)
MEAN CORPUSCULAR HEMOGLOBIN CONC: 32.3 g/dL (ref 31.0–37.0)
MEAN CORPUSCULAR VOLUME: 78.2 fL — ABNORMAL LOW (ref 80.0–100.0)
MEAN PLATELET VOLUME: 7.4 fL (ref 7.0–10.0)
RED BLOOD CELL COUNT: 4.46 10*12/L — ABNORMAL LOW (ref 4.50–5.90)
RED CELL DISTRIBUTION WIDTH: 14.8 % (ref 12.0–15.0)
WBC ADJUSTED: 9.4 10*9/L (ref 4.5–11.0)

## 2018-05-28 LAB — RED BLOOD CELL COUNT: Lab: 4.46 — ABNORMAL LOW

## 2018-05-28 MED ORDER — LEVOFLOXACIN 750 MG TABLET
ORAL_TABLET | ORAL | 0 refills | 0 days | Status: SS
Start: 2018-05-28 — End: 2018-06-11

## 2018-05-28 NOTE — Unmapped (Signed)
Pt aaox4, vss on room air / 30% vent when sleeping. Afebrile and no complaints of pain. Pt c/o nausea, prn's given as indicated. Adequate PO intake, adequate urine output. Abx admin as scheduled. SCD's for vte/dvt/pe prophylaxis (refused), no sx noted. Fall reduction protocol maintained. Pt turns self in bed, no skin breakdown noted. Will ctm / maintain safe env.       Problem: Adult Inpatient Plan of Care  Goal: Plan of Care Review  Outcome: Progressing  Goal: Patient-Specific Goal (Individualization)  Outcome: Progressing  Goal: Absence of Hospital-Acquired Illness or Injury  Outcome: Progressing  Goal: Optimal Comfort and Wellbeing  Outcome: Progressing  Goal: Readiness for Transition of Care  Outcome: Progressing  Goal: Rounds/Family Conference  Outcome: Progressing     Problem: Obstructive Sleep Apnea Risk or Actual (Comorbidity Management)  Goal: Unobstructed Breathing During Sleep  Outcome: Progressing     Problem: Pain Chronic (Persistent) (Comorbidity Management)  Goal: Acceptable Pain Control and Functional Ability  Outcome: Progressing     Problem: Communication Impairment (Mechanical Ventilation, Invasive)  Goal: Effective Communication  Outcome: Progressing     Problem: Device-Related Complication Risk (Mechanical Ventilation, Invasive)  Goal: Optimal Device Function  Outcome: Progressing     Problem: Skin and Tissue Injury (Mechanical Ventilation, Invasive)  Goal: Absence of Device-Related Skin and Tissue Injury  Outcome: Progressing     Problem: Ventilator-Induced Lung Injury (Mechanical Ventilation, Invasive)  Goal: Absence of Ventilator-Induced Lung Injury  Outcome: Progressing     Problem: Fall Injury Risk  Goal: Absence of Fall and Fall-Related Injury  Outcome: Progressing     Problem: Venous Thromboembolism  Goal: VTE (Venous Thromboembolism) Symptom Resolution  Outcome: Progressing     Problem: Infection (Pneumonia)  Goal: Resolution of Infection Signs/Symptoms  Outcome: Progressing

## 2018-05-28 NOTE — Unmapped (Signed)
Problem: Adult Inpatient Plan of Care  Goal: Plan of Care Review  Outcome: Progressing  Goal: Patient-Specific Goal (Individualization)  Outcome: Progressing  Goal: Absence of Hospital-Acquired Illness or Injury  Outcome: Progressing  Goal: Optimal Comfort and Wellbeing  Outcome: Progressing  Goal: Readiness for Transition of Care  Outcome: Progressing  Goal: Rounds/Family Conference  Outcome: Progressing     Problem: Fall Injury Risk  Goal: Absence of Fall and Fall-Related Injury  Outcome: Progressing     Problem: Self-Care Deficit  Goal: Improved Ability to Complete Activities of Daily Living  Outcome: Progressing     Problem: Diabetes Comorbidity  Goal: Blood Glucose Level Within Desired Range  Outcome: Progressing     Problem: Pain Acute  Goal: Optimal Pain Control  Outcome: Progressing     Problem: Skin Injury Risk Increased  Goal: Skin Health and Integrity  Outcome: Progressing     Problem: Wound  Goal: Optimal Wound Healing  Outcome: Progressing     Problem: Postoperative Stoma Care (Colostomy)  Goal: Optimal Stoma Healing  Outcome: Progressing     Problem: Infection (Sepsis/Septic Shock)  Goal: Absence of Infection Signs/Symptoms  Outcome: Progressing     Problem: Infection  Goal: Infection Symptom Resolution  Outcome: Progressing     Problem: Device-Related Complication Risk (Artificial Airway)  Goal: Optimal Device Function  Outcome: Progressing     Problem: Device-Related Complication Risk (Hemodialysis)  Goal: Safe, Effective Therapy Delivery  Outcome: Progressing     Problem: Hemodynamic Instability (Hemodialysis)  Goal: Vital Signs Remain in Desired Range  Outcome: Progressing     Problem: Infection (Hemodialysis)  Goal: Absence of Infection Signs/Symptoms  Outcome: Progressing     Problem: Venous Thromboembolism  Goal: VTE (Venous Thromboembolism) Symptom Resolution  Outcome: Progressing     Problem: Communication Impairment (Artificial Airway)  Goal: Effective Communication  Outcome: Progressing Problem: Communication Impairment (Mechanical Ventilation, Invasive)  Goal: Effective Communication  Outcome: Progressing

## 2018-05-28 NOTE — Unmapped (Signed)
Pt given nebulizer tx via Metaneb this morning.  Pt was not on the Trilogy machine upon arrival to pt room.  Pt was asleep with no concerns upon arrival to room.  Pt does become agitated when woken up for tx, however will tolerate well.  No other concerns at this time.

## 2018-05-28 NOTE — Unmapped (Signed)
Pt is getting metaneb tx currently  Before discharge is complete.  Pt has discharge paperwork in hand and ready to leave very shortly. No concerns at this time.

## 2018-05-28 NOTE — Unmapped (Signed)
A&O x4. BP ranging high 90s-130s systolic. Pt on room air for majority of shift. Pt puts himself on home vent to sleep. Afebrile. No c/o pain. Pt received prn phenergan x3. Tizanidine x1 overnight. Smog enema given per pt request. Adequate intake and output. Last BM 05/27/18. Free from falls/injury. ROUNDs/turns q 2hours. Will continue to monitor.     Problem: Adult Inpatient Plan of Care  Goal: Plan of Care Review  05/28/2018 0437 by Karena Addison, RN  Outcome: Progressing  05/28/2018 0321 by Karena Addison, RN  Outcome: Progressing  Goal: Patient-Specific Goal (Individualization)  05/28/2018 0437 by Karena Addison, RN  Outcome: Progressing  05/28/2018 0321 by Karena Addison, RN  Outcome: Progressing  Goal: Absence of Hospital-Acquired Illness or Injury  05/28/2018 0437 by Karena Addison, RN  Outcome: Progressing  05/28/2018 0321 by Karena Addison, RN  Outcome: Progressing  Goal: Optimal Comfort and Wellbeing  05/28/2018 0437 by Karena Addison, RN  Outcome: Progressing  05/28/2018 0321 by Karena Addison, RN  Outcome: Progressing  Goal: Readiness for Transition of Care  05/28/2018 0437 by Karena Addison, RN  Outcome: Progressing  05/28/2018 0321 by Karena Addison, RN  Outcome: Progressing  Goal: Rounds/Family Conference  05/28/2018 0437 by Karena Addison, RN  Outcome: Progressing  05/28/2018 0321 by Karena Addison, RN  Outcome: Progressing     Problem: Obstructive Sleep Apnea Risk or Actual (Comorbidity Management)  Goal: Unobstructed Breathing During Sleep  05/28/2018 0437 by Karena Addison, RN  Outcome: Progressing  05/28/2018 0321 by Karena Addison, RN  Outcome: Progressing     Problem: Pain Chronic (Persistent) (Comorbidity Management)  Goal: Acceptable Pain Control and Functional Ability  05/28/2018 0437 by Karena Addison, RN  Outcome: Progressing  05/28/2018 0321 by Karena Addison, RN  Outcome: Progressing     Problem: Communication Impairment (Mechanical Ventilation, Invasive)  Goal: Effective Communication  05/28/2018 0437 by Karena Addison, RN  Outcome: Progressing  05/28/2018 0321 by Karena Addison, RN  Outcome: Progressing     Problem: Device-Related Complication Risk (Mechanical Ventilation, Invasive)  Goal: Optimal Device Function  05/28/2018 0437 by Karena Addison, RN  Outcome: Progressing  05/28/2018 0321 by Karena Addison, RN  Outcome: Progressing     Problem: Skin and Tissue Injury (Mechanical Ventilation, Invasive)  Goal: Absence of Device-Related Skin and Tissue Injury  05/28/2018 0437 by Karena Addison, RN  Outcome: Progressing  05/28/2018 0321 by Karena Addison, RN  Outcome: Progressing     Problem: Ventilator-Induced Lung Injury (Mechanical Ventilation, Invasive)  Goal: Absence of Ventilator-Induced Lung Injury  05/28/2018 0437 by Karena Addison, RN  Outcome: Progressing  05/28/2018 0321 by Karena Addison, RN  Outcome: Progressing     Problem: Fall Injury Risk  Goal: Absence of Fall and Fall-Related Injury  05/28/2018 0437 by Karena Addison, RN  Outcome: Progressing  05/28/2018 0321 by Karena Addison, RN  Outcome: Progressing     Problem: Venous Thromboembolism  Goal: VTE (Venous Thromboembolism) Symptom Resolution  05/28/2018 0437 by Karena Addison, RN  Outcome: Progressing  05/28/2018 0321 by Karena Addison, RN  Outcome: Progressing     Problem: Infection (Pneumonia)  Goal: Resolution of Infection Signs/Symptoms  05/28/2018 0437 by Karena Addison, RN  Outcome: Progressing  05/28/2018 0321 by Karena Addison, RN  Outcome: Progressing

## 2018-05-28 NOTE — Unmapped (Signed)
Physician Discharge Summary [facility]  [facility]  [address]  [place] [address]  Dept: [phone]  Loc: [phone]     Identifying Information:   Cameron Baker  1991-09-23  [id number]    Primary Care Physician: Ander Slade, MD   Code Status: Full Code    Admit Date: 05/22/2018    Discharge Date: 05/28/2018     Discharge To: Home    Discharge Service: [facility] - GEN Med Doristine Counter     Discharge Attending Physician: Lillia Carmel, MD    Discharge Diagnoses:  Principal Problem:    HAP (hospital-acquired pneumonia)  Active Problems:    Asthma    Hypothyroidism    Gastrostomy status (CMS-HCC)    Tracheostomy dependence (CMS-HCC)    VATER syndrome  Resolved Problems:    * No resolved hospital problems. [facility] Course:   Outpatient Follow Up:   -Follow up w/ Dr. Wilford Corner for possible repeat manometry outpatient.??  -Follow up appointment w/ Dr. Garner Nash     Hospital-acquired pneumonia:??patient presented with a 1 day hx SOB that feels similar to prior episodes of aspiration PNA, wet cough, malaise, in setting of recent EGD for stricture dilation on 6/13. CXR shows faint patchy basilar opacities, c/f aspiration pneumonia.  Patient was started on Unasyn (6/15-6/18), transitioned to PO augmented for discharge. On 6/18, WBC elevated to 18, concerning for HAP, lower Respiratory Culture growing 2+ gram positive bacilli and 1+ gram positive cocci and started on Cefepime/Vanc. Culture grew OROPHARYNGEAL FLORA and Bcx negative at 72 hrs with clinical improvement and was transitioned back to oral Levofloxacin through 05/31/18 to complete course. Pulm following and has outpatient follow up.      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 Wilford Corner [facility]), admitted on 6/13 for progressive dysphagia, had EGD found to have esophageal stenosis (s/p balloon dilation on 6/13) and scalloped mucosa, biopsied, path results show duodenal mucosa with intact villous architecture and no increased intraepithelial lymphocytes identified. F/u w/ Dr. Wilford Corner for repeat manometry outpatient.??  ??  Elevated lipase, resolved: incidentally found on admission labs, elevated to >500. Had nausea yesterday, no abdominal pain. Repeat lipase from 6/16 was 162, patient asymptomatic.     Chronic Stable Medical Conditions: ??  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 In past year, has been to??ED >10 times for exacerbations. He is following with Dr. Garner Nash. Continued home azithro MWF, AC, metanebs, beudsonide, Brovana, Duonebs 3% HTS, home Trilogy. ??    Bipolar 1 Disorder, PTSD:??Has followed [facility] and undergone psychotherapy previously. Home Celexa, Valproate, and Mirtazapine continued.   ??  Hypothryoidism:????Continued home synthroid  ??  Acne:????Continued home minocycline          Procedures:  No admission procedures for hospital encounter.  ______________________________________________________________________  Discharge Medications:     Your Medication List      STOP taking these medications    codeine-guaifenesin 6.3-100 mg/5 mL Liqd        START taking these medications    levoFLOXacin 750 MG tablet  Commonly known as:  LEVAQUIN  Take 1 tablet (750 mg total) by mouth daily. for 3 days        CHANGE how you take these medications    diclofenac sodium 1 % gel  Commonly known as:  VOLTAREN  Apply 2 g topically Four (4) times a day.  What changed:    ??  when to take this  ?? reasons to take this        CONTINUE taking these medications    acetaminophen 160 mg/5 mL (5 mL) suspension  Commonly known as:  TYLENOL  15.6 mL (500 mg total) by G-tube route every four (4) hours as needed for pain.     albuterol 90 mcg/actuation inhaler  Commonly known as:  PROAIR HFA  Inhale 2 puffs every six (6) hours as needed for wheezing.     arformoterol  Commonly known as: BROVANA  Inhale 2 mL (15 mcg total) by nebulization Two (2) times a day.     azithromycin 500 MG tablet  Commonly known as:  ZITHROMAX  1 tablet (500 mg total) by G-tube route 3 (three) times a week.     budesonide 0.5 mg/2 mL nebulizer solution  Commonly known as:  PULMICORT  Inhale 4 mL (1 mg total) by nebulization Two (2) times a day.     budesonide-formoterol 160-4.5 mcg/actuation inhaler  Commonly known as:  SYMBICORT  Inhale 2 puffs Two (2) times a day.     cetirizine 10 MG tablet  Commonly known as:  ZyrTEC  1 tablet (10 mg total) by G-tube route every evening.     cholecalciferol (vitamin D3) 2,000 unit Cap  Commonly known as:  VITAMIN D3  3 capsules (6,000 Units total) by G-tube route daily.     citalopram 40 MG tablet  Commonly known as:  CeleXA  1 tablet (40 mg total) by G-tube route nightly.     clindamycin 1 % gel  Commonly known as:  CLINDAGEL  Apply topically Two (2) times a day.     dicyclomine 10 mg capsule  Commonly known as:  BENTYL  10 mg by G-tube route 4 (four) times a day as needed.     fluticasone propionate 50 mcg/actuation nasal spray  Commonly known as:  FLONASE  2 sprays by Each Nare route daily.     gabapentin 300 MG capsule  Commonly known as:  NEURONTIN  300 mg by G-tube route Three (3) times a day.     ibuprofen 600 MG tablet  Commonly known as:  ADVIL,MOTRIN  1 tablet (600 mg total) by G-tube route every six (6) hours as needed for pain.     ipratropium-albuterol 0.5-2.5 mg/3 mL nebulizer  Commonly known as:  DUO-NEB  Inhale 3 mL 4 (four) times a day.     levothyroxine 75 MCG tablet  Commonly known as:  SYNTHROID, LEVOTHROID  1 tablet (75 mcg total) by G-tube route daily.     linaclotide 290 mcg capsule  Commonly known as:  LINZESS  1 capsule (290 mcg total) by G-tube route daily.     LORazepam 0.5 MG tablet  Commonly known as:  ATIVAN  Take 1 tablet (0.5mg ) twice daily and 1 tablet (0.5mg ) daily as needed for anxiety via G-tube     minocycline 100 MG capsule  Commonly known as: MINOCIN,DYNACIN  1 capsule (100 mg total) by G-tube route nightly.     mirtazapine 15 MG tablet  Commonly known as:  REMERON  1 tablet (15 mg total) by G-tube route nightly.     montelukast 10 mg tablet  Commonly known as:  SINGULAIR  Take 1 tablet (10 mg total) by mouth nightly.     omeprazole 20 MG capsule  Commonly known as:  PriLOSEC  20 mg Two (2) times a day (30 minutes before a meal). Via G-tube  oxyCODONE 5 MG immediate release tablet  Commonly known as:  ROXICODONE  5 mg by G-tube route every four (4) hours as needed for pain.     PARI LC D NEBULIZER Misc  Generic drug:  nebulizers  Provide 1 device  for use with inhaled medication.     polyethylene glycol 17 gram packet  Commonly known as:  MIRALAX  17 g by G-tube route daily as needed.     promethazine 6.25 mg/5 mL syrup  Commonly known as:  PHENERGAN  Take 10 mL (12.5 mg total) by mouth 4 (four) times a day as needed for nausea. for up to 7 days     sodium chloride 3 % nebulizer solution  Inhale 4 mL by nebulization 4 (four) times a day.     tiZANidine 4 MG tablet  Commonly known as:  ZANAFLEX  Take 4 mg by mouth every twelve (12) hours as needed.     tobramycin (PF) 300 mg/5 mL nebulizer solution  Commonly known as:  TOBI  Inhale 5 mL (300 mg total) by nebulization every twelve (12) hours. Every other month     triamcinolone 0.1 % cream  Commonly known as:  KENALOG  Apply topically Two (2) times a day.     valproate 250 mg/5 mL syrup  Commonly known as:  DEPAKENE  TAKE BY G-TUBE EVERY MORNING AND EVERY EVENING        ASK your doctor about these medications    codeine-guaifenesin 10-100 mg/5 mL liquid  Commonly known as:  GUAIFENESIN AC  Take 5 mL by mouth Three (3) times a day as needed for cough. for up to 5 days  Ask about: Should I take this medication?            Allergies:  Xopenex [levalbuterol hcl]; Allopurinol analogues; and Versed [midazolam]  ______________________________________________________________________  Pending Test Results (if blank, then none):   Order Current Status    Lower Respiratory Culture Preliminary result          Most Recent Labs:  All lab results last 24 hours -   Recent Results (from the past 24 hour(s))   ECG 12 Lead    Collection Time: 05/27/18  6:56 PM   Result Value Ref Range    EKG Systolic BP  mmHg    EKG Diastolic BP  mmHg    EKG Ventricular Rate 87 BPM    EKG Atrial Rate 87 BPM    EKG P-R Interval 128 ms    EKG QRS Duration 90 ms    EKG Q-T Interval 342 ms    EKG QTC Calculation 411 ms    EKG Calculated P Axis 24 degrees    EKG Calculated R Axis 27 degrees    EKG Calculated T Axis 51 degrees    QTC Fredericia 387 ms   CBC    Collection Time: 05/28/18  9:00 AM   Result Value Ref Range    WBC 9.4 4.5 - 11.0 10*9/L    RBC 4.46 (L) 4.50 - 5.90 10*12/L    HGB 11.3 (L) 13.5 - 17.5 g/dL    HCT 16.1 (L) 09.6 - 53.0 %    MCV 78.2 (L) 80.0 - 100.0 fL    MCH 25.3 (L) 26.0 - 34.0 pg    MCHC 32.3 31.0 - 37.0 g/dL    RDW 04.5 40.9 - 81.1 %    MPV 7.4 7.0 - 10.0 fL    Platelet 344 150 - 440 10*9/L  Relevant Studies/Radiology (if blank, then none):  Xr Chest 2 Views    Result Date: 05/25/2018  EXAM: XR CHEST 2 VIEWS DATE: 05/25/2018 1:27 PM ACCESSION: [id number] UN DICTATED: 05/25/2018 2:06 PM INTERPRETATION LOCATION: [facility] CLINICAL INDICATION: 27 years old Male with DYSPNEA  COMPARISON: 05/22/2018 chest radiographs TECHNIQUE: PA and Lateral Chest Radiographs. FINDINGS: Lungs are hypoinflated. Tracheostomy tube terminates 3 cm above the carina. Radiographically clear lungs. No pleural effusion or pneumothorax. Unremarkable cardiomediastinal silhouette.     Clear lungs.     Xr Chest 2 Views    Result Date: 05/22/2018  EXAM: CHEST TWO VIEW DATE: 05/22/2018 at 16:42 ACCESSION: [id number] UN DICTATED: 05/22/2018 4:55 PM INTERPRETATION LOCATION: [facility] CLINICAL INDICATION: 27 years old Male with SHORTNESS OF BREATH  COMPARISON: 05/03/2018 TECHNIQUE:PA and lateral views of the chest. FINDINGS: Tracheostomy cannula is unchanged. Mild hypoinflation with faint bibasilar patchy opacities. No pleural effusions. No pneumothorax. Stable cardiomediastinal silhouette. Surgical clips overlying the left upper quadrant.     --Minimally hypoinflated lungs with faint patchy basilar opacities which could reflect atelectasis or aspiration.    ______________________________________________________________________  Discharge Instructions:   Activity Instructions     Activity as tolerated            Diet Instructions     Discharge diet (specify)      Discharge Nutrition Therapy:  General              Follow Up instructions and Outpatient Referrals     Call MD for:  difficulty breathing, headache or visual disturbances      Call MD for:  persistent nausea or vomiting      Call MD for:  redness, tenderness, or signs of infection (pain, swelling, redness, odor or green/yellow discharge around incision site)      Call MD for: Temperature > 38.5 Celsius ( > 101.3 Fahrenheit)      Discharge instructions      Cameron Baker, you were admitted to [facility] for breathing issues and likely had inflammation in your lung from food/oral secretions getting into your lungs.  You were initially treated with IV antibiotics, switched to broader IV antibiotics, then switched to Levaquin when your symptoms improved and your sputum culture showed mixed oral flora and did not grow any specific bacteria.     We recommend close follow up with your primary care provider and local pulmonary provider. Take your medications and a list of questions so your providers can better guide your care.               Appointments which have been scheduled for you    Jun 29, 2018 12:00 PM EDT  (Arrive by 11:45 AM)  RETURN PFT 15 with [facility] PFT 4  [facility] PULMONARY SPECIALTY FUNCT [facility] REGION) [address]  [address]  [place] [address]  [phone]      Jun 29, 2018 12:30 PM EDT  (Arrive by 12:15 PM)  RETURN BRONCHIECTSIS with Truett Mainland, MD  [facility] PULMONARY SPECIALTY CL [facility] [facility] REGION) [address]  [address]  [place] [address]  [phone]      Jul 05, 2018 10:20 AM EDT  (Arrive by 9:50 AM)  RETURN  GENERAL with Liane Comber, MD  [facility]  [facility] REGION) [address]  [place] [address]  [phone]  ______________________________________________________________________  Discharge Day Services:  BP 106/43  - Pulse 80  - Temp 37 ??C (Oral)  - Resp 12  - Ht 167.6 cm (5' 6)  - Wt (!) 102.3 kg (225 lb 8.5 oz)  - SpO2 100%  - BMI 36.40 kg/m??   Pt seen on the day of discharge and determined appropriate for discharge.    Condition at Discharge: fair    Length of Discharge: I spent greater than 30 mins in the discharge of this patient.

## 2018-05-28 NOTE — Unmapped (Signed)
Problem: Obstructive Sleep Apnea Risk or Actual (Comorbidity Management)  Goal: Unobstructed Breathing During Sleep  Outcome: Progressing     Problem: Communication Impairment (Mechanical Ventilation, Invasive)  Goal: Effective Communication  Outcome: Progressing     Problem: Device-Related Complication Risk (Mechanical Ventilation, Invasive)  Goal: Optimal Device Function  Outcome: Progressing     Problem: Ventilator-Induced Lung Injury (Mechanical Ventilation, Invasive)  Goal: Absence of Ventilator-Induced Lung Injury  Outcome: Progressing  Danley Danker, RRT  05/27/2018  Pt received a different Trilogy machine (with different company) with no concerns.  No adverse events today. Pt requested a med change for his albuterol administration.  Tx was given with no concerns.

## 2018-05-31 NOTE — Unmapped (Signed)
Patient Discharge assessment for Cameron Baker faxed to [facility].

## 2018-06-01 MED ORDER — PROMETHAZINE 6.25 MG/5 ML ORAL SYRUP
Freq: Four times a day (QID) | ORAL | 0 refills | 0.00000 days | Status: CP | PRN
Start: 2018-06-01 — End: 2018-06-30

## 2018-06-01 MED FILL — PARI LC PLUS NEBULIZER SET//MISC: PARI LC PLUS NEBULIZER SET//MISC | 30 days supply | Qty: 2 | Fill #0

## 2018-06-01 MED FILL — TOBRAMYCIN (BOX)/300/5ML/NEBU: TOBRAMYCIN (BOX)/300/5ML/NEBU | 28 days supply | Qty: 1 | Fill #0

## 2018-06-01 NOTE — Unmapped (Signed)
[facility] Refill Coordination Note    Specialty Medication(s) to be Shipped:   CF/Pulmonary: -Tobramycin 300mg /17mL, Pari nebulizer cup    Other medication(s) to be shipped:      Cameron Baker, DOB: [birth year]  Phone: [phone] (home) [phone] (work)  Shipping Address: [address] [place] [address]  All above HIPAA information was verified with patient.     Completed refill call assessment today to schedule patient's medication shipment from the [facility] [phone]).       Specialty medication(s) and dose(s) confirmed: Regimen is correct and unchanged.   Changes to medications: Cameron Baker reports no changes reported at this time.  Changes to insurance: No  Questions for the pharmacist: No    The patient will receive an FSI print out for each medication shipped and additional FDA Medication Guides as required.  Patient education from Cameron Baker or Cameron Baker may also be included in the shipment.    DISEASE-SPECIFIC INFORMATION        For CF patients: CF [facility] Active? No-not enrolled    ADHERENCE     Medication Adherence    Patient reported X missed doses in the last month:  0  Specialty Medication:  STARTING AGAIN PER Cameron Baker. #OH-NONE  Patient is on additional specialty medications:  No  Demonstrates understanding of importance of adherence:  yes  Informant:  patient  Reliability of informant:  reliable  Refills needed for supportive medications:  yes, ordered or provider notified          Refill Coordination    Has the Patients' Contact Information Changed:  Yes  Updated Address Information:  [address] [place] [address]  Is the Shipping Address Different:  No         MEDICARE PART B DOCUMENTATION     Tobramycin 300mg /85ml: Patient has 0 nebulizer vials on hand.    SHIPPING     Shipping address confirmed in FSI.     Delivery Scheduled: Yes, Expected medication delivery date: 06/03/18 via UPS or courier.     Cameron Baker   [facility] Specialty Technician

## 2018-06-02 ENCOUNTER — Encounter: Admit: 2018-06-02 | Discharge: 2018-06-02 | Disposition: A | Discharge status: Home / Self Care | Payer: MEDICARE

## 2018-06-02 ENCOUNTER — Ambulatory Visit: Admit: 2018-06-02 | Discharge: 2018-06-02 | Disposition: A | Discharge status: Home / Self Care | Payer: MEDICARE

## 2018-06-02 DIAGNOSIS — M7989 Other specified soft tissue disorders: Principal | ICD-10-CM

## 2018-06-02 DIAGNOSIS — M79631 Pain in right forearm: Secondary | ICD-10-CM

## 2018-06-02 NOTE — Unmapped (Signed)
[facility]  Emergency Department Provider Note    ED Clinical Impression     Final diagnoses:   Intravenous infiltration, initial encounter (Primary)     Initial Impression, ED Course, Assessment and Plan     Impression: This is a 27 year old male with a past medical history of Eulis Canner syndrome status post trach/PEG with frequent admissions for chronic tracheobronchitis, hypothyroidism, asthma, bipolar disorder, for aspiration pneumonia presenting with right forearm pain in the setting of a IV infiltration 5 days prior.  Presentation arms are symmetric, there is no overlying swelling of the right forearm, no crepitus, no overlying warmth or fluctuance.  Radial pulses 2+, cap refill is less than 2 seconds in all digits.  Ultrasound with compressibility of all forearm veins, no evidence of cellulitis.  Plan to recommend conservative treatment for IV infiltration that occurred 5 days prior with compression dressing, ice/heating packs, NSAIDs and Tylenol.    Additional Medical Decision Making     I have reviewed the vital signs and the nursing notes. Labs and radiology results that were available during my care of the patient were independently reviewed by me and considered in my medical decision making.     I staffed the case with the ED attending, Dr. Laurell Josephs  I independently visualized the radiology images.   I reviewed the patient's prior medical records.    Portions of this record have been created using Scientist, clinical (histocompatibility and immunogenetics). Dictation errors have been sought, but may not have been identified and corrected.  ____________________________________________       History     Chief Complaint  Arm Swelling      HPI   Cameron Baker is a 27 y.o. male with a past medical history of Eulis Canner syndrome status post trach/PEG with frequent admissions for chronic tracheobronchitis, hypothyroidism, asthma, bipolar disorder, for aspiration pneumonia presenting with right forearm pain in the setting of a IV infiltration 5 days prior.  Patient states that he had an IV in his right forearm, states that vancomycin infiltrated through the IV into his right forearm his right forearm.  Denies this does not appear to be documented anywhere in the chart.  He was later discharged to the hospital and states that yesterday he developed swelling and redness of his right forearm.  He states that it is painful to touch and feels like a pins-and-needles sensation in a small area on the volar aspect of his right medial forearm.  he has not taken anything to relieve his symptoms.  He denies any fevers or chills.  He has no other complaints.    Past Medical History:   Diagnosis Date   ??? ADHD    ??? Asthma    ??? Bipolar 1 disorder (CMS-HCC)    ??? Chronic tracheobronchitis (CMS-HCC)    ??? Constipation    ??? De Quervain's tenosynovitis    ??? Dysphagia    ??? Esophageal dysmotility    ??? Esophageal stricture    ??? GERD (gastroesophageal reflux disease)    ??? hypothyroid    ??? Seizures (CMS-HCC)    ??? Tracheobronchomalacia    ??? VATER syndrome    ??? Ventilator dependence (CMS-HCC)        Patient Active Problem List   Diagnosis   ??? Asthma   ??? Attention deficit disorder   ??? Slow transit constipation   ??? Dysphagia   ??? Esophageal reflux   ??? Hypothyroidism   ??? Class 1 obesity in adult   ??? Seizures (CMS-HCC)   ???  Gastrostomy status (CMS-HCC)   ??? Stricture and stenosis of esophagus   ??? Post traumatic stress disorder (PTSD)   ??? Mood disorder (CMS-HCC)   ??? Personality disorder (CMS-HCC)   ??? Chronic tracheobronchitis (CMS-HCC)   ??? Congenital tracheomalacia   ??? Tracheostomy dependence (CMS-HCC)   ??? VATER syndrome   ??? Muscle spasms of both lower extremities   ??? Pain and swelling of right wrist   ??? Hypogammaglobulinemia (CMS-HCC)   ??? Tracheobronchitis   ??? Coronavirus infection   ??? Triangular fibrocartilage complex injury, left, subsequent encounter   ??? Enthesopathy of right hip region   ??? Dyspnea   ??? HAP (hospital-acquired pneumonia)       Past Surgical History:   Procedure Laterality Date   ??? ANAL DILATION  01/21/2006   ??? ESOPHAGOGASTRODUODENOSCOPY      multiple   ??? HIP SURGERY Right 01/19/2017    shaved   ??? LATERAL RECTUS RECESSION  11/15/2003   ??? NISSEN FUNDOPLICATION  2001    x 2 redos   ??? PORTACATH PLACEMENT  01/21/2006    taken out same year   ??? PR ESOPHAGEAL MOTILITY STUDY, MANOMETRY N/A 04/23/2018    Procedure: ESOPHAGEAL MOTILITY STUDY W/INT & REP;  Surgeon: Nurse-Based Giproc;  [facility];  Service: Gastroenterology   ??? PR UP GI ENDOSCOPY,BALL DIL,30MM N/A 02/22/2018    Procedure: UGI ENDO; W/BALLOON DILAT ESOPHAGUS (<30MM DIAM);  Surgeon: Zetta Bills, MD;  Location: [facility];  Service: Gastroenterology   ??? PR UP GI ENDOSCOPY,BALL DIL,30MM N/A 05/20/2018    Procedure: UGI ENDO; W/BALLOON DILAT ESOPHAGUS (<30MM DIAM);  Surgeon: Rona Ravens, MD;  Location: [facility];  Service: Gastroenterology   ??? REMOVAL VENOUS ACCESS [facility] HISTORICAL RESULT)  11/03/2006   ??? STRABISMUS SURGERY Right    ??? TRACHEOSTOMY  1994   ??? WISDOM TOOTH EXTRACTION  04/2012   ??? WRIST SURGERY      3 times.        No current facility-administered medications for this encounter.     Current Outpatient Medications:   ???  acetaminophen (TYLENOL) 160 mg/5 mL (5 mL) suspension, 15.6 mL (500 mg total) by G-tube route every four (4) hours as needed for pain., Disp: 1 Bottle, Rfl: 0  ???  albuterol HFA 90 mcg/actuation inhaler, Inhale 2 puffs every six (6) hours as needed for wheezing., Disp: 1 Inhaler, Rfl: 11  ???  arformoterol (BROVANA), Inhale 2 mL (15 mcg total) by nebulization Two (2) times a day., Disp: 120 mL, Rfl: 11  ???  azithromycin (ZITHROMAX) 500 MG tablet, 1 tablet (500 mg total) by G-tube route 3 (three) times a week., Disp: 12 tablet, Rfl: 11  ???  budesonide (PULMICORT) 0.5 mg/2 mL nebulizer solution, Inhale 4 mL (1 mg total) by nebulization Two (2) times a day., Disp: 240 mL, Rfl: 11  ???  budesonide-formoterol (SYMBICORT) 160-4.5 mcg/actuation inhaler, Inhale 2 puffs Two (2) times a day., Disp: 1 Inhaler, Rfl: 11  ???  cetirizine (ZYRTEC) 10 MG tablet, 1 tablet (10 mg total) by G-tube route every evening., Disp: , Rfl: 0  ???  cholecalciferol, vitamin D3, (VITAMIN D3) 2,000 unit cap, 3 capsules (6,000 Units total) by G-tube route daily., Disp: , Rfl: 0  ???  citalopram (CELEXA) 40 MG tablet, 1 tablet (40 mg total) by G-tube route nightly., Disp: 30 tablet, Rfl: 2  ???  clindamycin (CLINDAGEL) 1 % gel, Apply topically Two (2) times a day., Disp: 60  g, Rfl: 6  ???  diclofenac sodium (VOLTAREN) 1 % gel, Apply 2 g topically Four (4) times a day. (Patient taking differently: Apply 2 g topically 4 (four) times a day as needed. ), Disp: 100 g, Rfl: 6  ???  dicyclomine (BENTYL) 10 mg capsule, 10 mg by G-tube route 4 (four) times a day as needed. , Disp: , Rfl:   ???  fluticasone (FLONASE) 50 mcg/actuation nasal spray, 2 sprays by Each Nare route daily. , Disp: , Rfl:   ???  gabapentin (NEURONTIN) 300 MG capsule, 300 mg by G-tube route Three (3) times a day., Disp: , Rfl:   ???  ibuprofen (ADVIL,MOTRIN) 600 MG tablet, 1 tablet (600 mg total) by G-tube route every six (6) hours as needed for pain., Disp: 30 tablet, Rfl: 1  ???  ipratropium-albuterol (DUO-NEB) 0.5-2.5 mg/3 mL nebulizer, Inhale 3 mL 4 (four) times a day. , Disp: , Rfl:   ???  levothyroxine (SYNTHROID, LEVOTHROID) 75 MCG tablet, 1 tablet (75 mcg total) by G-tube route daily., Disp: 90 tablet, Rfl: 3  ???  linaclotide (LINZESS) 290 mcg capsule, 1 capsule (290 mcg total) by G-tube route daily., Disp: 30 capsule, Rfl: 0  ???  LORazepam (ATIVAN) 0.5 MG tablet, Take 1 tablet (0.5mg ) twice daily and 1 tablet (0.5mg ) daily as needed for anxiety via G-tube, Disp: 70 tablet, Rfl: 2  ???  minocycline (MINOCIN,DYNACIN) 100 MG capsule, 1 capsule (100 mg total) by G-tube route nightly., Disp: 90 capsule, Rfl: 3  ???  mirtazapine (REMERON) 15 MG tablet, 1 tablet (15 mg total) by G-tube route nightly., Disp: 30 tablet, Rfl: 2  ???  montelukast (SINGULAIR) 10 mg tablet, Take 1 tablet (10 mg total) by mouth nightly., Disp: 30 tablet, Rfl: 11  ???  omeprazole (PRILOSEC) 20 MG capsule, 20 mg Two (2) times a day (30 minutes before a meal). Via G-tube, Disp: , Rfl:   ???  oxyCODONE (ROXICODONE) 5 MG immediate release tablet, 5 mg by G-tube route every four (4) hours as needed for pain. , Disp: , Rfl:   ???  PARI LC D NEBULIZER Misc, Provide 1 device  for use with inhaled medication., Disp: 2 each, Rfl: 11  ???  polyethylene glycol (MIRALAX) 17 gram packet, 17 g by G-tube route daily as needed., Disp: 20 packet, Rfl: 0  ???  promethazine (PHENERGAN) 6.25 mg/5 mL syrup, Take 10 mL (12.5 mg total) by mouth 4 (four) times a day as needed for nausea. for up to 7 days, Disp: 120 mL, Rfl: 0  ???  sodium chloride 3 % nebulizer solution, Inhale 4 mL by nebulization 4 (four) times a day., Disp: 480 mL, Rfl: 1  ???  tiZANidine (ZANAFLEX) 4 MG tablet, Take 4 mg by mouth every twelve (12) hours as needed., Disp: , Rfl:   ???  tobramycin, PF, (TOBI) 300 mg/5 mL nebulizer solution, Inhale 5 mL (300 mg total) by nebulization every twelve (12) hours. Every other month, Disp: 280 mL, Rfl: 5  ???  triamcinolone (KENALOG) 0.1 % cream, Apply topically Two (2) times a day., Disp: 15 g, Rfl: 11  ???  valproate (DEPAKENE) 250 mg/5 mL syrup, TAKE BY G-TUBE EVERY MORNING AND EVERY EVENING, Disp: 1850 mL, Rfl: 0    Allergies  Xopenex [levalbuterol hcl]; Allopurinol analogues; and Versed [midazolam]    Family History   Adopted: Yes       Social History  Social History     Tobacco Use   ??? Smoking  status: Never Smoker   ??? Smokeless tobacco: Never Used   Substance Use Topics   ??? Alcohol use: Yes     Alcohol/week: 1.2 oz     Types: 2 Cans of beer per week     Comment: on weekends sometimes   ??? Drug use: No       Review of Systems  Constitutional: Negative for fever.  Eyes: Negative for visual changes.  ENT: Negative for sore throat.  CV: Negative for chest pain.  Resp: Negative for shortness of breath. GI: Negative for abdominal pain.  GU: Negative for dysuria.  MSK: See history of present illness  Derm: See history of present illness  Neuro: Negative for headaches.      Physical Exam     ED Triage Vitals [06/02/18 0217]   Enc Vitals Group      BP 126/71      Heart Rate 84      SpO2 Pulse       Resp 22      Temp 36.7 ??C (98.1 ??F)      Temp Source Skin      SpO2 99 %      Weight 90.7 kg (200 lb)      Height 1.676 m (5' 6)      Head Circumference       Peak Flow       Pain Score       Pain Loc       Pain Edu?       Excl. in GC?        Constitutional: Appears stated age.  HEENT: Normocephalic and atraumatic.Conjunctivae clear. No congestion. Moist mucous membranes.   Heme/Lymph/Immuno: No petechiae or bruising  CV: RRR, no murmurs. Symmetric pulses in all extremities.  Radial pulses 2+ bilaterally.  Resp: Clear to auscultation bilaterally. No wheezes or rhonchii  GI: Soft and non tender, non distended.   GU: There is no CVA tenderness.   MSK: Strength is 5 out of 5 in bilateral upper extremities.  Patient endorses tenderness along the volar aspect of his right distal forearm.  There is no swelling, erythema, fluctuance, or warmth.  neuro: Normal speech and language. No gross focal neurologic deficits appreciated.  Sensation intact to light touch in all dermatomal distributions of the bilateral upper extremities.  Skin: Warm, dry and intact.  Psychiatric: Mood and affect are normal. Speech and behavior are normal.              Loma Sousa, MD  Resident  06/02/18 [phone]

## 2018-06-02 NOTE — Unmapped (Signed)
Patient rounding complete, call bell in reach, bed locked and in lowest position, patient belongings at bedside and within reach of patient.  Patient updated on plan of care.

## 2018-06-02 NOTE — Unmapped (Signed)
Here with c/o R FA pain  Medial side feels hot to touch  Pt recently d/c home and was getting anbx PIV  Pt sts IV infiltrated with vanc and phenergan (pt sent home and was told to use heat)  Now pt here and sts sx worsen, pain increased and sensitivity worsen in the line of PIV area

## 2018-06-02 NOTE — Unmapped (Signed)
[facility] form faxed for POC   Confirmation received

## 2018-06-08 ENCOUNTER — Ambulatory Visit
Admit: 2018-06-08 | Discharge: 2018-06-23 | Disposition: A | Discharge status: Home Health | Payer: MEDICARE | Admitting: Critical Care Medicine

## 2018-06-08 DIAGNOSIS — R05 Cough: Principal | ICD-10-CM

## 2018-06-08 LAB — CBC W/ AUTO DIFF
BASOPHILS ABSOLUTE COUNT: 0.1 10*9/L (ref 0.0–0.1)
BASOPHILS RELATIVE PERCENT: 0.7 %
EOSINOPHILS ABSOLUTE COUNT: 0.2 10*9/L (ref 0.0–0.4)
EOSINOPHILS RELATIVE PERCENT: 1.7 %
HEMATOCRIT: 38.3 % — ABNORMAL LOW (ref 41.0–53.0)
LARGE UNSTAINED CELLS: 2 % (ref 0–4)
LYMPHOCYTES ABSOLUTE COUNT: 2.8 10*9/L (ref 1.5–5.0)
LYMPHOCYTES RELATIVE PERCENT: 28.6 %
MEAN CORPUSCULAR HEMOGLOBIN CONC: 33.5 g/dL (ref 31.0–37.0)
MEAN CORPUSCULAR HEMOGLOBIN: 24.7 pg — ABNORMAL LOW (ref 26.0–34.0)
MEAN CORPUSCULAR VOLUME: 73.6 fL — ABNORMAL LOW (ref 80.0–100.0)
MEAN PLATELET VOLUME: 7.1 fL (ref 7.0–10.0)
MONOCYTES ABSOLUTE COUNT: 0.7 10*9/L (ref 0.2–0.8)
MONOCYTES RELATIVE PERCENT: 7 %
NEUTROPHILS ABSOLUTE COUNT: 5.8 10*9/L (ref 2.0–7.5)
NEUTROPHILS RELATIVE PERCENT: 60.1 %
PLATELET COUNT: 421 10*9/L (ref 150–440)
RED BLOOD CELL COUNT: 5.2 10*12/L (ref 4.50–5.90)
RED CELL DISTRIBUTION WIDTH: 15.5 % — ABNORMAL HIGH (ref 12.0–15.0)
WBC ADJUSTED: 9.6 10*9/L (ref 4.5–11.0)

## 2018-06-08 LAB — COMPREHENSIVE METABOLIC PANEL
ALBUMIN: 4.8 g/dL (ref 3.5–5.0)
ALKALINE PHOSPHATASE: 58 U/L (ref 38–126)
ALT (SGPT): 40 U/L (ref 19–72)
ANION GAP: 16 mmol/L — ABNORMAL HIGH (ref 9–15)
BILIRUBIN TOTAL: 0.4 mg/dL (ref 0.0–1.2)
BLOOD UREA NITROGEN: 12 mg/dL (ref 7–21)
BUN / CREAT RATIO: 16
CALCIUM: 9.9 mg/dL (ref 8.5–10.2)
CHLORIDE: 103 mmol/L (ref 98–107)
CO2: 21 mmol/L — ABNORMAL LOW (ref 22.0–30.0)
CREATININE: 0.77 mg/dL (ref 0.70–1.30)
EGFR CKD-EPI AA MALE: >90 mL/min/{1.73_m2} (ref >=60)
GLUCOSE RANDOM: 89 mg/dL (ref 65–179)
POTASSIUM: 4.2 mmol/L (ref 3.5–5.0)
PROTEIN TOTAL: 7.8 g/dL (ref 6.5–8.3)
SODIUM: 140 mmol/L (ref 135–145)

## 2018-06-08 LAB — ALT (SGPT): Alanine aminotransferase:CCnc:Pt:Ser/Plas:Qn:: 40

## 2018-06-08 LAB — MICROCYTES

## 2018-06-08 NOTE — Unmapped (Signed)
Assumed pt care, pt awake, alert, nad. Pt ringing call bell reports rib pain 7/10 w nausea, will consult w cpoe orders.

## 2018-06-08 NOTE — Unmapped (Signed)
Patient rounds completed. The following patient needs were addressed:  Pain, Toileting, Personal Belongings, Plan of Care, Call Bell in Reach and Bed Position Low .

## 2018-06-08 NOTE — Unmapped (Signed)
Resp. Called for lower resp. Cult. Collection.   Will see pt.

## 2018-06-08 NOTE — Unmapped (Signed)
Patient rounds completed. The following patient needs were addressed:  Pain, Toileting, Positioning;   RIGHT side, Plan of Care, Call Bell in Reach and Bed Position Low .
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
## 2018-06-08 NOTE — Unmapped (Signed)
Patient rounds completed. The following patient needs were addressed:  Pain, Positioning;   SUPINE, Plan of Care, Call Bell in Reach and Bed Position Low .

## 2018-06-08 NOTE — Unmapped (Signed)
Cough and increased mucus starting Thursday. Pt has trach, been needing suctioning more than normal. Pt has chronic respiratory issues, seen here for same in June.

## 2018-06-08 NOTE — Unmapped (Signed)
Pt seen by rt. Pt on humidified oxygen. Pt requesting iv  Nausea meds before getting other gtube meds, med text paged.

## 2018-06-08 NOTE — Unmapped (Signed)
Pt sleeping when observed, pt in nad. Pt has a bed, will transfer to the floor.

## 2018-06-08 NOTE — Unmapped (Signed)
Vancomycin Therapeutic Monitoring Pharmacy Note    Cameron Baker is a 27 y.o. male starting vancomycin. Date of therapy initiation: 06/08/18    Indication: Pneumonia    Prior Dosing Information: Previous regimen 1250 mg q8hrs - 1500 mg q8hrs     Goals:  Therapeutic Drug Levels  Vancomycin trough goal: 15-20 mg/L    Additional Clinical Monitoring/Outcomes  Renal function, volume status (intake and output)    Results: Not applicable    Wt Readings from Last 1 Encounters:   06/02/18 90.7 kg (200 lb)     Creatinine   Date Value Ref Range Status   06/08/2018 0.77 0.70 - 1.30 mg/dL Final   16/09/9603 5.40 (L) 0.70 - 1.30 mg/dL Final   98/10/9146 8.29 0.70 - 1.30 mg/dL Final        Pharmacokinetic Considerations and Significant Drug Interactions:  ? Adult (estimated initial): Vd = 64.4 L, ke = 0.104 hr-1  ? Concurrent nephrotoxic meds: not applicable    Assessment/Plan:  Recommended Dose  ? Start vancomycin 1250 mg IV q8hrs.   ? Estimated trough on recommended regimen: 16 mg/L    Follow-up  ? Level due: prior to fourth or fifth dose  ? A pharmacist will continue to monitor and order levels as appropriate    Please page service pharmacist with questions/clarifications.    Lucienne Capers, PharmD

## 2018-06-08 NOTE — Unmapped (Signed)
Awaiting med verification from pharmacy.  

## 2018-06-08 NOTE — Unmapped (Signed)
Pt reports nausea, requests iv pherngan for nausea= md paged.

## 2018-06-08 NOTE — Unmapped (Signed)
[facility]   Emergency Department Provider Note    ED Final Clinical Impression     Final diagnoses:   Cough (Primary)         ED Course, Assessment and Plan     Initial Clinical Impression:    Cameron Baker is a 27 y.o. male with VATER syndrome status post trach/PEG, chronic tracheobronchitis, hospital-acquired pneumonia, achalasia who presents with 4-day history of increasing congestion, productive cough, and secretions requiring frequent suctioning.  No fever, chills, chest pain.  On exam, afebrile, mild tachycardia, vital signs stable, well-appearing, no apparent distress.  Lungs with coarse breath sounds bilaterally, no wheezes or crackles.  Patient is currently on 4 weeks of tobramycin, Monday Wednesday Friday azithromycin, and nightly minocycline.  He is also been compliant with his pulmonary toilet.  His symptoms have continued despite this medication.  Concern for possible aspiration pneumonia, acute on chronic tracheobronchitis, URI.  Will get labs, respiratory panel, blood cultures, CXR.      ED Course:    0500 - CXR reveals no acute airspace disease.  Labs unremarkable.  Given the patient's chronic respiratory conditions on multiple antibiotics and continued symptoms despite his antibiotics and pulmonary toilet, will admit for further evaluation and possible IV antibiotics.    0700 - Patient signed out to oncoming ED provider.  Currently awaiting admission but no bed available.  MAO aware.    I have discussed the case with the Attending Physician, Dr. Laurell Josephs who was in agreement with the plan as described above.  ____________________________________________    Time seen: June 08, 2018 4:13 AM     History     CHIEF COMPLAINT:   Chief Complaint   Patient presents with   ??? Cough       HPI:   Cameron Baker is a 27 y.o. male with PMH of VATER syndrome status post trach/PEG, chronic tracheobronchitis, hospital-acquired pneumonia, achalasia who presents to the ED for evaluation of cough.  Patient states his cough started 4 days ago.  He reports that the cough is productive with a rust color sputum.  Patient states that he is on his on month for treatment with tobramycin due to his chronic tracheobronchitis.  He also takes azithromycin Monday, Wednesday, and Friday as well as minocycline nightly.  Patient has also been using his pulmonary toilet including Mehta nebs, DuoNeb's, home trilogy, hypertonic saline.  Despite these medications, he feels like his symptoms are getting worse.  He reports needing to suction himself more than usual with increased secretions.  He notes his symptoms are more consistent with an upper respiratory infection, however he has been told by his pulmonologist when he has these symptoms while he is on tobramycin, he needs to be admitted to the hospital.  Patient denies any fevers or chills, chest pain.  No recent sick contacts.    ____________________________________________    PAST MEDICAL HISTORY/PAST SURGICAL HISTORY:   Past Medical History:   Diagnosis Date   ??? ADHD    ??? Asthma    ??? Bipolar 1 disorder (CMS-HCC)    ??? Chronic tracheobronchitis (CMS-HCC)    ??? Constipation    ??? De Quervain's tenosynovitis    ??? Dysphagia    ??? Esophageal dysmotility    ??? Esophageal stricture    ??? GERD (gastroesophageal reflux disease)    ??? hypothyroid    ??? Seizures (CMS-HCC)    ??? Tracheobronchomalacia    ??? VATER syndrome    ??? Ventilator dependence (CMS-HCC)  MEDICATIONS:   No current facility-administered medications for this encounter.     Current Outpatient Medications:   ???  acetaminophen (TYLENOL) 160 mg/5 mL (5 mL) suspension, 15.6 mL (500 mg total) by G-tube route every four (4) hours as needed for pain., Disp: 1 Bottle, Rfl: 0  ???  albuterol HFA 90 mcg/actuation inhaler, Inhale 2 puffs every six (6) hours as needed for wheezing., Disp: 1 Inhaler, Rfl: 11  ???  arformoterol (BROVANA), Inhale 2 mL (15 mcg total) by nebulization Two (2) times a day., Disp: 120 mL, Rfl: 11  ???  azithromycin (ZITHROMAX) 500 MG tablet, 1 tablet (500 mg total) by G-tube route 3 (three) times a week., Disp: 12 tablet, Rfl: 11  ???  budesonide (PULMICORT) 0.5 mg/2 mL nebulizer solution, Inhale 4 mL (1 mg total) by nebulization Two (2) times a day., Disp: 240 mL, Rfl: 11  ???  budesonide-formoterol (SYMBICORT) 160-4.5 mcg/actuation inhaler, Inhale 2 puffs Two (2) times a day., Disp: 1 Inhaler, Rfl: 11  ???  cetirizine (ZYRTEC) 10 MG tablet, 1 tablet (10 mg total) by G-tube route every evening., Disp: , Rfl: 0  ???  cholecalciferol, vitamin D3, (VITAMIN D3) 2,000 unit cap, 3 capsules (6,000 Units total) by G-tube route daily., Disp: , Rfl: 0  ???  citalopram (CELEXA) 40 MG tablet, 1 tablet (40 mg total) by G-tube route nightly., Disp: 30 tablet, Rfl: 2  ???  clindamycin (CLINDAGEL) 1 % gel, Apply topically Two (2) times a day., Disp: 60 g, Rfl: 6  ???  diclofenac sodium (VOLTAREN) 1 % gel, Apply 2 g topically Four (4) times a day. (Patient taking differently: Apply 2 g topically 4 (four) times a day as needed. ), Disp: 100 g, Rfl: 6  ???  dicyclomine (BENTYL) 10 mg capsule, 10 mg by G-tube route 4 (four) times a day as needed. , Disp: , Rfl:   ???  fluticasone (FLONASE) 50 mcg/actuation nasal spray, 2 sprays by Each Nare route daily. , Disp: , Rfl:   ???  gabapentin (NEURONTIN) 300 MG capsule, 300 mg by G-tube route Three (3) times a day., Disp: , Rfl:   ???  ibuprofen (ADVIL,MOTRIN) 600 MG tablet, 1 tablet (600 mg total) by G-tube route every six (6) hours as needed for pain., Disp: 30 tablet, Rfl: 1  ???  ipratropium-albuterol (DUO-NEB) 0.5-2.5 mg/3 mL nebulizer, Inhale 3 mL 4 (four) times a day. , Disp: , Rfl:   ???  levothyroxine (SYNTHROID, LEVOTHROID) 75 MCG tablet, 1 tablet (75 mcg total) by G-tube route daily., Disp: 90 tablet, Rfl: 3  ???  linaclotide (LINZESS) 290 mcg capsule, 1 capsule (290 mcg total) by G-tube route daily., Disp: 30 capsule, Rfl: 0  ???  LORazepam (ATIVAN) 0.5 MG tablet, Take 1 tablet (0.5mg ) twice daily and 1 tablet (0.5mg ) daily as needed for anxiety via G-tube, Disp: 70 tablet, Rfl: 2  ???  minocycline (MINOCIN,DYNACIN) 100 MG capsule, 1 capsule (100 mg total) by G-tube route nightly., Disp: 90 capsule, Rfl: 3  ???  mirtazapine (REMERON) 15 MG tablet, 1 tablet (15 mg total) by G-tube route nightly., Disp: 30 tablet, Rfl: 2  ???  montelukast (SINGULAIR) 10 mg tablet, Take 1 tablet (10 mg total) by mouth nightly., Disp: 30 tablet, Rfl: 11  ???  omeprazole (PRILOSEC) 20 MG capsule, 20 mg Two (2) times a day (30 minutes before a meal). Via G-tube, Disp: , Rfl:   ???  oxyCODONE (ROXICODONE) 5 MG immediate release tablet, 5 mg by G-tube route every  four (4) hours as needed for pain. , Disp: , Rfl:   ???  PARI LC D NEBULIZER Misc, Provide 1 device  for use with inhaled medication., Disp: 2 each, Rfl: 11  ???  polyethylene glycol (MIRALAX) 17 gram packet, 17 g by G-tube route daily as needed., Disp: 20 packet, Rfl: 0  ???  promethazine (PHENERGAN) 6.25 mg/5 mL syrup, Take 10 mL (12.5 mg total) by mouth 4 (four) times a day as needed for nausea. for up to 7 days, Disp: 120 mL, Rfl: 0  ???  sodium chloride 3 % nebulizer solution, Inhale 4 mL by nebulization 4 (four) times a day., Disp: 480 mL, Rfl: 1  ???  tiZANidine (ZANAFLEX) 4 MG tablet, Take 4 mg by mouth every twelve (12) hours as needed., Disp: , Rfl:   ???  tobramycin, PF, (TOBI) 300 mg/5 mL nebulizer solution, Inhale 5 mL (300 mg total) by nebulization every twelve (12) hours. Every other month, Disp: 280 mL, Rfl: 5  ???  triamcinolone (KENALOG) 0.1 % cream, Apply topically Two (2) times a day., Disp: 15 g, Rfl: 11  ???  valproate (DEPAKENE) 250 mg/5 mL syrup, TAKE BY G-TUBE EVERY MORNING AND EVERY EVENING, Disp: 1850 mL, Rfl: 0    ALLERGIES:   Xopenex [levalbuterol hcl]; Allopurinol analogues; and Versed [midazolam]    SOCIAL HISTORY:   Social History     Tobacco Use   ??? Smoking status: Never Smoker   ??? Smokeless tobacco: Never Used   Substance Use Topics   ??? Alcohol use: Yes     Alcohol/week: 1.2 oz Types: 2 Cans of beer per week     Comment: on weekends sometimes       FAMILY HISTORY:  Family History   Adopted: Yes        ____________________________________________    Review of Systems  See HPI  Constitutional: no fever, no chills   Eyes: no drainage   ENT: + nasal congestion, no sore throat  Cardiovascular:  no chest pain   Resp: no SOB, + cough   Gastrointestinal: no nausea, no vomiting, no diarrhea  Genitourinary: no dysuria  Integumentary: no rash   Allergy: no hives  Musculoskeletal: no leg swelling, no joint pain   Neurological: no slurred speech, no headache  A 10 point review of systems was performed and is negative other than positive elements noted in HPI     Physical Exam     VITAL SIGNS:    BP 133/88  - Pulse 102  - Temp 36.9 ??C (98.5 ??F) (Tympanic)  - Resp 18  - SpO2 99%     Constitutional: Alert and oriented. Well appearing and in no distress.  Head: Normocephalic and atraumatic  Eyes: EOMI, PERRL, conjunctiva normal  ENT: External ears normal, bilateral nares clear, MMM  Cardiovascular: Normal rate, regular rhythm. Normal and symmetric distal pulses are present in all extremities.  Respiratory: Normal respiratory effort. Breath sounds are course.  Gastrointestinal: Soft, non-distended and nontender. There is no CVA tenderness.  Musculoskeletal: Nontender with normal range of motion in all extremities.       Right lower leg: No tenderness or edema.       Left lower leg: No tenderness or edema.  Neurologic: Normal speech and language. No gross focal neurologic deficits are appreciated.  Skin: Skin is warm, dry and intact. No rash noted.  Psychiatric: Mood and affect are normal. Speech and behavior are normal.      EKG  Radiology     XR Chest 2 views   Preliminary Result      No acute airspace disease.              Pertinent labs & imaging results that were available during my care of the patient were reviewed by me and considered in my medical decision making. Labs and radiology studies included in my note may not constitute all ordered/reviewied labs during this encounter (see chart for details).    Portions of this record have been created using Scientist, clinical (histocompatibility and immunogenetics). Dictation errors have been sought, but may not have been identified and corrected.     Jacelyn Grip, MD  Resident  06/08/18 [phone]

## 2018-06-08 NOTE — Unmapped (Signed)
Pt back from xray. NAD noted.   Results pending.

## 2018-06-08 NOTE — Unmapped (Signed)
Report called to floor= Anna= pt taken to floor via stretecher w mr,.

## 2018-06-08 NOTE — Unmapped (Signed)
Pt awake, alert, nad. Pt awaiting a house bed, pt seen prior by rt.

## 2018-06-08 NOTE — Unmapped (Signed)
Pt in nad. Pt texting, on his cell phone, awaiting a house bed.

## 2018-06-08 NOTE — Unmapped (Signed)
md paged for nausea med.

## 2018-06-08 NOTE — Unmapped (Signed)
Pt ringing call bell= reports pain , pt awaiting med verificaton of meds .

## 2018-06-08 NOTE — Unmapped (Signed)
Pt asked, declines  ngt meds until he gets nausea meds, will re page md.

## 2018-06-08 NOTE — Unmapped (Signed)
Patient rounds completed. The following patient needs were addressed:  Pain, Positioning;   RIGHT side, Plan of Care, Call Bell in Reach and Bed Position Low .

## 2018-06-08 NOTE — Unmapped (Signed)
Pt reports no difference in his pain. Pt awaiting a house bed.

## 2018-06-08 NOTE — Unmapped (Signed)
Cameron Baker to check on pt, pt sleeping , dozing when observed. Pt awaiting a house bed.

## 2018-06-08 NOTE — Unmapped (Signed)
Patient rounds completed. The following patient needs were addressed:  Pain, Toileting, Personal Belongings, Plan of Care, Call Bell in Reach and Bed Position Low .  Pt in NAD, no resp. sxs noted with assessment.   Will cont. To monitor.

## 2018-06-08 NOTE — Unmapped (Addendum)
Cameron Bakeris a 27 y.o.??male??with history of VATER syndrome??(s/p trach/peg)??w/ frequent admissions for??chronic??tracheobronchitis (most recently from 6/15-6/17), hypothyroidism, asthma,??bipolar 1 disorder, and acne presenting with repeat tracheobronchitis.     Tracheobronchitis: Cameron Baker has a history of VATER syndrome-associated tracheobronchomalacia with tracheostomy placement and nocturnal mechanical ventilation. He has had multiple hospitalizations for chronic tracheobronchitis, most recently from 6/15-6/17. LR culture from 6/17 grew 2+ gram positive bacilli/1+ gram positive cocci, and he was treated with IV cefepime/vancomycin, and then was transitioned to oral levofloxacin which was completed 6/24. However, he developed increasing respiratory symptoms of cough, congestion, increased secretions at home, consistent with another episode of tracheobronchitis, possibly a recurrence due to insufficient abx duration or coverage.??On admission (7/2), flu/RPP negative, LR Cx grew only OP flora, and blood cultures were negative after 4 days. Admission CXR unremarkable. Initial abx coverage of cefepime, vancomycin, and metronidazole was d/c'ed (7/2-7/4) when admission LRCx showed only oropharyngeal flora and he was started on Unasyn IV on 7/4 to complete a 14 day course, planned to end on 7/18. He also underwent airway clearance by respiratory therapy with metaneb and 3% saline nebs. He was continued on home albuterol/Atrovent, azithromycin??500 mg MWF, budesonide??nebs, Zyrtec??10 mg, Singulair, and inhaled TOBI. He required IV Phenergan 12.5 mg q6h PRN for nausea with med administration. He will be transferred to [facility] to complete course of IV Unasyn and to undergo port placement there before discharge.   ??  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  Bipolar 1 Disorder/PTSD: Continue home Celexa, Valproate, Remeron   ??  Hypothyroidism: Continue home Synthroid   ??  Acne: Continue home minocycline

## 2018-06-08 NOTE — Unmapped (Signed)
Pt medicated w available meds in the er. Pt reports his nausea is better. Pt tolerated ngt meds wo incident. Pt declines breakfast.

## 2018-06-08 NOTE — Unmapped (Signed)
Report given to [place], Charity fundraiser. Patient care transferred at this time.

## 2018-06-09 LAB — GAMMAGLOBULIN; IGG: IgG:MCnc:Pt:Ser/Plas:Qn:: 715

## 2018-06-09 LAB — CBC
HEMOGLOBIN: 12.3 g/dL — ABNORMAL LOW (ref 13.5–17.5)
MEAN CORPUSCULAR HEMOGLOBIN CONC: 32.5 g/dL (ref 31.0–37.0)
MEAN CORPUSCULAR HEMOGLOBIN: 24.2 pg — ABNORMAL LOW (ref 26.0–34.0)
MEAN CORPUSCULAR VOLUME: 74.6 fL — ABNORMAL LOW (ref 80.0–100.0)
MEAN PLATELET VOLUME: 7.1 fL (ref 7.0–10.0)
PLATELET COUNT: 379 10*9/L (ref 150–440)
RED BLOOD CELL COUNT: 5.08 10*12/L (ref 4.50–5.90)
RED CELL DISTRIBUTION WIDTH: 15.9 % — ABNORMAL HIGH (ref 12.0–15.0)
WBC ADJUSTED: 6.6 10*9/L (ref 4.5–11.0)

## 2018-06-09 LAB — BASIC METABOLIC PANEL
ANION GAP: 12 mmol/L (ref 9–15)
BUN / CREAT RATIO: 14
CALCIUM: 9.7 mg/dL (ref 8.5–10.2)
CHLORIDE: 102 mmol/L (ref 98–107)
CO2: 23 mmol/L (ref 22.0–30.0)
CREATININE: 0.8 mg/dL (ref 0.70–1.30)
EGFR CKD-EPI AA MALE: >90 mL/min/{1.73_m2} (ref >=60)
EGFR CKD-EPI NON-AA MALE: >90 mL/min/{1.73_m2} (ref >=60)
GLUCOSE RANDOM: 111 mg/dL (ref 65–179)
POTASSIUM: 4.3 mmol/L (ref 3.5–5.0)
SODIUM: 137 mmol/L (ref 135–145)

## 2018-06-09 LAB — VANCOMYCIN RANDOM: Vancomycin^random:MCnc:Pt:Ser/Plas:Qn:: 18.3

## 2018-06-09 LAB — CO2: Carbon dioxide:SCnc:Pt:Ser/Plas:Qn:: 23

## 2018-06-09 LAB — MAGNESIUM: Magnesium:MCnc:Pt:Ser/Plas:Qn:: 1.6

## 2018-06-09 LAB — RED BLOOD CELL COUNT: Lab: 5.08

## 2018-06-09 NOTE — Unmapped (Signed)
Medicine Daily Progress Note (Day 1)    Interval History:   - No acute events overnight. Continues on cefepime, vanc, and metronidazole for tracheobronchitis treatment. He feels respiratory symptoms have slightly improved since admission.     Assessment/Plan:  Principal Problem:    Chronic tracheobronchitis (CMS-HCC)  Active Problems:    Hypothyroidism    Mood disorder (CMS-HCC)    VATER syndrome  Resolved Problems:    * No resolved hospital problems. *           Cameron Baker??is a 27 y.o.??male??with history of VATER syndrome (s/p trach/peg)??w/ frequent admissions for??chronic??tracheobronchitis, hypothyroidism, asthma,??bipolar 1 disorder, acne on minocycline, recent hospitalization 6/15-6/17 for tracheobronchitis presenting with repeat tracheobronchitis.     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 Multiple hospitalizations for chronic tracheobronchitis, most recently from 6/15-6/17. LR culture 6/17 grew 2+ gram positive bacilli/1+ gram positive cocci, and he was treated with IV cefepime/vancomycin then was transitioned to oral levofloxacin which was completed 6/24. Developed increasing respiratory symptoms of cough, congestion, increased secretions. Flu/RPP negative, blood cultures negative at 24 hours, CXR unremarkable. Symptoms are consistent with his known tracheobronchitis, possibly a recurrence due to insufficient abx duration or coverage.   - Prelim results of LR culture 7/2 showed 1+ gram positive bacilli, culture pending  - Continue IV cefepime, vancomycin, and metronidazole (7/2-) and taper pending sensitivity results  - Airway clearance per RT  - Will speak with Dr. Garner Nash on possibility of port placement given patient's recent repeated hospitalizations for IV abx, and will coordinate with case management on financial planning for home IV abx if necessary   - Continue home meds of albuterol/Atrovent, azithromycin 500 mg MWF, budesonide nebs, Zyrtec 10 mg, Singulair, TOBI   - Continue IV phenergan 12.5 mg q6h PRN for nausea wih med administration    Type II Achlasia 2/2 VATER syndrome s/p recent EGD dilation:   - Continue protnix, Gabapentin    Bipolar 1 Disorder/PTSD:   - Continue home Celexa, Valproate, Remeron     Hypothyroidism:   - Continue home synthroid     Acne:   - Continue home minocycline     FEN/GI:   - Regular diet     Ppx:   - Lovenox 40mg  SQ daily     Access: PIV     Code status: Full     ___________________________________________________________________    Labs/Studies:  Labs and Studies from the last 24hrs per EMR and Reviewed    Pressure Ulcer(s)    Active Pressure Ulcer     None                Objective:  Temp:  [35.8 ??C (96.5 ??F)-36.9 ??C (98.5 ??F)] 36.9 ??C (98.4 ??F)  Heart Rate:  [58-99] 93  SpO2 Pulse:  [61-101] 101  Resp:  [12-25] 14  BP: (104-145)/(59-100) 138/63  FiO2 (%):  [28 %] 28 %  SpO2:  [96 %-100 %] 99 %    GEN: Alert, awake, lying in bed in no acute distress.   EYES: EOMI  ENT: Moist mucous membranes.   CV: Regular rate, rhythm, no murmurs/gallops/rubs appreciated.   PULM: Trach in place. Diffuse inspiratory rhonchi, no apparent crackles. No increased work of breathing.    ABD: soft, NT/ND  EXT: No peripheral edema, moving all extremities equally.       Felicie Morn, MS3     I attest that I have reviewed the student note and that the components of  the history of the present illness, the physical exam, and the assessment and plan documented were performed by me or were performed in my presence by the student where I verified the documentation and performed (or re-performed) the exam and medical decision making.   Leafy Kindle, PGY-1 Medicine

## 2018-06-09 NOTE — Unmapped (Signed)
Care Management  Initial Transition Planning Assessment    Per H&P: Cameron Baker??is a 27 y.o.??male??with history of VATER syndrome s/p trach/peg??w/ frequent admissions for??chronic??tracheobronchitis, hypothyroidism, asthma,??bipolar 1 disorder, acne on minocycline, recent hospitalization 6/13-6/14 for new type II achalasia s/p esophageal dilation who re-presents to ED                   General  Care Manager assessed the patient by : Medical record review, Discussion with Clinical Care team  Orientation Level: Oriented X4    Contact/Decision Maker        Extended Emergency Contact Information  Primary Emergency Contact: [name] [place] of [place]  Home Phone: [phone]  Mobile Phone: [phone]  Relation: Mother    Legal Next of Kin / Guardian / POA / Advance Directives       Advance Directive (Medical Treatment)  Does patient have an advance directive covering medical treatment?: Patient does not have advance directive covering medical treatment.  Reason patient does not have an advance directive covering medical treatment:: Patient does not wish to complete one at this time  Reason there is not a Health Care Decision Maker appointed:: Patient does not wish to appoint a Health Care Decision Maker at this time  Information provided on advance directive:: Yes  Patient requests assistance:: No    Advance Directive (Mental Health Treatment)  Does patient have an advance directive covering mental health treatment?: Patient does not have advance directive covering mental health treatment.  Reason patient does not have an advance directive covering mental health treatment:: Patient does not wish to complete one at this time.    Patient Information  Lives with: Alone    Type of Residence: Private residence     Type of Residence: Mailing Address:  [address] [place] [address]  Contacts: Accompanied by: Alone  Password: [id number]  Patient Phone Number: [phone] (home) [phone] (work) Medical Provider(s): Ander Slade, MD  Reason for Admission: Admitting Diagnosis:  Cough [R05]  Past Medical History:   has a past medical history of ADHD, Asthma, Bipolar 1 disorder (CMS-HCC), Chronic tracheobronchitis (CMS-HCC), Constipation, De Quervain's tenosynovitis, Dysphagia, Esophageal dysmotility, Esophageal stricture, GERD (gastroesophageal reflux disease), hypothyroid, Seizures (CMS-HCC), Tracheobronchomalacia, VATER syndrome, and Ventilator dependence (CMS-HCC).  Past Surgical History:   has a past surgical history that includes Tracheostomy (1994); Hip surgery (Right, 01/19/2017); Wrist surgery; Nissen fundoplication (2001); Anal dilation (01/21/2006); Portacath placement (01/21/2006); REMOVAL VENOUS ACCESS PORT [facility] HISTORICAL RESULT) (11/03/2006); Wisdom tooth extraction (04/2012); Lateral rectus recession (11/15/2003); Strabismus surgery (Right); Esophagogastroduodenoscopy; pr up gi endoscopy,ball dil,75mm (N/A, 02/22/2018); pr esophageal motility study, manometry (N/A, 04/23/2018); and pr up gi endoscopy,ball dil,26mm (N/A, 05/20/2018).   Previous admit date: 05/22/2018    Primary Insurance- Payor: Advertising copywriter MEDICARE ADV / Plan: UNITED HEALTHCARE DUAL COMPLETE HMO / Product Type: *No Product type* /   Secondary Insurance ??? None  Prescription Coverage ??? yes  Preferred Pharmacy - [id number] - [place], [place] - [address] AT [facility] [address]  [facility] [phone] - , [place] - [address]  [facility] - [place], [place] - [address]    Transportation home: Private vehicle  Level of function prior to admission: Independent      Support Systems: Family Members    Responsibilities/Dependents at home?: No    Home Care services in place prior to admission?: No  Type of  Home Care services in place prior to admission: Home health (specify)  Current Home Care provider (Name/Phone #): [facility] ([phone], F: [phone]). Pt reports he has private duty nursing Wed-Sat 7pm-7am; Sunday 11pm-7am with no nursing Monday or Tuesday because that's when he goes out    Outpatient/Community Resources in place prior to admission: Clinic       Equipment Currently Used at Home: respiratory supplies(Chest vest, portable suction machine, home vent, nebulizer)  Current HME Agency (Name/Phone #): [facility]     Currently receiving outpatient dialysis?: No       Financial Information       Need for financial assistance?: No       Social Determinants of Health  Social Determinants of Health were addressed in provider documentation.  Please refer to patient history.    Discharge Needs Assessment  Concerns to be Addressed: discharge planning    Clinical Risk Factors: Multiple Diagnoses (Chronic), Readmission < 30 Days    Barriers to taking medications: No    Prior overnight hospital stay or ED visit in last 90 days: Yes    Readmission Within the Last 30 Days: previous discharge plan unsuccessful         Anticipated Changes Related to Illness: none    Equipment Needed After Discharge: none    Discharge Facility/Level of Care Needs: (home to resume PDN)    Readmission  Risk of Unplanned Readmission Score: UNPLANNED READMISSION SCORE: 71%  Readmitted Within the Last 30 Days? Yes  Patient at risk for readmission?: Yes    Discharge Plan  Screen findings are: Discharge planning needs identified or anticipated (Comment).    Expected Discharge Date:      Expected Transfer from Critical Care:      Patient and/or family were provided with choice of facilities / services that are available and appropriate to meet post hospital care needs?: Yes       Initial Assessment complete?: Yes

## 2018-06-09 NOTE — Unmapped (Signed)
Pt came to ER for SOB, cough and increasing tracheostomy secretion. Pt is AAO x 4, currently on room air. Denies any pain. Orders reviewed. Looks asymptomatic, and stable vitals. Fall risk maintained. Keep monitoring.  Problem: Adult Inpatient Plan of Care  Goal: Plan of Care Review  Outcome: Progressing  Goal: Patient-Specific Goal (Individualization)  Outcome: Progressing  Goal: Absence of Hospital-Acquired Illness or Injury  Outcome: Progressing  Goal: Optimal Comfort and Wellbeing  Outcome: Progressing  Goal: Readiness for Transition of Care  Outcome: Progressing  Goal: Rounds/Family Conference  Outcome: Progressing     Problem: Venous Thromboembolism  Goal: VTE (Venous Thromboembolism) Symptom Resolution  Outcome: Progressing     Problem: Fall Injury Risk  Goal: Absence of Fall and Fall-Related Injury  Outcome: Progressing     Problem: Breathing Pattern Ineffective  Goal: Effective Breathing Pattern  Outcome: Progressing

## 2018-06-09 NOTE — Unmapped (Signed)
Pt alert to drowsy.  Oriented x4.  No c/o pain this shift.  Up ad lib in room, no difficulty noted.  Pt remains afebrile, bp stable, NSR to SB on monitor and sats maintained at or above goal on room air while awake and vent while sleeping.   Continues on IV antibiotics.  Will cont to monitor.    Problem: Adult Inpatient Plan of Care  Goal: Plan of Care Review  Outcome: Progressing  Goal: Absence of Hospital-Acquired Illness or Injury  Outcome: Progressing  Goal: Optimal Comfort and Wellbeing  Outcome: Progressing     Problem: Fall Injury Risk  Goal: Absence of Fall and Fall-Related Injury  Outcome: Progressing     Problem: Asthma Comorbidity  Goal: Maintenance of Asthma Control  Outcome: Progressing     Problem: Communication Impairment (Mechanical Ventilation, Invasive)  Goal: Effective Communication  Outcome: Progressing     Problem: Ventilator-Induced Lung Injury (Mechanical Ventilation, Invasive)  Goal: Absence of Ventilator-Induced Lung Injury  Outcome: Progressing

## 2018-06-09 NOTE — Unmapped (Signed)
Order was placed for PIV by Venous Access Team (VAT). Patients' current right hand PIV/saline lock is flushing well with no redness, swelling or pain at the site. Has a great blood return. Secured well on top of hand per patient request. Informed RN of these findings.       Workup / Procedure Time:  15 minutes      The primary RN was notified.       Thank you,     Elizabeth Palau RN Venous Access Team

## 2018-06-10 LAB — BASIC METABOLIC PANEL
BLOOD UREA NITROGEN: 7 mg/dL (ref 7–21)
CALCIUM: 8.9 mg/dL (ref 8.5–10.2)
CHLORIDE: 102 mmol/L (ref 98–107)
CO2: 27 mmol/L (ref 22.0–30.0)
CREATININE: 0.63 mg/dL — ABNORMAL LOW (ref 0.70–1.30)
EGFR CKD-EPI AA MALE: >90 mL/min/{1.73_m2} (ref >=60)
EGFR CKD-EPI NON-AA MALE: >90 mL/min/{1.73_m2} (ref >=60)
GLUCOSE RANDOM: 120 mg/dL (ref 65–179)
POTASSIUM: 4.1 mmol/L (ref 3.5–5.0)
SODIUM: 138 mmol/L (ref 135–145)

## 2018-06-10 LAB — BUN / CREAT RATIO: Urea nitrogen/Creatinine:MRto:Pt:Ser/Plas:Qn:: 11

## 2018-06-10 LAB — VANCOMYCIN RANDOM: Vancomycin^random:MCnc:Pt:Ser/Plas:Qn:: 12.7

## 2018-06-10 NOTE — Unmapped (Signed)
Pt alert and oriented x4. C/o right rib pain paritially relieved by prn oxcoone.  Up ad lib in room, no difficulty noted.  Pt ambulated at least 3 labs aourd paitnePt remains afebrile, bp stable, NSR to SB on monitor and sats maintained at or above goal on room air while awake and vent while sleeping.   Continues on IV antibiotics.  Will cont to monitor.  ??      Problem: Adult Inpatient Plan of Care  Goal: Plan of Care Review  Outcome: Progressing

## 2018-06-10 NOTE — Unmapped (Signed)
Med G Daily Progress Note    Subjective/Interval History:  - No acute events overnight, but pt was complaining of some rib pain, so was ordered for home oxycodone 5 mg q4h PRN  - Stopped cefepime, vancomycin, and metronidazole. Starting Unasyn  - IgG 715    Assessment/Plan:    Principal Problem:    Chronic tracheobronchitis (CMS-HCC)  Active Problems:    Hypothyroidism    Mood disorder (CMS-HCC)    VATER syndrome  Resolved Problems:    * No resolved hospital problems. *        Pressure Ulcer(s)    Active Pressure Ulcer     None                 Hospital Day 2     Cameron Baker??is a 27 y.o.??male??with history of VATER syndrome (s/p trach/peg)??w/ frequent admissions for??chronic??tracheobronchitis, hypothyroidism, asthma,??bipolar 1 disorder, acne on minocycline, recent hospitalization 6/15-6/17 for tracheobronchitis presenting with repeat tracheobronchitis.   ??  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 Multiple hospitalizations for chronic tracheobronchitis, most recently from 6/15-6/17. LR culture 6/17 grew 2+ gram positive bacilli/1+ gram positive cocci, and he was treated with IV cefepime/vancomycin then was transitioned to oral levofloxacin which was completed 6/24. Developed increasing respiratory symptoms of cough, congestion, increased secretions. Flu/RPP negative, blood cultures negative at 24 hours, CXR unremarkable. Symptoms are consistent with his known tracheobronchitis, possibly a recurrence due to insufficient abx duration or coverage. Admission LR Cx 7/2 growing only oropharyngeal flora.   - D/c IV cefepime, vancomycin, and metronidazole (7/2-7/3)  - Start Unasyn 3g q6h (7/4-)  - Airway clearance per RT  - Continue home meds of albuterol/Atrovent, azithromycin 500 mg MWF, budesonide nebs, Zyrtec 10 mg, Singulair, TOBI   - Continue IV phenergan 12.5 mg q6h PRN for nausea wih med administration  ??  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  Bipolar 1 Disorder/PTSD:   - Continue home Celexa, Valproate, Remeron   ??  Hypothyroidism:   - Continue home synthroid   ??  Acne:   - Continue home minocycline   ??  FEN/GI:   - Regular diet   ??  Ppx:   - Lovenox 40mg  SQ daily   ??  Access: PIV   ??  Code status: Full       ___________________________________________________________________    Labs/Studies:  Labs and Studies from the last 24hrs per EMR and Reviewed    Objective:  Temp:  [36.2 ??C-36.9 ??C] 36.2 ??C  Heart Rate:  [70-110] 93  SpO2 Pulse:  [81-89] 81  Resp:  [10-25] 11  BP: (125-153)/(57-93) 150/79  FiO2 (%):  [28 %] 28 %  SpO2:  [97 %-100 %] 100 %    Physical Exam:  General: Well-appearing M with trach connected to vent, resting comfortably, in no acute distress.  CV: RRR, no murmurs.  Lungs: Normal work of breathing, diffuse inspiratory rhonchi, no crackles  Abdomen: Soft, non-tender, non-distended  Extremities: No LE edema.     [name], MD  Internal Medicine PGY-1  06/10/18 6:58 AM

## 2018-06-10 NOTE — Unmapped (Signed)
Vancomycin Therapeutic Monitoring Pharmacy Note  Cameron Baker is a 27 y.o. male       Indication: Pneumonia in vent dependant patient    Prior Dosing Information: Previous regimen 1250 mg q8hrs - 1500 mg q8hrs  (other hospital stays)    Goals:  Therapeutic Drug Levels  Vancomycin trough goal: 15-20 mg/L    Additional Clinical Monitoring/Outcomes  Renal function, volume status (intake and output)    Results:    Wt Readings from Last 1 Encounters:   06/08/18 94.1 kg (207 lb 7.3 oz)     Creatinine   Date Value Ref Range Status   06/09/2018 0.80 0.70 - 1.30 mg/dL Final   96/29/5284 1.32 0.70 - 1.30 mg/dL Final   44/12/270 5.36 (L) 0.70 - 1.30 mg/dL Final      VANCOMYCIN:   7/3/9 vanc = 18. 46mcg/mL at 1138 (~ 4h after infusion) - infusion time NOT recorded   --  It should be noted on 7/2 when vanc started there was 12h difference between dose#1 - #2 and ~ 8h between #2-#3, patient not at steady state.  True trough may be slightly less than goal however given missed dose at q8h freq, Repeat level prior to making dose adjustment.    Pharmacokinetic Considerations and Significant Drug Interactions:  ? Adult (estimated initial): Vd = 64.4 L, ke = 0.104 hr-1  ? Concurrent nephrotoxic meds: not applicable    Assessment/Plan:  Recommended Dose  ? Continue Vanc 1.25g q8h  ? Estimated trough on recommended regimen: 16 mg/L    Follow-up  ? Level due: prior to fourth or fifth dose, sooner if going thru central line  ? A pharmacist will continue to monitor and order levels as appropriate    Please page service pharmacist with questions/clarifications.    Drue Flirt, PharmD

## 2018-06-10 NOTE — Unmapped (Signed)
.              VENOUS ACCESS ULTRASOUND PROCEDURE NOTE    Indications:   Poor venous access.    The Venous Access Team has assessed this patient for the placement of a PIV. Ultrasound guidance was necessary to obtain access.     Procedure Details:  Risks, benefits and alternatives discussed with patient. Identity of the patient was confirmed via name, medical record number and date of birth. The availability of the correct equipment was verified.    The vein was identified for ultrasound catheter insertion.  Field was prepared with necessary supplies and equipment.  Probe cover and sterile gel utilized.  Insertion site was prepped with chlorhexidine solution and allowed to dry.  The catheter extension was primed with normal saline.A(n) 20 g x 2.25 inch (this is a 29 day dwell, power injectable) catheter was placed in the left forearm with 1 attempt(s).     Catheter aspirated, 2 mL blood return present. The catheter was then flushed with 20 mL of normal saline. Insertion site cleansed, and dressing applied per manufacturer guidelines. The catheter was inserted without difficulty  by Cristal Deer RN.    The primary RN was notified.     Thank you,     Cristal Deer RN Venous Access Team   [phone]     Workup / Procedure Time:  30 minutes    See vein image below:

## 2018-06-11 LAB — BASIC METABOLIC PANEL
BUN / CREAT RATIO: 13
CALCIUM: 9.1 mg/dL (ref 8.5–10.2)
CHLORIDE: 104 mmol/L (ref 98–107)
CO2: 28 mmol/L (ref 22.0–30.0)
CREATININE: 0.55 mg/dL — ABNORMAL LOW (ref 0.70–1.30)
EGFR CKD-EPI AA MALE: >90 mL/min/{1.73_m2} (ref >=60)
EGFR CKD-EPI NON-AA MALE: >90 mL/min/{1.73_m2} (ref >=60)
GLUCOSE RANDOM: 119 mg/dL (ref 65–179)
POTASSIUM: 4.4 mmol/L (ref 3.5–5.0)
SODIUM: 139 mmol/L (ref 135–145)

## 2018-06-11 LAB — CBC
HEMOGLOBIN: 11.6 g/dL — ABNORMAL LOW (ref 13.5–17.5)
MEAN CORPUSCULAR HEMOGLOBIN CONC: 32.3 g/dL (ref 31.0–37.0)
MEAN CORPUSCULAR HEMOGLOBIN: 24.6 pg — ABNORMAL LOW (ref 26.0–34.0)
MEAN CORPUSCULAR VOLUME: 76.3 fL — ABNORMAL LOW (ref 80.0–100.0)
MEAN PLATELET VOLUME: 7.2 fL (ref 7.0–10.0)
RED BLOOD CELL COUNT: 4.71 10*12/L (ref 4.50–5.90)
RED CELL DISTRIBUTION WIDTH: 15.6 % — ABNORMAL HIGH (ref 12.0–15.0)
WBC ADJUSTED: 6.8 10*9/L (ref 4.5–11.0)

## 2018-06-11 LAB — CREATININE: Creatinine:MCnc:Pt:Ser/Plas:Qn:: 0.55 — ABNORMAL LOW

## 2018-06-11 LAB — HEMATOCRIT: Lab: 35.9 — ABNORMAL LOW

## 2018-06-11 LAB — MAGNESIUM: Magnesium:MCnc:Pt:Ser/Plas:Qn:: 1.6

## 2018-06-11 NOTE — Unmapped (Signed)
Med G Daily Progress Note    Subjective/Interval History:  - No acute events overnight and no complaints this morning, feels secretions have somewhat improved. Continue Unasyn for now.  - HH can do port care, so will c/s VIR for Mon/Tues Port    Assessment/Plan:    Principal Problem:    Chronic tracheobronchitis (CMS-HCC)  Active Problems:    Hypothyroidism    Mood disorder (CMS-HCC)    VATER syndrome  Resolved Problems:    * No resolved hospital problems. *        Pressure Ulcer(s)    Active Pressure Ulcer     None                 Hospital Day 3     Cameron Baker??is a 27 y.o.??male??with history of VATER syndrome (s/p trach/peg)??w/ frequent admissions for??chronic??tracheobronchitis, hypothyroidism, asthma,??bipolar 1 disorder, acne on minocycline, recent hospitalization 6/15-6/17 for tracheobronchitis presenting with repeat tracheobronchitis.   ??  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 Multiple hospitalizations for chronic tracheobronchitis, most recently from 6/15-6/17. LR culture 6/17 grew 2+ gram positive bacilli/1+ gram positive cocci, and he was treated with IV cefepime/vancomycin then was transitioned to oral levofloxacin which was completed 6/24. Developed increasing respiratory symptoms of cough, congestion, increased secretions. On admission, flu/RPP negative, blood cultures negative at 24 hours, CXR unremarkable. Symptoms are consistent with his known tracheobronchitis, possibly a recurrence due to insufficient abx duration or coverage. Initial abx coverage of cefepime, vancomycin, and metronidazole was d/c'ed (7/2-7/4) when admission LRCx showed only oropharyngeal flora and he was started on Unasyn IV on 7/4.   - Continue Unasyn x 14 days (7/4)  - Airway clearance per RT  - Coordinate with case management on possibility of port placement.   - Continue home meds of albuterol/Atrovent, azithromycin 500 mg MWF, budesonide nebs, Zyrtec 10 mg, Singulair, TOBI   - Continue IV phenergan 12.5 mg q6h PRN for nausea with med administration  ??  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  Bipolar 1 Disorder/PTSD:   - Continue home Celexa, Valproate, Remeron   ??  Hypothyroidism:   - Continue home Synthroid   ??  Acne:   - Continue home minocycline   ??  FEN/GI:   - Regular diet   ??  Ppx:   - Lovenox 40mg  SQ daily   ??  Access: PIV   ??  Code status: Full       ___________________________________________________________________    Labs/Studies:  Labs and Studies from the last 24hrs per EMR and Reviewed    Objective:  Temp:  [36.6 ??C (97.8 ??F)-36.9 ??C (98.5 ??F)] 36.7 ??C (98.1 ??F)  Heart Rate:  [72-102] 86  SpO2 Pulse:  [86-92] 92  Resp:  [10-18] 10  BP: (119-151)/(47-79) 119/49  FiO2 (%):  [28 %] 28 %  SpO2:  [96 %-100 %] 99 %    Physical Exam:  General: Initially sleeping, arouses without difficult. Alert, well appearing without any signs of distress. Trach in place.  CV: Regular rate/rhythm, no murmurs/gallops/rubs appreciated.   Lungs: Diffuse inspiratory rhonchi unchanged from prior exams. No crackles appreciated. No increased work of breathing.  Extremities: No LE edema.     Lind Covert, MS3  25/05/19 11:16 AM    I attest that I have reviewed the student note and that the components of the history of the present illness, the physical  exam, and the assessment and plan documented were performed by me or were performed in my presence by the student where I verified the documentation and performed (or re-performed) the exam and medical decision making.   Max Shelitha Magley  Internal Medicine PGY-2  Pager: [phone]

## 2018-06-11 NOTE — Unmapped (Addendum)
Patient A&O x4, SR on the monitor, on RA during the day and on the Vent at night, other VSS. Pt ambulatory, walks in the room and around the unit without any difficulties. IV Abx given per schedule, PRN phenergan given x2 for nausea. Fall and skin precautions in place. No falls or injuries during the night. Will continue to monitor.    Problem: Adult Inpatient Plan of Care  Goal: Plan of Care Review  Outcome: Progressing  Goal: Patient-Specific Goal (Individualization)  Outcome: Progressing  Goal: Absence of Hospital-Acquired Illness or Injury  Outcome: Progressing  Goal: Optimal Comfort and Wellbeing  Outcome: Progressing  Goal: Readiness for Transition of Care  Outcome: Progressing  Goal: Rounds/Family Conference  Outcome: Progressing     Problem: Venous Thromboembolism  Goal: VTE (Venous Thromboembolism) Symptom Resolution  Outcome: Progressing     Problem: Fall Injury Risk  Goal: Absence of Fall and Fall-Related Injury  Outcome: Progressing     Problem: Breathing Pattern Ineffective  Goal: Effective Breathing Pattern  Outcome: Progressing     Problem: Asthma Comorbidity  Goal: Maintenance of Asthma Control  Outcome: Progressing     Problem: Communication Impairment (Mechanical Ventilation, Invasive)  Goal: Effective Communication  Outcome: Progressing     Problem: Ventilator-Induced Lung Injury (Mechanical Ventilation, Invasive)  Goal: Absence of Ventilator-Induced Lung Injury  Outcome: Progressing

## 2018-06-11 NOTE — Unmapped (Signed)
All treatments given, pt suctions himself, used metaneb for airway clearance. Cameron Baker has no inner cannula, pt does his own trach care. Will cont to monitor.

## 2018-06-12 LAB — BASIC METABOLIC PANEL
ANION GAP: 10 mmol/L (ref 9–15)
BUN / CREAT RATIO: 12
CALCIUM: 9.5 mg/dL (ref 8.5–10.2)
CHLORIDE: 99 mmol/L (ref 98–107)
CO2: 28 mmol/L (ref 22.0–30.0)
CREATININE: 0.6 mg/dL — ABNORMAL LOW (ref 0.70–1.30)
EGFR CKD-EPI AA MALE: >90 mL/min/{1.73_m2} (ref >=60)
GLUCOSE RANDOM: 170 mg/dL (ref 65–179)
POTASSIUM: 4.1 mmol/L (ref 3.5–5.0)
SODIUM: 137 mmol/L (ref 135–145)

## 2018-06-12 LAB — BUN / CREAT RATIO: Urea nitrogen/Creatinine:MRto:Pt:Ser/Plas:Qn:: 12

## 2018-06-12 NOTE — Unmapped (Signed)
VASCULAR INTERVENTIONAL RADIOLOGY (NEW) INPATIENT CONSULTATION     Requesting Attending Physician: Tanja Port, MD  Service Requesting Consult: Pulmonology (MDG)    Date of Service: 06/12/2018  Consulting Interventional Radiologist: Dr. Melynda Ripple     HPI:     Reason for consult: Port placement     History of Present Illness:   Carlson Belland is a 27 y.o. male with VATER syndrome (trach, PEG), hypothyroid, bipolar admitted for tracheobronchitis. He has a history of recurrent infections requiring IV antibiotics. We are consulted for port placement.    Medical History:     Past Medical History:  Past Medical History:   Diagnosis Date   ??? ADHD    ??? Asthma    ??? Bipolar 1 disorder (CMS-HCC)    ??? Chronic tracheobronchitis (CMS-HCC)    ??? Constipation    ??? De Quervain's tenosynovitis    ??? Dysphagia    ??? Esophageal dysmotility    ??? Esophageal stricture    ??? GERD (gastroesophageal reflux disease)    ??? hypothyroid    ??? Seizures (CMS-HCC)    ??? Tracheobronchomalacia    ??? VATER syndrome    ??? Ventilator dependence (CMS-HCC)        Surgical History:  Past Surgical History:   Procedure Laterality Date   ??? ANAL DILATION  01/21/2006   ??? ESOPHAGOGASTRODUODENOSCOPY      multiple   ??? HIP SURGERY Right 01/19/2017    shaved   ??? LATERAL RECTUS RECESSION  11/15/2003   ??? NISSEN FUNDOPLICATION  2001    x 2 redos   ??? PORTACATH PLACEMENT  01/21/2006    taken out same year   ??? PR ESOPHAGEAL MOTILITY STUDY, MANOMETRY N/A 04/23/2018    Procedure: ESOPHAGEAL MOTILITY STUDY W/INT & REP;  Surgeon: Nurse-Based Giproc;  [facility];  Service: Gastroenterology   ??? PR UP GI ENDOSCOPY,BALL DIL,30MM N/A 02/22/2018    Procedure: UGI ENDO; W/BALLOON DILAT ESOPHAGUS (<30MM DIAM);  Surgeon: Zetta Bills, MD;  Location: [facility];  Service: Gastroenterology   ??? PR UP GI ENDOSCOPY,BALL DIL,30MM N/A 05/20/2018    Procedure: UGI ENDO; W/BALLOON DILAT ESOPHAGUS (<30MM DIAM);  Surgeon: Rona Ravens, MD; Location: [facility];  Service: Gastroenterology   ??? REMOVAL VENOUS ACCESS PORT [facility] HISTORICAL RESULT)  11/03/2006   ??? STRABISMUS SURGERY Right    ??? TRACHEOSTOMY  1994   ??? WISDOM TOOTH EXTRACTION  04/2012   ??? WRIST SURGERY      3 times.        Family History:  The patient's family history is not on file. He was adopted..    Medications:   Current Facility-Administered Medications   Medication Dose Route Frequency Provider Last Rate Last Dose   ??? acetaminophen (TYLENOL) solution 650 mg  650 mg G-tube Q4H PRN De-Vaughn Sima Matas, MD   650 mg at 06/08/18 0934   ??? albuterol 2.5 mg /3 mL (0.083 %) nebulizer solution 2.5 mg  2.5 mg Nebulization Q6H (RT) De-Vaughn Sima Matas, MD   2.5 mg at 06/12/18 0931    And   ??? ipratropium (ATROVENT) 0.02 % nebulizer solution 500 mcg  500 mcg Nebulization Q6H (RT) De-Vaughn Sima Matas, MD   500 mcg at 06/12/18 0931   ??? ampicillin-sulbactam (UNASYN) 3 g in sodium chloride 0.9 % (NS) 100 mL IVPB-MBP  3 g Intravenous Q6H SCH Maxwell J Diddams, MD 200 mL/hr at 06/12/18 1248 3 g at 06/12/18 1248   ???  azithromycin (ZITHROMAX) tablet 500 mg  500 mg G-tube Once per day on Mon Wed Fri De-Vaughn Sima Matas, MD   500 mg at 06/11/18 4782   ??? budesonide (PULMICORT) nebulizer solution 1 mg  1 mg Nebulization BID De-Vaughn Sima Matas, MD   1 mg at 06/12/18 0932   ??? cetirizine (ZyrTEC) tablet 10 mg  10 mg G-tube QPM De-Vaughn Sima Matas, MD   10 mg at 06/11/18 1848   ??? cholecalciferol (vitamin D3) tablet 6,000 Units  6,000 Units G-tube Daily De-Vaughn Sima Matas, MD   6,000 Units at 06/12/18 0858   ??? citalopram (CeleXA) tablet 40 mg  40 mg G-tube Nightly De-Vaughn Sima Matas, MD   40 mg at 06/11/18 2137   ??? clindamycin (CLEOCIN T) 1 % external solution   Topical BID De-Vaughn Sima Matas, MD       ??? fluticasone propionate (FLONASE) 50 mcg/actuation nasal spray 2 spray  2 spray Each Nare Daily De-Vaughn Sima Matas, MD   2 spray at 06/12/18 0900   ??? gabapentin (NEURONTIN) oral solution  300 mg G-tube TID De-Vaughn Sima Matas, MD   300 mg at 06/12/18 1304   ??? levothyroxine (SYNTHROID, LEVOTHROID) tablet 75 mcg  75 mcg G-tube Daily De-Vaughn Sima Matas, MD   75 mcg at 06/12/18 0858   ??? lidocaine (LIDODERM) 5 % patch 1 patch  1 patch Transdermal Daily Ivery Quale, MD   1 patch at 06/12/18 0859   ??? LORazepam (ATIVAN) tablet 0.5 mg  0.5 mg G-tube BID De-Vaughn Sima Matas, MD   0.5 mg at 06/12/18 0858   ??? melatonin tablet 3 mg  3 mg Oral QPM [name], MD       ??? minocycline (MINOCIN,DYNACIN) capsule 100 mg  100 mg G-tube Nightly De-Vaughn Sima Matas, MD   100 mg at 06/11/18 2138   ??? mirtazapine (REMERON) tablet 15 mg  15 mg G-tube Nightly De-Vaughn Sima Matas, MD   15 mg at 06/11/18 2139   ??? montelukast (SINGULAIR) tablet 10 mg  10 mg Oral Nightly De-Vaughn Sima Matas, MD   10 mg at 06/11/18 2137   ??? ondansetron (ZOFRAN-ODT) disintegrating tablet 4 mg  4 mg Oral Q8H PRN De-Vaughn Sima Matas, MD       ??? oxyCODONE (ROXICODONE) immediate release tablet 5 mg  5 mg G-tube Q4H PRN Ivery Quale, MD   5 mg at 06/11/18 2139   ??? pantoprazole (PROTONIX) oral suspension  20 mg G-tube BID De-Vaughn Sima Matas, MD   20 mg at 06/12/18 0858   ??? promethazine (PHENERGAN) injection 12.5 mg  12.5 mg Intravenous Q6H PRN [name], MD   12.5 mg at 06/12/18 1248   ??? sodium chloride 3 % nebulizer solution 4 mL  4 mL Nebulization Q6H (RT) Jamesetta Geralds, MD   4 mL at 06/12/18 0932   ??? tobramycin (PF) (TOBI) 300 mg/5 mL nebulizer solution 300 mg  300 mg Nebulization Q12H [facility] De-Vaughn Sima Matas, MD   300 mg at 06/12/18 0932   ??? triamcinolone (KENALOG) 0.1 % ointment 1 application  1 application Topical BID De-Vaughn Sima Matas, MD   1 application at 06/12/18 0900   ??? valproate (DEPAKENE) oral syrup  250 mg G-tube Q24H [name], MD   250 mg at 06/12/18 0859   ??? valproate (DEPAKENE) oral syrup  750 mg G-tube Q24H [name], MD   750 mg at 06/11/18 2140      Medications  Prior to Admission   Medication Sig Dispense Refill Last Dose   ??? acetaminophen (TYLENOL) 160 mg/5 mL (5 mL) suspension 15.6 mL (500 mg total) by G-tube route every four (4) hours as needed for pain. 1 Bottle 0 Past Week at Unknown time   ??? albuterol HFA 90 mcg/actuation inhaler Inhale 2 puffs every six (6) hours as needed for wheezing. 1 Inhaler 11 Past Month at Unknown time   ??? arformoterol (BROVANA) Inhale 2 mL (15 mcg total) by nebulization Two (2) times a day. 120 mL 11 06/07/2018 at Unknown time   ??? azithromycin (ZITHROMAX) 500 MG tablet 1 tablet (500 mg total) by G-tube route 3 (three) times a week. 12 tablet 11 06/07/2018 at Unknown time   ??? budesonide (PULMICORT) 0.5 mg/2 mL nebulizer solution Inhale 4 mL (1 mg total) by nebulization Two (2) times a day. 240 mL 11 06/07/2018 at Unknown time   ??? budesonide-formoterol (SYMBICORT) 160-4.5 mcg/actuation inhaler Inhale 2 puffs Two (2) times a day. 1 Inhaler 11 Past Week at Unknown time   ??? cetirizine (ZYRTEC) 10 MG tablet 1 tablet (10 mg total) by G-tube route every evening.  0 06/07/2018 at Unknown time   ??? cholecalciferol, vitamin D3, (VITAMIN D3) 2,000 unit cap 3 capsules (6,000 Units total) by G-tube route daily.  0 06/07/2018 at Unknown time   ??? citalopram (CELEXA) 40 MG tablet 1 tablet (40 mg total) by G-tube route nightly. 30 tablet 2 06/07/2018 at Unknown time   ??? clindamycin (CLINDAGEL) 1 % gel Apply topically Two (2) times a day. 60 g 6 06/07/2018 at Unknown time   ??? diclofenac sodium (VOLTAREN) 1 % gel Apply 2 g topically Four (4) times a day. (Patient taking differently: Apply 2 g topically 4 (four) times a day as needed. ) 100 g 6 06/07/2018 at Unknown time   ??? dicyclomine (BENTYL) 10 mg capsule 10 mg by G-tube route 4 (four) times a day as needed.    Past Month at Unknown time   ??? fluticasone (FLONASE) 50 mcg/actuation nasal spray 2 sprays by Each Nare route daily.    06/07/2018 at Unknown time   ??? gabapentin (NEURONTIN) 300 MG capsule 300 mg by G-tube route Three (3) times a day.   06/07/2018 at Unknown time   ??? ibuprofen (ADVIL,MOTRIN) 600 MG tablet 1 tablet (600 mg total) by G-tube route every six (6) hours as needed for pain. 30 tablet 1 Past Week at Unknown time   ??? ipratropium-albuterol (DUO-NEB) 0.5-2.5 mg/3 mL nebulizer Inhale 3 mL 4 (four) times a day.    06/07/2018 at Unknown time   ??? levothyroxine (SYNTHROID, LEVOTHROID) 75 MCG tablet 1 tablet (75 mcg total) by G-tube route daily. 90 tablet 3 06/07/2018 at Unknown time   ??? linaclotide (LINZESS) 290 mcg capsule 1 capsule (290 mcg total) by G-tube route daily. 30 capsule 0 06/07/2018 at Unknown time   ??? LORazepam (ATIVAN) 0.5 MG tablet Take 1 tablet (0.5mg ) twice daily and 1 tablet (0.5mg ) daily as needed for anxiety via G-tube 70 tablet 2 06/07/2018 at Unknown time   ??? minocycline (MINOCIN,DYNACIN) 100 MG capsule 1 capsule (100 mg total) by G-tube route nightly. 90 capsule 3 06/07/2018 at Unknown time   ??? mirtazapine (REMERON) 15 MG tablet 1 tablet (15 mg total) by G-tube route nightly. 30 tablet 2 06/07/2018 at Unknown time   ??? montelukast (SINGULAIR) 10 mg tablet Take 1 tablet (10 mg total) by mouth nightly. 30 tablet 11 06/07/2018 at Unknown time   ???  omeprazole (PRILOSEC) 20 MG capsule 20 mg Two (2) times a day (30 minutes before a meal). Via G-tube   06/07/2018 at Unknown time   ??? oxyCODONE (ROXICODONE) 5 MG immediate release tablet 5 mg by G-tube route every four (4) hours as needed for pain.    Past Month at Unknown time   ??? PARI LC D NEBULIZER Misc Provide 1 device  for use with inhaled medication. 2 each 11 06/07/2018 at Unknown time   ??? [EXPIRED] polyethylene glycol (MIRALAX) 17 gram packet 17 g by G-tube route daily as needed. 20 packet 0 Past Month at Unknown time   ??? [EXPIRED] promethazine (PHENERGAN) 6.25 mg/5 mL syrup Take 10 mL (12.5 mg total) by mouth 4 (four) times a day as needed for nausea. for up to 7 days 120 mL 0 Past Week at Unknown time   ??? sodium chloride 3 % nebulizer solution Inhale 4 mL by nebulization 4 (four) times a day. 480 mL 1 06/07/2018 at Unknown time   ??? tiZANidine (ZANAFLEX) 4 MG tablet Take 4 mg by mouth every eight (8) hours as needed.    06/07/2018 at Unknown time   ??? tobramycin, PF, (TOBI) 300 mg/5 mL nebulizer solution Inhale 5 mL (300 mg total) by nebulization every twelve (12) hours. Every other month 280 mL 5 06/07/2018 at Unknown time   ??? triamcinolone (KENALOG) 0.1 % cream Apply topically Two (2) times a day. 15 g 11 06/07/2018 at Unknown time   ??? [EXPIRED] codeine-guaifenesin (GUAIFENESIN AC) 10-100 mg/5 mL liquid Take 5 mL by mouth Three (3) times a day as needed for cough. for up to 5 days 118 mL 0    ??? valproate (DEPAKENE) 250 mg/5 mL syrup TAKE BY G-TUBE EVERY MORNING AND EVERY EVENING 1850 mL 0 05/20/2018 at Unknown time       Allergies:  Xopenex [levalbuterol hcl]; Allopurinol analogues; and Versed [midazolam]    Social History:  Social History     Tobacco Use   ??? Smoking status: Never Smoker   ??? Smokeless tobacco: Never Used   Substance Use Topics   ??? Alcohol use: Yes     Alcohol/week: 1.2 oz     Types: 2 Cans of beer per week     Comment: on weekends sometimes   ??? Drug use: No       Objective:      Vital Signs:  Temp:  [36.5 ??C (97.7 ??F)-37.2 ??C (98.9 ??F)] 36.5 ??C (97.7 ??F)  Heart Rate:  [68-101] 73  SpO2 Pulse:  [98] 98  Resp:  [11-21] 11  BP: (162)/(90) 162/90  MAP (mmHg):  [116] 116  FiO2 (%):  [21 %-28 %] 28 %  SpO2:  [97 %-98 %] 97 %    Diagnostic Studies:  I reviewed all pertinent diagnostic studies, including:      Labs:    Recent Labs     06/11/18  0601   WBC 6.8   HGB 11.6*   HCT 35.9*   PLT 355     Recent Labs     06/10/18  0700 06/11/18  0601 06/12/18  0743   NA 138 139 137   K 4.1 4.4 4.1   CL 102 104 99   BUN 7 7 7    CREATININE 0.63* 0.55* 0.60*   GLU 120 119 170     No results for input(s): PROT, ALBUMIN, AST, ALT, ALKPHOS, BILITOT in the last 72 hours.    Invalid input(s):  BILIDIR  No results for input(s): INR, APTT, FIBRINOGEN in the last 72 hours.      Assessment and Recommendations:     Mr. Wisler is a 27 y.o. male with  VATER syndrome (trach, PEG), hypothyroid, bipolar admitted for tracheobronchitis.     Given that Mr. Collard is actively being treated for infection with a planned 14 day inpatient course of antibiotics, we do not recommend placing a port at this time. However, since he will be an inpatient throughout this treatment course we can re-consult on him as he is nearing completion and place the port prior to his discharge. Alternatively, we can place the port as an outpatient after this admission. Mr. Bucklin will be transferring to [facility] for the remainder of his treatment. Please contact VIR at [facility] should our services be needed.    Discussed with Dr. Jake Seats Diddams this afternoon.    Thank you for involving [place] in the care of this patient. Please page the VIR consult pager [phone]) with further questions, concerns, or if new issues arise.    Georgian Co, MD, PhD  PGY-6, [facility]IR

## 2018-06-12 NOTE — Unmapped (Addendum)
Patient A&O x4, SR on the monitor, on RA during the day and on the Vent at night, other VSS. Pt ambulatory, walks in the room and around the unit without any difficulties. IV Abx given per schedule. Prn Phenergan given x1 for nausea. Fall and skin precautions in place. No falls or injuries during the night. Will continue to monitor.    Problem: Adult Inpatient Plan of Care  Goal: Plan of Care Review  Outcome: Progressing  Goal: Patient-Specific Goal (Individualization)  Outcome: Progressing  Goal: Absence of Hospital-Acquired Illness or Injury  Outcome: Progressing  Goal: Optimal Comfort and Wellbeing  Outcome: Progressing  Goal: Readiness for Transition of Care  Outcome: Progressing  Goal: Rounds/Family Conference  Outcome: Progressing     Problem: Venous Thromboembolism  Goal: VTE (Venous Thromboembolism) Symptom Resolution  Outcome: Progressing     Problem: Fall Injury Risk  Goal: Absence of Fall and Fall-Related Injury  Outcome: Progressing     Problem: Breathing Pattern Ineffective  Goal: Effective Breathing Pattern  Outcome: Progressing     Problem: Asthma Comorbidity  Goal: Maintenance of Asthma Control  Outcome: Progressing     Problem: Communication Impairment (Mechanical Ventilation, Invasive)  Goal: Effective Communication  Outcome: Progressing     Problem: Ventilator-Induced Lung Injury (Mechanical Ventilation, Invasive)  Goal: Absence of Ventilator-Induced Lung Injury  Outcome: Progressing

## 2018-06-12 NOTE — Unmapped (Signed)
Brief [facility] hospitalist (MDX) accept note    Have accepted for transfer to [facility] stepdown Cameron Baker, 27 yo M w VATER syndrome (trach, PEG), hypothyroid, bipolar admitted for tracheobronchitis.  Due to need for continued IV abx and concerns by primary Pulm team that [name] may not be a good home infusion candidate (lives alone), transfer here requested.      Plan:    *Acute tracheobronchitis.    -Continue amp-sulbactam (started 7/4) through 7/17 to complete 14d course.  -Continue airway clearance w Metaneb, 3% saline nebs.  -Continue home albuterol/ipratropium, budesonide, tobramycin nebs, azithro MWF, cetirizine, montelukast.  -Continue Trilogy home vent qhs.  -Due to repeated episodes of tracheobronchitis, will need Port-a-cath placed; however, VIR wishes that this be done on last day of abx (7/17) and to place PICC in meantime.    *VATER syndrome.  Achalasia, esophageal stenosis.  -Continue PPI.    *Hypothyroid.  -Continue levothyroxine.    *Bipolar.  -Continue citalopram, VPA, mirtazapine, lorazepam.    *Acne.  -Continue home minocycline.    *DVT prophylaxis.  -Enoxaparin.    *Dispo.  To remain in-house for duration of IV abx and then to D/C home after that.

## 2018-06-12 NOTE — Unmapped (Signed)
Med G Daily Progress Note    Subjective/Interval History:  - No acute events overnight  - Will continue Unasyn for 14 day course- port placement via VIR Monday or Tuesday. Will remain inpatient for entire 14 day course, but may consider transfer to [facility] after port placement to complete abx there.      Assessment/Plan:    Principal Problem:    Chronic tracheobronchitis (CMS-HCC)  Active Problems:    Hypothyroidism    Mood disorder (CMS-HCC)    VATER syndrome  Resolved Problems:    * No resolved hospital problems. *        Pressure Ulcer(s)    Active Pressure Ulcer     None                 Hospital Day 4    Cameron Baker??is a 27 y.o.??male??with history of VATER syndrome??(s/p trach/peg)??w/ frequent admissions for??chronic??tracheobronchitis, hypothyroidism, asthma,??bipolar 1 disorder, acne on minocycline, recent hospitalization 6/15-6/17 for tracheobronchitis presenting with repeat tracheobronchitis.   ??  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 Multiple hospitalizations for chronic tracheobronchitis, most recently from 6/15-6/17. LR culture 6/17 grew 2+ gram positive bacilli/1+ gram positive cocci, and he was treated with IV cefepime/vancomycin then was transitioned to oral levofloxacin which was completed 6/24. Developed increasing respiratory symptoms of cough, congestion, increased secretions.??Flu/RPP negative, blood cultures negative at 24 hours, CXR unremarkable. Symptoms are consistent with his known tracheobronchitis, possibly a recurrence due to insufficient abx duration or coverage. Admission LR Cx 7/2 growing only oropharyngeal flora.  - Continue Unasyn 3g q6h (7/4-)  - Airway clearance??per RT (metaneb, 3% saline nebs)   - Continue home meds of albuterol/Atrovent, azithromycin??500 mg MWF, budesonide??nebs, Zyrtec??10 mg, Singulair, TOBI??  - Continue IV phenergan 12.5 mg q6h PRN, Zofran 4mg  q8h PRN for nausea wih med administration  ??  Type II Achlasia 2/2 VATER syndrome s/p recent EGD dilation:   - Continue??protnix 20 mg daily, Gabapentin 300 mg TID  ??  Bipolar 1 Disorder/PTSD:   - Continue home Celexa 40mg , Valproate 250 mg AM/750 mg PM, Remeron 15 mg qhs, Ativan 0.5 mg BID,   ??  Hypothyroidism:   - Continue home synthroid 75 mcg daily  ??  Acne:   - Continue home minocycline 100 mg nightly  ??  FEN/GI:   - Regular diet   ??  Ppx:   - Lovenox 40mg  SQ daily   ??  Access: PIV   ??  Code status: Full??      ___________________________________________________________________    Labs/Studies:  Labs and Studies from the last 24hrs per EMR and Reviewed    Objective:  Temp:  [36.4 ??C-37.2 ??C] 37.2 ??C  Heart Rate:  [68-101] 73  SpO2 Pulse:  [98] 98  Resp:  [11-21] 11  BP: (160-185)/(70-97) 162/90  FiO2 (%):  [21 %-28 %] 28 %  SpO2:  [97 %-99 %] 97 %    Physical Exam:  General: Initially sleeping, arouses without difficulty. Alert, well appearing without any signs of distress. Trach in place.  CV: Regular rate/rhythm, no murmurs/gallops/rubs appreciated.   Lungs: Diffuse inspiratory rhonchi unchanged from prior exams. No crackles appreciated. No increased work of breathing.  Extremities: No LE edema.     [name], MD  Internal Medicine PGY-1  06/12/18 11:27 AM

## 2018-06-13 NOTE — Unmapped (Signed)
VSS pt independent with care, remains on IV antibiotics and waiting for PICC to be placed.  Pt SD status, on trach collar during and trilogy vent at night.  Denies pain, family visited today.  Problem: Adult Inpatient Plan of Care  Goal: Plan of Care Review  Outcome: Progressing  Goal: Patient-Specific Goal (Individualization)  Outcome: Progressing  Goal: Absence of Hospital-Acquired Illness or Injury  Outcome: Progressing  Goal: Optimal Comfort and Wellbeing  Outcome: Progressing  Goal: Readiness for Transition of Care  Outcome: Progressing  Goal: Rounds/Family Conference  Outcome: Progressing     Problem: Venous Thromboembolism  Goal: VTE (Venous Thromboembolism) Symptom Resolution  Outcome: Progressing     Problem: Fall Injury Risk  Goal: Absence of Fall and Fall-Related Injury  Outcome: Progressing     Problem: Breathing Pattern Ineffective  Goal: Effective Breathing Pattern  Outcome: Progressing     Problem: Asthma Comorbidity  Goal: Maintenance of Asthma Control  Outcome: Progressing     Problem: Communication Impairment (Mechanical Ventilation, Invasive)  Goal: Effective Communication  Outcome: Progressing     Problem: Ventilator-Induced Lung Injury (Mechanical Ventilation, Invasive)  Goal: Absence of Ventilator-Induced Lung Injury  Outcome: Progressing

## 2018-06-13 NOTE — Unmapped (Addendum)
Pt arrived on RA via transport, pt states he self suctions, clean and changes trach, pt states trach last changed on 06/08/18 with personal trach brought from home. Request sent to [facility] RT department to request pt receives correct replacement trach.  02 sat's 98% at this time pt also ordered vent at [facility] Trilogy at bedside. Pt appears to be settling in without any signs of distress or discomfort. Ambu bag at [facility] temporary. Pt uses metaneb with tx's lungs diminished upon arrival.

## 2018-06-13 NOTE — Unmapped (Signed)
Pt stable, no acute events overnight.   Neuro: Drowsy, but AOx4. CAM-ICU -. PERRL. X1 prn of PO Flexeril and x1 prn of IV promethazine.   CV: SD status. HR 70-100's. BP 110-150's. Afebrile. Pulses palpable.   Resp: #6 Bivona. Trilogy vent overnight. Vent settings TV 525, Peep 5. No desaturations noted.   GI/GU: Voids in urinal. UOP adequate. PRN Enema given, x1 BM overnight. Medications crushed and given through PEG tube. Otherwise, pt eats/drinks by mouth.   Lines: 20g LFA, x1 NSC for antibiotics.   Wounds: Skin intact.  Pt safe/free from falls.       Problem: Adult Inpatient Plan of Care  Goal: Plan of Care Review  Outcome: Progressing  Goal: Patient-Specific Goal (Individualization)  Outcome: Progressing  Goal: Absence of Hospital-Acquired Illness or Injury  Outcome: Progressing  Goal: Optimal Comfort and Wellbeing  Outcome: Progressing  Goal: Readiness for Transition of Care  Outcome: Progressing  Goal: Rounds/Family Conference  Outcome: Progressing     Problem: Venous Thromboembolism  Goal: VTE (Venous Thromboembolism) Symptom Resolution  Outcome: Progressing     Problem: Fall Injury Risk  Goal: Absence of Fall and Fall-Related Injury  Outcome: Progressing     Problem: Breathing Pattern Ineffective  Goal: Effective Breathing Pattern  Outcome: Progressing     Problem: Asthma Comorbidity  Goal: Maintenance of Asthma Control  Outcome: Progressing     Problem: Communication Impairment (Mechanical Ventilation, Invasive)  Goal: Effective Communication  Outcome: Progressing     Problem: Ventilator-Induced Lung Injury (Mechanical Ventilation, Invasive)  Goal: Absence of Ventilator-Induced Lung Injury  Outcome: Progressing

## 2018-06-13 NOTE — Unmapped (Signed)
Hospitalist Daily Progress Note     LOS: 5 days under inpatient status    Assessment/Plan:  Principal Problem:    Chronic tracheobronchitis (CMS-HCC)  Active Problems:    Hypothyroidism    Mood disorder (CMS-HCC)    VATER syndrome  Resolved Problems:    * No resolved hospital problems. *         *Acute tracheobronchitis, recurrent.  Resolving.   -Continue IV amp-sulbactam (started 7/4) through 7/17 to complete 14d course.  -Continue airway clearance w Metaneb, 3% saline nebs.  -Continue home albuterol/ipratropium, budesonide, tobramycin nebs, azithro MWF, cetirizine, montelukast.  -Continue Trilogy home vent qhs.  -Due to repeated episodes of tracheobronchitis, will need Port-a-cath placed; however, [facility] wishes that this be done just prior to D/C (7/17) and that PICC be placed in meantime.  ??  *VATER syndrome.  Achalasia, esophageal stenosis.  -Continue PPI.  ??  *Hypothyroid.  -Continue levothyroxine.  ??  *Bipolar.  Stable.  -Continue citalopram, VPA, mirtazapine, lorazepam.  ??  *Acne.  -Continue home minocycline.  ??  *DVT prophylaxis.  -Enoxaparin.  ??  *Dispo.  To remain in-house for duration of IV abx per primary Pulm team and then to D/C home after that.      Please page the [facility]) pager at [phone] with questions.      Consultants: None    Pending labs:    Order Current Status    Lower Respiratory Culture Preliminary result            Subjective:   Promethazine x 3, oxycodone x 1, cyclobenzaprine x 1 in last 24 hrs.  Trach secretions w less color.  No SOB, no chest pain.      Objective:   Physical Exam:  General: Sitting comfortably in bed, no acute distress.   Eyes: Anicteric sclerae, no conjunctival injection.     ENT: Mucous membranes moist.     Respiratory: Normal respiratory effort.  Clear to auscultation bilaterally without wheezes or crackles.    Cardiovascular: Regular rhythm w/o murmur.     Gastrointestinal: Nondistended.    Skin: No obvious rashes or breakdown.  Warm, dry. Musculoskeletal: Motor strength in all extremities grossly intact.   Psychiatric: Alert.  Answers questions appropriately.  Judgment and insight intact.   Neurologic: Extraocular movements intact, no facial asymmetry.  No gross sensory deficits.         Vital signs in last 24 hours:  Temp:  [36.4 ??C (97.5 ??F)-36.8 ??C (98.2 ??F)] 36.4 ??C (97.5 ??F)  Heart Rate:  [67-108] 82  SpO2 Pulse:  [47-108] 81  Resp:  [11-23] 23  BP: (119-159)/(55-92) 124/55  MAP (mmHg):  [74-117] 80  FiO2 (%):  [28 %] 28 %  SpO2:  [96 %-100 %] 100 %    Intake/Output last 24 hours:    Intake/Output Summary (Last 24 hours) at 06/13/2018 0554  Last data filed at 06/13/2018 0400  Gross per 24 hour   Intake 1600 ml   Output 2000 ml   Net -400 ml       Most recent recorded weights:  Last 5 Recorded Weights    06/08/18 1733 06/09/18 1932 06/11/18 0000   Weight: 94.1 kg (207 lb 7.3 oz) 96.6 kg (212 lb 15.4 oz) 98.8 kg (217 lb 13 oz)       Medications:   Scheduled Meds:  ??? albuterol  2.5 mg Nebulization Q6H (RT)    And   ??? ipratropium  500 mcg Nebulization Q6H (RT)   ???  ampicillin-sulbactam (UNAYSN) IV  3 g Intravenous Q6H [facility]   ??? arformoterol  15 mcg Nebulization BID (RT)   ??? azithromycin  500 mg G-tube Once per day on Mon Wed Fri   ??? budesonide  1 mg Nebulization BID   ??? cetirizine  10 mg G-tube QPM   ??? cholecalciferol (vitamin D3)  6,000 Units G-tube Daily   ??? citalopram  40 mg G-tube Nightly   ??? clindamycin   Topical BID   ??? esomeprazole  40 mg Oral daily   ??? fluticasone propionate  2 spray Each Nare Daily   ??? gabapentin  300 mg G-tube TID   ??? levothyroxine  75 mcg G-tube Daily   ??? lidocaine  1 patch Transdermal Daily   ??? LORazepam  0.5 mg G-tube BID   ??? melatonin  3 mg Oral QPM   ??? minocycline  100 mg G-tube Nightly   ??? mirtazapine  15 mg G-tube Nightly   ??? montelukast  10 mg Oral Nightly   ??? sodium chloride  4 mL Nebulization Q6H (RT)   ??? tobramycin (PF)  300 mg Nebulization Q12H [facility]   ??? triamcinolone  1 application Topical BID   ??? valproate  250 mg G-tube Q24H   ??? valproate  750 mg G-tube Q24H

## 2018-06-14 NOTE — Unmapped (Addendum)
Patient on home vent while sleeping. While awake patient capped and on room air.  # 6 bivona. RT managing patient's breathing treatment. Patient able to eat solid food but medications are being given via mickey button. PIC line successfully placed this am. Patient alert and oriented. VSS. Voiding in urinal. Large area around mickey button red. Cream applied as ordered. Split gauze changed prn to keep skin dry. Phenergan given x1 for c/o nausea with patient verbalizing relief. Patient awake around 1630 up ambulating and eating. Medicated for pain with good relief. Patient requesting something for nausea. Phenergan not do until 1830 patient states he is alright waiting for phenegran.   Problem: Adult Inpatient Plan of Care  Goal: Plan of Care Review  Outcome: Progressing  Flowsheets (Taken 06/14/2018 1443)  Progress: improving  Plan of Care Reviewed With: patient  Goal: Patient-Specific Goal (Individualization)  Outcome: Progressing  Flowsheets (Taken 06/14/2018 1443)  Patient-Specific Goals (Include Timeframe): Patient would like to walk down to the gift shop today.  Individualized Care Needs: Patient likes to sleep late.  Anxieties, Fears or Concerns: Patient denies any concerns or fears  Goal: Absence of Hospital-Acquired Illness or Injury  Outcome: Progressing  Goal: Optimal Comfort and Wellbeing  Outcome: Progressing  Goal: Readiness for Transition of Care  Outcome: Progressing     Problem: Fall Injury Risk  Goal: Absence of Fall and Fall-Related Injury  Outcome: Progressing     Problem: Breathing Pattern Ineffective  Goal: Effective Breathing Pattern  Outcome: Progressing     Problem: Asthma Comorbidity  Goal: Maintenance of Asthma Control  Outcome: Progressing     Problem: Communication Impairment (Mechanical Ventilation, Invasive)  Goal: Effective Communication  Outcome: Progressing     Problem: Infection  Goal: Infection Symptom Resolution  Outcome: Progressing     Problem: Ventilator-Induced Lung Injury (Mechanical Ventilation, Invasive)  Goal: Absence of Ventilator-Induced Lung Injury  Outcome: Not Progressing     Problem: Nausea and Vomiting  Goal: Fluid and Electrolyte Balance  Outcome: Not Progressing     Problem: Adult Inpatient Plan of Care  Goal: Rounds/Family Conference  Flowsheets  Taken 06/10/2018 1750 by Karl Luke, RN  Participants: nursing  Taken 06/14/2018 1443 by Viviann Spare, RN  Clinical EDD (Estimated Discharge Date) : 06/18/18    good relief. No falls, no DVT noted.

## 2018-06-14 NOTE — Unmapped (Signed)
PICC LINE INSERTION PROCEDURE NOTE    Indications:  Antibiotic Therapy    Consent/Time Out:    Risks, benefits and alternatives discussed with patient.  Written consent was obtained prior to the procedure and is detailed in the medical record.  Prior to the start of the procedure, a time out was taken and the identity of the patient was confirmed via name, medical record number and  date of birth.  The availability of the correct equipment was verified.    Procedure Details:  The vein was identified and measured for appropriate catheter length. Maximum sterile techniques was utilized.  Sterile field prepared with necessary supplies and equipment. Insertion site was prepped with chlorhexidine solution and allowed to dry. Lidocaine 3 mL Subcutaneous administered to insertion site.  The catheter was primed with normal saline.  A 4 FR Single lumen PICC inserted to the R Basilic vein with 1 insertion attempt(s).  Catheter aspirated, 10 mL blood return present.  The catheter was then flushed with 10 mL of normal saline.   Insertion site cleansed, and sterile dressing applied per manufacturer guidelines. The Central Line Checklist was referenced.  The catheter was inserted without difficulty by Octavio Graves RN.    Findings:  Manufacturer:  BARD  Lot #:  [id number]  CT Injectable (power):  yes  Total catheter length:  39 cm.    Catheter left at:  2 cm.  Catheter trimmed:  yes  Port reserved:  No  EBL mL: 4    Vein Size:   Right arm basilic vein compressible:  Yes, measurement:  5.4 mm  Upper Arm Circumference (cm): 40 cm       Tip Placement Verification:   [employer], [employer]lock    Workup Time:    60 minutes    Recommendations:  PICC Brochure given to patient with teaching instruction.

## 2018-06-14 NOTE — Unmapped (Signed)
Pt stable, no acute events overnight.   Neuro: AOx4. CAM-ICU -. PERRL. X1 prn of PO Flexeril and x1 prn of IV promethazine.   CV: Floor status. HR 70-80's. BP 120-130's. Afebrile. Pulses palpable.   Resp: #6 Bivona. Trilogy vent overnight. Vent settings TV 525, Peep 5, 28% FiO2. No desaturations noted.   GI/GU: Voids in urinal. UOP adequate. PRN Enema given, x1 BM overnight. Medications crushed and given through PEG tube. Otherwise, pt eats/drinks by mouth.   Lines: 20g LFA, x1 NSC for antibiotics.   Wounds: Skin intact.  Pt safe/free from falls.     Problem: Adult Inpatient Plan of Care  Goal: Plan of Care Review  Outcome: Progressing  Goal: Patient-Specific Goal (Individualization)  Outcome: Progressing  Goal: Absence of Hospital-Acquired Illness or Injury  Outcome: Progressing  Goal: Optimal Comfort and Wellbeing  Outcome: Progressing  Goal: Readiness for Transition of Care  Outcome: Progressing  Goal: Rounds/Family Conference  Outcome: Progressing     Problem: Venous Thromboembolism  Goal: VTE (Venous Thromboembolism) Symptom Resolution  Outcome: Progressing     Problem: Fall Injury Risk  Goal: Absence of Fall and Fall-Related Injury  Outcome: Progressing     Problem: Breathing Pattern Ineffective  Goal: Effective Breathing Pattern  Outcome: Progressing     Problem: Asthma Comorbidity  Goal: Maintenance of Asthma Control  Outcome: Progressing     Problem: Communication Impairment (Mechanical Ventilation, Invasive)  Goal: Effective Communication  Outcome: Progressing     Problem: Ventilator-Induced Lung Injury (Mechanical Ventilation, Invasive)  Goal: Absence of Ventilator-Induced Lung Injury  Outcome: Progressing

## 2018-06-14 NOTE — Unmapped (Signed)
Hospitalist Daily Progress Note     LOS: 6 days under inpatient status    Assessment/Plan:  Principal Problem:    Chronic tracheobronchitis (CMS-HCC)  Active Problems:    Hypothyroidism    Mood disorder (CMS-HCC)    VATER syndrome  Resolved Problems:    * No resolved hospital problems. *         *Acute tracheobronchitis, recurrent.  Resolving.??  -Continue IV amp-sulbactam (started 7/4) through 7/17 to complete 14d course.  -Continue airway clearance w Metaneb, 3% saline nebs.  -Continue home albuterol/ipratropium, budesonide, tobramycin nebs, azithro MWF, cetirizine, montelukast.  -Continue Trilogy home vent qhs.  -Due to repeated episodes of tracheobronchitis, need for IV abx, per primary Pulm team will need Port-a-cath placed; however, [facility] wishes that this be done just prior to D/C (7/17) and that PICC be placed in meantime.  ??  *VATER syndrome. ??Achalasia, esophageal stenosis.  -Continue PPI.  ??  *Hypothyroid.  -Continue levothyroxine.  ??  *Bipolar.  Stable.  -Continue citalopram, VPA, mirtazapine, lorazepam.  ??  *Acne.  -Continue home minocycline.  ??  *DVT prophylaxis.  -Enoxaparin.  ??  *Dispo. ??To remain in-house for duration of IV abx per primary Pulm team and then to D/C home after that.  ??  ??  Please page the [facility]) pager at [phone] with questions.  ??  ??  Consultants: None          Subjective:   Promethazine x 4, oxycodone x 1, cyclobenzaprine x 1 in last 24 hrs.  No chest pain, shortness of breath.  No change in secretions but overall doing well.      Objective:   Physical Exam:  General: Lying comfortably in bed, no acute distress.   Eyes: Anicteric sclerae, no conjunctival injection.    ENT: Mucous membranes moist.     Neck: Tracheostomy   Respiratory: Normal respiratory effort.  Clear to auscultation bilaterally without wheezes or crackles.    Cardiovascular: Regular rhythm without murmur.  No lower extremity edema.   Gastrointestinal: Nondistended.   Skin: No obvious rashes or breakdown.  Warm, dry.   Musculoskeletal: Motor strength in all extremities grossly intact.   Psychiatric: Alert.  Answers questions appropriately.  Judgment and insight intact.   Neurologic: Extraocular movements intact, no facial asymmetry.         Vital signs in last 24 hours:  Temp:  [36.5 ??C (97.7 ??F)-36.8 ??C (98.2 ??F)] 36.5 ??C (97.7 ??F)  Heart Rate:  [75-109] 91  SpO2 Pulse:  [77-89] 84  Resp:  [10-26] 12  BP: (109-139)/(55-91) 109/55  MAP (mmHg):  [70-105] 70  FiO2 (%):  [28 %] 28 %  SpO2:  [95 %-100 %] 100 %    Intake/Output last 24 hours:    Intake/Output Summary (Last 24 hours) at 06/14/2018 0551  Last data filed at 06/14/2018 0400  Gross per 24 hour   Intake 820 ml   Output 3070 ml   Net -2250 ml       Most recent recorded weights:  Last 5 Recorded Weights    06/08/18 1733 06/09/18 1932 06/11/18 0000   Weight: 94.1 kg (207 lb 7.3 oz) 96.6 kg (212 lb 15.4 oz) 98.8 kg (217 lb 13 oz)       Medications:   Scheduled Meds:  ??? albuterol  2.5 mg Nebulization Q6H (RT)    And   ??? ipratropium  500 mcg Nebulization Q6H (RT)   ??? ampicillin-sulbactam (UNAYSN) IV  3 g Intravenous Q6H  [facility]   ??? arformoterol  15 mcg Nebulization BID (RT)   ??? azithromycin  500 mg G-tube Once per day on Mon Wed Fri   ??? budesonide  1 mg Nebulization BID   ??? cetirizine  10 mg G-tube QPM   ??? cholecalciferol (vitamin D3)  6,000 Units G-tube Daily   ??? citalopram  40 mg G-tube Nightly   ??? clindamycin   Topical BID   ??? esomeprazole  40 mg Oral daily   ??? fluticasone propionate  2 spray Each Nare Daily   ??? gabapentin  300 mg G-tube TID   ??? levothyroxine  75 mcg G-tube Daily   ??? lidocaine  1 patch Transdermal Daily   ??? LORazepam  0.5 mg G-tube BID   ??? melatonin  3 mg Oral QPM   ??? minocycline  100 mg G-tube Nightly   ??? mirtazapine  15 mg G-tube Nightly   ??? montelukast  10 mg Oral Nightly   ??? sodium chloride  4 mL Nebulization Q6H (RT)   ??? tobramycin (PF)  300 mg Nebulization Q12H [facility]   ??? triamcinolone  1 application Topical BID   ??? valproate  250 mg G-tube Q24H   ??? valproate  750 mg G-tube Q24H

## 2018-06-15 LAB — HEPATIC FUNCTION PANEL
ALKALINE PHOSPHATASE: 55 U/L (ref 38–126)
BILIRUBIN DIRECT: <0.1 mg/dL (ref 0.00–0.40)
BILIRUBIN TOTAL: 0.4 mg/dL (ref 0.0–1.2)
PROTEIN TOTAL: 7.1 g/dL (ref 6.5–8.3)

## 2018-06-15 LAB — BILIRUBIN DIRECT: Bilirubin.glucuronidated:MCnc:Pt:Ser/Plas:Qn:: <0.1

## 2018-06-15 NOTE — Unmapped (Signed)
Problem: Adult Inpatient Plan of Care  Goal: Plan of Care Review  Outcome: Progressing  Goal: Patient-Specific Goal (Individualization)  Outcome: Progressing  Goal: Absence of Hospital-Acquired Illness or Injury  Outcome: Progressing  Goal: Optimal Comfort and Wellbeing  Outcome: Progressing  Goal: Readiness for Transition of Care  Outcome: Progressing  Goal: Rounds/Family Conference  Outcome: Progressing     Problem: Venous Thromboembolism  Goal: VTE (Venous Thromboembolism) Symptom Resolution  Outcome: Progressing     Problem: Fall Injury Risk  Goal: Absence of Fall and Fall-Related Injury  Outcome: Progressing     Problem: Breathing Pattern Ineffective  Goal: Effective Breathing Pattern  Outcome: Progressing     Problem: Asthma Comorbidity  Goal: Maintenance of Asthma Control  Outcome: Progressing     Problem: Communication Impairment (Mechanical Ventilation, Invasive)  Goal: Effective Communication  Outcome: Progressing     Problem: Ventilator-Induced Lung Injury (Mechanical Ventilation, Invasive)  Goal: Absence of Ventilator-Induced Lung Injury  Outcome: Progressing     Problem: Infection  Goal: Infection Symptom Resolution  Outcome: Progressing     Problem: Nausea and Vomiting  Goal: Fluid and Electrolyte Balance  Outcome: Progressing

## 2018-06-15 NOTE — Unmapped (Signed)
Patient on home vent while sleeping. While awake patient capped and on room air.  # 6 bivona. RT managing patient's breathing treatment. Patient able to eat solid food but medications are being given via mickey button. PIC line patent. Patient alert and oriented. VSS. Voiding in urinal. Large area around mickey button less red then yesterday. Cream applied as ordered. Split gauze changed prn to keep skin dry. Phenergan given x1 for c/o nausea with patient verbalizing relief. Ambulating independently with no issues. PO intake good.   Problem: Adult Inpatient Plan of Care  Goal: Plan of Care Review  Outcome: Progressing  Goal: Patient-Specific Goal (Individualization)  Outcome: Progressing  Goal: Absence of Hospital-Acquired Illness or Injury  Outcome: Progressing  Goal: Optimal Comfort and Wellbeing  Outcome: Progressing  Goal: Readiness for Transition of Care  Outcome: Progressing  Goal: Rounds/Family Conference  Outcome: Progressing     Problem: Venous Thromboembolism  Goal: VTE (Venous Thromboembolism) Symptom Resolution  Outcome: Progressing     Problem: Fall Injury Risk  Goal: Absence of Fall and Fall-Related Injury  Outcome: Progressing     Problem: Breathing Pattern Ineffective  Goal: Effective Breathing Pattern  Outcome: Progressing     Problem: Asthma Comorbidity  Goal: Maintenance of Asthma Control  Outcome: Progressing     Problem: Communication Impairment (Mechanical Ventilation, Invasive)  Goal: Effective Communication  Outcome: Progressing     Problem: Ventilator-Induced Lung Injury (Mechanical Ventilation, Invasive)  Goal: Absence of Ventilator-Induced Lung Injury  Outcome: Progressing     Problem: Infection  Goal: Infection Symptom Resolution  Outcome: Progressing     Problem: Nausea and Vomiting  Goal: Fluid and Electrolyte Balance  Outcome: Progressing

## 2018-06-15 NOTE — Unmapped (Signed)
Pulmonology Daily Progress Note     LOS: 7 days under inpatient status    Assessment/Plan:  Principal Problem:    Chronic tracheobronchitis (CMS-HCC)  Active Problems:    Hypothyroidism    Mood disorder (CMS-HCC)    VATER syndrome  Resolved Problems:    * No resolved hospital problems. *         *Acute tracheobronchitis, recurrent.  Resolving.??  -Continue IV amp-sulbactam (started 7/4) through 7/17 to complete 14d course.  -Continue airway clearance w Metaneb, 3% saline nebs.  -Continue home albuterol/ipratropium, budesonide, tobramycin nebs, azithro MWF, cetirizine, montelukast.  -Continue Trilogy home vent qhs.  -Due to repeated episodes of tracheobronchitis, need for IV abx, per primary Pulm team will need Port-a-cath placed; however, [facility] wishes that this be done just prior to D/C (7/17) and that PICC be placed in meantime.  ??  *VATER syndrome. ??Achalasia, esophageal stenosis.  -Continue PPI.  ??  *Hypothyroid.  -Continue levothyroxine.  ??  *Bipolar.  Stable.  -Continue citalopram, VPA, mirtazapine, lorazepam.  ??  *Acne.  -Continue home minocycline.  ??  *DVT prophylaxis.  -Enoxaparin.  ??  *Dispo. ??To remain in-house for duration of IV abx per primary Pulm team and then to D/C home after that.  ??  ??  Please page [phone] with any questions.    [name], MD  ??    Subjective:   No chest pain, shortness of breath.  No change in secretions but overall doing well.    Objective:   Physical Exam:  General: Lying comfortably in bed, no acute distress.   Eyes: Anicteric sclerae, no conjunctival injection.    ENT: Mucous membranes moist.     Neck: Tracheostomy   Respiratory: Normal respiratory effort.  Clear to auscultation bilaterally without wheezes or crackles.    Cardiovascular: Regular rhythm without murmur.  No lower extremity edema.   Gastrointestinal: Nondistended.   Skin: No obvious rashes or breakdown.  Warm, dry.   Musculoskeletal: Motor strength in all extremities grossly intact.   Psychiatric: Alert.  Answers questions appropriately.  Judgment and insight intact.   Neurologic: Extraocular movements intact, no facial asymmetry.         Vital signs in last 24 hours:  Temp:  [36.4 ??C (97.6 ??F)-37.1 ??C (98.8 ??F)] 36.4 ??C (97.6 ??F)  Heart Rate:  [75-103] 80  SpO2 Pulse:  [75-96] 80  Resp:  [14-22] 22  BP: (122-166)/(64-85) 122/64  MAP (mmHg):  [81-102] 81  FiO2 (%):  [28 %] 28 %  SpO2:  [94 %-100 %] 100 %    Intake/Output last 24 hours:    Intake/Output Summary (Last 24 hours) at 06/15/2018 1036  Last data filed at 06/15/2018 0600  Gross per 24 hour   Intake 490 ml   Output 1700 ml   Net -1210 ml       Most recent recorded weights:  Last 5 Recorded Weights    06/08/18 1733 06/09/18 1932 06/11/18 0000   Weight: 94.1 kg (207 lb 7.3 oz) 96.6 kg (212 lb 15.4 oz) 98.8 kg (217 lb 13 oz)       Medications:   Scheduled Meds:  ??? albuterol  2.5 mg Nebulization Q6H (RT)    And   ??? ipratropium  500 mcg Nebulization Q6H (RT)   ??? ampicillin-sulbactam (UNAYSN) IV  3 g Intravenous Q6H [facility]   ??? arformoterol  15 mcg Nebulization BID (RT)   ??? azithromycin  500 mg G-tube Once per day on Mon Wed Fri   ???  budesonide  1 mg Nebulization BID   ??? cetirizine  10 mg G-tube QPM   ??? cholecalciferol (vitamin D3)  6,000 Units G-tube Daily   ??? citalopram  40 mg G-tube Nightly   ??? clindamycin   Topical BID   ??? esomeprazole  40 mg Oral daily   ??? fluticasone propionate  2 spray Each Nare Daily   ??? gabapentin  300 mg G-tube TID   ??? levothyroxine  75 mcg G-tube Daily   ??? lidocaine  1 patch Transdermal Daily   ??? LORazepam  0.5 mg G-tube BID   ??? melatonin  3 mg Oral QPM   ??? minocycline  100 mg G-tube Nightly   ??? mirtazapine  15 mg G-tube Nightly   ??? montelukast  10 mg Oral Nightly   ??? sodium chloride  4 mL Nebulization Q6H (RT)   ??? tobramycin (PF)  300 mg Nebulization Q12H [facility]   ??? triamcinolone  1 application Topical BID   ??? valproate  250 mg G-tube Q24H   ??? valproate  750 mg G-tube Q24H

## 2018-06-15 NOTE — Unmapped (Signed)
Problem: Adult Inpatient Plan of Care  Goal: Plan of Care Review  Outcome: Progressing  Note:   Pt remains on RA during day and vent qhs, BBS rhonchi, pt refused suction this shift, administered inhaled medications without adverse reactions, metaneb performed for mucus clearance, no complications or distress noted this shift.

## 2018-06-15 NOTE — Unmapped (Signed)
Pt refused to be suctioned.

## 2018-06-15 NOTE — Unmapped (Signed)
Speech Language Trach Speaking Valve Assessment  06/15/2018    Patient Name:  Cameron Baker       Medical Record Number: [id number]   Date of Birth: 1991/10/19  Sex: Male            SLP Treatment Diagnosis: communication impairment s/p trach  Session Duration : 16  Activity Tolerance: Patient tolerated treatment well    Assessment:   Pt seen for passy muir speaking valve (PMSV) assessment. Pt with Bivona #6 cuffless trach on room air with nocturnal ventilation. Pt known to tolerate PMSV at home, per report, PMSV lost overnight. Pt voicing independently without digital occlusion, thus speaking valve placed. Pt tolerated PMSV throughout session, vitals remained stable throughout. SLP attached valve to trach with strap. Reviewed use including remove for sleeping, clean with mild soap and water, remove with discomfort. No further acute ST warranted at this time, please reconsult should further needs arise.    Prognosis: Excellent  Barriers to Discharge: None     Plan of Care:  SLP Follow-up / Frequency: D/C Services, D/C Services      Treatment Goals:  Patient and Family Goals: To get out and to get a port in    Discharge Recommendations:  SLP services not indicated    Subjective:  Current Functional Status: is a 27 y.o. male with PMHx of  VATER syndrome s/p trach (since birth)/peg w/ frequent admissions for chronic tracheobronchitis, hypothyroidism, asthma, bipolar 1 disorder, acne, type II achalasia and esophageal stenosis s/p esophageal dilation who presents with increased cough/congestion.  Prior Function: Required physical assistance from others;Did not drive prior to admission;On disability  Lives With: Alone  Educational Status: GED  Leisure/Hobbies: this and that     Communication Preference: Verbal  Patient/Caregiver Reports: Pt reports his trach was placed at birth and he is familiar with passy muir speaking valve use     Current Facility-Administered Medications   Medication Dose Route Frequency Provider Last Rate Last Dose   ??? acetaminophen (TYLENOL) solution 650 mg  650 mg G-tube Q4H PRN Anell Barr, MD   650 mg at 06/14/18 1623   ??? albuterol 2.5 mg /3 mL (0.083 %) nebulizer solution 2.5 mg  2.5 mg Nebulization Q6H (RT) Anell Barr, MD   2.5 mg at 06/15/18 0945    And   ??? ipratropium (ATROVENT) 0.02 % nebulizer solution 500 mcg  500 mcg Nebulization Q6H (RT) Anell Barr, MD   500 mcg at 06/15/18 0945   ??? ampicillin-sulbactam (UNASYN) 3 g in sodium chloride 0.9 % (NS) 100 mL IVPB-MBP  3 g Intravenous Q6H [facility] Anell Barr, MD 200 mL/hr at 06/15/18 0550 3 g at 06/15/18 0550   ??? arformoterol (BROVANA) nebulizer solution 15 mcg/2 mL  15 mcg Nebulization BID (RT) Peter Garter, MD   15 mcg at 06/15/18 1021   ??? azithromycin [facility]) tablet 500 mg  500 mg G-tube Once per day on Mon Wed Fri Anell Barr, MD   500 mg at 06/14/18 0901   ??? budesonide (PULMICORT) nebulizer solution 1 mg  1 mg Nebulization BID Anell Barr, MD   1 mg at 06/15/18 1011   ??? cetirizine (ZyrTEC) tablet 10 mg  10 mg G-tube QPM Anell Barr, MD   10 mg at 06/14/18 1802   ??? cholecalciferol (vitamin D3) tablet 6,000 Units  6,000 Units G-tube Daily Anell Barr, MD   6,000 Units at 06/15/18 0840   ??? citalopram (CeleXA) tablet  40 mg  40 mg G-tube Nightly Anell Barr, MD   40 mg at 06/14/18 2120   ??? clindamycin (CLEOCIN T) 1 % external solution   Topical BID Anell Barr, MD       ??? cyclobenzaprine (FLEXERIL) tablet 5 mg  5 mg Oral TID PRN Peter Garter, MD   5 mg at 06/14/18 2121   ??? esomeprazole (NEXIUM) granules 40 mg  40 mg Oral daily Peter Garter, MD   Stopped at 06/15/18 1610   ??? fluticasone propionate (FLONASE) 50 mcg/actuation nasal spray 2 spray  2 spray Each Nare Daily Anell Barr, MD   2 spray at 06/15/18 0840   ??? gabapentin (NEURONTIN) oral solution  300 mg G-tube TID Anell Barr, MD   300 mg at 06/15/18 0840   ??? levothyroxine (SYNTHROID, LEVOTHROID) tablet 75 mcg  75 mcg G-tube Daily Anell Barr, MD   75 mcg at 06/15/18 0840   ??? lidocaine (LIDODERM) 5 % patch 1 patch  1 patch Transdermal Daily Anell Barr, MD   1 patch at 06/12/18 0859   ??? LORazepam (ATIVAN) tablet 0.5 mg  0.5 mg G-tube BID Anell Barr, MD   0.5 mg at 06/15/18 0840   ??? melatonin tablet 3 mg  3 mg Oral QPM Anell Barr, MD   3 mg at 06/14/18 2121   ??? minocycline (MINOCIN,DYNACIN) capsule 100 mg  100 mg G-tube Nightly Anell Barr, MD   100 mg at 06/14/18 2134   ??? mirtazapine (REMERON) tablet 15 mg  15 mg G-tube Nightly Anell Barr, MD   15 mg at 06/14/18 2121   ??? montelukast (SINGULAIR) tablet 10 mg  10 mg Oral Nightly Anell Barr, MD   10 mg at 06/14/18 2121   ??? ondansetron (ZOFRAN-ODT) disintegrating tablet 4 mg  4 mg Oral Q8H PRN Anell Barr, MD       ??? oxyCODONE (ROXICODONE) immediate release tablet 5 mg  5 mg G-tube Q4H PRN Anell Barr, MD   5 mg at 06/14/18 1623   ??? promethazine (PHENERGAN) injection 12.5 mg  12.5 mg Intravenous Q6H PRN Anell Barr, MD   12.5 mg at 06/15/18 1029   ??? SMOG ENEMA  240 mL Rectal Daily PRN Peter Garter, MD   240 mL at 06/13/18 2050   ??? sodium chloride 3 % nebulizer solution 4 mL  4 mL Nebulization Q6H (RT) Anell Barr, MD   4 mL at 06/15/18 0945   ??? tobramycin (PF) (TOBI) 300 mg/5 mL nebulizer solution 300 mg  300 mg Nebulization Q12H [facility] Anell Barr, MD   300 mg at 06/15/18 1031   ??? triamcinolone (KENALOG) 0.1 % ointment 1 application  1 application Topical BID Anell Barr, MD   1 application at 06/15/18 [phone]   ??? valproate (DEPAKENE) oral syrup  250 mg G-tube Q24H Anell Barr, MD   250 mg at 06/15/18 0840   ??? valproate (DEPAKENE) oral syrup  750 mg G-tube Q24H Anell Barr, MD   750 mg at 06/14/18 2122       Past Medical History:   Diagnosis Date   ??? ADHD    ??? Asthma    ??? Bipolar 1 disorder (CMS-HCC)    ??? Chronic tracheobronchitis (CMS-HCC)    ??? Constipation    ??? De Quervain's tenosynovitis    ??? Dysphagia    ??? Esophageal dysmotility    ???  Esophageal stricture    ??? GERD (gastroesophageal reflux disease)    ??? hypothyroid    ??? Seizures (CMS-HCC)    ??? Tracheobronchomalacia    ??? VATER syndrome    ??? Ventilator dependence (CMS-HCC)       Social History     Tobacco Use   ??? Smoking status: Never Smoker   ??? Smokeless tobacco: Never Used   Substance Use Topics   ??? Alcohol use: Yes     Alcohol/week: 1.2 oz     Types: 2 Cans of beer per week     Comment: on weekends sometimes     Past Surgical History:   Procedure Laterality Date   ??? ANAL DILATION  01/21/2006   ??? ESOPHAGOGASTRODUODENOSCOPY      multiple   ??? HIP SURGERY Right 01/19/2017    shaved   ??? LATERAL RECTUS RECESSION  11/15/2003   ??? NISSEN FUNDOPLICATION  2001    x 2 redos   ??? PORTACATH PLACEMENT  01/21/2006    taken out same year   ??? PR ESOPHAGEAL MOTILITY STUDY, MANOMETRY N/A 04/23/2018    Procedure: ESOPHAGEAL MOTILITY STUDY W/INT & REP;  Surgeon: Nurse-Based Giproc;  [facility];  Service: Gastroenterology   ??? PR UP GI ENDOSCOPY,BALL DIL,30MM N/A 02/22/2018    Procedure: UGI ENDO; W/BALLOON DILAT ESOPHAGUS (<30MM DIAM);  Surgeon: Zetta Bills, MD;  [facility];  Service: Gastroenterology   ??? PR UP GI ENDOSCOPY,BALL DIL,30MM N/A 05/20/2018    Procedure: UGI ENDO; W/BALLOON DILAT ESOPHAGUS (<30MM DIAM);  Surgeon: Rona Ravens, MD;  [facility];  Service: Gastroenterology   ??? REMOVAL VENOUS ACCESS PORT [facility] HISTORICAL RESULT)  11/03/2006   ??? STRABISMUS SURGERY Right    ??? TRACHEOSTOMY  1994   ??? WISDOM TOOTH EXTRACTION  04/2012   ??? WRIST SURGERY      3 times.       Family History   Adopted: Yes       Xopenex [levalbuterol hcl]; Allopurinol analogues; and Versed [midazolam]     Medical Tests / Procedures Comments: CXR (7/2): No acute airspace disease.     Equipment/Environment: Ventilatory support(at night)       Precautions:   Precautions: Aspiration precautions        Objective:  Trach Make and Type: Bivona  Trach Size: 6  Trach Length: Regular length  Trach Status Upon Arrival: No Tracheal Suctioning Needed Prior to Session(Cuffless)  Trach Status During SLP Intervention: Upper Airway Patency without Finger Occlusion  Secretion Status: (n/a)  % Oxygen via TC: (n/a)  Vent Support: Nocturnal Ventilation  Test for: Upper Airway Patency Pass;Voicing Pass    Start of Trial  Oxygen Saturation: 98  Pulse: 93  Respiratory Rate: 22    Trial 1 Minute  Oxygen Saturation: 98(Sensor fell off)  Pulse: 101  Respiratory Rate: 17  Subjective Response: Same    Trial 5 Minute  Oxygen Saturation: 98  Pulse: 98  Respiratory Rate: 19  Subjective Response: Same         I attest that I have reviewed the above information.  Signed: Gabriel Rung, CCC-SLP  Filed 06/15/2018

## 2018-06-16 NOTE — Unmapped (Signed)
Pt received scheduled inhalation medication as ordered pt also preforms self trach care with sterile tech, no c/o discomfort noted this shift. Pt also noted to self sx as needed.

## 2018-06-16 NOTE — Unmapped (Signed)
Pt placed on trilogy qhs, pt removed PMV and placed at bedside, no complications noted.

## 2018-06-16 NOTE — Unmapped (Signed)
Patient neurologically and hemodynamically stable.ambulates without assistance. Tolerating room air. Lung sounds diminished.tolerating diet and meds. One dose phenergan given effectively for nausea. Denies pain. Voids independently. Skin grossly intact.will continue to monitor  Problem: Adult Inpatient Plan of Care  Goal: Plan of Care Review  Outcome: Progressing  Goal: Patient-Specific Goal (Individualization)  Outcome: Progressing  Goal: Absence of Hospital-Acquired Illness or Injury  Outcome: Progressing  Goal: Optimal Comfort and Wellbeing  Outcome: Progressing  Goal: Readiness for Transition of Care  Outcome: Progressing  Goal: Rounds/Family Conference  Outcome: Progressing     Problem: Venous Thromboembolism  Goal: VTE (Venous Thromboembolism) Symptom Resolution  Outcome: Progressing     Problem: Fall Injury Risk  Goal: Absence of Fall and Fall-Related Injury  Outcome: Progressing     Problem: Breathing Pattern Ineffective  Goal: Effective Breathing Pattern  Outcome: Progressing     Problem: Asthma Comorbidity  Goal: Maintenance of Asthma Control  Outcome: Progressing     Problem: Communication Impairment (Mechanical Ventilation, Invasive)  Goal: Effective Communication  Outcome: Progressing     Problem: Ventilator-Induced Lung Injury (Mechanical Ventilation, Invasive)  Goal: Absence of Ventilator-Induced Lung Injury  Outcome: Progressing     Problem: Infection  Goal: Infection Symptom Resolution  Outcome: Progressing     Problem: Nausea and Vomiting  Goal: Fluid and Electrolyte Balance  Outcome: Progressing

## 2018-06-16 NOTE — Unmapped (Signed)
Pulmonology Daily Progress Note     LOS: 8 days under inpatient status    Assessment/Plan:  Principal Problem:    Chronic tracheobronchitis (CMS-HCC)  Active Problems:    Hypothyroidism    Mood disorder (CMS-HCC)    VATER syndrome  Resolved Problems:    * No resolved hospital problems. *         *Acute tracheobronchitis, recurrent.  Resolving.??  -Continue IV ceftriaxone and metronidazole through 7/17 to complete 14d course.  -Continue airway clearance w Metaneb, 3% saline nebs.  -Continue home albuterol/ipratropium, budesonide, tobramycin nebs, azithro MWF, cetirizine, montelukast.  -Continue Trilogy home vent qhs.  -Due to repeated episodes of tracheobronchitis, need for IV abx, per primary Pulm team will need Port-a-cath placed; however, VIR wishes that this be done just prior to D/C (7/17) and that PICC be placed in meantime.  ??  *VATER syndrome. ??Achalasia, esophageal stenosis.  -Continue PPI.  ??  *Hypothyroid.  -Continue levothyroxine.  ??  *Bipolar.  Stable.  -Continue citalopram, VPA, mirtazapine, lorazepam.  ??  *Acne.  -Continue home minocycline.  ??  *DVT prophylaxis.  -Enoxaparin.  ??  *Dispo. ??To remain in-house for duration of IV abx per primary Pulm team and then to D/C home after that.  ????  Please page [phone] with any questions.    [name], MD  ??    Subjective:   No chest pain, shortness of breath.  No change in secretions but overall doing well.    Objective:   Physical Exam:  General: Lying comfortably in bed, no acute distress.   Eyes: Anicteric sclerae, no conjunctival injection.    ENT: Mucous membranes moist.     Neck: Tracheostomy   Respiratory: Normal respiratory effort.  Clear to auscultation bilaterally without wheezes or crackles.    Cardiovascular: Regular rhythm without murmur.  No lower extremity edema.   Gastrointestinal: Nondistended.   Skin: No obvious rashes or breakdown.  Warm, dry.   Musculoskeletal: Motor strength in all extremities grossly intact.   Psychiatric: Alert.  Answers questions appropriately.  Judgment and insight intact.   Neurologic: Extraocular movements intact, no facial asymmetry.         Vital signs in last 24 hours:  Temp:  [36.6 ??C (97.9 ??F)-36.7 ??C (98.1 ??F)] 36.7 ??C (98.1 ??F)  Heart Rate:  [80-113] 88  SpO2 Pulse:  [80-113] 86  Resp:  [10-18] 14  BP: (125-152)/(62-93) 140/63  MAP (mmHg):  [77-108] 77  FiO2 (%):  [28 %] 28 %  SpO2:  [97 %-100 %] 97 %    Intake/Output last 24 hours:    Intake/Output Summary (Last 24 hours) at 06/16/2018 1402  Last data filed at 06/16/2018 1200  Gross per 24 hour   Intake 750 ml   Output 2375 ml   Net -1625 ml       Most recent recorded weights:  Last 5 Recorded Weights    06/08/18 1733 06/09/18 1932 06/11/18 0000   Weight: 94.1 kg (207 lb 7.3 oz) 96.6 kg (212 lb 15.4 oz) 98.8 kg (217 lb 13 oz)       Medications:   Scheduled Meds:  ??? albuterol  2.5 mg Nebulization Q6H (RT)    And   ??? ipratropium  500 mcg Nebulization Q6H (RT)   ??? arformoterol  15 mcg Nebulization BID (RT)   ??? azithromycin  500 mg G-tube Once per day on Mon Wed Fri   ??? budesonide  1 mg Nebulization BID   ??? cefTRIAXone  1  g Intravenous Q24H    And   ??? metronidazole  500 mg Intravenous Q8H   ??? cetirizine  10 mg G-tube QPM   ??? cholecalciferol (vitamin D3)  6,000 Units G-tube Daily   ??? citalopram  40 mg G-tube Nightly   ??? clindamycin   Topical BID   ??? esomeprazole  40 mg Oral daily   ??? fluticasone propionate  2 spray Each Nare Daily   ??? gabapentin  300 mg G-tube TID   ??? levothyroxine  75 mcg G-tube Daily   ??? lidocaine  1 patch Transdermal Daily   ??? LORazepam  0.5 mg G-tube BID   ??? melatonin  3 mg Oral QPM   ??? minocycline  100 mg G-tube Nightly   ??? mirtazapine  15 mg G-tube Nightly   ??? montelukast  10 mg Oral Nightly   ??? sodium chloride  4 mL Nebulization Q6H (RT)   ??? tobramycin (PF)  300 mg Nebulization Q12H [facility]   ??? triamcinolone  1 application Topical BID   ??? valproate  250 mg G-tube Q24H   ??? valproate  750 mg G-tube Q24H

## 2018-06-17 NOTE — Unmapped (Signed)
Patient on home vent while sleeping. While awake patient capped and on room air.  # 6 bivona. RT managing patient's breathing treatment. Patient able to eat solid food but medications are being given via mickey button. PIC line patent. Patient alert and oriented. VSS. Voiding in urinal. Large pink area around mickey button, healing, cream applied as ordered. Split gauze changed prn to keep skin dry. Phenergan given x1 for c/o nausea with patient verbalizing relief. Ambulating independently with no issues. PO intake good.     Problem: Adult Inpatient Plan of Care  Goal: Plan of Care Review  Outcome: Progressing  Goal: Patient-Specific Goal (Individualization)  Outcome: Progressing  Goal: Absence of Hospital-Acquired Illness or Injury  Outcome: Progressing  Goal: Optimal Comfort and Wellbeing  Outcome: Progressing  Goal: Readiness for Transition of Care  Outcome: Progressing  Goal: Rounds/Family Conference  Outcome: Progressing     Problem: Venous Thromboembolism  Goal: VTE (Venous Thromboembolism) Symptom Resolution  Outcome: Progressing     Problem: Fall Injury Risk  Goal: Absence of Fall and Fall-Related Injury  Outcome: Progressing     Problem: Breathing Pattern Ineffective  Goal: Effective Breathing Pattern  Outcome: Progressing     Problem: Asthma Comorbidity  Goal: Maintenance of Asthma Control  Outcome: Progressing     Problem: Communication Impairment (Mechanical Ventilation, Invasive)  Goal: Effective Communication  Outcome: Progressing     Problem: Ventilator-Induced Lung Injury (Mechanical Ventilation, Invasive)  Goal: Absence of Ventilator-Induced Lung Injury  Outcome: Progressing     Problem: Infection  Goal: Infection Symptom Resolution  Outcome: Progressing     Problem: Nausea and Vomiting  Goal: Fluid and Electrolyte Balance  Outcome: Progressing

## 2018-06-17 NOTE — Unmapped (Signed)
Pulmonology Daily Progress Note     LOS: 9 days under inpatient status    Assessment/Plan:  Principal Problem:    Chronic tracheobronchitis (CMS-HCC)  Active Problems:    Hypothyroidism    Mood disorder (CMS-HCC)    VATER syndrome  Resolved Problems:    * No resolved hospital problems. *         *Acute tracheobronchitis, recurrent.  Resolving.??  -Continue IV ceftriaxone and metronidazole through 7/17 to complete 14d course.  -Continue airway clearance w Metaneb, 3% saline nebs.  -Continue home albuterol/ipratropium, budesonide, tobramycin nebs, azithro MWF, cetirizine, montelukast.  -Continue Trilogy home vent qhs.  -Due to repeated episodes of tracheobronchitis, need for IV abx, per primary Pulm team will need Port-a-cath placed; however, VIR wishes that this be done just prior to D/C (7/17) and that PICC be placed in meantime.  ??  *VATER syndrome. ??Achalasia, esophageal stenosis.  -Continue PPI.  ??  *Hypothyroid.  -Continue levothyroxine.  ??  *Bipolar.  Stable.  -Continue citalopram, VPA, mirtazapine, lorazepam.  ??  *Acne.  -Continue home minocycline.  ??  *DVT prophylaxis.  -Enoxaparin.  ??  *Dispo. ??To remain in-house for duration of IV abx per primary Pulm team and then to D/C home after that.  ????  Please page [phone] with any questions.    Pablo Ledger, MD  ??    Subjective:   No chest pain, shortness of breath.  No change in secretions but overall doing well.  Eating well     Objective:   Physical Exam:  General: Lying comfortably in bed, no acute distress.   Eyes: Anicteric sclerae, no conjunctival injection.    ENT: Mucous membranes moist.     Neck: Tracheostomy   Respiratory: Normal respiratory effort.  Clear to auscultation bilaterally without wheezes or crackles.    Cardiovascular: Regular rhythm without murmur.  No lower extremity edema.   Gastrointestinal: Nondistended.   Skin: No obvious rashes or breakdown.  Warm, dry.   Musculoskeletal: Motor strength in all extremities grossly intact. Psychiatric: Alert.  Answers questions appropriately.  Judgment and insight intact.   Neurologic: Extraocular movements intact, no facial asymmetry.         Vital signs in last 24 hours:  Temp:  [36.6 ??C (97.8 ??F)-37.1 ??C (98.8 ??F)] 36.6 ??C (97.9 ??F)  Heart Rate:  [88-115] 92  SpO2 Pulse:  [46-111] 46  Resp:  [16-22] 16  BP: (115-144)/(43-84) 119/43  MAP (mmHg):  [68-100] 68  FiO2 (%):  [21 %-28 %] 21 %  SpO2:  [98 %-100 %] 100 %    Intake/Output last 24 hours:    Intake/Output Summary (Last 24 hours) at 06/17/2018 1306  Last data filed at 06/17/2018 1000  Gross per 24 hour   Intake 580 ml   Output 1550 ml   Net -970 ml       Most recent recorded weights:  Last 5 Recorded Weights    06/08/18 1733 06/09/18 1932 06/11/18 0000   Weight: 94.1 kg (207 lb 7.3 oz) 96.6 kg (212 lb 15.4 oz) 98.8 kg (217 lb 13 oz)       Medications:   Scheduled Meds:  ??? albuterol  2.5 mg Nebulization Q6H (RT)    And   ??? ipratropium  500 mcg Nebulization Q6H (RT)   ??? arformoterol  15 mcg Nebulization BID (RT)   ??? azithromycin  500 mg G-tube Once per day on Mon Wed Fri   ??? budesonide  1 mg Nebulization BID   ???  cefTRIAXone  1 g Intravenous Q24H    And   ??? metronidazole  500 mg Intravenous Q8H   ??? cetirizine  10 mg G-tube QPM   ??? cholecalciferol (vitamin D3)  6,000 Units G-tube Daily   ??? citalopram  40 mg G-tube Nightly   ??? clindamycin   Topical BID   ??? esomeprazole  40 mg Oral daily   ??? fluticasone propionate  2 spray Each Nare Daily   ??? gabapentin  300 mg G-tube TID   ??? levothyroxine  75 mcg G-tube Daily   ??? lidocaine  1 patch Transdermal Daily   ??? LORazepam  0.5 mg G-tube BID   ??? melatonin  3 mg Oral QPM   ??? minocycline  100 mg G-tube Nightly   ??? mirtazapine  15 mg G-tube Nightly   ??? montelukast  10 mg Oral Nightly   ??? sodium chloride  4 mL Nebulization Q6H (RT)   ??? tobramycin (PF)  300 mg Nebulization Q12H [facility]   ??? triamcinolone  1 application Topical BID   ??? valproate  250 mg G-tube Q24H   ??? valproate  750 mg G-tube Q24H

## 2018-06-18 NOTE — Unmapped (Signed)
Pt stable on room air,normotensive,afebrile.  Required suction x1. Tolerating regular diet and getting meds via g-tube. Voiding independently. Denies pain.skin grossly intact.will continue to monitor  Problem: Adult Inpatient Plan of Care  Goal: Plan of Care Review  Outcome: Progressing  Goal: Patient-Specific Goal (Individualization)  Outcome: Progressing  Goal: Absence of Hospital-Acquired Illness or Injury  Outcome: Progressing  Goal: Optimal Comfort and Wellbeing  Outcome: Progressing  Goal: Readiness for Transition of Care  Outcome: Progressing  Goal: Rounds/Family Conference  Outcome: Progressing     Problem: Venous Thromboembolism  Goal: VTE (Venous Thromboembolism) Symptom Resolution  Outcome: Progressing     Problem: Fall Injury Risk  Goal: Absence of Fall and Fall-Related Injury  Outcome: Progressing     Problem: Breathing Pattern Ineffective  Goal: Effective Breathing Pattern  Outcome: Progressing     Problem: Asthma Comorbidity  Goal: Maintenance of Asthma Control  Outcome: Progressing     Problem: Communication Impairment (Mechanical Ventilation, Invasive)  Goal: Effective Communication  Outcome: Progressing     Problem: Ventilator-Induced Lung Injury (Mechanical Ventilation, Invasive)  Goal: Absence of Ventilator-Induced Lung Injury  Outcome: Progressing     Problem: Infection  Goal: Infection Symptom Resolution  Outcome: Progressing     Problem: Nausea and Vomiting  Goal: Fluid and Electrolyte Balance  Outcome: Progressing

## 2018-06-18 NOTE — Unmapped (Signed)
Problem: Adult Inpatient Plan of Care  Goal: Plan of Care Review  Outcome: Progressing  Goal: Readiness for Transition of Care  Outcome: Progressing  Intervention: Mutually Develop Transition Plan  Flowsheets  Taken 06/09/2018 1059 by Milinda Antis, MSW  Concerns to be Addressed: discharge planning  Taken 06/18/2018 0533 by Sherol Dade, RN BSN  Equipment Currently Used at Home: colostomy/ostomy supplies;other (see comments) (PICC for abx.)  Note:   Doing very well. Refused vent overnight.     Problem: Fall Injury Risk  Goal: Absence of Fall and Fall-Related Injury  Outcome: Progressing  Intervention: Identify and Manage Contributors to Fall Injury Risk  Flowsheets  Taken 06/16/2018 0400 by Dorena Bodo Ward, RN  Self-Care Promotion: independence encouraged  Taken 06/18/2018 0533 by Sherol Dade, RN BSN  Medication Review/Management: medications reviewed;high risk medications identified  Note:   Pt understands own limits.     Problem: Nausea and Vomiting  Goal: Fluid and Electrolyte Balance  Outcome: Progressing  Intervention: Prevent and Manage Nausea and Vomiting  Flowsheets (Taken 06/16/2018 0400 by Dorena Bodo Ward, RN)  Environmental Support: calm environment promoted;caregiver consistency promoted;comfort object encouraged;distractions minimized;environmental consistency promoted;personal routine supported;rest periods encouraged;rooming-in facilitated  Note:   Phenergan seem to work well for pt.

## 2018-06-18 NOTE — Unmapped (Signed)
Pt received scheduled inhalation medication as ordeed

## 2018-06-18 NOTE — Unmapped (Signed)
All scheduled treatments given as ordered with no distress or complications noted.Patient remained on trach collar entire shift with no complications.Patient refused to switch to trilogy.

## 2018-06-18 NOTE — Unmapped (Signed)
Pulmonology Daily Progress Note     LOS: 10 days under inpatient status    Assessment/Plan:  Principal Problem:    Chronic tracheobronchitis (CMS-HCC)  Active Problems:    Hypothyroidism    Mood disorder (CMS-HCC)    VATER syndrome  Resolved Problems:    * No resolved hospital problems. *         *Acute tracheobronchitis, recurrent.  Resolving.??  -Continue IV ceftriaxone and metronidazole through 7/17 to complete 14d course.  -Continue airway clearance w Metaneb, 3% saline nebs.  -Continue home albuterol/ipratropium, budesonide, tobramycin nebs, azithro MWF, cetirizine, montelukast.  -Continue Trilogy home vent qhs.  -Due to repeated episodes of tracheobronchitis, need for IV abx, per primary Pulm team will need Port-a-cath placed; however, [facility] wishes that this be done just prior to D/C (7/17) and that PICC be placed in meantime.  ??  *VATER syndrome. ??Achalasia, esophageal stenosis.  -Continue PPI.  ??  *Hypothyroid.  -Continue levothyroxine.  ??  *Bipolar.  Stable.  -Continue citalopram, VPA, mirtazapine, lorazepam.  ??  *Acne.  -Continue home minocycline.  ??  *DVT prophylaxis.  -Enoxaparin.  ??  *Dispo. ??To remain in-house for duration of IV abx per primary Pulm team and then to D/C home after that.  ????  Please page [phone] with any questions.    [name], MD  ??    Subjective:   No chest pain, shortness of breath.  No change in secretions but overall doing well.  Eating well     Objective:   Physical Exam:  General: Lying comfortably in bed, no acute distress.   Eyes: Anicteric sclerae, no conjunctival injection.    ENT: Mucous membranes moist.     Neck: Tracheostomy   Respiratory: Normal respiratory effort.  Clear to auscultation bilaterally without wheezes or crackles.    Cardiovascular: Regular rhythm without murmur.  No lower extremity edema.   Gastrointestinal: Nondistended.   Skin: No obvious rashes or breakdown.  Warm, dry.   Musculoskeletal: Motor strength in all extremities grossly intact.   Psychiatric: Alert.  Answers questions appropriately.  Judgment and insight intact.   Neurologic: Extraocular movements intact, no facial asymmetry.         Vital signs in last 24 hours:  Temp:  [36.6 ??C (97.9 ??F)-37.4 ??C (99.3 ??F)] 36.8 ??C (98.2 ??F)  Heart Rate:  [79-106] 100  SpO2 Pulse:  [83-101] 101  Resp:  [11-20] 14  BP: (122-161)/(66-122) 161/122  MAP (mmHg):  [87-133] 133  FiO2 (%):  [28 %] 28 %  SpO2:  [96 %-100 %] 97 %    Intake/Output last 24 hours:    Intake/Output Summary (Last 24 hours) at 06/18/2018 1345  Last data filed at 06/18/2018 1006  Gross per 24 hour   Intake 540 ml   Output 2200 ml   Net -1660 ml       Most recent recorded weights:  Last 5 Recorded Weights    06/08/18 1733 06/09/18 1932 06/11/18 0000   Weight: 94.1 kg (207 lb 7.3 oz) 96.6 kg (212 lb 15.4 oz) 98.8 kg (217 lb 13 oz)       Medications:   Scheduled Meds:  ??? albuterol  2.5 mg Nebulization Q6H (RT)    And   ??? ipratropium  500 mcg Nebulization Q6H (RT)   ??? arformoterol  15 mcg Nebulization BID (RT)   ??? azithromycin  500 mg G-tube Once per day on Mon Wed Fri   ??? budesonide  1 mg Nebulization BID   ???  cefTRIAXone  1 g Intravenous Q24H    And   ??? metronidazole  500 mg Intravenous Q8H   ??? cetirizine  10 mg G-tube QPM   ??? cholecalciferol (vitamin D3)  6,000 Units G-tube Daily   ??? citalopram  40 mg G-tube Nightly   ??? clindamycin   Topical BID   ??? esomeprazole  40 mg Oral daily   ??? fluticasone propionate  2 spray Each Nare Daily   ??? gabapentin  300 mg G-tube TID   ??? levothyroxine  75 mcg G-tube Daily   ??? lidocaine  1 patch Transdermal Daily   ??? LORazepam  0.5 mg G-tube BID   ??? melatonin  3 mg Oral QPM   ??? mirtazapine  15 mg G-tube Nightly   ??? montelukast  10 mg Oral Nightly   ??? sodium chloride  4 mL Nebulization Q6H (RT)   ??? tobramycin (PF)  300 mg Nebulization Q12H [facility]   ??? triamcinolone  1 application Topical BID   ??? valproate  250 mg G-tube Q24H   ??? valproate  750 mg G-tube Q24H

## 2018-06-19 DIAGNOSIS — R05 Cough: Principal | ICD-10-CM

## 2018-06-19 NOTE — Unmapped (Signed)
Pulmonology Daily Progress Note     LOS: 11 days under inpatient status    Assessment/Plan:  Principal Problem:    Chronic tracheobronchitis (CMS-HCC)  Active Problems:    Hypothyroidism    Mood disorder (CMS-HCC)    VATER syndrome  Resolved Problems:    * No resolved hospital problems. *         *Acute tracheobronchitis, recurrent.  Resolving.??  -Continue IV ceftriaxone and metronidazole through 7/17 to complete 14d course.  -Continue airway clearance w Metaneb, 3% saline nebs.  -Continue home albuterol/ipratropium, budesonide, tobramycin nebs, azithro MWF, cetirizine, montelukast.  -Continue Trilogy home vent qhs.  -Due to repeated episodes of tracheobronchitis, need for IV abx, per primary Pulm team will need Port-a-cath placed; however, [facility] wishes that this be done just prior to D/C (7/17) and that PICC be placed in meantime.  ??  *VATER syndrome. ??Achalasia, esophageal stenosis.  -Continue PPI.  ??  *Hypothyroid.  -Continue levothyroxine.  ??  *Bipolar.  Stable.  -Continue citalopram, VPA, mirtazapine, lorazepam.  ??  *Acne.  -Continue home minocycline.  ??  *DVT prophylaxis.  -Enoxaparin.  ??  *Dispo. ??To remain in-house for duration of IV abx per primary Pulm team and then to D/C home after that.  ????  Please page [phone] with any questions.    Lind Covert, MD  ??    Subjective:   No chest pain, shortness of breath.  No change in secretions but overall doing well.  Eating well     Objective:   Physical Exam:  General: Lying comfortably in bed, no acute distress.   Eyes: Anicteric sclerae, no conjunctival injection.    ENT: Mucous membranes moist.     Neck: Tracheostomy   Respiratory: Normal respiratory effort.  Clear to auscultation bilaterally without wheezes or crackles.    Cardiovascular: Regular rhythm without murmur.  No lower extremity edema.   Gastrointestinal: Nondistended.   Skin: No obvious rashes or breakdown.  Warm, dry.   Musculoskeletal: Motor strength in all extremities grossly intact.   Psychiatric: Alert.  Answers questions appropriately.  Judgment and insight intact.   Neurologic: Extraocular movements intact, no facial asymmetry.         Vital signs in last 24 hours:  Temp:  [36.3 ??C (97.4 ??F)-37.1 ??C (98.8 ??F)] 36.3 ??C (97.4 ??F)  Heart Rate:  [71-106] 71  SpO2 Pulse:  [98-107] 98  Resp:  [13-27] 13  BP: (109-161)/(48-122) 109/48  MAP (mmHg):  [66-133] 66  FiO2 (%):  [28 %] 28 %  SpO2:  [96 %-99 %] 96 %    Intake/Output last 24 hours:    Intake/Output Summary (Last 24 hours) at 06/19/2018 1045  Last data filed at 06/19/2018 0400  Gross per 24 hour   Intake 670 ml   Output 1400 ml   Net -730 ml       Most recent recorded weights:  Last 5 Recorded Weights    06/08/18 1733 06/09/18 1932 06/11/18 0000   Weight: 94.1 kg (207 lb 7.3 oz) 96.6 kg (212 lb 15.4 oz) 98.8 kg (217 lb 13 oz)       Medications:   Scheduled Meds:  ??? albuterol  2.5 mg Nebulization Q6H (RT)    And   ??? ipratropium  500 mcg Nebulization Q6H (RT)   ??? arformoterol  15 mcg Nebulization BID (RT)   ??? azithromycin  500 mg G-tube Once per day on Mon Wed Fri   ??? budesonide  1 mg Nebulization BID   ???  cefTRIAXone  1 g Intravenous Q24H    And   ??? metronidazole  500 mg Intravenous Q8H   ??? cetirizine  10 mg G-tube QPM   ??? cholecalciferol (vitamin D3)  6,000 Units G-tube Daily   ??? citalopram  40 mg G-tube Nightly   ??? clindamycin   Topical BID   ??? esomeprazole  40 mg Oral daily   ??? fluticasone propionate  2 spray Each Nare Daily   ??? gabapentin  300 mg G-tube TID   ??? levothyroxine  75 mcg G-tube Daily   ??? lidocaine  1 patch Transdermal Daily   ??? LORazepam  0.5 mg G-tube BID   ??? melatonin  3 mg Oral QPM   ??? mirtazapine  15 mg G-tube Nightly   ??? montelukast  10 mg Oral Nightly   ??? sodium chloride  4 mL Nebulization Q6H (RT)   ??? tobramycin (PF)  300 mg Nebulization Q12H [facility]   ??? triamcinolone  1 application Topical BID   ??? valproate  250 mg G-tube Q24H   ??? valproate  750 mg G-tube Q24H

## 2018-06-19 NOTE — Unmapped (Signed)
All scheduled treatments given as ordered with mo complications or distress noted this shift.Patient did treatments via metaneb and placed on trilogy at hour of sleep.

## 2018-06-19 NOTE — Unmapped (Signed)
Co of right lower chest pain; Xray and CT scan completed; morphine provided for pain;    Slept til 1300; walked around the unit and then down to cafe.    All meds taken except lidocaine patch      Problem: Adult Inpatient Plan of Care  Goal: Plan of Care Review  Outcome: Progressing  Goal: Patient-Specific Goal (Individualization)  Outcome: Progressing  Goal: Absence of Hospital-Acquired Illness or Injury  Outcome: Progressing  Goal: Readiness for Transition of Care  Outcome: Progressing  Goal: Rounds/Family Conference  Outcome: Progressing     Problem: Venous Thromboembolism  Goal: VTE (Venous Thromboembolism) Symptom Resolution  Outcome: Progressing     Problem: Fall Injury Risk  Goal: Absence of Fall and Fall-Related Injury  Outcome: Progressing     Problem: Breathing Pattern Ineffective  Goal: Effective Breathing Pattern  Outcome: Progressing     Problem: Asthma Comorbidity  Goal: Maintenance of Asthma Control  Outcome: Progressing     Problem: Communication Impairment (Mechanical Ventilation, Invasive)  Goal: Effective Communication  Outcome: Progressing     Problem: Ventilator-Induced Lung Injury (Mechanical Ventilation, Invasive)  Goal: Absence of Ventilator-Induced Lung Injury  Outcome: Progressing     Problem: Infection  Goal: Infection Symptom Resolution  Outcome: Progressing

## 2018-06-20 NOTE — Unmapped (Signed)
No acute events.  VSS,  UOP adequate.  No falls. Pt up ad lib.  Pt with increased pain from rib fractures.  Morphine and oxy given.  Pt on vent after midnight.  Will continue to monitor and assess.   Problem: Adult Inpatient Plan of Care  Goal: Plan of Care Review  Outcome: Progressing  Goal: Patient-Specific Goal (Individualization)  Outcome: Progressing  Goal: Absence of Hospital-Acquired Illness or Injury  Outcome: Progressing  Goal: Optimal Comfort and Wellbeing  Outcome: Progressing  Goal: Readiness for Transition of Care  Outcome: Progressing  Goal: Rounds/Family Conference  Outcome: Progressing     Problem: Venous Thromboembolism  Goal: VTE (Venous Thromboembolism) Symptom Resolution  Outcome: Progressing     Problem: Fall Injury Risk  Goal: Absence of Fall and Fall-Related Injury  Outcome: Progressing     Problem: Breathing Pattern Ineffective  Goal: Effective Breathing Pattern  Outcome: Progressing     Problem: Asthma Comorbidity  Goal: Maintenance of Asthma Control  Outcome: Progressing     Problem: Communication Impairment (Mechanical Ventilation, Invasive)  Goal: Effective Communication  Outcome: Progressing     Problem: Ventilator-Induced Lung Injury (Mechanical Ventilation, Invasive)  Goal: Absence of Ventilator-Induced Lung Injury  Outcome: Progressing     Problem: Infection  Goal: Infection Symptom Resolution  Outcome: Progressing     Problem: Nausea and Vomiting  Goal: Fluid and Electrolyte Balance  Outcome: Progressing

## 2018-06-20 NOTE — Unmapped (Signed)
Pulmonology Daily Progress Note     LOS: 12 days under inpatient status    Assessment/Plan:  Principal Problem:    Chronic tracheobronchitis (CMS-HCC)  Active Problems:    Hypothyroidism    Mood disorder (CMS-HCC)    VATER syndrome  Resolved Problems:    * No resolved hospital problems. *         *Acute tracheobronchitis, recurrent.  Resolving.??  -Continue IV ceftriaxone and metronidazole through 7/17 to complete 14d course.  -Continue airway clearance w Metaneb, 3% saline nebs.  -Continue home albuterol/ipratropium, budesonide, tobramycin nebs, azithro MWF, cetirizine, montelukast.  -Continue Trilogy home vent qhs.  -Due to repeated episodes of tracheobronchitis, need for IV abx, per primary Pulm team will need Port-a-cath placed; however, [facility] wishes that this be done just prior to D/C (7/17) and that PICC be placed in meantime.    *Chest discomfort - 7/13. CXR concerning for rib fracture; CT scan did not demonstrate acute fracture. Will wean off morphine for pain as this is likely MSK in etiology.  ??  *VATER syndrome. ??Achalasia, esophageal stenosis.  -Continue PPI.  ??  *Hypothyroid.  -Continue levothyroxine.  ??  *Bipolar.  Stable.  -Continue citalopram, VPA, mirtazapine, lorazepam.  ??  *Acne.  -Continue home minocycline.  ??  *DVT prophylaxis.  -Enoxaparin.  ??  *Dispo. ??To remain in-house for duration of IV abx per primary Pulm team and then to D/C home after that.  ????  Please page [phone] with any questions.    Lind Covert, MD  ??    Subjective:   No chest pain, shortness of breath.  No change in secretions but overall doing well.  Eating well     Objective:   Physical Exam:  General: Lying comfortably in bed, no acute distress.   Eyes: Anicteric sclerae, no conjunctival injection.    ENT: Mucous membranes moist.     Neck: Tracheostomy   Respiratory: Normal respiratory effort.  Clear to auscultation bilaterally without wheezes or crackles.    Cardiovascular: Regular rhythm without murmur.  No lower extremity edema.   Gastrointestinal: Nondistended.   Skin: No obvious rashes or breakdown.  Warm, dry.   Musculoskeletal: Motor strength in all extremities grossly intact.   Psychiatric: Alert.  Answers questions appropriately.  Judgment and insight intact.   Neurologic: Extraocular movements intact, no facial asymmetry.         Vital signs in last 24 hours:  Temp:  [36.6 ??C (97.9 ??F)-37.2 ??C (99 ??F)] 36.7 ??C (98.1 ??F)  Heart Rate:  [93-108] 93  SpO2 Pulse:  [93-105] 93  Resp:  [12-20] 12  BP: (137-163)/(78-90) 152/90  MAP (mmHg):  [95-112] 112  FiO2 (%):  [21 %-28 %] 28 %  SpO2:  [98 %-100 %] 99 %    Intake/Output last 24 hours:    Intake/Output Summary (Last 24 hours) at 06/20/2018 0821  Last data filed at 06/20/2018 0325  Gross per 24 hour   Intake 1490 ml   Output 2280 ml   Net -790 ml       Most recent recorded weights:  Last 5 Recorded Weights    06/08/18 1733 06/09/18 1932 06/11/18 0000   Weight: 94.1 kg (207 lb 7.3 oz) 96.6 kg (212 lb 15.4 oz) 98.8 kg (217 lb 13 oz)       Medications:   Scheduled Meds:  ??? albuterol  2.5 mg Nebulization Q6H (RT)    And   ??? ipratropium  500 mcg Nebulization Q6H (RT)   ???  arformoterol  15 mcg Nebulization BID (RT)   ??? azithromycin  500 mg G-tube Once per day on Mon Wed Fri   ??? budesonide  1 mg Nebulization BID   ??? cefTRIAXone  1 g Intravenous Q24H    And   ??? metronidazole  500 mg Intravenous Q8H   ??? cetirizine  10 mg G-tube QPM   ??? cholecalciferol (vitamin D3)  6,000 Units G-tube Daily   ??? citalopram  40 mg G-tube Nightly   ??? clindamycin   Topical BID   ??? esomeprazole  40 mg Oral daily   ??? fluticasone propionate  2 spray Each Nare Daily   ??? gabapentin  300 mg G-tube TID   ??? levothyroxine  75 mcg G-tube Daily   ??? lidocaine  1 patch Transdermal Daily   ??? LORazepam  0.5 mg G-tube BID   ??? melatonin  3 mg Oral QPM   ??? mirtazapine  15 mg G-tube Nightly   ??? montelukast  10 mg Oral Nightly   ??? sodium chloride  4 mL Nebulization Q6H (RT)   ??? tobramycin (PF)  300 mg Nebulization Q12H [facility]   ??? triamcinolone  1 application Topical BID   ??? valproate  250 mg G-tube Q24H   ??? valproate  750 mg G-tube Q24H

## 2018-06-20 NOTE — Unmapped (Signed)
All scheduled treatments given as ordered with no distress or complications noted.Patient placed on trilogy per home settings.Patient tobi was not taken because he has finished home regimen

## 2018-06-21 LAB — CBC
HEMATOCRIT: 37.1 % — ABNORMAL LOW (ref 41.0–53.0)
MEAN CORPUSCULAR HEMOGLOBIN CONC: 32 g/dL (ref 31.0–37.0)
MEAN CORPUSCULAR HEMOGLOBIN: 24.8 pg — ABNORMAL LOW (ref 26.0–34.0)
MEAN CORPUSCULAR VOLUME: 77.4 fL — ABNORMAL LOW (ref 80.0–100.0)
MEAN PLATELET VOLUME: 7.6 fL (ref 7.0–10.0)
PLATELET COUNT: 340 10*9/L (ref 150–440)
RED BLOOD CELL COUNT: 4.8 10*12/L (ref 4.50–5.90)
RED CELL DISTRIBUTION WIDTH: 16.1 % — ABNORMAL HIGH (ref 12.0–15.0)

## 2018-06-21 LAB — BASIC METABOLIC PANEL
ANION GAP: 9 mmol/L (ref 9–15)
BLOOD UREA NITROGEN: 11 mg/dL (ref 7–21)
BUN / CREAT RATIO: 16
CALCIUM: 9.7 mg/dL (ref 8.5–10.2)
CHLORIDE: 97 mmol/L — ABNORMAL LOW (ref 98–107)
CO2: 30 mmol/L (ref 22.0–30.0)
CREATININE: 0.68 mg/dL — ABNORMAL LOW (ref 0.70–1.30)
EGFR CKD-EPI NON-AA MALE: >90 mL/min/{1.73_m2} (ref >=60)
GLUCOSE RANDOM: 202 mg/dL — ABNORMAL HIGH (ref 65–179)
POTASSIUM: 4.7 mmol/L (ref 3.5–5.0)
SODIUM: 136 mmol/L (ref 135–145)

## 2018-06-21 LAB — ANION GAP: Anion gap 3:SCnc:Pt:Ser/Plas:Qn:: 9

## 2018-06-21 LAB — HEMOGLOBIN: Lab: 11.9 — ABNORMAL LOW

## 2018-06-21 NOTE — Unmapped (Signed)
Patient on home vent while sleeping. While awake patient capped and on room air.  # 6 bivona. RT managing patient's breathing treatment. Patient able to eat solid food but medications are being given via mickey button. PIC line patent.Dressing changed this afternoon.Patient alert and oriented. VSS. Voiding in urinal. Large pink area around mickey button, healing, cream applied as ordered. Split gauze changed prn to keep skin dry. Phenergan given for c/o nausea with patient verbalizing relief. Ambulating independently with no issues. PO intake good  Requiring  Problem: Adult Inpatient Plan of Care  Goal: Plan of Care Review  Outcome: Progressing  Goal: Patient-Specific Goal (Individualization)  Outcome: Progressing  Goal: Absence of Hospital-Acquired Illness or Injury  Outcome: Progressing  Goal: Optimal Comfort and Wellbeing  Outcome: Progressing  Goal: Readiness for Transition of Care  Outcome: Progressing  Goal: Rounds/Family Conference  Outcome: Progressing     Problem: Venous Thromboembolism  Goal: VTE (Venous Thromboembolism) Symptom Resolution  Outcome: Progressing     Problem: Fall Injury Risk  Goal: Absence of Fall and Fall-Related Injury  Outcome: Progressing     Problem: Breathing Pattern Ineffective  Goal: Effective Breathing Pattern  Outcome: Progressing     Problem: Asthma Comorbidity  Goal: Maintenance of Asthma Control  Outcome: Progressing     Problem: Communication Impairment (Mechanical Ventilation, Invasive)  Goal: Effective Communication  Outcome: Progressing     Problem: Ventilator-Induced Lung Injury (Mechanical Ventilation, Invasive)  Goal: Absence of Ventilator-Induced Lung Injury  Outcome: Progressing     Problem: Infection  Goal: Infection Symptom Resolution  Outcome: Progressing     Problem: Nausea and Vomiting  Goal: Fluid and Electrolyte Balance  Outcome: Progressing    oxy and morphine this am to treat left flank pain

## 2018-06-21 NOTE — Unmapped (Signed)
Not feeling well d/t lack of sleep    OOB and outside the unit 3 times today.      Problem: Adult Inpatient Plan of Care  Goal: Plan of Care Review  Outcome: Progressing  Goal: Patient-Specific Goal (Individualization)  Outcome: Progressing  Goal: Absence of Hospital-Acquired Illness or Injury  Outcome: Progressing  Goal: Optimal Comfort and Wellbeing  Outcome: Progressing  Goal: Readiness for Transition of Care  Outcome: Progressing  Goal: Rounds/Family Conference  Outcome: Progressing     Problem: Venous Thromboembolism  Goal: VTE (Venous Thromboembolism) Symptom Resolution  Outcome: Progressing     Problem: Fall Injury Risk  Goal: Absence of Fall and Fall-Related Injury  Outcome: Progressing     Problem: Breathing Pattern Ineffective  Goal: Effective Breathing Pattern  Outcome: Progressing     Problem: Asthma Comorbidity  Goal: Maintenance of Asthma Control  Outcome: Progressing     Problem: Communication Impairment (Mechanical Ventilation, Invasive)  Goal: Effective Communication  Outcome: Progressing     Problem: Ventilator-Induced Lung Injury (Mechanical Ventilation, Invasive)  Goal: Absence of Ventilator-Induced Lung Injury  Outcome: Progressing     Problem: Infection  Goal: Infection Symptom Resolution  Outcome: Progressing     Problem: Nausea and Vomiting  Goal: Fluid and Electrolyte Balance  Outcome: Progressing

## 2018-06-21 NOTE — Unmapped (Signed)
Pt is A & O X4. VSS. C/O rib pain that is managed with PRN PO and IV med. Pt ambulates in the hall independently. Remains stable. Continue on IV antibiotic. Tolerating well. Remains stable, cont to monitor. See flowsheet for full assessment. ( see MAR).   Problem: Adult Inpatient Plan of Care  Goal: Plan of Care Review  Outcome: Progressing     Problem: Venous Thromboembolism  Goal: VTE (Venous Thromboembolism) Symptom Resolution  Outcome: Progressing     Problem: Breathing Pattern Ineffective  Goal: Effective Breathing Pattern  Outcome: Progressing     Problem: Infection  Goal: Infection Symptom Resolution  Outcome: Progressing     Problem: Nausea and Vomiting  Goal: Fluid and Electrolyte Balance  Outcome: Progressing

## 2018-06-21 NOTE — Unmapped (Addendum)
[facility] [phone]) has been contacted to resume your previous home private duty nursing care and for port management with supplies.      Please contact [facility] at [phone] when supplies are needed for port care. They will need to order and ship to your home, please allow time for order and shipment prior to scheduling your port care.

## 2018-06-21 NOTE — Unmapped (Signed)
Pulmonology Daily Progress Note     LOS: 13 days under inpatient status    Assessment/Plan:  Principal Problem:    Chronic tracheobronchitis (CMS-HCC)  Active Problems:    Hypothyroidism    Mood disorder (CMS-HCC)    VATER syndrome  Resolved Problems:    * No resolved hospital problems. *         *Acute tracheobronchitis, recurrent.  Resolving.??  -Continue IV ceftriaxone and metronidazole through 7/17 to complete 14d course.  -Continue airway clearance w Metaneb, 3% saline nebs.  -Continue home albuterol/ipratropium, budesonide, tobramycin nebs, azithro MWF, cetirizine, montelukast.  -Continue Trilogy home vent qhs.  -Due to repeated episodes of tracheobronchitis, need for IV abx, per primary Pulm team will need Port-a-cath placed; however, [facility] wishes that this be done just prior to D/C (7/17) and that PICC be placed in meantime.    *Chest discomfort - 7/13. CXR concerning for rib fracture; CT scan did not demonstrate acute fracture. Will wean off morphine for pain as this is likely MSK in etiology.  ??  *VATER syndrome. ??Achalasia, esophageal stenosis.  -Continue PPI.  ??  *Hypothyroid.  -Continue levothyroxine.  ??  *Bipolar.  Stable.  -Continue citalopram, VPA, mirtazapine, lorazepam.  ??  *Acne.  -Continue home minocycline.  ??  *DVT prophylaxis.  -Enoxaparin.  ??  *Dispo. ??To remain in-house for duration of IV abx per primary Pulm team and then to D/C home after that.  ????  Please page [phone] with any questions.    [name], MD  ??    Subjective:   No chest pain, shortness of breath.  No change in secretions but overall doing well.  Eating well     Objective:   Physical Exam:  General: Lying comfortably in bed, no acute distress.   Eyes: Anicteric sclerae, no conjunctival injection.    ENT: Mucous membranes moist.     Neck: Tracheostomy   Respiratory: Normal respiratory effort.  Clear to auscultation bilaterally without wheezes or crackles.    Cardiovascular: Regular rhythm without murmur.  No lower extremity edema.   Gastrointestinal: Nondistended.   Skin: No obvious rashes or breakdown.  Warm, dry.   Musculoskeletal: Motor strength in all extremities grossly intact.   Psychiatric: Alert.  Answers questions appropriately.  Judgment and insight intact.   Neurologic: Extraocular movements intact, no facial asymmetry.         Vital signs in last 24 hours:  Temp:  [36.7 ??C (98.1 ??F)-37 ??C (98.6 ??F)] 36.8 ??C (98.3 ??F)  Heart Rate:  [89-114] 112  SpO2 Pulse:  [90-109] 90  Resp:  [8-22] 22  BP: (132-164)/(75-103) 154/93  MAP (mmHg):  [84-120] 105  FiO2 (%):  [28 %] 28 %  SpO2:  [92 %-100 %] 100 %    Intake/Output last 24 hours:    Intake/Output Summary (Last 24 hours) at 06/21/2018 1453  Last data filed at 06/21/2018 1227  Gross per 24 hour   Intake 1480 ml   Output 2021 ml   Net -541 ml       Most recent recorded weights:  Last 5 Recorded Weights    06/08/18 1733 06/09/18 1932 06/11/18 0000   Weight: 94.1 kg (207 lb 7.3 oz) 96.6 kg (212 lb 15.4 oz) 98.8 kg (217 lb 13 oz)       Medications:   Scheduled Meds:  ??? albuterol  2.5 mg Nebulization Q6H (RT)    And   ??? ipratropium  500 mcg Nebulization Q6H (RT)   ??? arformoterol  15 mcg Nebulization BID (RT)   ??? azithromycin  500 mg G-tube Once per day on Mon Wed Fri   ??? budesonide  1 mg Nebulization BID   ??? cefTRIAXone  1 g Intravenous Q24H    And   ??? metronidazole  500 mg Intravenous Q8H   ??? cetirizine  10 mg G-tube QPM   ??? cholecalciferol (vitamin D3)  6,000 Units G-tube Daily   ??? citalopram  40 mg G-tube Nightly   ??? clindamycin   Topical BID   ??? esomeprazole  40 mg Oral daily   ??? fluticasone propionate  2 spray Each Nare Daily   ??? gabapentin  300 mg G-tube TID   ??? levothyroxine  75 mcg G-tube Daily   ??? lidocaine  1 patch Transdermal Daily   ??? LORazepam  0.5 mg G-tube BID   ??? melatonin  3 mg Oral QPM   ??? mirtazapine  15 mg G-tube Nightly   ??? montelukast  10 mg Oral Nightly   ??? sodium chloride  4 mL Nebulization Q6H (RT)   ??? triamcinolone  1 application Topical BID   ??? valproate  250 mg G-tube Q24H   ??? valproate  750 mg G-tube Q24H

## 2018-06-22 DIAGNOSIS — R05 Cough: Principal | ICD-10-CM

## 2018-06-22 NOTE — Unmapped (Signed)
 INTERVENTIONAL RADIOLOGY   POST-PROCEDURE NOTE     Patient: Cameron Baker  DOB: 11-Jan-1991  Medical Record Number: [id number] Note Date / Time: June 22, 2018 12:32 PM     Procedure     Procedure: Port placement    Post Procedure Diagnosis:  tracheobronchitis    Attending: Terrilyn Saver     Fellow/Resident: Hira    Complications: none    Access: RIJ    Closure: port    EBL: minimal    Samples:  none    BRIEF DESCRIPTION:  Successul RIJ SL Port    PLAN: routine port care    Full report to follow.      Claretta Fraise, MD     June 22, 2018 12:32 PM

## 2018-06-22 NOTE — Unmapped (Signed)
Pulmonology Daily Progress Note     LOS: 14 days under inpatient status    Assessment/Plan:  Principal Problem:    Chronic tracheobronchitis (CMS-HCC)  Active Problems:    Hypothyroidism    Mood disorder (CMS-HCC)    VATER syndrome  Resolved Problems:    * No resolved hospital problems. *         *Acute tracheobronchitis, recurrent.  Resolving.??  -Continue IV ceftriaxone and metronidazole through 7/17 to complete 14d course.  -Continue airway clearance w Metaneb, 3% saline nebs.  -Continue home albuterol/ipratropium, budesonide, tobramycin nebs, azithro MWF, cetirizine, montelukast.  -Continue Trilogy home vent qhs.  -Due to repeated episodes of tracheobronchitis, need for IV abx, per primary Pulm team will need Port-a-cath placed; however, VIR wishes that this be done just prior to D/C (7/17) and that PICC be placed in meantime.    *Chest discomfort - 7/13. CXR concerning for rib fracture; CT scan did not demonstrate acute fracture. Will wean off morphine for pain as this is likely MSK in etiology.  ??  *VATER syndrome. ??Achalasia, esophageal stenosis.  -Continue PPI.  ??  *Hypothyroid.  -Continue levothyroxine.  ??  *Bipolar.  Stable.  -Continue citalopram, VPA, mirtazapine, lorazepam.  ??  *Acne.  -Continue home minocycline.  ??  *DVT prophylaxis.  -Enoxaparin.  ??  *Dispo. ??To remain in-house for duration of IV abx per primary Pulm team and then to D/C home after that.  ????  Please page [phone] with any questions.    [name], MD  ??    Subjective:   No chest pain, shortness of breath.  No change in secretions but overall doing well.  Eating well     Objective:   Physical Exam:  General: Lying comfortably in bed, no acute distress.   Eyes: Anicteric sclerae, no conjunctival injection.    ENT: Mucous membranes moist.     Neck: Tracheostomy   Respiratory: Normal respiratory effort.  Clear to auscultation bilaterally without wheezes or crackles.    Cardiovascular: Regular rhythm without murmur.  No lower extremity edema.   Gastrointestinal: Nondistended.   Skin: No obvious rashes or breakdown.  Warm, dry.   Musculoskeletal: Motor strength in all extremities grossly intact.   Psychiatric: Alert.  Answers questions appropriately.  Judgment and insight intact.   Neurologic: Extraocular movements intact, no facial asymmetry.         Vital signs in last 24 hours:  Temp:  [36.5 ??C (97.7 ??F)-36.9 ??C (98.4 ??F)] 36.8 ??C (98.2 ??F)  Heart Rate:  [92-115] 111  SpO2 Pulse:  [94-115] 111  Resp:  [10-18] 11  BP: (126-172)/(69-138) 129/87  MAP (mmHg):  [93-111] 93  FiO2 (%):  [28 %] 28 %  SpO2:  [93 %-100 %] 94 %    Intake/Output last 24 hours:    Intake/Output Summary (Last 24 hours) at 06/22/2018 1412  Last data filed at 06/22/2018 1001  Gross per 24 hour   Intake 400 ml   Output 2350 ml   Net -1950 ml       Most recent recorded weights:  Last 5 Recorded Weights    06/08/18 1733 06/09/18 1932 06/11/18 0000   Weight: 94.1 kg (207 lb 7.3 oz) 96.6 kg (212 lb 15.4 oz) 98.8 kg (217 lb 13 oz)       Medications:   Scheduled Meds:  ??? albuterol  2.5 mg Nebulization Q6H (RT)    And   ??? ipratropium  500 mcg Nebulization Q6H (RT)   ??? arformoterol  15 mcg Nebulization BID (RT)   ??? azithromycin  500 mg G-tube Once per day on Mon Wed Fri   ??? budesonide  1 mg Nebulization BID   ??? cefTRIAXone  1 g Intravenous Q24H    And   ??? metronidazole  500 mg Intravenous Q8H   ??? cetirizine  10 mg G-tube QPM   ??? cholecalciferol (vitamin D3)  6,000 Units G-tube Daily   ??? citalopram  40 mg G-tube Nightly   ??? clindamycin   Topical BID   ??? esomeprazole  40 mg Oral daily   ??? fluticasone propionate  2 spray Each Nare Daily   ??? gabapentin  300 mg G-tube TID   ??? levothyroxine  75 mcg G-tube Daily   ??? lidocaine  1 patch Transdermal Daily   ??? LORazepam  0.5 mg G-tube BID   ??? melatonin  3 mg Oral QPM   ??? mirtazapine  15 mg G-tube Nightly   ??? montelukast  10 mg Oral Nightly   ??? sodium chloride  4 mL Nebulization Q6H (RT)   ??? triamcinolone  1 application Topical BID   ??? valproate  250 mg G-tube Q24H   ??? valproate  750 mg G-tube Q24H

## 2018-06-22 NOTE — Unmapped (Signed)
Pt is A & O X4. VSS. Sleepy mostly, but continue to complain rib pain that is managed with PRN PO and IV med. Pt ambulates in the room independently. Remains stable. Continue on IV antibiotic. Tolerating well. NPO today for port insertion. Remains stable, cont to monitor. See flowsheet for full assessment. ( see MAR).         Problem: Adult Inpatient Plan of Care  Goal: Plan of Care Review  Outcome: Progressing  Goal: Patient-Specific Goal (Individualization)  Outcome: Progressing  Goal: Absence of Hospital-Acquired Illness or Injury  Outcome: Progressing  Goal: Optimal Comfort and Wellbeing  Outcome: Progressing  Goal: Readiness for Transition of Care  Outcome: Progressing  Goal: Rounds/Family Conference  Outcome: Progressing     Problem: Venous Thromboembolism  Goal: VTE (Venous Thromboembolism) Symptom Resolution  Outcome: Progressing     Problem: Fall Injury Risk  Goal: Absence of Fall and Fall-Related Injury  Outcome: Progressing     Problem: Breathing Pattern Ineffective  Goal: Effective Breathing Pattern  Outcome: Progressing     Problem: Asthma Comorbidity  Goal: Maintenance of Asthma Control  Outcome: Progressing     Problem: Communication Impairment (Mechanical Ventilation, Invasive)  Goal: Effective Communication  Outcome: Progressing     Problem: Ventilator-Induced Lung Injury (Mechanical Ventilation, Invasive)  Goal: Absence of Ventilator-Induced Lung Injury  Outcome: Progressing     Problem: Infection  Goal: Infection Symptom Resolution  Outcome: Progressing     Problem: Nausea and Vomiting  Goal: Fluid and Electrolyte Balance  Outcome: Progressing

## 2018-06-22 NOTE — Unmapped (Signed)
Pt neurologically and hemodynamically stable. Tolerating room air with speaking valve NPO this AM for port placement,tolerated procedure well. Regular diet,voids independently. Skin intact.pain treated effectively with morphine. Will continue to monitor  Problem: Adult Inpatient Plan of Care  Goal: Plan of Care Review  Outcome: Progressing  Goal: Patient-Specific Goal (Individualization)  Outcome: Progressing  Goal: Absence of Hospital-Acquired Illness or Injury  Outcome: Progressing  Goal: Optimal Comfort and Wellbeing  Outcome: Progressing  Goal: Readiness for Transition of Care  Outcome: Progressing  Goal: Rounds/Family Conference  Outcome: Progressing     Problem: Venous Thromboembolism  Goal: VTE (Venous Thromboembolism) Symptom Resolution  Outcome: Progressing     Problem: Fall Injury Risk  Goal: Absence of Fall and Fall-Related Injury  Outcome: Progressing     Problem: Breathing Pattern Ineffective  Goal: Effective Breathing Pattern  Outcome: Progressing     Problem: Asthma Comorbidity  Goal: Maintenance of Asthma Control  Outcome: Progressing     Problem: Communication Impairment (Mechanical Ventilation, Invasive)  Goal: Effective Communication  Outcome: Progressing     Problem: Ventilator-Induced Lung Injury (Mechanical Ventilation, Invasive)  Goal: Absence of Ventilator-Induced Lung Injury  Outcome: Progressing     Problem: Infection  Goal: Infection Symptom Resolution  Outcome: Progressing     Problem: Nausea and Vomiting  Goal: Fluid and Electrolyte Balance  Outcome: Progressing

## 2018-06-22 NOTE — Unmapped (Signed)
HPI: [name] is a 27 y.o. male who will undergo port placement for long term antibiotics.    Allergies:   Allergies   Allergen Reactions   ??? Xopenex [Levalbuterol Hcl] Other (See Comments)     11/25/17: Bronchospasm with levalbuterol; the patient reports that he is able to tolerate albuterol without issues   ??? Allopurinol Analogues    ??? Versed [Midazolam]        Medications:   Current Facility-Administered Medications   Medication Dose Route Frequency Provider Last Rate Last Dose   ??? acetaminophen (TYLENOL) solution 650 mg  650 mg G-tube Q4H PRN Anell Barr, MD   650 mg at 06/19/18 1714   ??? albuterol 2.5 mg /3 mL (0.083 %) nebulizer solution 2.5 mg  2.5 mg Nebulization Q6H (RT) Anell Barr, MD   2.5 mg at 06/22/18 0950    And   ??? ipratropium (ATROVENT) 0.02 % nebulizer solution 500 mcg  500 mcg Nebulization Q6H (RT) Anell Barr, MD   500 mcg at 06/22/18 0950   ??? arformoterol (BROVANA) nebulizer solution 15 mcg/2 mL  15 mcg Nebulization BID (RT) Peter Garter, MD   15 mcg at 06/22/18 1047   ??? azithromycin (ZITHROMAX) tablet 500 mg  500 mg G-tube Once per day on Mon Wed Fri Anell Barr, MD   500 mg at 06/21/18 1610   ??? budesonide (PULMICORT) nebulizer solution 1 mg  1 mg Nebulization BID Anell Barr, MD   1 mg at 06/22/18 1000   ??? cefTRIAXone (ROCEPHIN) 1 g in sodium chloride 0.9 % (NS) 100 mL IVPB-connector bag  1 g Intravenous Q24H Princella Ion, MD 200 mL/hr at 06/21/18 1227 1 g at 06/21/18 1227    And   ??? metroNIDAZOLE (FLAGYL) IVPB 500 mg  500 mg Intravenous Q8H Princella Ion, MD 100 mL/hr at 06/22/18 1001 500 mg at 06/22/18 1001   ??? cetirizine (ZyrTEC) tablet 10 mg  10 mg G-tube QPM Anell Barr, MD   10 mg at 06/21/18 1836   ??? cholecalciferol (vitamin D3) tablet 6,000 Units  6,000 Units G-tube Daily Anell Barr, MD   6,000 Units at 06/22/18 [phone]   ??? citalopram (CeleXA) tablet 40 mg  40 mg G-tube Nightly Anell Barr, MD   40 mg at 06/21/18 2102   ??? clindamycin (CLEOCIN T) 1 % external solution   Topical BID Anell Barr, MD       ??? cyclobenzaprine (FLEXERIL) tablet 5 mg  5 mg Oral TID PRN Peter Garter, MD   5 mg at 06/21/18 5409   ??? esomeprazole (NEXIUM) granules 40 mg  40 mg Oral daily Peter Garter, MD   Stopped at 06/22/18 [phone]   ??? fluticasone propionate (FLONASE) 50 mcg/actuation nasal spray 2 spray  2 spray Each Nare Daily Anell Barr, MD   2 spray at 06/22/18 0947   ??? gabapentin (NEURONTIN) oral solution  300 mg G-tube TID Anell Barr, MD   300 mg at 06/22/18 0957   ??? levothyroxine (SYNTHROID, LEVOTHROID) tablet 75 mcg  75 mcg G-tube Daily Anell Barr, MD   75 mcg at 06/22/18 0949   ??? lidocaine (LIDODERM) 5 % patch 1 patch  1 patch Transdermal Daily Anell Barr, MD   1 patch at 06/21/18 1048   ??? LORazepam (ATIVAN) tablet 0.5 mg  0.5 mg G-tube BID Anell Barr, MD   0.5 mg at 06/22/18 0949   ???  melatonin tablet 3 mg  3 mg Oral QPM Anell Barr, MD   3 mg at 06/21/18 2102   ??? mirtazapine (REMERON) tablet 15 mg  15 mg G-tube Nightly Anell Barr, MD   15 mg at 06/21/18 2102   ??? montelukast (SINGULAIR) tablet 10 mg  10 mg Oral Nightly Anell Barr, MD   10 mg at 06/21/18 2102   ??? MORPhine 4 mg/mL injection 4 mg  4 mg Intravenous Q4H PRN Maryclare Labrador, MD   4 mg at 06/22/18 1007   ??? ondansetron (ZOFRAN-ODT) disintegrating tablet 4 mg  4 mg Oral Q8H PRN Anell Barr, MD       ??? oxyCODONE (ROXICODONE) immediate release tablet 5 mg  5 mg G-tube Q4H PRN Anell Barr, MD   5 mg at 06/21/18 0720   ??? promethazine (PHENERGAN) injection 25 mg  25 mg Intravenous Q6H PRN Anell Barr, MD   25 mg at 06/22/18 1008   ??? SMOG ENEMA  240 mL Rectal Daily PRN Peter Garter, MD   240 mL at 06/22/18 0030   ??? sodium chloride 3 % nebulizer solution 4 mL  4 mL Nebulization Q6H (RT) Anell Barr, MD   4 mL at 06/22/18 0950   ??? triamcinolone (KENALOG) 0.1 % ointment 1 application  1 application Topical BID Anell Barr, MD   1 application at 06/22/18 [phone]   ??? valproate (DEPAKENE) oral syrup  250 mg G-tube Q24H Anell Barr, MD   250 mg at 06/22/18 9604   ??? valproate (DEPAKENE) oral syrup  750 mg G-tube Q24H Anell Barr, MD   750 mg at 06/21/18 2104       PMH:   Past Medical History:   Diagnosis Date   ??? ADHD    ??? Asthma    ??? Bipolar 1 disorder (CMS-HCC)    ??? Chronic tracheobronchitis (CMS-HCC)    ??? Constipation    ??? De Quervain's tenosynovitis    ??? Dysphagia    ??? Esophageal dysmotility    ??? Esophageal stricture    ??? GERD (gastroesophageal reflux disease)    ??? hypothyroid    ??? Seizures (CMS-HCC)    ??? Tracheobronchomalacia    ??? VATER syndrome    ??? Ventilator dependence (CMS-HCC)        ASA Grade: ASA 3 - Patient with moderate systemic disease with functional limitations    ROS:  General: Denies fever or chills.  Cardiovascular: Denies chest pain.   Pulmonary: Denies shortness of breath, snoring, sleep apnea, or respiratory infection.    --This procedure has been fully reviewed with the patient/patient???s authorized representative. The risks, benefits and alternatives have been explained, and the patient/patient???s authorized representative has consented to the procedure.  --The patient will accept blood products in an emergent situation.  --The patient does not have a Do Not Resuscitate order in effect.          PE:    Vitals:    06/22/18 1013   BP: 135/69   Pulse:    Resp:    Temp:    SpO2: 100%     General: WD, WN male in NAD.  Airway assessment: Trached  Cardiovascular:  Regular rate and rhythm.   Lungs: Respirations nonlabored      Assessment/Plan:    [name] is a 27 y.o. male who will undergo Port placement in Interventional Radiology.

## 2018-06-23 MED ORDER — PROMETHAZINE 6.25 MG/5 ML ORAL SYRUP
Freq: Four times a day (QID) | ORAL | 0 refills | 0.00000 days | Status: SS | PRN
Start: 2018-06-23 — End: 2018-08-02

## 2018-06-23 MED ORDER — SODIUM CHLORIDE 0.9 % INJECTION SOLUTION
Freq: Four times a day (QID) | INTRAVENOUS | 0 refills | 0 days | Status: CP | PRN
Start: 2018-06-23 — End: ?

## 2018-06-23 MED ORDER — CYCLOBENZAPRINE 5 MG TABLET
ORAL_TABLET | Freq: Three times a day (TID) | ORAL | 0 refills | 0.00000 days | Status: CP | PRN
Start: 2018-06-23 — End: 2018-07-23

## 2018-06-23 MED ORDER — CODEINE 10 MG-GUAIFENESIN 100 MG/5 ML ORAL LIQUID
Freq: Three times a day (TID) | ORAL | 0 refills | 0.00000 days | Status: CP | PRN
Start: 2018-06-23 — End: 2018-06-30

## 2018-06-23 MED ORDER — LIDOCAINE-PRILOCAINE 2.5 %-2.5 % TOPICAL CREAM
Freq: Every day | TOPICAL | 0 refills | 0.00000 days | Status: CP | PRN
Start: 2018-06-23 — End: 2019-06-23

## 2018-06-23 MED ORDER — LIDOCAINE-PRILOCAINE 2.5 %-2.5 % TOPICAL CREAM: g | 0 refills | 0 days

## 2018-06-23 MED ORDER — SODIUM CHLORIDE 0.9 % (FLUSH) INJECTION SYRINGE
0 refills | 0 days
Start: 2018-06-23 — End: 2019-06-23

## 2018-06-23 NOTE — Unmapped (Signed)
Problem: Adult Inpatient Plan of Care  Goal: Plan of Care Review  Outcome: Progressing     Problem: Fall Injury Risk  Goal: Absence of Fall and Fall-Related Injury  Outcome: Progressing  Intervention: Promote Merchandiser, retail (Taken 06/23/2018 0600)  Safety Interventions: aspiration precautions;low bed  Note:   Slept well through the night. No falls. Ambulates in the hallway.     Problem: Breathing Pattern Ineffective  Goal: Effective Breathing Pattern  Outcome: Progressing  Intervention: Promote Improved Breathing Pattern  Flowsheets  Taken 06/23/2018 0200  Head of Bed (HOB): HOB at 30-45 degrees  Taken 06/22/2018 2000  Airway/Ventilation Management: airway patency maintained  Breathing Techniques/Airway Clearance: deep/controlled cough encouraged  Note:   On a vent and tele at night. Breathing fine denies sob.

## 2018-06-23 NOTE — Unmapped (Signed)
Physician Discharge Summary    Admit date: 06/08/2018    Discharge date and time: 06/23/18 1200    Discharge to: Home with Home Health    Discharge Service: Med Undesignated (MDX)    Discharge Attending Physician: Princella Ion, MD    Discharge Diagnoses: acute exacerbation of chronic tracheobronchitis    Procedures: port placement    Pertinent Test Results: none    Hospital Course:  Cameron Baker??is a 27 y.o.??male??with history of VATER syndrome??(s/p trach/peg)??w/ frequent admissions for??chronic??tracheobronchitis (most recently from 6/15-6/17), hypothyroidism, asthma,??bipolar 1 disorder, and acne presenting with repeat tracheobronchitis.     Tracheobronchitis: Cameron Baker has a history of VATER syndrome-associated tracheobronchomalacia with tracheostomy placement and nocturnal mechanical ventilation. He has had multiple hospitalizations for chronic tracheobronchitis, most recently from 6/15-6/17. LR culture from 6/17 grew 2+ gram positive bacilli/1+ gram positive cocci, and he was treated with IV cefepime/vancomycin, and then was transitioned to oral levofloxacin which was completed 6/24. However, he developed increasing respiratory symptoms of cough, congestion, increased secretions at home, consistent with another episode of tracheobronchitis, possibly a recurrence due to insufficient abx duration or coverage.??On admission (7/2), flu/RPP negative, LR Cx grew only OP flora, and blood cultures were negative after 4 days. Admission CXR unremarkable. Initial abx coverage of cefepime, vancomycin, and metronidazole was d/c'ed (7/2-7/4) when admission LRCx showed only oropharyngeal flora and he was started on Unasyn IV on 7/4 to complete a 14 day course, planned to end on 7/18.  He also underwent airway clearance by respiratory therapy with metaneb and 3% saline nebs. He was continued on home albuterol/Atrovent, azithromycin??500 mg MWF, budesonide??nebs, Zyrtec??10 mg, Singulair, and inhaled TOBI. He required IV Phenergan 12.5 mg q6h PRN for nausea with med administration. He will be transferred to [facility] to complete course of IV Unasyn and to undergo port placement there before discharge.   ??  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  Bipolar 1 Disorder/PTSD: Continue home Celexa, Valproate, Remeron   ??  Hypothyroidism: Continue home Synthroid   ??  Acne: Continue home minocycline        Condition at Discharge: good  Discharge Medications:      Your Medication List      CHANGE how you take these medications    diclofenac sodium 1 % gel  Commonly known as:  VOLTAREN  Apply 2 g topically Four (4) times a day.  What changed:    ?? when to take this  ?? reasons to take this        CONTINUE taking these medications    acetaminophen 160 mg/5 mL (5 mL) suspension  Commonly known as:  TYLENOL  15.6 mL (500 mg total) by G-tube route every four (4) hours as needed for pain.     albuterol 90 mcg/actuation inhaler  Commonly known as:  PROAIR HFA  Inhale 2 puffs every six (6) hours as needed for wheezing.     arformoterol  Commonly known as:  BROVANA  Inhale 2 mL (15 mcg total) by nebulization Two (2) times a day.     azithromycin 500 MG tablet  Commonly known as:  ZITHROMAX  1 tablet (500 mg total) by G-tube route 3 (three) times a week.     budesonide 0.5 mg/2 mL nebulizer solution  Commonly known as:  PULMICORT  Inhale 4 mL (1 mg total) by nebulization Two (2) times a day.     budesonide-formoterol 160-4.5 mcg/actuation inhaler  Commonly known as:  SYMBICORT  Inhale 2 puffs Two (2) times a day.     cetirizine 10 MG tablet  Commonly known as:  ZyrTEC  1 tablet (10 mg total) by G-tube route every evening.     cholecalciferol (vitamin D3) 2,000 unit Cap  Commonly known as:  VITAMIN D3  3 capsules (6,000 Units total) by G-tube route daily.     citalopram 40 MG tablet  Commonly known as:  CeleXA  1 tablet (40 mg total) by G-tube route nightly.     clindamycin 1 % gel Commonly known as:  CLINDAGEL  Apply topically Two (2) times a day.     dicyclomine 10 mg capsule  Commonly known as:  BENTYL  10 mg by G-tube route 4 (four) times a day as needed.     fluticasone propionate 50 mcg/actuation nasal spray  Commonly known as:  FLONASE  2 sprays by Each Nare route daily.     gabapentin 300 MG capsule  Commonly known as:  NEURONTIN  300 mg by G-tube route Three (3) times a day.     ibuprofen 600 MG tablet  Commonly known as:  ADVIL,MOTRIN  1 tablet (600 mg total) by G-tube route every six (6) hours as needed for pain.     ipratropium-albuterol 0.5-2.5 mg/3 mL nebulizer  Commonly known as:  DUO-NEB  Inhale 3 mL 4 (four) times a day.     levothyroxine 75 MCG tablet  Commonly known as:  SYNTHROID, LEVOTHROID  1 tablet (75 mcg total) by G-tube route daily.     linaCLOtide 290 mcg capsule  Commonly known as:  LINZESS  1 capsule (290 mcg total) by G-tube route daily.     LORazepam 0.5 MG tablet  Commonly known as:  ATIVAN  Take 1 tablet (0.5mg ) twice daily and 1 tablet (0.5mg ) daily as needed for anxiety via G-tube     minocycline 100 MG capsule  Commonly known as:  MINOCIN,DYNACIN  1 capsule (100 mg total) by G-tube route nightly.     mirtazapine 15 MG tablet  Commonly known as:  REMERON  1 tablet (15 mg total) by G-tube route nightly.     montelukast 10 mg tablet  Commonly known as:  SINGULAIR  Take 1 tablet (10 mg total) by mouth nightly.     omeprazole 20 MG capsule  Commonly known as:  PriLOSEC  20 mg Two (2) times a day (30 minutes before a meal). Via G-tube     oxyCODONE 5 MG immediate release tablet  Commonly known as:  ROXICODONE  5 mg by G-tube route every four (4) hours as needed for pain.     PARI LC D NEBULIZER Misc  Generic drug:  nebulizers  Provide 1 device  for use with inhaled medication.     sodium chloride 3 % nebulizer solution  Inhale 4 mL by nebulization 4 (four) times a day.     tiZANidine 4 MG tablet  Commonly known as:  ZANAFLEX  Take 4 mg by mouth every eight (8) hours as needed.     tobramycin (PF) 300 mg/5 mL nebulizer solution  Commonly known as:  TOBI  Inhale 5 mL (300 mg total) by nebulization every twelve (12) hours. Every other month     triamcinolone 0.1 % cream  Commonly known as:  KENALOG  Apply topically Two (2) times a day.     valproate 250 mg/5 mL syrup  Commonly known as:  DEPAKENE  TAKE BY G-TUBE  EVERY MORNING AND EVERY EVENING        ASK your doctor about these medications    codeine-guaifenesin 10-100 mg/5 mL liquid  Commonly known as:  GUAIFENESIN AC  Take 5 mL by mouth Three (3) times a day as needed for cough. for up to 5 days  Ask about: Should I take this medication?     polyethylene glycol 17 gram packet  Commonly known as:  MIRALAX  17 g by G-tube route daily as needed.  Ask about: Should I take this medication?     promethazine 6.25 mg/5 mL syrup  Commonly known as:  PHENERGAN  Take 10 mL (12.5 mg total) by mouth 4 (four) times a day as needed for nausea. for up to 7 days  Ask about: Should I take this medication?            Pending Test Results:       Discharge Instructions:       Follow Up instructions and Outpatient Referrals     Discharge instructions      Resume previous PDN orders.  Please access and de-access port monthly, pt to obtain needed supplies from HME provider, to provide needed port care and maintenance.   [facility]   [phone]             Appointments which have been scheduled for you    Jun 24, 2018  1:30 PM EDT  (Arrive by 1:15 PM)  RETURN  STEP with Rubbie Battiest, MD  [facility] PSYCHIATRY STEP [facility]) [address] [place] [address]  [phone]      Jun 29, 2018 12:00 PM EDT  (Arrive by 11:45 AM)  RETURN PFT 15 with [facility] PFT 4  [facility] PULMONARY SPECIALTY FUNCT [facility] [facility]) [address]  [address]  [place] [address]  [phone]      Jun 29, 2018 12:30 PM EDT  (Arrive by 12:15 PM) RETURN BRONCHIECTSIS with Truett Mainland, MD  [facility] PULMONARY SPECIALTY CL [place] [facility]) [address]  [place] [address]  [phone]      Jul 05, 2018 10:20 AM EDT  (Arrive by 9:50 AM)  RETURN  GENERAL with Liane Comber, MD  [facility] [place] [facility]) [address]  [place] [address]  [phone]            Resources and Referrals     [facility] [phone] has been contacted to resume your previous home private duty nursing care and for port management with supplies.      Please contact [facility] at [phone] when supplies are needed for port care. They will need to order and ship to your home, please allow time for order and shipment prior to scheduling your port care.                I spent greater than 30 minutes in the discharge of this patient.

## 2018-06-23 NOTE — Unmapped (Signed)
[name] from [facility] called to ask about potential discharge for Mr. Fullman.  She is concerned about where Mr. Buelow currently resides and if he has a stable home.  I told [name] that I would speak to Mr. Stidham at his next clinic appointment because Mr. Morejon is being discharged today.

## 2018-06-24 ENCOUNTER — Encounter
Admit: 2018-06-24 | Discharge: 2018-06-25 | Payer: MEDICARE | Attending: Psychosomatic Medicine | Primary: Psychosomatic Medicine

## 2018-06-24 DIAGNOSIS — F39 Unspecified mood [affective] disorder: Secondary | ICD-10-CM

## 2018-06-24 DIAGNOSIS — F329 Major depressive disorder, single episode, unspecified: Secondary | ICD-10-CM

## 2018-06-24 DIAGNOSIS — R569 Unspecified convulsions: Secondary | ICD-10-CM

## 2018-06-24 DIAGNOSIS — F411 Generalized anxiety disorder: Principal | ICD-10-CM

## 2018-06-24 MED ORDER — VALPROIC ACID (AS SODIUM SALT) 250 MG/5 ML ORAL SOLUTION
0 refills | 0 days | Status: CP
Start: 2018-06-24 — End: ?

## 2018-06-24 MED ORDER — MIRTAZAPINE 15 MG TABLET
ORAL_TABLET | Freq: Every evening | GASTROSTOMY | 0 refills | 0 days
Start: 2018-06-24 — End: ?

## 2018-06-24 MED ORDER — CITALOPRAM 40 MG TABLET
ORAL_TABLET | Freq: Every evening | GASTROSTOMY | 0 refills | 0.00000 days | Status: CP
Start: 2018-06-24 — End: ?
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
## 2018-06-27 ENCOUNTER — Ambulatory Visit
Admit: 2018-06-27 | Discharge: 2018-06-30 | Disposition: A | Discharge status: Home Health | Payer: MEDICARE | Admitting: Pulmonary Disease

## 2018-06-27 DIAGNOSIS — R0602 Shortness of breath: Principal | ICD-10-CM

## 2018-06-27 LAB — CBC
HEMOGLOBIN: 13.5 g/dL (ref 13.5–17.5)
MEAN CORPUSCULAR HEMOGLOBIN CONC: 32.6 g/dL (ref 31.0–37.0)
MEAN CORPUSCULAR HEMOGLOBIN: 24.5 pg — ABNORMAL LOW (ref 26.0–34.0)
MEAN CORPUSCULAR VOLUME: 75.1 fL — ABNORMAL LOW (ref 80.0–100.0)
MEAN PLATELET VOLUME: 6.8 fL — ABNORMAL LOW (ref 7.0–10.0)
PLATELET COUNT: 584 10*9/L — ABNORMAL HIGH (ref 150–440)
RED BLOOD CELL COUNT: 5.5 10*12/L (ref 4.50–5.90)
RED CELL DISTRIBUTION WIDTH: 16.1 % — ABNORMAL HIGH (ref 12.0–15.0)
WBC ADJUSTED: 10.2 10*9/L (ref 4.5–11.0)

## 2018-06-27 LAB — CBC W/ AUTO DIFF
BASOPHILS ABSOLUTE COUNT: 0.1 10*9/L (ref 0.0–0.1)
BASOPHILS RELATIVE PERCENT: 0.7 %
EOSINOPHILS ABSOLUTE COUNT: 0.3 10*9/L (ref 0.0–0.4)
HEMATOCRIT: 41.2 % (ref 41.0–53.0)
HEMOGLOBIN: 13.3 g/dL — ABNORMAL LOW (ref 13.5–17.5)
LARGE UNSTAINED CELLS: 2 % (ref 0–4)
LYMPHOCYTES ABSOLUTE COUNT: 3.8 10*9/L (ref 1.5–5.0)
LYMPHOCYTES RELATIVE PERCENT: 27 %
MEAN CORPUSCULAR HEMOGLOBIN CONC: 32.3 g/dL (ref 31.0–37.0)
MEAN CORPUSCULAR HEMOGLOBIN: 24.2 pg — ABNORMAL LOW (ref 26.0–34.0)
MEAN CORPUSCULAR VOLUME: 75.1 fL — ABNORMAL LOW (ref 80.0–100.0)
MEAN PLATELET VOLUME: 6.9 fL — ABNORMAL LOW (ref 7.0–10.0)
MONOCYTES ABSOLUTE COUNT: 0.9 10*9/L — ABNORMAL HIGH (ref 0.2–0.8)
MONOCYTES RELATIVE PERCENT: 6.4 %
NEUTROPHILS ABSOLUTE COUNT: 8.8 10*9/L — ABNORMAL HIGH (ref 2.0–7.5)
NEUTROPHILS RELATIVE PERCENT: 61.7 %
RED BLOOD CELL COUNT: 5.48 10*12/L (ref 4.50–5.90)
RED CELL DISTRIBUTION WIDTH: 16.2 % — ABNORMAL HIGH (ref 12.0–15.0)
WBC ADJUSTED: 14.2 10*9/L — ABNORMAL HIGH (ref 4.5–11.0)

## 2018-06-27 LAB — LACTATE BLOOD VENOUS
Lactate:SCnc:Pt:BldV:Qn:: 2.5 — ABNORMAL HIGH
Lactate:SCnc:Pt:BldV:Qn:: 3 — ABNORMAL HIGH
Lactate:SCnc:Pt:BldV:Qn:: 3.5 — ABNORMAL HIGH

## 2018-06-27 LAB — BLOOD GAS CRITICAL CARE PANEL, VENOUS
BASE EXCESS VENOUS: -1 (ref -2.0–2.0)
CALCIUM IONIZED VENOUS (MG/DL): 4.46 mg/dL (ref 4.40–5.40)
GLUCOSE WHOLE BLOOD: 118 mg/dL
HCO3 VENOUS: 23 mmol/L (ref 22–27)
LACTATE BLOOD VENOUS: 4 mmol/L — ABNORMAL HIGH (ref 0.5–1.8)
O2 SATURATION VENOUS: 75.2 % (ref 40.0–85.0)
PCO2 VENOUS: 37 mmHg — ABNORMAL LOW (ref 40–60)
PH VENOUS: 7.41 (ref 7.32–7.43)
PO2 VENOUS: 44 mmHg (ref 30–55)
SODIUM WHOLE BLOOD: 142 mmol/L (ref 135–145)

## 2018-06-27 LAB — BASIC METABOLIC PANEL
ANION GAP: 20 mmol/L — ABNORMAL HIGH (ref 9–15)
ANION GAP: 11 mmol/L (ref 9–15)
ANION GAP: 14 mmol/L (ref 9–15)
BLOOD UREA NITROGEN: 8 mg/dL (ref 7–21)
BLOOD UREA NITROGEN: 10 mg/dL (ref 7–21)
BUN / CREAT RATIO: 9
BUN / CREAT RATIO: 13
CALCIUM: 9.6 mg/dL (ref 8.5–10.2)
CALCIUM: 9.3 mg/dL (ref 8.5–10.2)
CALCIUM: 9.7 mg/dL (ref 8.5–10.2)
CHLORIDE: 100 mmol/L (ref 98–107)
CHLORIDE: 100 mmol/L (ref 98–107)
CO2: 20 mmol/L — ABNORMAL LOW (ref 22.0–30.0)
CO2: 25 mmol/L (ref 22.0–30.0)
CO2: 24 mmol/L (ref 22.0–30.0)
EGFR CKD-EPI AA MALE: >90 mL/min/{1.73_m2} (ref >=60)
EGFR CKD-EPI AA MALE: >90 mL/min/{1.73_m2} (ref >=60)
EGFR CKD-EPI AA MALE: >90 mL/min/{1.73_m2} (ref >=60)
EGFR CKD-EPI NON-AA MALE: >90 mL/min/{1.73_m2} (ref >=60)
EGFR CKD-EPI NON-AA MALE: >90 mL/min/{1.73_m2} (ref >=60)
EGFR CKD-EPI NON-AA MALE: >90 mL/min/{1.73_m2} (ref >=60)
GLUCOSE RANDOM: 115 mg/dL (ref 65–179)
GLUCOSE RANDOM: 116 mg/dL — ABNORMAL HIGH (ref 65–99)
GLUCOSE RANDOM: 145 mg/dL (ref 65–179)
POTASSIUM: 4 mmol/L (ref 3.5–5.0)
POTASSIUM: 4 mmol/L (ref 3.5–5.0)
POTASSIUM: 4.6 mmol/L (ref 3.5–5.0)
SODIUM: 140 mmol/L (ref 135–145)
SODIUM: 136 mmol/L (ref 135–145)
SODIUM: 138 mmol/L (ref 135–145)

## 2018-06-27 LAB — CALCIUM: Calcium:MCnc:Pt:Ser/Plas:Qn:: 9.6

## 2018-06-27 LAB — WBC ADJUSTED: Lab: 14.2 — ABNORMAL HIGH

## 2018-06-27 LAB — HEMOGLOBIN: Lab: 13.5

## 2018-06-27 LAB — CO2: Carbon dioxide:SCnc:Pt:Ser/Plas:Qn:: 24

## 2018-06-27 LAB — PH VENOUS: pH:LsCnc:Pt:BldV:Qn:: 7.41

## 2018-06-27 LAB — EGFR CKD-EPI AA MALE: Lab: >90

## 2018-06-27 LAB — CREATINE KINASE TOTAL: Creatine kinase:CCnc:Pt:Ser/Plas:Qn:: 134

## 2018-06-27 NOTE — Unmapped (Addendum)
Patient rounds completed. The following patient needs were addressed:  Pain, Personal Belongings, Plan of Care, Call Bell in Reach and Bed Position Low . Pt in NAD, Will continue to monitor. Secondary assessment  Unchanged

## 2018-06-27 NOTE — Unmapped (Signed)
Pt c/o nausea. Given prn Phenergan.

## 2018-06-27 NOTE — Unmapped (Signed)
[facility] Emergency Department Provider Note      ED Course, Assessment and Plan     Initial Clinical Impression:    June 27, 2018 3:10 AM   Cameron Baker is a 27 y.o. y/o male presenting with shortness of breath as described below. On exam, patient is in moderate to severe respiratory distress with coughing speaking only 1-2 word sentences.. Vitals signs reveal tachypnea.    Differential diagnosis includes pneumonia, pneumothorax, infection.  Patient trach appears in place.  I suctioned the trach personally with respiratory therapist and it showed no blockage.    Will obtain labs and x-ray. Will treat patient with DuoNeb, ketamine for relaxation and bronchodilation as well as supplemental oxygen and IV fluids.  Anticipate admission..  Will give cefepime.    ED Course:  0359 I have paged pulmonology.  Patient will be admitted     _____________________________________________________________________    The case was discussed with attending physician who is in agreement with the above assessment and plan    Dictation software was used while making this note. Please excuse any errors made with dictation software.    Additional Medical Decision Making     I have reviewed the vital signs and the nursing notes. Labs and radiology results that were available during my care of the patient were independently reviewed by me and considered in my medical decision making.     I independently visualized the EKG tracing if performed  I independently visualized the radiology images if performed  I reviewed the patient's prior medical records if available.  Additional history obtained from family if available    History     CHIEF COMPLAINT:   Chief Complaint   Patient presents with   ??? Difficulty Breathing       HPI: Cameron Baker is a 27 y.o. male with a history of multiple respiratory infections and respiratory issues with trach as described below who presents the emergency department with respiratory distress via EMS.  Patient states that he was recently admitted and discharged for similar symptoms.  He states he was feeling well.  Yesterday began to have extreme shortness of breath and cough.  Called 911 for further assistance.    PAST MEDICAL HISTORY/PAST SURGICAL HISTORY:   Past Medical History:   Diagnosis Date   ??? ADHD    ??? Asthma    ??? Bipolar 1 disorder (CMS-HCC)    ??? Chronic tracheobronchitis (CMS-HCC)    ??? Constipation    ??? De Quervain's tenosynovitis    ??? Dysphagia    ??? Esophageal dysmotility    ??? Esophageal stricture    ??? GERD (gastroesophageal reflux disease)    ??? hypothyroid    ??? Seizures (CMS-HCC)    ??? Tracheobronchomalacia    ??? VATER syndrome    ??? Ventilator dependence (CMS-HCC)        Past Surgical History:   Procedure Laterality Date   ??? ANAL DILATION  01/21/2006   ??? ESOPHAGOGASTRODUODENOSCOPY      multiple   ??? HIP SURGERY Right 01/19/2017    shaved   ??? LATERAL RECTUS RECESSION  11/15/2003   ??? NISSEN FUNDOPLICATION  2001    x 2 redos   ??? PORTACATH PLACEMENT  01/21/2006    taken out same year   ??? PR ESOPHAGEAL MOTILITY STUDY, MANOMETRY N/A 04/23/2018    Procedure: ESOPHAGEAL MOTILITY STUDY W/INT & REP;  Surgeon: Nurse-Based Giproc;  [facility];  Service: Gastroenterology   ???  PR UP GI ENDOSCOPY,BALL DIL,30MM N/A 02/22/2018    Procedure: UGI ENDO; W/BALLOON DILAT ESOPHAGUS (<30MM DIAM);  Surgeon: Zetta Bills, MD;  [facility]: [facility];  Service: Gastroenterology   ??? PR UP GI ENDOSCOPY,BALL DIL,30MM N/A 05/20/2018    Procedure: UGI ENDO; W/BALLOON DILAT ESOPHAGUS (<30MM DIAM);  Surgeon: Rona Ravens, MD;  Location: [facility];  Service: Gastroenterology   ??? REMOVAL VENOUS ACCESS PORT [facility] HISTORICAL RESULT)  11/03/2006   ??? STRABISMUS SURGERY Right    ??? TRACHEOSTOMY  1994   ??? WISDOM TOOTH EXTRACTION  04/2012   ??? WRIST SURGERY      3 times.        MEDICATIONS:     Current Facility-Administered Medications:   ???  cefepime (MAXIPIME) 2 g in dextrose 100 mL IVPB (premix), 2 g, Intravenous, Once, Carole Binning, MD    Current Outpatient Medications:   ???  acetaminophen (TYLENOL) 160 mg/5 mL (5 mL) suspension, 15.6 mL (500 mg total) by G-tube route every four (4) hours as needed for pain., Disp: 1 Bottle, Rfl: 0  ???  albuterol HFA 90 mcg/actuation inhaler, Inhale 2 puffs every six (6) hours as needed for wheezing., Disp: 1 Inhaler, Rfl: 11  ???  arformoterol (BROVANA), Inhale 2 mL (15 mcg total) by nebulization Two (2) times a day., Disp: 120 mL, Rfl: 11  ???  azithromycin (ZITHROMAX) 500 MG tablet, 1 tablet (500 mg total) by G-tube route 3 (three) times a week., Disp: 12 tablet, Rfl: 11  ???  budesonide (PULMICORT) 0.5 mg/2 mL nebulizer solution, Inhale 4 mL (1 mg total) by nebulization Two (2) times a day., Disp: 240 mL, Rfl: 11  ???  budesonide-formoterol (SYMBICORT) 160-4.5 mcg/actuation inhaler, Inhale 2 puffs Two (2) times a day., Disp: 1 Inhaler, Rfl: 11  ???  cetirizine (ZYRTEC) 10 MG tablet, 1 tablet (10 mg total) by G-tube route every evening., Disp: , Rfl: 0  ???  cholecalciferol, vitamin D3, (VITAMIN D3) 2,000 unit cap, 3 capsules (6,000 Units total) by G-tube route daily., Disp: , Rfl: 0  ???  citalopram (CELEXA) 40 MG tablet, 1 tablet (40 mg total) by G-tube route nightly., Disp: 90 tablet, Rfl: 0  ???  clindamycin (CLINDAGEL) 1 % gel, Apply topically Two (2) times a day., Disp: 60 g, Rfl: 6  ???  codeine-guaifenesin (GUAIFENESIN AC) 10-100 mg/5 mL liquid, Take 5 mL by mouth Three (3) times a day as needed for cough. for up to 5 days, Disp: 118 mL, Rfl: 0  ???  cyclobenzaprine (FLEXERIL) 5 MG tablet, Take 1 tablet (5 mg total) by mouth Three (3) times a day as needed for muscle spasms., Disp: 15 tablet, Rfl: 0  ???  diclofenac sodium (VOLTAREN) 1 % gel, Apply 2 g topically Four (4) times a day. (Patient taking differently: Apply 2 g topically 4 (four) times a day as needed. ), Disp: 100 g, Rfl: 6  ???  dicyclomine (BENTYL) 10 mg capsule, 10 mg by G-tube route 4 (four) times a day as needed. , Disp: , Rfl:   ???  fluticasone (FLONASE) 50 mcg/actuation nasal spray, 2 sprays by Each Nare route daily. , Disp: , Rfl:   ???  gabapentin (NEURONTIN) 300 MG capsule, 300 mg by G-tube route Three (3) times a day., Disp: , Rfl:   ???  ibuprofen (ADVIL,MOTRIN) 600 MG tablet, 1 tablet (600 mg total) by G-tube route every six (6) hours as needed for pain., Disp: 30 tablet, Rfl: 1  ???  ipratropium-albuterol (DUO-NEB) 0.5-2.5 mg/3 mL nebulizer, Inhale 3 mL 4 (four) times a day. , Disp: , Rfl:   ???  levothyroxine (SYNTHROID, LEVOTHROID) 75 MCG tablet, 1 tablet (75 mcg total) by G-tube route daily., Disp: 90 tablet, Rfl: 3  ???  lidocaine-prilocaine (EMLA) cream, Apply topically daily as needed. For port access, Disp: 30 g, Rfl: 0  ???  linaclotide (LINZESS) 290 mcg capsule, 1 capsule (290 mcg total) by G-tube route daily., Disp: 30 capsule, Rfl: 0  ???  LORazepam (ATIVAN) 0.5 MG tablet, Take 1 tablet (0.5mg ) twice daily and 1 tablet (0.5mg ) daily as needed for anxiety via G-tube, Disp: 70 tablet, Rfl: 2  ???  minocycline (MINOCIN,DYNACIN) 100 MG capsule, 1 capsule (100 mg total) by G-tube route nightly., Disp: 90 capsule, Rfl: 3  ???  mirtazapine (REMERON) 15 MG tablet, 1 tablet (15 mg total) by G-tube route nightly., Disp: 90 tablet, Rfl: 0  ???  montelukast (SINGULAIR) 10 mg tablet, Take 1 tablet (10 mg total) by mouth nightly., Disp: 30 tablet, Rfl: 11  ???  omeprazole (PRILOSEC) 20 MG capsule, 20 mg Two (2) times a day (30 minutes before a meal). Via G-tube, Disp: , Rfl:   ???  oxyCODONE (ROXICODONE) 5 MG immediate release tablet, 5 mg by G-tube route every four (4) hours as needed for pain. , Disp: , Rfl:   ???  PARI LC D NEBULIZER Misc, Provide 1 device  for use with inhaled medication., Disp: 2 each, Rfl: 11  ???  promethazine (PHENERGAN) 6.25 mg/5 mL syrup, Take 10 mL (12.5 mg total) by mouth 4 (four) times a day as needed for nausea. for up to 7 days, Disp: 120 mL, Rfl: 0  ???  sodium chloride (NS) 0.9 % Soln, Infuse 10 mL into a venous catheter 4 (four) times a day as needed (port flush)., Disp: 60 vial, Rfl: 0  ???  sodium chloride 3 % nebulizer solution, Inhale 4 mL by nebulization 4 (four) times a day., Disp: 480 mL, Rfl: 1  ???  tobramycin, PF, (TOBI) 300 mg/5 mL nebulizer solution, Inhale 5 mL (300 mg total) by nebulization every twelve (12) hours. Every other month, Disp: 280 mL, Rfl: 5  ???  triamcinolone (KENALOG) 0.1 % cream, Apply topically Two (2) times a day., Disp: 15 g, Rfl: 11  ???  valproate (DEPAKENE) 250 mg/5 mL syrup, Take 5ml (250mg ) by G-tube every morning and 15ml (750mg ) every evening, Disp: 1850 mL, Rfl: 0    ALLERGIES:   Xopenex [levalbuterol hcl]; Allopurinol analogues; and Versed [midazolam]    SOCIAL HISTORY:   Social History     Tobacco Use   ??? Smoking status: Never Smoker   ??? Smokeless tobacco: Never Used   Substance Use Topics   ??? Alcohol use: Yes     Alcohol/week: 2.0 standard drinks     Types: 2 Cans of beer per week     Comment: on weekends sometimes       FAMILY HISTORY:  Family History   Adopted: Yes          Review of Systems    A 10 point review of systems was performed and is negative other than positive elements noted in HPI       Physical Exam     VITAL SIGNS:    BP 137/123  - Pulse 105  - Resp (!) 38  - SpO2 100%     Constitutional:  Diaphoretic.  Moderate respiratory distress.  Eyes: Conjunctivae are normal.  ENT       Head: Normocephalic and atraumatic.       Nose: No congestion.       Mouth/Throat: Mucous membranes are moist.       Neck: No stridor.  Hematological/Lymphatic/Immunilogical: No cervical lymphadenopathy.  Cardiovascular: S1, S2,  Normal and symmetric distal pulses are present in all extremities.Warm and well perfused.  Respiratory: N moderate respiratory distress.  Coarse breath sounds throughout.  Gastrointestinal: Soft and nontender. There is no CVA tenderness.  Musculoskeletal: Normal range of motion in all extremities.       Right lower leg: No tenderness or edema.       Left lower leg: No tenderness or edema.  Neurologic: Normal speech and language. No gross focal neurologic deficits are appreciated.  Skin: Skin is warm, dry and intact. No rash noted.  Psychiatric: Mood and affect are normal. Speech and behavior are normal.    Radiology     XR Chest 1 view Portable   Preliminary Result      -- Patchy bibasilar pulmonary opacities, worrisome for infection/aspiration. Mild bibasilar atelectasis.      -- Mild pulmonary edema.        Xr Chest 1 View Portable    Result Date: 06/27/2018  EXAM: XR CHEST PORTABLE DATE: 06/27/2018 3:16 AM ACCESSION: [id number] UN DICTATED: 06/27/2018 3:18 AM INTERPRETATION LOCATION: [facility] CLINICAL INDICATION: 27 years old Male with SHORTNESS OF BREATH  COMPARISON: 06/19/2018 TECHNIQUE: Portable Chest Radiograph. FINDINGS: Right-sided Port-A-Cath in place with catheter tip terminating over the cavoatrial junction. Tracheostomy cannula in place over the proximal tracheal air column with tip in between the clavicular heads Lungs are mildly hypoinflated. Patchy bibasilar pulmonary opacities, which may represent infection/aspiration. Mild pulmonary edema. No pleural effusion or pneumothorax. Stable cardiomegaly.     -- Patchy bibasilar pulmonary opacities, worrisome for infection/aspiration. Mild bibasilar atelectasis. -- Mild pulmonary edema.          Pertinent labs & imaging results that were available during my care of the patient were reviewed by me and considered in my medical decision making (see chart for details).    Please note- This chart has been created using [employer]. Chart creation errors have been sought, but may not always be located and such creation errors, especially pronoun confusion, do NOT reflect on the standard of medical care.     Carole Binning, MD  Resident  06/27/18 [phone]

## 2018-06-27 NOTE — Unmapped (Signed)
Report given to [facility], Charity fundraiser. Patient care transferred at this time.
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
## 2018-06-27 NOTE — Unmapped (Signed)
Patient rounds completed. The following patient needs were addressed:  Pain, Toileting, Personal Belongings, Plan of Care, Call Bell in Reach and Bed Position Low .

## 2018-06-27 NOTE — Unmapped (Signed)
Patient rounds completed. The following patient needs were addressed:  Plan of Care, Call Bell in Reach and Bed Position Low . Admit  MD at bedside.

## 2018-06-27 NOTE — Unmapped (Signed)
Bed: 01-B  Expected date:   Expected time:   Means of arrival:   Comments:  EMS

## 2018-06-27 NOTE — Unmapped (Signed)
Report given to Stella, Charity fundraiser.

## 2018-06-27 NOTE — Unmapped (Signed)
Pt bibOCEMS r/t difficulty breathing, pt has a trach. Received 5mg  Albuterol en route and 99% on 8L o2

## 2018-06-27 NOTE — Unmapped (Signed)
Pt is resting on stretcher. Bed is locked and in its lowest position with side rails up and engaged. Pt is in no acute distress at this time. Vital signs stable at last check. Pt has call bell within reach as well as personal belongings within reach and accessible. Pt needs addressed including toileting needs and adequate pain management. Pt has been updated on the plan of care and instructed pt to use call bell to alert staff of any additional needs should they arise. Will continue to monitor.

## 2018-06-27 NOTE — Unmapped (Signed)
.  Patient rounding complete, call bell in reach, bed locked and in lowest position, patient belongings at bedside and within reach of patient.  Patient updated on plan of care.      Nurse Cassandra RN at the bedside with patient.

## 2018-06-27 NOTE — Unmapped (Signed)
Pt placed on 28% ATC.  Pt w/ improved wob and improved cough.

## 2018-06-27 NOTE — Unmapped (Signed)
Upon arrival pt received 5mg  Albuterol via trach mask (Okay per MD at bedside/Emergency override pull for increased wob/sob).

## 2018-06-27 NOTE — Unmapped (Signed)
States relief from nausea.

## 2018-06-28 LAB — COMPREHENSIVE METABOLIC PANEL
ALBUMIN: 3.5 g/dL (ref 3.5–5.0)
ALKALINE PHOSPHATASE: 57 U/L (ref 38–126)
ALT (SGPT): 38 U/L (ref 19–72)
ANION GAP: 7 mmol/L — ABNORMAL LOW (ref 9–15)
BILIRUBIN TOTAL: 0.3 mg/dL (ref 0.0–1.2)
BLOOD UREA NITROGEN: 10 mg/dL (ref 7–21)
BUN / CREAT RATIO: 14
CALCIUM: 9.2 mg/dL (ref 8.5–10.2)
CHLORIDE: 103 mmol/L (ref 98–107)
CO2: 27 mmol/L (ref 22.0–30.0)
CREATININE: 0.73 mg/dL (ref 0.70–1.30)
EGFR CKD-EPI AA MALE: >90 mL/min/{1.73_m2} (ref >=60)
EGFR CKD-EPI NON-AA MALE: >90 mL/min/{1.73_m2} (ref >=60)
GLUCOSE RANDOM: 124 mg/dL (ref 65–179)
POTASSIUM: 3.8 mmol/L (ref 3.5–5.0)
PROTEIN TOTAL: 6.3 g/dL — ABNORMAL LOW (ref 6.5–8.3)
SODIUM: 137 mmol/L (ref 135–145)

## 2018-06-28 LAB — CBC
HEMATOCRIT: 35.5 % — ABNORMAL LOW (ref 41.0–53.0)
HEMOGLOBIN: 11.5 g/dL — ABNORMAL LOW (ref 13.5–17.5)
MEAN CORPUSCULAR HEMOGLOBIN CONC: 32.5 g/dL (ref 31.0–37.0)
MEAN CORPUSCULAR VOLUME: 76.1 fL — ABNORMAL LOW (ref 80.0–100.0)
MEAN PLATELET VOLUME: 7.1 fL (ref 7.0–10.0)
PLATELET COUNT: 442 10*9/L — ABNORMAL HIGH (ref 150–440)
RED BLOOD CELL COUNT: 4.66 10*12/L (ref 4.50–5.90)
RED CELL DISTRIBUTION WIDTH: 15.7 % — ABNORMAL HIGH (ref 12.0–15.0)
WBC ADJUSTED: 11.7 10*9/L — ABNORMAL HIGH (ref 4.5–11.0)

## 2018-06-28 LAB — WBC ADJUSTED: Lab: 11.7 — ABNORMAL HIGH

## 2018-06-28 LAB — LACTATE BLOOD VENOUS: Lactate:SCnc:Pt:BldV:Qn:: 2.9 — ABNORMAL HIGH

## 2018-06-28 LAB — EGFR CKD-EPI NON-AA MALE: Lab: >90

## 2018-06-28 NOTE — Unmapped (Signed)
Patient's Bivona trach is patent, stoma area cleaned, no skin  breakdown noted . Wears ATC 28% during day, rests on AVAPS settings at night.  Patient prefers to suction himself, will call if he feels intervention is needed.  Inhaled medications given, with no adverse reactions noted.  Patient refused tobramycin, stating that this is his off month for tobi. Stable throughout shift, plan to resume ATC this a.m.Marland Kitchen

## 2018-06-28 NOTE — Unmapped (Signed)
Social Work  Psychosocial Assessment    Patient Name: Cameron Baker   Medical Record Number: [id number]   Date of Birth: 1991-01-17  Sex: Male     Referral  Reason for Referral: Complex Discharge Planning    Extended Emergency Contact Information  Primary Emergency Contact: Cameron Baker [place] of [place]  Home Phone: [phone]  Mobile Phone: [phone]  Relation: Mother     Pt is a 28 yo M w/PMHx of VATER syndrome w/ recurrent tracheobronchitis, asthma, and hypogammaglobulinemia who presents with acute dyspnea likely asthma exacerbation.    Pt has had multiple admissions recently and reports his current admission is due to SOB. Pt had been living in an apartment with a roommate, but for about the past month has been living with his friend, Cameron Baker, in her home. Pt plans to move into an apartment at [employer] [facility] on Aug. 2.    Legal Next of Kin / Guardian / POA / Advance Directives    Advance Directive (Medical Treatment)  Does patient have an advance directive covering medical treatment?: Patient does not have advance directive covering medical treatment.  Reason patient does not have an advance directive covering medical treatment:: Patient does not wish to complete one at this time  Reason there is not a Health Care Decision Maker appointed:: Patient does not wish to appoint a Health Care Decision Maker at this time  Information provided on advance directive:: No  Patient requests assistance:: No    Advance Directive (Mental Health Treatment)  Does patient have an advance directive covering mental health treatment?: Patient does not have advance directive covering mental health treatment.  Reason patient does not have an advance directive covering mental health treatment:: Patient does not wish to complete one at this time.     Pt states he would like his mother, Cameron Baker- [phone], to make healthcare decisions for him if he is not able to do so himself.    Discharge Planning Discharge Planning Information: Depending on when pt is to be discharged, he made need a taxi or family may be able to pick him up. Pt has previously d/c with taxi.    Type of Residence   Physical Address:    [address] [place] [address]    Medical Information   Past Medical History:   Diagnosis Date   ??? ADHD    ??? Asthma    ??? Bipolar 1 disorder (CMS-HCC)    ??? Chronic tracheobronchitis (CMS-HCC)    ??? Constipation    ??? De Quervain's tenosynovitis    ??? Dysphagia    ??? Esophageal dysmotility    ??? Esophageal stricture    ??? GERD (gastroesophageal reflux disease)    ??? hypothyroid    ??? Seizures (CMS-HCC)    ??? Tracheobronchomalacia    ??? VATER syndrome    ??? Ventilator dependence (CMS-HCC)        Past Surgical History:   Procedure Laterality Date   ??? ANAL DILATION  01/21/2006   ??? ESOPHAGOGASTRODUODENOSCOPY      multiple   ??? HIP SURGERY Right 01/19/2017    shaved   ??? LATERAL RECTUS RECESSION  11/15/2003   ??? NISSEN FUNDOPLICATION  2001    x 2 redos   ??? PORTACATH PLACEMENT  01/21/2006    taken out same year   ??? PR ESOPHAGEAL MOTILITY STUDY, MANOMETRY N/A 04/23/2018    Procedure: ESOPHAGEAL MOTILITY STUDY W/INT & REP;  Surgeon: Nurse-Based Giproc;  Location: GI  [facility];  Service: Gastroenterology   ??? PR UP GI ENDOSCOPY,BALL DIL,30MM N/A 02/22/2018    Procedure: UGI ENDO; W/BALLOON DILAT ESOPHAGUS (<30MM DIAM);  Surgeon: Zetta Bills, MD;  Location: GI [facility];  Service: Gastroenterology   ??? PR UP GI ENDOSCOPY,BALL DIL,30MM N/A 05/20/2018    Procedure: UGI ENDO; W/BALLOON DILAT ESOPHAGUS (<30MM DIAM);  Surgeon: Rona Ravens, MD;  Location: GI [facility];  Service: Gastroenterology   ??? REMOVAL VENOUS ACCESS PORT [facility] HISTORICAL RESULT)  11/03/2006   ??? STRABISMUS SURGERY Right    ??? TRACHEOSTOMY  1994   ??? WISDOM TOOTH EXTRACTION  04/2012   ??? WRIST SURGERY      3 times.        Family History   Adopted: Yes       Network engineer Insurance: Payor: Advertising copywriter MEDICARE ADV / Plan: UNITED HE[employer]CARE DUAL COMPLETE HMO / Product Type: *No Product type* /    Secondary Insurance: None   Prescription Coverage: Nurse, learning disability (listed above)   Preferred Pharmacy: [id number] - [place], [place] - [address] AT [facility] [address]  [id number] - [place], [place] - [address]  [facility] - [place], [place] - [address]    Barriers to taking medication: No    Transition Home   Transportation at time of discharge: Other: TBD    Anticipated changes related to Illness: none   Services in place prior to admission: N/A   Services anticipated for DC: TBD   Hemodialysis Prior to Admission: No    Readmission  Risk of Unplanned Readmission Score: UNPLANNED READMISSION SCORE: 70%  Readmitted Within the Last 30 Days? Yes  Readmission Factors include: previous discharge plan unsuccessful    Social Determinants of Health  Social Determinants of Health were addressed in provider documentation.  Please refer to patient history.    Social History  Support Systems: Parent, Friends/Neighbors, Family Members     Pt currently lives with his friend, Cameron Baker, in [place]. He plans to move into an apartment at [employer] [facility] in [place] on Aug. 2. Pt states that his current living situation is good and that it does not factor into his recurring admissions.    Medical and Psychiatric History  Psychosocial Stressors: Coping with health challenges/recent hospitalization  Outpatient Providers: Primary Care Provider   Name / Contact #: : [facility]/ [phone]  Legal: No legal issues      Ability to Access Community Services: No issues accessing community services     Per chart, pt has Bipolar Disorder and ADHD.

## 2018-06-28 NOTE — Unmapped (Signed)
 LR bolus initiated. VSS. A&O x 4. Two sets of blood cultures being drawn by [facility] RNs.

## 2018-06-28 NOTE — Unmapped (Signed)
Daily Progress Note    24hr Events: No acute events overnight.  Patient says he does not feel much improved this morning.  He is saturating well without increased oxygen requirements.  Endorses shortness of breath, denies any other complaints on review of systems    Assessment/Plan:    Cameron Baker is a 27 y.o. male who presented to [facility] with Acute respiratory distress.    Principal Problem:    Acute respiratory distress   Active Problems:    Asthma    Dysphagia    Esophageal reflux    Hypothyroidism    Class 1 obesity in adult    Seizures (CMS-HCC)    Gastrostomy status (CMS-HCC)    Stricture and stenosis of esophagus    Mood disorder (CMS-HCC)    Personality disorder (CMS-HCC)    Chronic tracheobronchitis (CMS-HCC)    Congenital tracheomalacia    Tracheostomy dependence (CMS-HCC)    VATER syndrome    Hypogammaglobulinemia (CMS-HCC)    Dyspnea    Acute asthma exacerbation  Resolved Problems:    * No resolved hospital problems. *    Cameron Baker is a 27 y.o. male well known to the service with PMHx of VATER syndrome most notable for almost intractable admissions for recurrent tracheobronchitis, asthma, and hypogammaglobulinemia who presents with acute respiratory distress  ??  ??  Acute on Chronic Respiratory Distress 2/2 acute asthma exacerbation: 1.5 days progressive chest tightness, wheezing, cough he thinks is 2/2 humidity and pollen acutely worse while outside at picnic table. RR in 30-50 range satting 98% on 8L trach collar (baseline O2) w/ EMS and on arrival in ED. Improved with 5mg  albuterol and 1L NS alone. HDS breathing comfortably at baseline home O2 req on my assessment. Initial labs notable for WBC 14, VBG pH 7.41 pCO2 37, lactate 4.0. Suspect acute asthma exacerbation c/b chronic tracheomalacia  - F/u repeat lactate (see below)  - Consider allergy/HP w/u,   -Unlikely to be worsening tracheomalacia given improvement with albuterol.  - Continue arformoterol, budesonide, azithro MWF, montelukast, flonase nasal, TOBI nebs, saline airway clearance.  -5 days of prednisone (7/21 to 7/26)  - Discuss case with primary pulmonologist Dr. Garner Nash??    Lactic Acidosis/Suspected Sepsis: Patient presented with an initial lactate of 4.0.  This was presumed to be secondary to his increased work of breathing from a asthma exacerbation without initial concern for sepsis.  His lactate improved with 1 L of fluid and albuterol to 2.5, however during the 24 hours following his admission his lactate climbed to 3.5.  At this time, code sepsis was called given persistently elevated lactate and a chest x-ray findings of patchy bibasilar pulmonary opacities possible for pneumonia.  Blood cultures were drawn the patient was given fluid resuscitation, and Zosyn was started for antibiotic coverage of HAP (given frequent hospitalizations) versus aspiration pneumonia.  Further research showed a persistently elevated lactate and this patient that may be related to his VATER syndrome and possible mitochondrial disorder.  Patient was scheduled to see a geneticist at some point, however this never happened.  Despite meeting criteria for sepsis, it is unclear if this patient is actively septic. We will wait to see the results of the blood cultures continue antibiotics in the interim.  - S/p cefepime in ED, will hold on ABX for now as he just finished a course and got better without their intervention.  -Zosyn until blood cultures are negative  -RPP negative  -Prior lower respiratory cultures show oropharyngeal flora  ??  Recurrent Admissions: Several in last few months, most recently d/c on 7/17. Currently between homes staying with friend in [place]. Says it's comfy and just until August. This admission seems very real and acute, but despite maximal therapies he continues to return. APS has shown concern for his home situation but he maintains it's safe, clean, and cool. No obvious allergens besides summer pollen and humidity.   - SW c/s for better objective assessment of home situation  ??  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  Bipolar 1 Disorder/PTSD: Continue home Celexa, Valproate, Remeron   ??  Hypothyroidism: Continue home??Synthroid   ??  Acne: Continue home minocycline??  ??  Daily Checklist:  Diet: Regular Diet  DVT PPx: Lovenox 40mg  q24h   GI PPx: Home PPI  Code Status: Full Code  Dispo: Admit to Med G  ___________________________________________________________________    Subjective:  No acute events overnight    Labs/Studies:  Labs and Studies from the last 24hrs per EMR and Reviewed         Objective:  Temp:  [36.3 ??C-36.9 ??C] 36.7 ??C  Heart Rate:  [66-120] 97  SpO2 Pulse:  [65-117] 103  Resp:  [11-22] 20  BP: (121-174)/(57-91) 145/81  FiO2 (%):  [28 %] 28 %  SpO2:  [95 %-100 %] 96 %    General appearance -weak, tired appearing.  No apparent distress  Mental status - Alert and oriented to person, place and time.   Head - NCAT, EOMI, trach  Chest -mild wheezes bilaterally.  Heart - normal rate, regular rhythm, no murmurs  Abdomen - soft, non-tender, G button in place with chronic rash around site  Neurological - no focal neurological deficits  Musculoskeletal - no joint tenderness, deformity or swelling  Extremities - peripheral pulses normal, no pedal edema, no clubbing or cyanosis  Skin - normal coloration, rash around G button

## 2018-06-28 NOTE — Unmapped (Addendum)
Cameron Baker&Ox4, sinus tach in 120s in beginning of shift. Other VSS on room air to 28% vent when sleeping. Code sepsis called in beginning of shift for elevated lactate. 2L bolus given with zosyn, blood cultures drawn. Phenergan given PRN for nausea. Cameron slept most of night. Contact and droplet precautions maintained for rule out resp virus panel. Will continue to monitor and maintain safety.  Problem: Adult Inpatient Plan of Care  Goal: Plan of Care Review  Outcome: Progressing  Goal: Patient-Specific Goal (Individualization)  Outcome: Progressing  Goal: Absence of Hospital-Acquired Illness or Injury  Outcome: Progressing  Goal: Optimal Comfort and Wellbeing  Outcome: Progressing  Goal: Readiness for Transition of Care  Outcome: Progressing  Goal: Rounds/Family Conference  Outcome: Progressing     Problem: Asthma Comorbidity  Goal: Maintenance of Asthma Control  Outcome: Progressing     Problem: Hypertension Comorbidity  Goal: Blood Pressure in Desired Range  Outcome: Progressing     Problem: Pain Chronic (Persistent) (Comorbidity Management)  Goal: Acceptable Pain Control and Functional Ability  Outcome: Progressing     Problem: Communication Impairment (Mechanical Ventilation, Invasive)  Goal: Effective Communication  Outcome: Progressing     Problem: Device-Related Complication Risk (Mechanical Ventilation, Invasive)  Goal: Optimal Device Function  Outcome: Progressing     Problem: Inability to Wean (Mechanical Ventilation, Invasive)  Goal: Mechanical Ventilation Liberation  Outcome: Progressing     Problem: Nutrition Impairment (Mechanical Ventilation, Invasive)  Goal: Optimal Nutrition Delivery  Outcome: Progressing     Problem: Skin and Tissue Injury (Mechanical Ventilation, Invasive)  Goal: Absence of Device-Related Skin and Tissue Injury  Outcome: Progressing     Problem: Ventilator-Induced Lung Injury (Mechanical Ventilation, Invasive)  Goal: Absence of Ventilator-Induced Lung Injury  Outcome: Progressing

## 2018-06-28 NOTE — Unmapped (Signed)
Drawing blood cultures

## 2018-06-29 DIAGNOSIS — R0602 Shortness of breath: Principal | ICD-10-CM

## 2018-06-29 LAB — COMPREHENSIVE METABOLIC PANEL
ALBUMIN: 3.7 g/dL (ref 3.5–5.0)
ALKALINE PHOSPHATASE: 58 U/L (ref 38–126)
ALT (SGPT): 28 U/L (ref 19–72)
ANION GAP: 9 mmol/L (ref 9–15)
AST (SGOT): 21 U/L (ref 19–55)
BILIRUBIN TOTAL: 0.3 mg/dL (ref 0.0–1.2)
BUN / CREAT RATIO: 11
CALCIUM: 9.1 mg/dL (ref 8.5–10.2)
CHLORIDE: 103 mmol/L (ref 98–107)
CO2: 25 mmol/L (ref 22.0–30.0)
CREATININE: 0.81 mg/dL (ref 0.70–1.30)
EGFR CKD-EPI AA MALE: >90 mL/min/{1.73_m2} (ref >=60)
EGFR CKD-EPI NON-AA MALE: >90 mL/min/{1.73_m2} (ref >=60)
GLUCOSE RANDOM: 150 mg/dL (ref 65–179)
POTASSIUM: 4.2 mmol/L (ref 3.5–5.0)
PROTEIN TOTAL: 6.4 g/dL — ABNORMAL LOW (ref 6.5–8.3)
SODIUM: 137 mmol/L (ref 135–145)

## 2018-06-29 LAB — CBC
MEAN CORPUSCULAR HEMOGLOBIN CONC: 32.4 g/dL (ref 31.0–37.0)
MEAN CORPUSCULAR HEMOGLOBIN: 25 pg — ABNORMAL LOW (ref 26.0–34.0)
MEAN CORPUSCULAR VOLUME: 77 fL — ABNORMAL LOW (ref 80.0–100.0)
MEAN PLATELET VOLUME: 7.1 fL (ref 7.0–10.0)
PLATELET COUNT: 481 10*9/L — ABNORMAL HIGH (ref 150–440)
RED CELL DISTRIBUTION WIDTH: 15.9 % — ABNORMAL HIGH (ref 12.0–15.0)
WBC ADJUSTED: 14 10*9/L — ABNORMAL HIGH (ref 4.5–11.0)

## 2018-06-29 LAB — HEPATIC FUNCTION PANEL
ALKALINE PHOSPHATASE: 84 U/L (ref 38–126)
AST (SGOT): 31 U/L (ref 19–55)
BILIRUBIN DIRECT: <0.1 mg/dL (ref 0.00–0.40)
BILIRUBIN TOTAL: 0.3 mg/dL (ref 0.0–1.2)
PROTEIN TOTAL: 7.5 g/dL (ref 6.5–8.3)

## 2018-06-29 LAB — ALBUMIN: Albumin:MCnc:Pt:Ser/Plas:Qn:: 4.5

## 2018-06-29 LAB — POTASSIUM: Potassium:SCnc:Pt:Ser/Plas:Qn:: 4.2

## 2018-06-29 LAB — MEAN CORPUSCULAR VOLUME: Lab: 77 — ABNORMAL LOW

## 2018-06-29 NOTE — Unmapped (Signed)
Patient did well with doing all of their treatments today.

## 2018-06-29 NOTE — Unmapped (Addendum)
Cameron Baker is a 27 y.o. male well known to the service with PMHx of VATER syndrome, trach dependent, most notable for almost intractable admissions for recurrent tracheobronchitis, asthma, and hypogammaglobulinemia who presents with acute respiratory distress suggestive of acute asthma exacerbation.    Acute Respiratory Distress: Patient presented with 1.5 days progressive chest tightness, wheezing, cough he thinks is 2/2 humidity and pollen??acutely worse while outside at picnic table. RR in 30-50 range satting 98% on 8L trach collar (baseline O2) w/ EMS and on arrival in ED. Improved with 5mg  albuterol and 1L NS alone. HDS breathing comfortably at baseline home O2 req on my assessment. Initial labs notable for WBC 14, VBG pH 7.41 pCO2 37, lactate 4.0.??Suspect acute asthma exacerbation vs sepsis.  Patient was continued on his asthma medications and started on a steroid burst of prednisone 40 mg for 5 days.  Despite initial downtrending lactate, patient's lactate began to rise.  Code sepsis was called.  See below.  - Repeat CXR  - Budesonide dose -> 1mg  BID    Lactic Acidosis/Suspected Sepsis: Patient presented with an initial lactate of 4.0. This was presumed to be secondary to his increased work of breathing from a asthma exacerbation without initial concern for sepsis.  He did receive 1 dose of cefepime in the ED. His lactate improved with 1 L of fluid and albuterol to 2.5, however during the 24 hours following his admission his lactate climbed to 3.5.  At this time, code sepsis was called given persistently elevated lactate and a chest x-ray findings of patchy bibasilar pulmonary opacities possible for pneumonia.  Blood cultures were drawn*** the patient was given fluid resuscitation, and Zosyn was started for antibiotic coverage of HAP (given frequent hospitalizations) versus aspiration pneumonia.  Further research showed a persistently elevated lactate and this patient that may be related to his VATER syndrome and possible mitochondrial disorder.  Patient was scheduled to see a geneticist at some point, however this never happened.  Despite meeting criteria for sepsis, it is unclear if this patient is actively septic. We will wait to see the results of the blood cultures continue antibiotics in the interim.  - blood cx NGTD  - RPP negative    Recurrent Admissions:??Several??in last few months, most recently d/c on 7/17. Currently between homes staying with friend in [place]. Says it's comfy and just until August. This admission seems very real and acute, but despite maximal therapies he continues to return. APS has shown concern for his home situation but he maintains it's safe, clean, and cool. No obvious allergens besides summer pollen and humidity.  Consider social work and/or allergist to better assess the situation and the cause for recurrent exacerbations.

## 2018-06-29 NOTE — Unmapped (Signed)
Care Management  Initial Transition Planning Assessment  H&P  Cameron Baker is a 27 y.o. male well known to the service with PMHx of VATER syndrome most notable for almost intractable admissions for recurrent tracheobronchitis, asthma, and hypogammaglobulinemia who presents with acute dyspnea.            General  Care Manager assessed the patient by : Medical record review, Discussion with Clinical Care team. SW consult placed. CM completed brief assessment.   Orientation Level: Oriented X4    Contact/Decision Maker        Extended Emergency Contact Information  Primary Emergency Contact: [name] [place] of [place]  Home Phone: [phone]  Mobile Phone: [phone]  Relation: Mother    Legal Next of Kin / Guardian / POA / Advance Directives       Advance Directive (Medical Treatment)  Does patient have an advance directive covering medical treatment?: Patient does not have advance directive covering medical treatment.  Reason patient does not have an advance directive covering medical treatment:: Patient does not wish to complete one at this time  Reason there is not a Health Care Decision Maker appointed:: Patient does not wish to appoint a Health Care Decision Maker at this time  Information provided on advance directive:: No  Patient requests assistance:: No    Advance Directive (Mental Health Treatment)  Does patient have an advance directive covering mental health treatment?: Patient does not have advance directive covering mental health treatment.  Reason patient does not have an advance directive covering mental health treatment:: Patient does not wish to complete one at this time.    Patient Information  Lives with: Friends    Type of Residence: Private residence        Location/Detail: [address], [place]    Support Systems: Parent, Friends/Neighbors, Family Members         Home Care services in place prior to admission?: Yes  Type of Home Care services in place prior to admission: Home health (specify)  Current Home Care provider (Name/Phone #): [facility]  Discharge Needs Assessment  Concerns to be Addressed:      Clinical Risk Factors:      Prior overnight hospital stay or ED visit in last 90 days: Yes       Discharge Facility/Level of Care Needs:      Readmission  Risk of Unplanned Readmission Score: UNPLANNED READMISSION SCORE: 70%  Readmitted Within the Last 30 Days? Yes     Discharge Plan     Expected Discharge Date: 07/02/18    Expected Transfer from Critical Care: 07/02/18     Triggers for social work consultation identified on admission.  Social worker consulted for full psychosocial and discharge needs assessment.  CM will continue to follow for avoidable delays and opportunities for progression of care.   06/29/2018 7:46 AM    Initial Assessment complete?: Yes     Daune Perch, RN, MSN  Care Manager  Phone: [phone]; Pager: [phone]

## 2018-06-29 NOTE — Unmapped (Signed)
All scheduled inhaled medications given.  Pt currently on RA, refusing to wear ATC, PMV is in place.  Trilogy vent at bedside for use at night.  Pt completed own trach care.  Janina Mayo is patent and secure.  Patient currently in no distress, RT will continue to monitor.

## 2018-06-29 NOTE — Unmapped (Signed)
Pt remains alert and oriented x4.  No c/o pain this shift.  NSR to ST on monitor, bp stable, remains afebrile and sats maintained on room air while awake and 28% trilogy vent qhs.  Given prn IV phenergan x2 for nausea.  Will cont to monitor.       Problem: Adult Inpatient Plan of Care  Goal: Plan of Care Review  Outcome: Progressing

## 2018-06-29 NOTE — Unmapped (Signed)
Daily Progress Note    24hr Events: No acute events overnight. Continues to report no improvement to breathing, still feeling short of breath. No increased oxygen requirements, resting comfortably while laying flat during visit.     Assessment/Plan:    Cameron Baker is a 27 y.o. male who presented to [facility] with Acute respiratory distress.    Principal Problem:    Acute respiratory distress   Active Problems:    Asthma    Dysphagia    Esophageal reflux    Hypothyroidism    Class 1 obesity in adult    Seizures (CMS-HCC)    Gastrostomy status (CMS-HCC)    Stricture and stenosis of esophagus    Mood disorder (CMS-HCC)    Personality disorder (CMS-HCC)    Chronic tracheobronchitis (CMS-HCC)    Congenital tracheomalacia    Tracheostomy dependence (CMS-HCC)    VATER syndrome    Hypogammaglobulinemia (CMS-HCC)    Dyspnea    Acute asthma exacerbation  Resolved Problems:    * No resolved hospital problems. *    Cameron Baker is a 27 y.o. male well known to the service with PMHx of VATER syndrome most notable for almost intractable admissions for recurrent tracheobronchitis, asthma, and hypogammaglobulinemia who presents with acute respiratory distress  ??  Acute on Chronic Respiratory Distress 2/2 acute asthma exacerbation: 1.5 days progressive chest tightness, wheezing, cough he attributes to humidity and pollen, symptoms acutely worse while outside. RR in 30-50 range satting 98% on 8L trach collar (baseline O2) w/ EMS and on arrival in ED. Improved with 5mg  albuterol and 1L NS alone. HDS breathing comfortably at baseline home O2 req on my assessment. Initial labs notable for WBC 14, VBG pH 7.41 pCO2 37, lactate 4.0. Suspect acute asthma exacerbation c/b chronic tracheomalacia. Unlikely to be worsening tracheomalacia given improvement with albuterol.  - F/u repeat lactate (see below)  - Follow up with pulmonologist, discuss possible environmental allergens/triggers  - Continue arformoterol, budesonide, azithro MWF, montelukast, flonase nasal, TOBI nebs, saline airway clearance  - budesonide dose returned to 1mg  BID  - Prednisone burst (7/21-7/25)  - Discuss case with primary pulmonologist Dr. Garner Nash  [ ]  repeat CXR 7/23    Lactic Acidosis/Suspected Sepsis: Patient presented with an initial lactate of 4.0.  This was presumed to be secondary to his increased work of breathing from a asthma exacerbation without initial concern for sepsis.  His lactate improved with 1 L of fluid and albuterol to 2.5, however during the 24 hours following his admission his lactate climbed to 3.5.  At this time, code sepsis was called given persistently elevated lactate and a chest x-ray findings of patchy bibasilar pulmonary opacities possible for pneumonia.  Blood cultures were drawn the patient was given fluid resuscitation, and Zosyn was started for antibiotic coverage of HAP (given frequent hospitalizations) versus aspiration pneumonia.  Further research showed a persistently elevated lactate and this patient that may be related to his VATER syndrome and possible mitochondrial disorder.  Patient was scheduled to see a geneticist at some point, however this never happened.  Despite meeting criteria for sepsis, it is unclear if this patient is actively septic. We will wait to see the results of the blood cultures continue antibiotics in the interim. Blood cultures NG at 24 hours on 7/23.  - S/p cefepime in ED  - Zosyn until blood cultures are negative at 48 hours  - RPP negative  - Prior lower respiratory cultures show oropharyngeal flora  ??  Recurrent Admissions:  Several in last few months, most recently d/c on 7/17. Currently between homes staying with friend in [place]. Concern for home stability, allergens contributing to frequent admissions. Per SW, moving to new apartment on 8/2.   ??  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  Bipolar 1 Disorder/PTSD: Continue home Celexa, Valproate, Remeron   ??  Hypothyroidism: Continue home??Synthroid   ??  Acne: Continue home minocycline??  ??  Daily Checklist:  Diet: Regular Diet  DVT PPx: Lovenox 40mg  q24h   GI PPx: Home PPI  Code Status: Full Code  Dispo: Admit to [facility]  ___________________________________________________________________    Subjective:  No acute events overnight    Labs/Studies:  Labs and Studies from the last 24hrs per EMR and Reviewed       Objective:  Temp:  [36.4 ??C-36.8 ??C] 36.5 ??C  Heart Rate:  [69-114] 69  SpO2 Pulse:  [69-90] 69  Resp:  [13-21] 13  BP: (122-155)/(62-76) 122/65  FiO2 (%):  [28 %] 28 %  SpO2:  [96 %-100 %] 100 %    General appearance -Tired appearing, no distress, sleeping but easily awoken.  Head - NCAT, EOMI, trach in place  Chest - LCTAB, no wheezes appreciated  Heart - normal rate, regular rhythm, no murmurs  Abdomen - soft, non-tender, G button in place with chronic rash around site  Neurological - no focal neurological deficits  Musculoskeletal - no joint tenderness, deformity or swelling  Extremities - No pedal edema, no clubbing or cyanosis  Skin - normal coloration, rash around G button

## 2018-06-30 LAB — CBC
HEMATOCRIT: 37.5 % — ABNORMAL LOW (ref 41.0–53.0)
MEAN CORPUSCULAR HEMOGLOBIN CONC: 31.4 g/dL (ref 31.0–37.0)
MEAN CORPUSCULAR HEMOGLOBIN: 24.5 pg — ABNORMAL LOW (ref 26.0–34.0)
MEAN CORPUSCULAR VOLUME: 78.2 fL — ABNORMAL LOW (ref 80.0–100.0)
MEAN PLATELET VOLUME: 7.8 fL (ref 7.0–10.0)
PLATELET COUNT: 443 10*9/L — ABNORMAL HIGH (ref 150–440)
RED BLOOD CELL COUNT: 4.8 10*12/L (ref 4.50–5.90)
WBC ADJUSTED: 13.2 10*9/L — ABNORMAL HIGH (ref 4.5–11.0)

## 2018-06-30 LAB — COMPREHENSIVE METABOLIC PANEL
ALBUMIN: 3.8 g/dL (ref 3.5–5.0)
ALKALINE PHOSPHATASE: 46 U/L (ref 38–126)
ALT (SGPT): 31 U/L (ref 19–72)
ANION GAP: 9 mmol/L (ref 9–15)
AST (SGOT): 14 U/L — ABNORMAL LOW (ref 19–55)
BILIRUBIN TOTAL: 0.4 mg/dL (ref 0.0–1.2)
BLOOD UREA NITROGEN: 12 mg/dL (ref 7–21)
BUN / CREAT RATIO: 15
CALCIUM: 9 mg/dL (ref 8.5–10.2)
CHLORIDE: 100 mmol/L (ref 98–107)
CO2: 28 mmol/L (ref 22.0–30.0)
CREATININE: 0.79 mg/dL (ref 0.70–1.30)
EGFR CKD-EPI AA MALE: >90 mL/min/{1.73_m2} (ref >=60)
EGFR CKD-EPI NON-AA MALE: >90 mL/min/{1.73_m2} (ref >=60)
POTASSIUM: 4.1 mmol/L (ref 3.5–5.0)
PROTEIN TOTAL: 6.6 g/dL (ref 6.5–8.3)
SODIUM: 137 mmol/L (ref 135–145)

## 2018-06-30 LAB — CALCIUM: Calcium:MCnc:Pt:Ser/Plas:Qn:: 9

## 2018-06-30 LAB — ALT (SGPT): Alanine aminotransferase:CCnc:Pt:Ser/Plas:Qn:: 31

## 2018-06-30 LAB — HEPATIC FUNCTION PANEL
ALKALINE PHOSPHATASE: 46 U/L (ref 38–126)
ALT (SGPT): 31 U/L (ref 19–72)
BILIRUBIN DIRECT: <0.1 mg/dL (ref 0.00–0.40)
BILIRUBIN TOTAL: 0.4 mg/dL (ref 0.0–1.2)

## 2018-06-30 LAB — HEMATOCRIT: Lab: 37.5 — ABNORMAL LOW

## 2018-06-30 MED ORDER — GUAIFENESIN ER 600 MG TABLET, EXTENDED RELEASE 12 HR
ORAL_TABLET | Freq: Two times a day (BID) | ORAL | 0 refills | 0 days | Status: CP
Start: 2018-06-30 — End: 2018-08-19

## 2018-06-30 NOTE — Unmapped (Signed)
Patient did well with scheduled inhaled medications. RT performed trach car, patient placed on home vent for the night. No complications, will continue to monitor.

## 2018-06-30 NOTE — Unmapped (Signed)
Cameron Baker states that he will go home this afternoon, but he doesn't feel that he is ready.  He says that his Cameron Baker is acting up.  We talked for a while about random events, he was not having any respiratory distress.  He states that things are going well with [facility] as his ventilator provider and he states that [facility] has picked up his old ventilator.  He confirms that his cough assist and vent are at the group home in [place].  Cameron Baker states that he does not have any needs that I can assist him with at this time.  He says that he is tired, asked me to turn off the lights and shut the curtains on my way out.

## 2018-06-30 NOTE — Unmapped (Signed)
Pt remains alert and oriented x4.  No c/o pain this shift.  NSR to ST on monitor, bp stable, remains afebrile and sats maintained on room air while awake and 28% trilogy vent qhs.  Given prn IV phenergan x2 for nausea.   Chest xray completed this shift.  Will cont to monitor.     ??      Problem: Adult Inpatient Plan of Care  Goal: Plan of Care Review  Outcome: Progressing  Goal: Patient-Specific Goal (Individualization)  Outcome: Progressing  Goal: Absence of Hospital-Acquired Illness or Injury  Outcome: Progressing  Goal: Optimal Comfort and Wellbeing  Outcome: Progressing  Goal: Readiness for Transition of Care  Outcome: Progressing  Goal: Rounds/Family Conference  Outcome: Progressing     Problem: Asthma Comorbidity  Goal: Maintenance of Asthma Control  Outcome: Progressing     Problem: Hypertension Comorbidity  Goal: Blood Pressure in Desired Range  Outcome: Progressing     Problem: Pain Chronic (Persistent) (Comorbidity Management)  Goal: Acceptable Pain Control and Functional Ability  Outcome: Progressing     Problem: Communication Impairment (Mechanical Ventilation, Invasive)  Goal: Effective Communication  Outcome: Progressing     Problem: Device-Related Complication Risk (Mechanical Ventilation, Invasive)  Goal: Optimal Device Function  Outcome: Progressing     Problem: Inability to Wean (Mechanical Ventilation, Invasive)  Goal: Mechanical Ventilation Liberation  Outcome: Progressing     Problem: Nutrition Impairment (Mechanical Ventilation, Invasive)  Goal: Optimal Nutrition Delivery  Outcome: Progressing     Problem: Skin and Tissue Injury (Mechanical Ventilation, Invasive)  Goal: Absence of Device-Related Skin and Tissue Injury  Outcome: Progressing     Problem: Ventilator-Induced Lung Injury (Mechanical Ventilation, Invasive)  Goal: Absence of Ventilator-Induced Lung Injury  Outcome: Progressing

## 2018-07-01 MED ORDER — PREDNISONE 20 MG TABLET
ORAL_TABLET | Freq: Every day | GASTROSTOMY | 0 refills | 0.00000 days | Status: CP
Start: 2018-07-01 — End: 2018-07-29

## 2018-07-01 NOTE — Unmapped (Signed)
Physician Discharge Summary    Identifying Information:   Cameron Baker  1991/10/29  [id number]    Admit date: 06/27/2018    Discharge date: 06/30/2018     Discharge Service: Pulmonology (MDG)    Discharge Attending Physician: Maple Hudson, MD    Discharge to: Home with Home Health and/or PT/OT    Discharge Diagnoses:  Principal Problem:    Acute respiratory distress   Active Problems:    Asthma    Dysphagia    Esophageal reflux    Hypothyroidism    Class 1 obesity in adult    Seizures (CMS-HCC)    Gastrostomy status (CMS-HCC)    Stricture and stenosis of esophagus    Mood disorder (CMS-HCC)    Personality disorder (CMS-HCC)    Chronic tracheobronchitis (CMS-HCC)    Congenital tracheomalacia    Tracheostomy dependence (CMS-HCC)    VATER syndrome    Hypogammaglobulinemia (CMS-HCC)    Dyspnea    Acute asthma exacerbation  Resolved Problems:    * No resolved hospital problems. *      Hospital Course:   Cameron Baker a 27 y.o.??male??with PMHx of??VATER syndrome, trach dependent,  recurrent admissions for tracheobronchitis, asthma, and hypogammaglobulinemia who presented with acute respiratory distress suggestive of acute asthma exacerbation.  ??  Acute on chronic respiratory distress due to acute asthma exacerbation: Patient presented with 1.5 days progressive chest tightness, wheezing, cough. Respiratory distress improved with albuterol and bolus of IV fluids, was resumed on baseline O2 requirement. Given improvement with albuterol and trigger of recent heat/humidity, suspected asthma exacerbation. Infectious work up, including respiratory pathogen panel, negative. Home medications continued, started on steroid burst of prednisone 40mg  for 5 days. Symptoms improved by time of discharge, back on home O2 requirements, repeat chest x-ray improved. Patient will follow up with outpatient PCP and pulmonologist.   ??  Lactic Acidosis:??Presented with an initial lactate of 4.0. Initial concern for sepsis, but infectious work up negative and lactate remained elevated with appropriate therapy and fluid resuscitation. Zosyn initially started due to concern, but discontinued on day of discharge given afebrile, negative blood cultures. Upon chart review, patient has a persistently elevated lactate. May be related to VATER syndrome or underlying mitochondrial disorder. At one time was scheduled to see a geneticist, but never occurred. PCP can consider pursuing further genetic work up in the future.   ??  Recurrent Admissions:??Multiple admissions over last several months. Concern for environmental allergens exacerbating asthma, especially given that he has been staying with friends recently. Social work saw patient, he has plans to move to new apartment in August. Can consider allergy work up in future if symptoms continue to cause recurrent admissions.     Home medications were continued for chronic conditions, including type II achalasia, bipolar I disorder, PTSD, hypothyroidism, acne.     Post Discharge Follow Up Issues:   -If symptoms persist past discontinuation of prednisone, consider extending course to taper  -Follow up CF sputum culture sent on day of discharge  -Consider outpatient evaluation of environmental allergens, asthma triggers  -Can re-pursue the idea of genetics evaluation, given persistently elevated lactate    Procedures:  None  No admission procedures for hospital encounter.  _____________________________________________________________________________  Discharge Day Services:  BP 129/64  - Pulse 88  - Temp 37 ??C (Oral)  - Resp 12  - Ht 167.6 cm (5' 5.98)  - Wt 100 kg (220 lb 7.4 oz)  - SpO2 99%  - BMI 35.60 kg/m??  Pt seen on the day of discharge and determined appropriate for discharge.    Condition at Discharge: fair    Length of Discharge: I spent greater than 30 mins in the discharge of this patient.  _____________________________________________________________________________  Discharge Medications:     Your Medication List      STOP taking these medications    codeine-guaifenesin 10-100 mg/5 mL liquid  Commonly known as:  GUAIFENESIN AC        START taking these medications    guaiFENesin 600 mg 12 hr tablet  Commonly known as:  MUCINEX  Take 1 tablet (600 mg total) by mouth Two (2) times a day. for 7 days     predniSONE 20 MG tablet  Commonly known as:  DELTASONE  2 tablets (40 mg total) by G-tube route daily. for 1 day  Start taking on:  July 01, 2018        CHANGE how you take these medications    diclofenac sodium 1 % gel  Commonly known as:  VOLTAREN  Apply 2 g topically Four (4) times a day.  What changed:    ?? when to take this  ?? reasons to take this        CONTINUE taking these medications    acetaminophen 160 mg/5 mL (5 mL) suspension  Commonly known as:  TYLENOL  15.6 mL (500 mg total) by G-tube route every four (4) hours as needed for pain.     albuterol 90 mcg/actuation inhaler  Commonly known as:  PROVENTIL HFA;VENTOLIN HFA  Inhale 2 puffs every six (6) hours as needed for wheezing.     arformoterol  Commonly known as:  BROVANA  Inhale 2 mL (15 mcg total) by nebulization Two (2) times a day.     azithromycin 500 MG tablet  Commonly known as:  ZITHROMAX  1 tablet (500 mg total) by G-tube route 3 (three) times a week.     budesonide 0.5 mg/2 mL nebulizer solution  Commonly known as:  PULMICORT  Inhale 4 mL (1 mg total) by nebulization Two (2) times a day.     budesonide-formoterol 160-4.5 mcg/actuation inhaler  Commonly known as:  SYMBICORT  Inhale 2 puffs Two (2) times a day.     cetirizine 10 MG tablet  Commonly known as:  ZyrTEC  1 tablet (10 mg total) by G-tube route every evening.     cholecalciferol (vitamin D3) 2,000 unit Cap  3 capsules (6,000 Units total) by G-tube route daily.     citalopram 40 MG tablet  Commonly known as:  CeleXA  1 tablet (40 mg total) by G-tube route nightly.     clindamycin 1 % gel  Commonly known as:  CLINDAGEL  Apply topically Two (2) times a day. cyclobenzaprine 5 MG tablet  Commonly known as:  FLEXERIL  Take 1 tablet (5 mg total) by mouth Three (3) times a day as needed for muscle spasms.     dicyclomine 10 mg capsule  Commonly known as:  BENTYL  10 mg by G-tube route 4 (four) times a day as needed.     fluticasone propionate 50 mcg/actuation nasal spray  Commonly known as:  FLONASE  2 sprays by Each Nare route daily.     gabapentin 300 MG capsule  Commonly known as:  NEURONTIN  300 mg by G-tube route Three (3) times a day.     ibuprofen 600 MG tablet  Commonly known as:  ADVIL,MOTRIN  1 tablet (600 mg total) by  G-tube route every six (6) hours as needed for pain.     ipratropium-albuterol 0.5-2.5 mg/3 mL nebulizer  Commonly known as:  DUO-NEB  Inhale 3 mL 4 (four) times a day.     levothyroxine 75 MCG tablet  Commonly known as:  SYNTHROID, LEVOTHROID  1 tablet (75 mcg total) by G-tube route daily.     lidocaine-prilocaine cream  Commonly known as:  EMLA  Apply topically daily as needed. For port access     linaCLOtide 290 mcg capsule  Commonly known as:  LINZESS  1 capsule (290 mcg total) by G-tube route daily.     LORazepam 0.5 MG tablet  Commonly known as:  ATIVAN  Take 1 tablet (0.5mg ) twice daily and 1 tablet (0.5mg ) daily as needed for anxiety via G-tube     minocycline 100 MG capsule  Commonly known as:  MINOCIN,DYNACIN  1 capsule (100 mg total) by G-tube route nightly.     mirtazapine 15 MG tablet  Commonly known as:  REMERON  1 tablet (15 mg total) by G-tube route nightly.     montelukast 10 mg tablet  Commonly known as:  SINGULAIR  Take 1 tablet (10 mg total) by mouth nightly.     omeprazole 20 MG capsule  Commonly known as:  PriLOSEC  20 mg Two (2) times a day (30 minutes before a meal). Via G-tube     PARI LC D NEBULIZER Misc  Generic drug:  nebulizers  Provide 1 device  for use with inhaled medication.     polyethylene glycol 17 gram packet  Commonly known as:  MIRALAX  17 g by G-tube route daily as needed.     promethazine 6.25 mg/5 mL syrup Commonly known as:  PHENERGAN  Take 10 mL (12.5 mg total) by mouth 4 (four) times a day as needed for nausea. for up to 7 days     sodium chloride 0.9 % Soln  Commonly known as:  NS  Infuse 10 mL into a venous catheter 4 (four) times a day as needed (port flush).     sodium chloride 3 % nebulizer solution  Inhale 4 mL by nebulization 4 (four) times a day.     tobramycin (PF) 300 mg/5 mL nebulizer solution  Commonly known as:  TOBI  Inhale 5 mL (300 mg total) by nebulization every twelve (12) hours. Every other month     triamcinolone 0.1 % cream  Commonly known as:  KENALOG  Apply topically Two (2) times a day.     valproate 250 mg/5 mL syrup  Commonly known as:  DEPAKENE  Take 5ml (250mg ) by G-tube every morning and 15ml (750mg ) every evening          _____________________________________________________________________________  Pending Test Results (if blank, then none):   Order Current Status    Lower Respiratory Culture In process    Blood Culture Preliminary result    Blood Culture Preliminary result          Most Recent Labs:  Microbiology Results (last day)     Procedure Component Value Date/Time Date/Time    Lower Respiratory Culture [[id number]] Collected:  06/30/18 1348    Lab Status:  In process Specimen:  SPUTUM EXPECTORATED Updated:  06/30/18 1359    Blood Culture [[id number]] Collected:  06/27/18 2227    Lab Status:  Preliminary result Specimen:  Blood from 1 Peripheral Draw Updated:  06/29/18 2245     Blood Culture, Routine No Growth at 48 hours  Blood Culture [[id number]] Collected:  06/27/18 2230    Lab Status:  Preliminary result Specimen:  Blood from 1 Peripheral Draw Updated:  06/29/18 2245     Blood Culture, Routine No Growth at 48 hours          Lab Results   Component Value Date    WBC 13.2 (H) 06/30/2018    HGB 11.8 (L) 06/30/2018    HCT 37.5 (L) 06/30/2018    PLT 443 (H) 06/30/2018       Lab Results   Component Value Date    NA 137 06/30/2018    K 4.1 06/30/2018    CL 100 06/30/2018    CO2 28.0 06/30/2018    BUN 12 06/30/2018    CREATININE 0.79 06/30/2018    CALCIUM 9.0 06/30/2018    MG 1.6 06/11/2018    PHOS 4.1 03/29/2018       Lab Results   Component Value Date    ALKPHOS 46 06/30/2018    ALKPHOS 46 06/30/2018    BILITOT 0.4 06/30/2018    BILITOT 0.4 06/30/2018    BILIDIR <0.10 06/30/2018    PROT 6.6 06/30/2018    PROT 6.6 06/30/2018    ALBUMIN 3.8 06/30/2018    ALBUMIN 3.8 06/30/2018    ALT 31 06/30/2018    ALT 31 06/30/2018    AST 14 (L) 06/30/2018    AST 14 (L) 06/30/2018       Lab Results   Component Value Date    PT 11.1 05/03/2018    INR 0.97 05/03/2018     Hospital Radiology:  Xr Chest 1 View Portable    Result Date: 06/27/2018  EXAM: XR CHEST PORTABLE DATE: 06/27/2018 3:16 AM ACCESSION: [id number] UN DICTATED: 06/27/2018 3:18 AM INTERPRETATION LOCATION: [facility] CLINICAL INDICATION: 27 years old Male with SHORTNESS OF BREATH  COMPARISON: 06/19/2018 TECHNIQUE: Portable Chest Radiograph. FINDINGS: Right-sided Port-A-Cath in place with catheter tip terminating over the cavoatrial junction. Tracheostomy cannula in place over the proximal tracheal air column with tip in between the clavicular heads Lungs are mildly hypoinflated. Patchy bibasilar pulmonary opacities, which may represent infection/aspiration. Mild pulmonary edema. No pleural effusion or pneumothorax. Stable cardiomegaly.     -- Patchy bibasilar pulmonary opacities, worrisome for infection/aspiration. Mild bibasilar atelectasis. -- Mild pulmonary edema.    Xr Chest 2 Views    Result Date: 06/30/2018  EXAM: XR CHEST 2 VIEWS DATE: 06/29/2018 8:11 PM ACCESSION: [id number] UN DICTATED: 06/30/2018 8:02 AM INTERPRETATION LOCATION: [facility] CLINICAL INDICATION: 27 years old Male with DYSPNEA  COMPARISON: 06/27/2018 TECHNIQUE: PA and Lateral Chest Radiographs. FINDINGS: Tracheostomy tube projects over the tracheal air column. Similar position right chest port catheter. No focal airspace disease. No pleural effusion or pneumothorax. Stable cardiomediastinal silhouette.     No acute airspace disease.      _____________________________________________________________________________  Discharge Instructions:               Follow Up instructions and Outpatient Referrals     Ambulatory referral to Home Health      Disciplines requested:  Nursing    Nursing requested:  Other: (please enter in comments)    Physician to follow patient's care:  PCP    Requested start of care date:  Routine (within 48 hours)    Special instructions:  Please resume private duty nursing according to previous schedule.    I certify that Cameron Baker is confined to his/her home and needs intermittent skilled nursing care, physical therapy and/or speech therapy or continues  to need occupational therapy. The patient is under my care, and I have authorized services on this plan of care and will periodically review the plan. The patient had a face-to-face encounter with an allowed provider type on 06/30/2018 and the encounter was related to the primary reason for home health care.         Discharge instructions      Cameron Baker, Cameron Baker were admitted to [facility] for shortness of breath and cough, and have been treated for an acute asthma exacerbation.    Please carefully read and follow these instructions below upon your discharge:    1) Please take your medications as prescribed and note the changes listed on your discharge. At future follow-up appointments, please be sure to take all of your medications with you so your provider can better guide your care.     2) Seek medical care with your primary care doctor or local Emergency Room or Urgent Care if you develop any changes in your mental status, worsening abdominal pain, fevers greater than 101.5, any unexplained/unrelieved shortness of breath, uncontrolled nausea and vomiting that keeps you from remaining hydrated or taking your medication, or any other concerning symptoms.     3) Please go to your follow-up appointments. Some of your follow-up appointments have been listed below. If you do not see an appointment listed below with your primary care doctor, please call your doctor's office as soon as possible to schedule an appointment to be seen within 7-10 days of discharge.     4) If you have any concerns before you are able to follow-up with your primary care doctor, you can reach [place] by calling [phone] and asking to page the pulmonary fellow on call. You can also call [phone] to get in contact with Dr. Garner Nash' clinic. If you feel that your symptoms worsen after discontinuing prednisone, please reach out to either your primary care provider or pulmonologist to discuss the possibility of extending your course of steroids.     It was a pleasure taking care of you.               Appointments which have been scheduled for you    Jul 05, 2018 10:20 AM EDT  (Arrive by 9:50 AM)  RETURN  GENERAL with Liane Comber, MD  [facility] MEDICINE [facility] REGION) [address]  [place] [address]  [phone]      Jul 09, 2018 10:00 AM EDT  (Arrive by 9:40 AM)  Coy Saunas FM with [facility] TRANSITION CLINIC  [facility] FAMILY MEDICINE [place] [facility] REGION) [address]  [place] [address]  [phone]      Jul 09, 2018 10:40 AM EDT  (Arrive by 10:20 AM)  TRANSITIONS PROVIDER FM with Pollyann Glen, MD  [facility] FAMILY MEDICINE [place] [facility] REGION) [address]  [place] [address]  [phone]      Aug 10, 2018  8:45 AM EDT  (Arrive by 8:30 AM)  RETURN PFT 15 with [place] PFT 4  [facility] PULMONARY SPECIALTY FUNCT [place] [facility] REGION) [address]  [address]  [place] [address]  [phone]      Aug 10, 2018  9:00 AM EDT  (Arrive by 8:45 AM)  RETURN BRONCHIECTSIS with Truett Mainland, MD  [facility] PULMONARY SPECIALTY CL [place]  ([place]) [address]  [facility]  [address]  [place] [address]  [phone]      Aug 25, 2018  1:00 PM EDT  (Arrive by 12:45 PM)  RETURN  STEP with Rosezetta Schlatter, PMHNP  [facility] PSYCHIATRY STEP [facility]) [address] [place] [address]  [phone]

## 2018-07-02 ENCOUNTER — Encounter: Admit: 2018-07-02 | Discharge: 2018-07-03 | Payer: MEDICARE | Attending: Sports Medicine | Primary: Sports Medicine

## 2018-07-02 DIAGNOSIS — R110 Nausea: Principal | ICD-10-CM

## 2018-07-02 MED ORDER — PROMETHAZINE 6.25 MG/5 ML ORAL SYRUP
Freq: Four times a day (QID) | ORAL | 0 refills | 0 days | Status: CP | PRN
Start: 2018-07-02 — End: 2018-07-06

## 2018-07-03 NOTE — Unmapped (Signed)
ASSESSMENT:  Nausea that is 2/2 to prolonged abx and underlying medical issues    PLAN:  Phenergan refill 6.25mg /53ml - 10ml PO QID prn    Follow up as needed

## 2018-07-03 NOTE — Unmapped (Signed)
[facility] URGENT CARE AT THE  FAMILY MEDICINE CENTER CLINIC NOTE      07/02/2018    PCP: [name], MD     -----------------------------------------------------------  Cameron Baker is a 27 y.o. male that presents to urgent care clinic today regarding the following issues:    ASSESSMENT/PLAN:     Problem List Items Addressed This Visit        Other    Nausea - Primary     ASSESSMENT:  Nausea that is 2/2 to prolonged abx and underlying medical issues    PLAN:  Phenergan refill 6.25mg /33ml - 10ml PO QID prn    Follow up as needed         Relevant Medications    promethazine (PHENERGAN) 6.25 mg/5 mL syrup              SUBJECTIVE:  Chief Complaint   Patient presents with   ??? Medication Refill       HPI:  #Med refill    ?? Recently d/c and didn't get a rx for phenergan that he takes after IV abx make him nausea [place]  ??  Sees GI doc on Tuesday  ?? VATER syndrome     Review of Systems    Review of Systems   Gastrointestinal: Positive for nausea.       -----------------------------------------------------------  HISTORY:  I have reviewed the patients problem list, current medications, allergies, past medical & surgical history, social history and updated them as needed.    No LMP for male patient.  ___________________________________  ALLERGIES:  Allergies   Allergen Reactions   ??? Xopenex [Levalbuterol Hcl] Other (See Comments)     11/25/17: Bronchospasm with levalbuterol; the patient reports that he is able to tolerate albuterol without issues   ??? Allopurinol Analogues    ??? Versed [Midazolam]        -----------------------------------------------------------  OBJECTIVE:  BP 137/88  - Pulse 102  - Temp 37.2 ??C (98.9 ??F) (Oral)  - Wt 97.9 kg (215 lb 12.8 oz)  - BMI 34.85 kg/m??     Physical Exam   Constitutional: He is oriented to person, place, and time. He appears well-developed and well-nourished. No distress.   HENT:   Head: Normocephalic.   Neurological: He is alert and oriented to person, place, and time.   Skin: Skin is warm and dry. He is not diaphoretic.   Nursing note and vitals reviewed.        RTC:  Prn     A total of 15 minutes were spent face-to-face with the patient during this encounter and 10 minutes or over half of that time was spent on counseling and coordination of care.    -----------------------------------------------------------    Did today's visit save an ED/Direct Admission?  no    [facility]  [place] [facility] [place] [facility]  [address], [place], [place] [address] ??? Telephone [phone] ??? Fax [phone]  [email or web address]

## 2018-07-03 NOTE — Unmapped (Deleted)
[facility] ([facility])  Cameron Baker [facility] Faculty Consultation Return Visit Note      Gastroenterology Return Visit Note      Initial Referring Provider:  Truett Mainland, MD  [address]  [address]  [place], [place] [address]    Primary Care Provider:  [name], MD    Cameron Baker is a 27 y.o. male (DOB: 1991-05-31) who returns for follow up    ASSESSMENT:    ***      PLAN:    ***      Reason for Visit:      CHIEF COMPLAINT: ***    HISTORY OF PRESENT ILLNESS: This is a 27 y.o. year old male with VACTERL, tracheobronchomalacia, chronic tracheobronchitis, recurrent esophageal stricture (last dilated 3/19), and G tube for meds. He presented today for evaluation of recurrent esophageal strictures.  Patient was last seen in 3/19. Patient was switched to prilosec at that time and gaviscon was added. A manometry was done which seemed consistent with type II achalasia but was likely secondary to the strictures vs true type II. linzess and squatty potty for constipation. In the interim, the patient was admitted for dysphagia. EGD was done which was notable for two strictures. A 1.3 cm in diameter stricture was seen at the prior fistula site ( dilated to 18 mm). The distal stricture was mild and dilated to 18 mm as well. Patient has had recent admissions for acute asthma exacerbations ( thought to potentially be allergen related).     Switch omeprazole to 40 mg once daily- 30- 45 min before breakfast   Add Gaviscon at bedtime.    esophageal manometry ordered ( dysphagia).   Patient will call and inform us of recurrent dysphagia symptoms to schedule an EGD with dilation.   Continue Linzess 290 mcg daily.   Add squatty potty.   Follow up in 4 months.     CURRENT MEDICATIONS:      Current Outpatient Medications:   ???  acetaminophen (TYLENOL) 160 mg/5 mL (5 mL) suspension, 15.6 mL (500 mg total) by G-tube route every four (4) hours as needed for pain., Disp: 1 Bottle, Rfl: 0  ???  albuterol HFA 90 mcg/actuation inhaler, Inhale 2 puffs every six (6) hours as needed for wheezing., Disp: 1 Inhaler, Rfl: 11  ???  arformoterol (BROVANA), Inhale 2 mL (15 mcg total) by nebulization Two (2) times a day., Disp: 120 mL, Rfl: 11  ???  azithromycin (ZITHROMAX) 500 MG tablet, 1 tablet (500 mg total) by G-tube route 3 (three) times a week., Disp: 12 tablet, Rfl: 11  ???  budesonide (PULMICORT) 0.5 mg/2 mL nebulizer solution, Inhale 4 mL (1 mg total) by nebulization Two (2) times a day., Disp: 240 mL, Rfl: 11  ???  budesonide-formoterol (SYMBICORT) 160-4.5 mcg/actuation inhaler, Inhale 2 puffs Two (2) times a day., Disp: 1 Inhaler, Rfl: 11  ???  cetirizine (ZYRTEC) 10 MG tablet, 1 tablet (10 mg total) by G-tube route every evening., Disp: , Rfl: 0  ???  cholecalciferol, vitamin D3, (VITAMIN D3) 2,000 unit cap, 3 capsules (6,000 Units total) by G-tube route daily., Disp: , Rfl: 0  ???  citalopram (CELEXA) 40 MG tablet, 1 tablet (40 mg total) by G-tube route nightly., Disp: 90 tablet, Rfl: 0  ???  clindamycin (CLINDAGEL) 1 % gel, Apply topically Two (2) times a day., Disp: 60 g, Rfl: 6  ???  cyclobenzaprine (FLEXERIL) 5 MG tablet, Take 1 tablet (5 mg total) by mouth Three (3) times a day as needed for muscle spasms., Disp: 15 tablet, Rfl: 0  ???  diclofenac sodium (VOLTAREN) 1 % gel, Apply 2 g topically Four (4) times a day. (Patient taking differently: Apply 2 g topically 4 (four) times a day as needed. ), Disp: 100 g, Rfl: 6  ???  dicyclomine (BENTYL) 10 mg capsule, 10 mg by G-tube route 4 (four) times a day as needed. , Disp: , Rfl:   ???  fluticasone (FLONASE) 50 mcg/actuation nasal spray, 2 sprays by Each Nare route daily. , Disp: , Rfl:   ???  gabapentin (NEURONTIN) 300 MG capsule, 300 mg by G-tube route Three (3) times a day., Disp: , Rfl:   ???  guaiFENesin (MUCINEX) 600 mg 12 hr tablet, Take 1 tablet (600 mg total) by mouth Two (2) times a day. for 7 days, Disp: 14 tablet, Rfl: 0  ???  ibuprofen (ADVIL,MOTRIN) 600 MG tablet, 1 tablet (600 mg total) by G-tube route every six (6) hours as needed for pain., Disp: 30 tablet, Rfl: 1  ???  ipratropium-albuterol (DUO-NEB) 0.5-2.5 mg/3 mL nebulizer, Inhale 3 mL 4 (four) times a day. , Disp: , Rfl:   ???  levothyroxine (SYNTHROID, LEVOTHROID) 75 MCG tablet, 1 tablet (75 mcg total) by G-tube route daily., Disp: 90 tablet, Rfl: 3  ???  lidocaine-prilocaine (EMLA) cream, Apply topically daily as needed. For port access, Disp: 30 g, Rfl: 0  ???  linaclotide (LINZESS) 290 mcg capsule, 1 capsule (290 mcg total) by G-tube route daily., Disp: 30 capsule, Rfl: 0  ???  LORazepam (ATIVAN) 0.5 MG tablet, Take 1 tablet (0.5mg ) twice daily and 1 tablet (0.5mg ) daily as needed for anxiety via G-tube, Disp: 70 tablet, Rfl: 2  ???  minocycline (MINOCIN,DYNACIN) 100 MG capsule, 1 capsule (100 mg total) by G-tube route nightly., Disp: 90 capsule, Rfl: 3  ???  mirtazapine (REMERON) 15 MG tablet, 1 tablet (15 mg total) by G-tube route nightly., Disp: 90 tablet, Rfl: 0  ???  montelukast (SINGULAIR) 10 mg tablet, Take 1 tablet (10 mg total) by mouth nightly., Disp: 30 tablet, Rfl: 11  ???  omeprazole (PRILOSEC) 20 MG capsule, 20 mg Two (2) times a day (30 minutes before a meal). Via G-tube, Disp: , Rfl:   ???  PARI LC D NEBULIZER Misc, Provide 1 device  for use with inhaled medication., Disp: 2 each, Rfl: 11  ???  polyethylene glycol (MIRALAX) 17 gram packet, 17 g by G-tube route daily as needed., Disp: 20 packet, Rfl: 0  ???  promethazine (PHENERGAN) 6.25 mg/5 mL syrup, Take 10 mL (12.5 mg total) by mouth 4 (four) times a day as needed for nausea. for up to 7 days, Disp: 120 mL, Rfl: 0  ???  sodium chloride (NS) 0.9 % Soln, Infuse 10 mL into a venous catheter 4 (four) times a day as needed (port flush)., Disp: 60 vial, Rfl: 0  ???  sodium chloride 3 % nebulizer solution, Inhale 4 mL by nebulization 4 (four) times a day., Disp: 480 mL, Rfl: 1  ???  tobramycin, PF, (TOBI) 300 mg/5 mL nebulizer solution, Inhale 5 mL (300 mg total) by nebulization every twelve (12) hours. Every other month, Disp: 280 mL, Rfl: 5  ???  triamcinolone (KENALOG) 0.1 % cream, Apply topically Two (2) times a day., Disp: 15 g, Rfl: 11  ???  valproate (DEPAKENE) 250 mg/5 mL syrup, Take 5ml (250mg ) by  G-tube every morning and 15ml (750mg ) every evening, Disp: 1850 mL, Rfl: 0    Current Outpatient Medications:   ???  acetaminophen (TYLENOL) 160 mg/5 mL (5 mL) suspension, 15.6 mL (500 mg total) by G-tube route every four (4) hours as needed for pain., Disp: 1 Bottle, Rfl: 0  ???  albuterol HFA 90 mcg/actuation inhaler, Inhale 2 puffs every six (6) hours as needed for wheezing., Disp: 1 Inhaler, Rfl: 11  ???  arformoterol (BROVANA), Inhale 2 mL (15 mcg total) by nebulization Two (2) times a day., Disp: 120 mL, Rfl: 11  ???  azithromycin (ZITHROMAX) 500 MG tablet, 1 tablet (500 mg total) by G-tube route 3 (three) times a week., Disp: 12 tablet, Rfl: 11  ???  budesonide (PULMICORT) 0.5 mg/2 mL nebulizer solution, Inhale 4 mL (1 mg total) by nebulization Two (2) times a day., Disp: 240 mL, Rfl: 11  ???  budesonide-formoterol (SYMBICORT) 160-4.5 mcg/actuation inhaler, Inhale 2 puffs Two (2) times a day., Disp: 1 Inhaler, Rfl: 11  ???  cetirizine (ZYRTEC) 10 MG tablet, 1 tablet (10 mg total) by G-tube route every evening., Disp: , Rfl: 0  ???  cholecalciferol, vitamin D3, (VITAMIN D3) 2,000 unit cap, 3 capsules (6,000 Units total) by G-tube route daily., Disp: , Rfl: 0  ???  citalopram (CELEXA) 40 MG tablet, 1 tablet (40 mg total) by G-tube route nightly., Disp: 90 tablet, Rfl: 0  ???  clindamycin (CLINDAGEL) 1 % gel, Apply topically Two (2) times a day., Disp: 60 g, Rfl: 6  ???  cyclobenzaprine (FLEXERIL) 5 MG tablet, Take 1 tablet (5 mg total) by mouth Three (3) times a day as needed for muscle spasms., Disp: 15 tablet, Rfl: 0  ???  diclofenac sodium (VOLTAREN) 1 % gel, Apply 2 g topically Four (4) times a day. (Patient taking differently: Apply 2 g topically 4 (four) times a day as needed. ), Disp: 100 g, Rfl: 6  ???  dicyclomine (BENTYL) 10 mg capsule, 10 mg by G-tube route 4 (four) times a day as needed. , Disp: , Rfl:   ???  fluticasone (FLONASE) 50 mcg/actuation nasal spray, 2 sprays by Each Nare route daily. , Disp: , Rfl:   ???  gabapentin (NEURONTIN) 300 MG capsule, 300 mg by G-tube route Three (3) times a day., Disp: , Rfl:   ???  guaiFENesin (MUCINEX) 600 mg 12 hr tablet, Take 1 tablet (600 mg total) by mouth Two (2) times a day. for 7 days, Disp: 14 tablet, Rfl: 0  ???  ibuprofen (ADVIL,MOTRIN) 600 MG tablet, 1 tablet (600 mg total) by G-tube route every six (6) hours as needed for pain., Disp: 30 tablet, Rfl: 1  ???  ipratropium-albuterol (DUO-NEB) 0.5-2.5 mg/3 mL nebulizer, Inhale 3 mL 4 (four) times a day. , Disp: , Rfl:   ???  levothyroxine (SYNTHROID, LEVOTHROID) 75 MCG tablet, 1 tablet (75 mcg total) by G-tube route daily., Disp: 90 tablet, Rfl: 3  ???  lidocaine-prilocaine (EMLA) cream, Apply topically daily as needed. For port access, Disp: 30 g, Rfl: 0  ???  linaclotide (LINZESS) 290 mcg capsule, 1 capsule (290 mcg total) by G-tube route daily., Disp: 30 capsule, Rfl: 0  ???  LORazepam (ATIVAN) 0.5 MG tablet, Take 1 tablet (0.5mg ) twice daily and 1 tablet (0.5mg ) daily as needed for anxiety via G-tube, Disp: 70 tablet, Rfl: 2  ???  minocycline (MINOCIN,DYNACIN) 100 MG capsule, 1 capsule (100 mg total) by G-tube route nightly., Disp: 90 capsule, Rfl: 3  ???  mirtazapine (REMERON) 15 MG tablet, 1 tablet (15 mg total) by G-tube route nightly., Disp: 90 tablet, Rfl: 0  ???  montelukast (SINGULAIR) 10 mg tablet, Take 1 tablet (10 mg total) by mouth nightly., Disp: 30 tablet, Rfl: 11  ???  omeprazole (PRILOSEC) 20 MG capsule, 20 mg Two (2) times a day (30 minutes before a meal). Via G-tube, Disp: , Rfl:   ???  PARI LC D NEBULIZER Misc, Provide 1 device  for use with inhaled medication., Disp: 2 each, Rfl: 11  ???  polyethylene glycol (MIRALAX) 17 gram packet, 17 g by G-tube route daily as needed., Disp: 20 packet, Rfl: 0  ???  promethazine (PHENERGAN) 6.25 mg/5 mL syrup, Take 10 mL (12.5 mg total) by mouth 4 (four) times a day as needed for nausea. for up to 7 days, Disp: 120 mL, Rfl: 0  ???  sodium chloride (NS) 0.9 % Soln, Infuse 10 mL into a venous catheter 4 (four) times a day as needed (port flush)., Disp: 60 vial, Rfl: 0  ???  sodium chloride 3 % nebulizer solution, Inhale 4 mL by nebulization 4 (four) times a day., Disp: 480 mL, Rfl: 1  ???  tobramycin, PF, (TOBI) 300 mg/5 mL nebulizer solution, Inhale 5 mL (300 mg total) by nebulization every twelve (12) hours. Every other month, Disp: 280 mL, Rfl: 5  ???  triamcinolone (KENALOG) 0.1 % cream, Apply topically Two (2) times a day., Disp: 15 g, Rfl: 11  ???  valproate (DEPAKENE) 250 mg/5 mL syrup, Take 5ml (250mg ) by G-tube every morning and 15ml (750mg ) every evening, Disp: 1850 mL, Rfl: 0       VITAL SIGNS:    There were no vitals taken for this visit.    PHYSICAL EXAM:    {GI Exam Consult:[id number]}    DIAGNOSTIC STUDIES:  I have reviewed all pertinent diagnostic studies, including:    GI Procedures:    6/19 EGd:  Findings:       Two benign-appearing, intrinsic moderate stenoses were found 26 cm and        35 cm from the incisors. The narrowest stenosis measured 1.3 cm (inner        diameter), at 26cm from the incisors, at site of prior fistula repair.        The second was a very mild stenosis at GEJ, at 35cm from the incisors.        The stenoses were traversed. A TTS dilator was passed through the scope.        Dilation with a 15-16.5-18 mm balloon dilator was performed to 18 mm.        The first, distal dilation site was examined and showed no change. A TTS        dilator was passed through the scope. Dilation of the proximal stenosis        with a 15-16.5-18 mm balloon dilator was performed to 18 mm. The        dilation site was examined and showed mild mucosal disruption.       There was evidence of an intact gastrostomy with a patent G-tube present in the gastric body. This was characterized by healthy appearing mucosa.       A single umbilicated, nodular lesion measuring 7 mm in diameter was        found in the gastric antrum, consistent with pancreatic rest.       Diffuse moderately scalloped mucosa was found in the entire examined  duodenum. Biopsies for histology were taken with a cold forceps for        evaluation of celiac disease.                                                                                   Impression:        - Benign-appearing esophageal stenoses. Dilated.                     - Intact gastrostomy with a patent G-tube present                      characterized by healthy appearing mucosa.                     - A single lesion consistent with aberrant pancreas was                      found in the stomach.                     - Scalloped mucosa was found in the duodenum, suspicious                      for celiac disease. Biopsied.  Recommendation:    - Await pathology results.                     - Return patient to hospital ward for ongoing care.                     - Resume previous diet.                     - Continue present medications.    EGD 5/19:    LOWER ESOPHAGEAL SPHINCTER (LES):       - Proximal LES border: 37.3 cm from nares.       - Distal LES border: 42.3 cm from nares.       - Pressure Inversion Point: 38.3 cm from nares.       - Basal / Resting LES pressure: 22.8 mmHg (eSleeve, normal range 13-43        mmHg).       - Residual LES pressure (Integrated Relaxation Pressure): 17.7 mmHg        (integrated relaxation, normal < 15 mmHg).       ESOPHAGEAL BODY (motility, peristaltic contractions):       - Number of swallows evaluated: 10.       - Peristaltic: 0 %.       - Normal (effective esophageal peristalsis): 0 %.       - Hypotensive: 0 %.       - Hypertensive: 0 %.       - Simultaneous: 100 %.       - Failed: 0 %.       UPPER ESOPHAGEAL SPHINCTER (UES):       - UES location: 18.5 cm from nares.       - Basal / Resting UES pressure: 88.2 mmHg (mean, normal range 34-104  mmHg).       - Residual UES pressure: 2 mmHg (mean, normal < 12 mmHg).                                                                                   Impression:        - Manometry results are diagnostic of Type II achalasia.  Recommendation:    - Return to ordering provider.                                                                                   Procedure Code(s): --- Professional ---      EGD 02/22/2018:  Findings:  ??????????One benign-appearing, intrinsic moderate (circumferential scarring or   ??????????stenosis; an endoscope may pass) stenosis was found 25 to 27 cm from the   ??????????incisors at suspected prior fistula repair site. This stenosis measured   ??????????2 cm (in length). The stenosis was traversed. A TTS dilator was passed   ??????????through the scope. Dilation with a 15-16.5-18 mm balloon dilator was   ??????????performed to 18 mm. Of note, patient had difficult respiration (poor CO2   ??????????return) while dilating this area with the 18mm balloon - suspect this is   ??????????due to tracheal obstruction due to tracheomalacia. This improved very   ??????????quickly as soon as the balloon was deflated.  ??????????One benign-appearing, intrinsic mild stenosis was found at the   ??????????gastroesophageal junction (35 cm from the incisors). This stenosis   ??????????measured 1 cm (in length). The stenosis was traversed. A TTS dilator was   ??????????passed through the scope. Dilation with a 15-16.5-18 mm balloon and an   ??????????18-19-20 mm balloon dilator was performed to 20 mm. The dilation site   ??????????was examined and showed mild mucosal disruption.  ??????????There was evidence of an intact gastrostomy with a patent G-tube present   ??????????on the anterior wall of the stomach. This was characterized by healthy   ??????????appearing mucosa.  ??????????Evidence of a 180 degree fundoplication was found in the cardia and in   ??????????the gastric fundus. The wrap appeared loose. This was traversed. ??????????Two smooth, umbilicated lesions measuring 4 mm in diameter were found on   ??????????the posterior wall of the stomach and on the posterior wall of the   ??????????gastric antrum. Similar to prior. Stomach was otherwise normal.  ??????????The examined duodenum was normal.  ??  Impression: ??????????????- Benign-appearing esophageal stenosis at GEJ. Dilated.  ??????????????????????????????????????- Benign-appearing esophageal stenosis at suspected prior   ??????????????????????????????????????fistula repair site. Dilated.  ??????????????????????????????????????- Intact gastrostomy with a patent G-tube present   ??????????????????????????????????????characterized by healthy appearing mucosa.  ??????????????????????????????????????- Two lesions suspicious for aberrant pancreas were found   ??????????????????????????????????????in the stomach.  ??????????????????????????????????????- Normal examined duodenum.  ??????????????????????????????????????- No specimens collected.  Recommendation: ??????- Return patient to hospital ward for ongoing care.  ??????????????????????????????????????- Resume previous diet.  ??????????????????????????????????????- Continue present medications.  ??????????????????????????????????????-  Repeat upper endoscopy for repeat dilation per Dr.   ??????????????????????????????????????[place]. (3 months at main [facility])  ??????????????????????????????????????- RECOMMEND using a reinforced endotracheal tube per   ??????????????????????????????????????tracheostomy for future dilation of proximal esophagus to   ??????????????????????????????????????avoid tracheal obstruction with endoscopic ballon   ??????????????????????????????????????inflation.                                                           Radiographic studies:    5/19 barium swallow:  FINDINGS:   The patient was given oral contrast to swallow which the patient did without difficulty. A tracheostomy device is seen.    There is narrowing of the distal third of the esophagus. The esophageal mucosa was otherwise normal in appearance, with no mucosal lesions, ulcerations, or polypoid filling defects. ??There is esophageal dysmotility with delayed clearance of esophageal contents.    There was no evidence of a hiatal hernia, esophageal ring, web or achalasia. Due to delayed clearance of esophageal contents and residual barium within the esophagus, this patient cannot be evaluated for gastroesophageal reflux.     The patient was not given a 12.5 mm barium tablet demonstrated delayed clearance of esophageal contents with distal esophageal stricture..     Evaluation of the cervical esophagus with videofluoroscopy and rapid filming was unremarkable.    The remainder of the partially included chest, including the cardiomediastinal silhouette, lungs, bones and soft tissues were normal.      Impression     1.Smoothly marginated narrowing of the distal third of the esophagus.  2.Delayed clearance of esophageal contents.        Timed barium swallow 5/19:  FINDINGS:   A tracheostomy device is seen. Surgical clips are seen in the left upper quadrant which may be related to patient's prior fundoplication surgeries. No acute osseous bodies are seen.    There is no significant change in the amount of contrast visualized in the esophagus on the 1, 2, and 5 minute images. There is relative narrowing of the distal esophagus, better visualized on the comparison barium swallow.  ???? ??       Impression     1.Delayed clearance of esophageal contents with no significant change in barium quantity on the 1, 2, and 5 minute images.  2.Relative narrowing of the distal esophagus, better visualized on the comparison barium swallow study.          Laboratory results    No results displayed because visit has over 200 results.        {IP GI Lab Results:[id number]}

## 2018-07-03 NOTE — Unmapped (Signed)
Patient was seen at [facility] minutes after message sent to me. He had his phenergan refilled there.  No further needs.

## 2018-07-03 NOTE — Unmapped (Signed)
Pt called in c/o nauseate. She is requesting for Dr. Garner Nash to call in a prescription for promethazine (PHENERGAN) 6.25 mg/5 mL syrup . Please advise.

## 2018-07-06 MED ORDER — PROMETHAZINE 6.25 MG/5 ML ORAL SYRUP
Freq: Three times a day (TID) | ORAL | 2 refills | 0 days | Status: CP | PRN
Start: 2018-07-06 — End: 2018-08-19

## 2018-07-09 NOTE — Unmapped (Deleted)
Transitions of Care Note    Subjective      Admission Date: 06/27/18  Discharge Date: 06/30/18  Discharge Hospital/Unit: [facility]  The [facility] and sent to his home.    Post discharge interactive communication via telephone was not*** made with patient on *** and I have reviewed the information from that communication.    Today's (07/08/2018) face to face interactive visit is within 14 days days of discharge.    Chief Compliant: Patient is here in follow-up to their recent hospitalization and to discuss the following medical problems: ***    HISTORY OF PRESENT ILLNESS:    Cameron Baker is a 27 y.o. male who presents for transitions hospital follow up.    Hospitalized for: 1.5 days of progressive chest tightness, wheezing, cough thought to be secondary to acute asthma exacerbation     Interval update: ***    Pt reported factors contributing to admission or ED visit: { :42772}    Patient uses pill box { :42817}    PHQ-9 PHQ-9 TOTAL SCORE   06/24/2018 1   12/17/2017 3   11/05/2017 4   07/31/2017 4   05/15/2017 8   09/12/2016 4   08/22/2016 6       The remaining 10 systems reviewed were negative.    {Seen by Pharmacist and/or Social Worker:42773}    I have reviewed the patients discharge summary for this hospitalization.  I have also reviewed the problem list, allergies, family and social history and updated them as needed.         Objective     [name]  vitals were not taken for this visit.     General: appears well and no distress   Head: Normocephalic, atraumatic  ENT: Pearly TM's bilateral, pharynx moist and clear, no oral exudates, good dentition, nose normal.   Eyes: conjunctiva normal, no discharge.  Neck: no thyroid enlargement or masses  Lymphatic: No lymphadenopathy palpated   Heart:: Normal heart rate and rhythm; no murmurs, rubs, or gallops.   Lungs: Normal breath sounds, no respiratory distress or wheezing  Chest: no chest tenderness.   Abdomen: Bowel sounds normal, soft abdomen without mass, tenderness, rebound or guarding.   Skin: Warm, dry, no erythema, no rash.   Musculoskeletal: Good range of motion  Back: no tenderness  Extremities: intact distal pulses, no edema, no tenderness  Neurologic: Alert & oriented x 3, normal motor function, normal sensory function, no focal deficits  Psychiatric: Pleasant, cooperative, good eye contact, appropriate thought processes    Labs:  I have reviewed the labs from this hospitalization and the ones on the day of discharge and have followed up on any pending labs at the time of discharge.  See Epic Labs section for details.       Assessment/Plan:     Problem List Items Addressed This Visit     None      ***  -If symptoms persist past discontinuation of prednisone, consider extending course to taper  -Follow up CF sputum culture sent on day of discharge  -Consider outpatient evaluation of environmental allergens, asthma triggers  -Can re-pursue the idea of genetics evaluation, given persistently elevated lactate ***    Medications prescribed or ordered upon discharge were reviewed today and reconciled with the most recent outpatient medication list.      The following medications changes were made: ***    Biggest Risk for Readmission: ***    Items to  follow-up on next visit: ***    Medical decision making was of { :42768} complexity.    Necessary referral have been made.  See Visit Summary for details of referrals.    I will forward my plan and recommendations to patients PCP, [name], MD    Follow-up with PCP or another provider has been scheduled:   Future Appointments   Date Time Provider Department Center   07/09/2018 10:00 AM [facility]   07/09/2018 10:40 AM Pollyann Glen, MD [facility]   08/10/2018  8:45 AM [facility]   08/10/2018  9:00 AM Truett Mainland, MD [facility]   08/25/2018  1:00 PM Rosezetta Schlatter, PMHNP [facility]ST[facility]   11/01/2018 10:20 AM Liane Comber, MD [id number]        Total time spent face to face with the patient was 40 minutes of  which *** minutes were spent counseling/coordinating care regarding his: recent hospitalization and the following conditions: ***         [facility]  [place] at [facility]  [address], [place], [place] [address] ??? Telephone [phone] ??? Fax [phone]  [email or web address]

## 2018-07-14 NOTE — Unmapped (Signed)
Spoke with Susie at [facility]. She phoned to inquire about new referral for once a month port flush for this patient.   Called  [facility] that was home health identified by case manager on 7/24 and verified they are able to do his port flushes and that he is an established patient with them.  [facility] verified the above.  Notified Susie with [facility] and she will cancel the referral with them.

## 2018-07-14 NOTE — Unmapped (Signed)
Placed order for home nursing for monthly port flushes.

## 2018-07-16 ENCOUNTER — Encounter: Admit: 2018-07-16 | Discharge: 2018-07-16 | Disposition: A | Discharge status: Home / Self Care | Payer: MEDICARE

## 2018-07-16 DIAGNOSIS — R0602 Shortness of breath: Principal | ICD-10-CM

## 2018-07-16 LAB — COMPREHENSIVE METABOLIC PANEL
ALKALINE PHOSPHATASE: 51 U/L (ref 38–126)
ALT (SGPT): 77 U/L — ABNORMAL HIGH (ref 19–72)
ANION GAP: 14 mmol/L (ref 9–15)
BILIRUBIN TOTAL: 0.3 mg/dL (ref 0.0–1.2)
BLOOD UREA NITROGEN: 6 mg/dL — ABNORMAL LOW (ref 7–21)
BUN / CREAT RATIO: 8
CALCIUM: 9.6 mg/dL (ref 8.5–10.2)
CHLORIDE: 104 mmol/L (ref 98–107)
CO2: 25 mmol/L (ref 22.0–30.0)
CREATININE: 0.75 mg/dL (ref 0.70–1.30)
EGFR CKD-EPI AA MALE: >90 mL/min/{1.73_m2} (ref >=60)
EGFR CKD-EPI NON-AA MALE: >90 mL/min/{1.73_m2} (ref >=60)
GLUCOSE RANDOM: 131 mg/dL (ref 65–179)
POTASSIUM: 3.8 mmol/L (ref 3.5–5.0)
SODIUM: 143 mmol/L (ref 135–145)

## 2018-07-16 LAB — BLOOD GAS CRITICAL CARE PANEL, VENOUS
CALCIUM IONIZED VENOUS (MG/DL): 4.63 mg/dL (ref 4.40–5.40)
GLUCOSE WHOLE BLOOD: 141 mg/dL
HCO3 VENOUS: 25 mmol/L (ref 22–27)
HEMOGLOBIN BLOOD GAS: 12.8 g/dL — ABNORMAL LOW (ref 13.50–17.50)
LACTATE BLOOD VENOUS: 2.1 mmol/L — ABNORMAL HIGH (ref 0.5–1.8)
O2 SATURATION VENOUS: 70.5 % (ref 40.0–85.0)
PCO2 VENOUS: 44 mmHg (ref 40–60)
PO2 VENOUS: 41 mmHg (ref 30–55)
POTASSIUM WHOLE BLOOD: 3.5 mmol/L (ref 3.4–4.6)
SODIUM WHOLE BLOOD: 145 mmol/L (ref 135–145)

## 2018-07-16 LAB — CBC W/ AUTO DIFF
BASOPHILS RELATIVE PERCENT: 0.4 %
EOSINOPHILS ABSOLUTE COUNT: 0.2 10*9/L (ref 0.0–0.4)
EOSINOPHILS RELATIVE PERCENT: 2.3 %
HEMATOCRIT: 38.2 % — ABNORMAL LOW (ref 41.0–53.0)
HEMOGLOBIN: 12.6 g/dL — ABNORMAL LOW (ref 13.5–17.5)
LARGE UNSTAINED CELLS: 2 % (ref 0–4)
LYMPHOCYTES RELATIVE PERCENT: 37 %
MEAN CORPUSCULAR HEMOGLOBIN CONC: 33 g/dL (ref 31.0–37.0)
MEAN CORPUSCULAR HEMOGLOBIN: 24.7 pg — ABNORMAL LOW (ref 26.0–34.0)
MEAN CORPUSCULAR VOLUME: 74.7 fL — ABNORMAL LOW (ref 80.0–100.0)
MEAN PLATELET VOLUME: 7.1 fL (ref 7.0–10.0)
MONOCYTES ABSOLUTE COUNT: 0.4 10*9/L (ref 0.2–0.8)
MONOCYTES RELATIVE PERCENT: 5 %
NEUTROPHILS ABSOLUTE COUNT: 3.7 10*9/L (ref 2.0–7.5)
NEUTROPHILS RELATIVE PERCENT: 53.3 %
PLATELET COUNT: 357 10*9/L (ref 150–440)
RED BLOOD CELL COUNT: 5.11 10*12/L (ref 4.50–5.90)
RED CELL DISTRIBUTION WIDTH: 15.6 % — ABNORMAL HIGH (ref 12.0–15.0)
WBC ADJUSTED: 7 10*9/L (ref 4.5–11.0)

## 2018-07-16 LAB — HCO3 VENOUS: Bicarbonate:SCnc:Pt:BldA:Qn:: 25

## 2018-07-16 LAB — LARGE UNSTAINED CELLS: Lab: 2

## 2018-07-16 LAB — ALBUMIN: Albumin:MCnc:Pt:Ser/Plas:Qn:: 4.5

## 2018-07-16 LAB — LACTATE BLOOD VENOUS: Lactate:SCnc:Pt:BldV:Qn:: 1.6

## 2018-07-16 MED ORDER — PREDNISONE 10 MG TABLET
ORAL_TABLET | 0 refills | 0 days | Status: CP
Start: 2018-07-16 — End: 2018-07-29

## 2018-07-16 NOTE — Unmapped (Signed)
PT via EMS complaining of chronic trach and thick secretions, albuterol given by EMS

## 2018-07-16 NOTE — Unmapped (Signed)
[facility]  Emergency Department Provider Note    ED Clinical Impression     Final diagnoses:   Shortness of breath (Primary)       Initial Impression, ED Course, Assessment and Plan     BP 122/62  - Pulse 109  - Temp 36.3 ??C (97.3 ??F) (Oral)  - Resp 19  - SpO2 96%     Impression:  Cameron Baker is a 27 y.o. male with complicated past medical history including VATER syndrome, chronic tracheobronchitis, bipolar, chronic esophageal strictures and asthma who presents for worsening shortness of breath that began today.  No presentation patient is alert and oriented, chronically ill-appearing but in no distress.  Vital signs are significant for mild tachycardia to 109.  Patient speaking complete sentences, has coarse lung sounds bilaterally with small amount of wheezing but very good air movement and lung expansion.  No tachypnea or accessory muscle use.  Overall patient is generally very well appearing.  Abdomen soft nontender, nontender without peritoneal signs.  Presentation likely due to asthma exacerbation versus exacerbation of his chronic underlying lung disease.  Will treat with duo nebs, Solu-Medrol and mag.  Will get a chest x-ray to evaluate for consolidation.  Patient responds well to treatments and is feeling better he can follow-up with his pulmonology clinic.    ED Course:    On reevaluation patient continues to be very well-appearing, coarse lung sounds but good air movement.  Labs were significant for mildly elevated lactate at 2, he received some fluids and repeat lactate downtrending to 1.6.  Labs otherwise reassuring including VBG. States that he is feeling better.  We will discharge him with a steroid taper and instructions to follow-up with his pulmonology clinic.  He will call the clinic tomorrow morning to speak about today's visit and for follow-up.  Return precautions were discussed.    Additional Medical Decision Making     I reviewed the patient's past medical history. and I independently visualized the radiology images  I discussed the case with the Attending,      Any labs and radiology results that were available during my care of the patient were independently reviewed by me and considered in my medical decision making.    Portions of this record have been created using Scientist, clinical (histocompatibility and immunogenetics). Dictation errors have been sought, but may not have been identified and corrected.  ____________________________________________    I have reviewed the triage vital signs and the nursing notes.     History     Chief Complaint  Shortness of Breath      HPI   Cameron Baker is a 27 y.o. male with complicated past medical history including VATER syndrome, chronic tracheobronchitis, bipolar, chronic esophageal strictures and asthma who presents for worsening shortness of breath that began today.  He has had several weeks of intermittent shortness of breath but it was slightly worse last night, he had difficulty sleeping.  Comes in primarily because he thinks he would benefit from a bronc due to the dried up secretions that he feels like he has in his lungs.  Reports compliance with all of his medications, has not had to change any settings on his trilogy, no increased O2 requirement.  He was recently admitted for an asthma exacerbation and discharged on a burst of steroids, he is finished his burst but has continued to have intermittent shortness of breath.  No fevers, nausea or vomiting, has chronic cough that may be slightly worse.  No chest pain or diarrhea.      Past Medical History:   Diagnosis Date   ??? ADHD    ??? Asthma    ??? Bipolar 1 disorder (CMS-HCC)    ??? Chronic tracheobronchitis (CMS-HCC)    ??? Constipation    ??? De Quervain's tenosynovitis    ??? Dysphagia    ??? Esophageal dysmotility    ??? Esophageal stricture    ??? GERD (gastroesophageal reflux disease)    ??? hypothyroid    ??? Seizures (CMS-HCC)    ??? Tracheobronchomalacia    ??? VATER syndrome    ??? Ventilator dependence (CMS-HCC) Patient Active Problem List   Diagnosis   ??? Asthma   ??? Attention deficit disorder   ??? Slow transit constipation   ??? Dysphagia   ??? Esophageal reflux   ??? Hypothyroidism   ??? Class 1 obesity in adult   ??? Seizures (CMS-HCC)   ??? Gastrostomy status (CMS-HCC)   ??? Stricture and stenosis of esophagus   ??? Post traumatic stress disorder (PTSD)   ??? Mood disorder (CMS-HCC)   ??? Personality disorder (CMS-HCC)   ??? Chronic tracheobronchitis (CMS-HCC)   ??? Congenital tracheomalacia   ??? Tracheostomy dependence (CMS-HCC)   ??? VATER syndrome   ??? Muscle spasms of both lower extremities   ??? Pain and swelling of right wrist   ??? Hypogammaglobulinemia (CMS-HCC)   ??? Triangular fibrocartilage complex injury, left, subsequent encounter   ??? Enthesopathy of right hip region   ??? Dyspnea   ??? Acute asthma exacerbation   ??? Acute respiratory distress    ??? Nausea       Past Surgical History:   Procedure Laterality Date   ??? ANAL DILATION  01/21/2006   ??? ESOPHAGOGASTRODUODENOSCOPY      multiple   ??? HIP SURGERY Right 01/19/2017    shaved   ??? IR INSERT PORT AGE GREATER THAN 5 YRS  06/22/2018    IR INSERT PORT AGE GREATER THAN 5 YRS 06/22/2018 Andres Labrum, MD IMG VIR [facility]   ??? LATERAL RECTUS RECESSION  11/15/2003   ??? NISSEN FUNDOPLICATION  2001    x 2 redos   ??? PORTACATH PLACEMENT  01/21/2006    taken out same year   ??? PR ESOPHAGEAL MOTILITY STUDY, MANOMETRY N/A 04/23/2018    Procedure: ESOPHAGEAL MOTILITY STUDY W/INT & REP;  Surgeon: Nurse-Based Giproc;  [facility];  Service: Gastroenterology   ??? PR UP GI ENDOSCOPY,BALL DIL,30MM N/A 02/22/2018    Procedure: UGI ENDO; W/BALLOON DILAT ESOPHAGUS (<30MM DIAM);  Surgeon: Zetta Bills, MD;  Location: [facility];  Service: Gastroenterology   ??? PR UP GI ENDOSCOPY,BALL DIL,30MM N/A 05/20/2018    Procedure: UGI ENDO; W/BALLOON DILAT ESOPHAGUS (<30MM DIAM);  Surgeon: Rona Ravens, MD;  [facility]: [facility];  Service: Gastroenterology   ??? REMOVAL VENOUS ACCESS PORT [facility] HISTORICAL RESULT)  11/03/2006   ??? STRABISMUS SURGERY Right    ??? TRACHEOSTOMY  1994   ??? WISDOM TOOTH EXTRACTION  04/2012   ??? WRIST SURGERY      3 times.          Current Facility-Administered Medications:   ???  heparin, porcine (PF) 100 unit/mL injection, , , ,     Current Outpatient Medications:   ???  acetaminophen (TYLENOL) 160 mg/5 mL (5 mL) suspension, 15.6 mL (500 mg total) by G-tube route every four (4) hours as needed for pain., Disp: 1 Bottle, Rfl: 0  ???  albuterol HFA 90 mcg/actuation inhaler, Inhale 2 puffs every six (6) hours as needed for wheezing., Disp: 1 Inhaler, Rfl: 11  ???  arformoterol (BROVANA), Inhale 2 mL (15 mcg total) by nebulization Two (2) times a day., Disp: 120 mL, Rfl: 11  ???  azithromycin (ZITHROMAX) 500 MG tablet, 1 tablet (500 mg total) by G-tube route 3 (three) times a week., Disp: 12 tablet, Rfl: 11  ???  budesonide (PULMICORT) 0.5 mg/2 mL nebulizer solution, Inhale 4 mL (1 mg total) by nebulization Two (2) times a day., Disp: 240 mL, Rfl: 11  ???  budesonide-formoterol (SYMBICORT) 160-4.5 mcg/actuation inhaler, Inhale 2 puffs Two (2) times a day., Disp: 1 Inhaler, Rfl: 11  ???  cetirizine (ZYRTEC) 10 MG tablet, 1 tablet (10 mg total) by G-tube route every evening., Disp: , Rfl: 0  ???  cholecalciferol, vitamin D3, (VITAMIN D3) 2,000 unit cap, 3 capsules (6,000 Units total) by G-tube route daily., Disp: , Rfl: 0  ???  citalopram (CELEXA) 40 MG tablet, 1 tablet (40 mg total) by G-tube route nightly., Disp: 90 tablet, Rfl: 0  ???  clindamycin (CLINDAGEL) 1 % gel, Apply topically Two (2) times a day., Disp: 60 g, Rfl: 6  ???  cyclobenzaprine (FLEXERIL) 5 MG tablet, Take 1 tablet (5 mg total) by mouth Three (3) times a day as needed for muscle spasms., Disp: 15 tablet, Rfl: 0  ???  diclofenac sodium (VOLTAREN) 1 % gel, Apply 2 g topically Four (4) times a day. (Patient taking differently: Apply 2 g topically 4 (four) times a day as needed. ), Disp: 100 g, Rfl: 6  ???  dicyclomine (BENTYL) 10 mg capsule, 10 mg by G-tube route 4 (four) times a day as needed. , Disp: , Rfl:   ???  fluticasone (FLONASE) 50 mcg/actuation nasal spray, 2 sprays by Each Nare route daily. , Disp: , Rfl:   ???  gabapentin (NEURONTIN) 300 MG capsule, 300 mg by G-tube route Three (3) times a day., Disp: , Rfl:   ???  ibuprofen (ADVIL,MOTRIN) 600 MG tablet, 1 tablet (600 mg total) by G-tube route every six (6) hours as needed for pain., Disp: 30 tablet, Rfl: 1  ???  ipratropium-albuterol (DUO-NEB) 0.5-2.5 mg/3 mL nebulizer, Inhale 3 mL 4 (four) times a day. , Disp: , Rfl:   ???  levothyroxine (SYNTHROID, LEVOTHROID) 75 MCG tablet, 1 tablet (75 mcg total) by G-tube route daily., Disp: 90 tablet, Rfl: 3  ???  lidocaine-prilocaine (EMLA) cream, APPLY TOPICALLY DAILY AS NEEDED FOR PORT ACCESS, Disp: 30 g, Rfl: 0  ???  linaclotide (LINZESS) 290 mcg capsule, 1 capsule (290 mcg total) by G-tube route daily., Disp: 30 capsule, Rfl: 0  ???  linezolid (ZYVOX) 600 mg tablet, GIVE 1 TABLET (600MG  TOTAL) VIA G-TUBE EVERY TWELVE HOURS FOR 5 DAYS, Disp: 10 tablet, Rfl: 0  ???  LORazepam (ATIVAN) 0.5 MG tablet, Take 1 tablet (0.5mg ) twice daily and 1 tablet (0.5mg ) daily as needed for anxiety via G-tube, Disp: 70 tablet, Rfl: 2  ???  minocycline (MINOCIN,DYNACIN) 100 MG capsule, 1 capsule (100 mg total) by G-tube route nightly., Disp: 90 capsule, Rfl: 3  ???  mirtazapine (REMERON) 15 MG tablet, 1 tablet (15 mg total) by G-tube route nightly., Disp: 90 tablet, Rfl: 0  ???  montelukast (SINGULAIR) 10 mg tablet, Take 1 tablet (10 mg total) by mouth nightly., Disp: 30 tablet, Rfl: 11  ???  omeprazole (PRILOSEC) 20 MG capsule, 20 mg Two (2) times a day (30 minutes before a  meal). Via G-tube, Disp: , Rfl:   ???  PARI LC D NEBULIZER Misc, Provide 1 device  for use with inhaled medication., Disp: 2 each, Rfl: 11  ???  polyethylene glycol (MIRALAX) 17 gram packet, 17 g by G-tube route daily as needed., Disp: 20 packet, Rfl: 0  ???  predniSONE (DELTASONE) 10 MG tablet, 40 mg x 4 days, 30 mg x 4 days, 20 mg x 4 days, 10 mg x 4 days, Disp: 40 tablet, Rfl: 0  ???  promethazine (PHENERGAN) 6.25 mg/5 mL syrup, Take 10 mL (12.5 mg total) by mouth Three (3) times a day as needed for nausea., Disp: 900 mL, Rfl: 2  ???  sodium chloride (NS) 0.9 % injection, INFUSE INTO VENOUS CATHETER 4 TIMES DAILY AS NEEDED (PORT FLUSH), Disp: 160 mL, Rfl: 0  ???  sodium chloride (NS) 0.9 % Soln, Infuse 10 mL into a venous catheter 4 (four) times a day as needed (port flush)., Disp: 60 vial, Rfl: 0  ???  sodium chloride 3 % nebulizer solution, Inhale 4 mL by nebulization 4 (four) times a day., Disp: 480 mL, Rfl: 1  ???  tobramycin, PF, (TOBI) 300 mg/5 mL nebulizer solution, INHALE (300MG ) VIA NEBULIZER EVERY TWELVE HOURS EVERY OTHER MONTH, Disp: 280 mL, Rfl: 5  ???  triamcinolone (KENALOG) 0.1 % cream, Apply topically Two (2) times a day., Disp: 15 g, Rfl: 11  ???  valproate (DEPAKENE) 250 mg/5 mL syrup, Take 5ml (250mg ) by G-tube every morning and 15ml (750mg ) every evening, Disp: 1850 mL, Rfl: 0    Allergies  Xopenex [levalbuterol hcl]; Allopurinol analogues; and Versed [midazolam]    Family History   Adopted: Yes       Social History  Social History     Tobacco Use   ??? Smoking status: Never Smoker   ??? Smokeless tobacco: Never Used   Substance Use Topics   ??? Alcohol use: Yes     Alcohol/week: 2.0 standard drinks     Types: 2 Cans of beer per week     Comment: on weekends sometimes   ??? Drug use: No       REVIEW OF SYSTEMS: A 10 point review of systems was performed and is otherwise negative except for pertinent positives and negatives as stated above.    Physical Exam     ED Triage Vitals   Enc Vitals Group      BP       Pulse       SpO2 Pulse       Resp       Temp       Temp src       SpO2       Weight       Height       Head Circumference       Peak Flow       Pain Score       Pain Loc       Pain Edu?       Excl. in GC?        Constitutional: Chronically ill appearing and Alert and oriented  Eyes: Conjunctivae are normal. ENT       Head: Normocephalic.         Nose: No congestion.       Mouth/Throat: Mucous membranes are moist.         Neck: No stridor.   Cardiovascular:  Normal rate, regular rhythm.   Respiratory: Coarse breath  sounds bilaterally, Wheezing, Speaking in complete sentences and Normal WOB, speaking in complete sentences  Gastrointestinal:  Soft, non-tender, non distended without peritoneal signs  Back: No gross deformity  Genitourinary: Deferred  Musculoskeletal:  No gross deformities  Neurologic: Normal speech and language; no gross neurological deficits  Skin: Skin is warm, dry and intact.    Psychiatric: Mood and affect are normal. Speech and behavior are normal.     EKG         Radiology     XR Chest Portable   Preliminary Result      No acute airspace disease.          I independently visualized these images.               Paschal Dopp, MD  Resident  07/16/18 [phone]

## 2018-07-16 NOTE — Unmapped (Signed)
Bed: 15-B  Expected date:   Expected time:   Means of arrival:   Comments:  ems

## 2018-07-16 NOTE — Unmapped (Signed)
Magnesium rate increased from 25mg /hr to 50mg /hr with permission from provider

## 2018-07-16 NOTE — Unmapped (Signed)
I received a page notifying me that the patient had called to speak with the on-call physician for the [facility].  I promptly returned this page; however, the call went to voicemail.  I left a message for the patient.

## 2018-07-16 NOTE — Unmapped (Signed)
Pt rounding completed. Patient is in room resting. Reviewed with patient plan of care, patient belongings and call bell within reach. Patients bed in low position and locked. Will continue to monitor patient and reassess.

## 2018-07-16 NOTE — Unmapped (Signed)
Pt rounding completed. Patient is in room resting. Reviewed with patient plan of care, patient belongings and call bell within reach. Patients bed in low position and locked. Will continue to monitor patient and reassess.     Pt is resting with pulse ox and BP monitor on. Pt is alert and oriented. Pt states he started to have some wheezing and came to the ED. Pt states he has been here before. Pt states he has chronic right rib pain, secondary to broken ribs and the pain is worse when he coughs/

## 2018-07-23 ENCOUNTER — Emergency Department
Admit: 2018-07-23 | Discharge: 2018-07-24 | Disposition: A | Discharge status: Home / Self Care | Payer: MEDICARE | Attending: Emergency Medicine

## 2018-07-23 ENCOUNTER — Encounter: Admit: 2018-07-23 | Discharge: 2018-07-24 | Payer: MEDICARE | Attending: Family Medicine | Primary: Family Medicine

## 2018-07-23 ENCOUNTER — Encounter
Admit: 2018-07-23 | Discharge: 2018-07-24 | Disposition: A | Discharge status: Home / Self Care | Payer: MEDICARE | Attending: Emergency Medicine

## 2018-07-23 DIAGNOSIS — Q8789 Other specified congenital malformation syndromes, not elsewhere classified: Secondary | ICD-10-CM

## 2018-07-23 DIAGNOSIS — R1310 Dysphagia, unspecified: Secondary | ICD-10-CM

## 2018-07-23 DIAGNOSIS — L03114 Cellulitis of left upper limb: Principal | ICD-10-CM

## 2018-07-23 DIAGNOSIS — N200 Calculus of kidney: Secondary | ICD-10-CM

## 2018-07-23 DIAGNOSIS — Z931 Gastrostomy status: Secondary | ICD-10-CM

## 2018-07-23 DIAGNOSIS — Q320 Congenital tracheomalacia: Secondary | ICD-10-CM

## 2018-07-23 DIAGNOSIS — M62838 Other muscle spasm: Secondary | ICD-10-CM

## 2018-07-23 DIAGNOSIS — M76891 Other specified enthesopathies of right lower limb, excluding foot: Secondary | ICD-10-CM

## 2018-07-23 DIAGNOSIS — L700 Acne vulgaris: Principal | ICD-10-CM

## 2018-07-23 DIAGNOSIS — E039 Hypothyroidism, unspecified: Secondary | ICD-10-CM

## 2018-07-23 DIAGNOSIS — Z930 Tracheostomy status: Secondary | ICD-10-CM

## 2018-07-23 DIAGNOSIS — M79643 Pain in unspecified hand: Secondary | ICD-10-CM

## 2018-07-23 DIAGNOSIS — L989 Disorder of the skin and subcutaneous tissue, unspecified: Secondary | ICD-10-CM

## 2018-07-23 DIAGNOSIS — K210 Gastro-esophageal reflux disease with esophagitis: Secondary | ICD-10-CM

## 2018-07-23 LAB — COMPREHENSIVE METABOLIC PANEL
ALBUMIN: 4.6 g/dL (ref 3.5–5.0)
ALKALINE PHOSPHATASE: 63 U/L (ref 38–126)
AST (SGOT): 37 U/L (ref 19–55)
BLOOD UREA NITROGEN: 11 mg/dL (ref 7–21)
BUN / CREAT RATIO: 15
CALCIUM: 9.8 mg/dL (ref 8.5–10.2)
CHLORIDE: 100 mmol/L (ref 98–107)
CO2: 28 mmol/L (ref 22.0–30.0)
CREATININE: 0.75 mg/dL (ref 0.70–1.30)
EGFR CKD-EPI AA MALE: >90 mL/min/{1.73_m2} (ref >=60)
EGFR CKD-EPI NON-AA MALE: >90 mL/min/{1.73_m2} (ref >=60)
GLUCOSE RANDOM: 110 mg/dL (ref 65–179)
POTASSIUM: 4.3 mmol/L (ref 3.5–5.0)
PROTEIN TOTAL: 7.5 g/dL (ref 6.5–8.3)
SODIUM: 136 mmol/L (ref 135–145)

## 2018-07-23 LAB — CBC W/ AUTO DIFF
BASOPHILS ABSOLUTE COUNT: 0 10*9/L (ref 0.0–0.1)
BASOPHILS RELATIVE PERCENT: 0.3 %
EOSINOPHILS ABSOLUTE COUNT: 0.2 10*9/L (ref 0.0–0.4)
EOSINOPHILS RELATIVE PERCENT: 1.3 %
HEMATOCRIT: 39.5 % — ABNORMAL LOW (ref 41.0–53.0)
HEMOGLOBIN: 12.9 g/dL — ABNORMAL LOW (ref 13.5–17.5)
LARGE UNSTAINED CELLS: 2 % (ref 0–4)
LYMPHOCYTES ABSOLUTE COUNT: 2.4 10*9/L (ref 1.5–5.0)
LYMPHOCYTES RELATIVE PERCENT: 18.9 %
MEAN CORPUSCULAR HEMOGLOBIN CONC: 32.5 g/dL (ref 31.0–37.0)
MEAN CORPUSCULAR HEMOGLOBIN: 24.2 pg — ABNORMAL LOW (ref 26.0–34.0)
MEAN CORPUSCULAR VOLUME: 74.4 fL — ABNORMAL LOW (ref 80.0–100.0)
MEAN PLATELET VOLUME: 6.9 fL — ABNORMAL LOW (ref 7.0–10.0)
MONOCYTES ABSOLUTE COUNT: 0.9 10*9/L — ABNORMAL HIGH (ref 0.2–0.8)
NEUTROPHILS ABSOLUTE COUNT: 9 10*9/L — ABNORMAL HIGH (ref 2.0–7.5)
NEUTROPHILS RELATIVE PERCENT: 70.9 %
PLATELET COUNT: 489 10*9/L — ABNORMAL HIGH (ref 150–440)
RED BLOOD CELL COUNT: 5.32 10*12/L (ref 4.50–5.90)
WBC ADJUSTED: 12.7 10*9/L — ABNORMAL HIGH (ref 4.5–11.0)

## 2018-07-23 LAB — MAGNESIUM: Magnesium:MCnc:Pt:Ser/Plas:Qn:: 2

## 2018-07-23 LAB — SMEAR REVIEW

## 2018-07-23 LAB — MEAN PLATELET VOLUME: Lab: 6.9 — ABNORMAL LOW

## 2018-07-23 LAB — CREATININE: Creatinine:MCnc:Pt:Ser/Plas:Qn:: 0.75

## 2018-07-23 MED ORDER — IBUPROFEN 600 MG TABLET
ORAL_TABLET | Freq: Four times a day (QID) | GASTROSTOMY | 1 refills | 0.00000 days | Status: CP | PRN
Start: 2018-07-23 — End: ?

## 2018-07-23 MED ORDER — GABAPENTIN 300 MG CAPSULE
ORAL_CAPSULE | Freq: Three times a day (TID) | GASTROSTOMY | 3 refills | 0 days | Status: CP
Start: 2018-07-23 — End: ?

## 2018-07-23 MED ORDER — CLINDAMYCIN 1 % TOPICAL GEL
Freq: Two times a day (BID) | TOPICAL | 6 refills | 0.00000 days | Status: CP
Start: 2018-07-23 — End: 2019-07-23

## 2018-07-23 MED ORDER — MUPIROCIN 2 % TOPICAL OINTMENT
Freq: Three times a day (TID) | TOPICAL | 0 refills | 0 days | Status: CP
Start: 2018-07-23 — End: 2018-08-19

## 2018-07-23 MED ORDER — CYCLOBENZAPRINE 5 MG TABLET
ORAL_TABLET | Freq: Three times a day (TID) | ORAL | 0 refills | 0.00000 days | Status: CP | PRN
Start: 2018-07-23 — End: ?

## 2018-07-23 MED ORDER — MINOCYCLINE 100 MG CAPSULE
ORAL_CAPSULE | Freq: Every evening | GASTROSTOMY | 3 refills | 0 days | Status: CP
Start: 2018-07-23 — End: ?

## 2018-07-23 MED ORDER — ONDANSETRON 8 MG DISINTEGRATING TABLET
ORAL_TABLET | Freq: Three times a day (TID) | ORAL | 1 refills | 0.00000 days | Status: CP | PRN
Start: 2018-07-23 — End: 2018-08-19

## 2018-07-23 MED ORDER — SULFAMETHOXAZOLE 800 MG-TRIMETHOPRIM 160 MG TABLET
ORAL_TABLET | Freq: Two times a day (BID) | ORAL | 0 refills | 0 days | Status: CP
Start: 2018-07-23 — End: 2018-08-19

## 2018-07-23 MED ORDER — LEVOTHYROXINE 75 MCG TABLET
ORAL_TABLET | Freq: Every day | GASTROSTOMY | 3 refills | 0.00000 days
Start: 2018-07-23 — End: ?

## 2018-07-23 MED ORDER — DICLOFENAC 1 % TOPICAL GEL
Freq: Four times a day (QID) | TOPICAL | 4 refills | 0.00000 days | Status: CP | PRN
Start: 2018-07-23 — End: 2019-07-23

## 2018-07-23 MED ORDER — ACETAMINOPHEN 325 MG TABLET
ORAL_TABLET | Freq: Four times a day (QID) | ORAL | 3 refills | 0 days | Status: SS | PRN
Start: 2018-07-23 — End: 2018-08-20

## 2018-07-23 MED ORDER — OXYCODONE 5 MG TABLET
ORAL_TABLET | Freq: Four times a day (QID) | ORAL | 0 refills | 0.00000 days | Status: CP | PRN
Start: 2018-07-23 — End: 2018-08-19

## 2018-07-23 NOTE — Unmapped (Signed)
Patient seen at pcp today d/t possible spider bite last night. Was told by pcp if redness, swelling or pain worsened to come to the ER. 8/10, took tylenol @ 1600. No other problems at this time.

## 2018-07-23 NOTE — Unmapped (Signed)
-   Refilled medications  - Chronic care management enrollment  - Bactroban ointment three times per day for 7 days - cover area  - Follow up in 4 weeks     It was nice to see you today!     New extended hours at the [facility]!    Our family medicine clinic and urgent care provides extended hours and greater access to care for all patients.     In addition to outstanding care of your chronic illnesses, we offer same day, prompt care for health issues that do not require a trip to the emergency room.    If you have an urgent health problem, please call [phone] to discuss with one of our nurses.    Appropriate urgent problems include:  ?? Coughs and colds  ?? Ear infections  ?? Insect bites/stings  ?? Minor cuts and abrasions  ?? Minor eye infections  ?? Sinus infections  ?? Skin rash/poison ivy  ?? Sore throats  ?? Sports physicals  ?? Sprains and strains  ?? Urinary tract infections  ?? X-rays    Monday ??? Friday: 7:00AM - 12:00PM and 1:00PM to 9:00PM  Saturday: 8:00 AM - 5:00 PM  Sunday: 12:00 PM - 5:00 PM    ?? For the after-hours nurse line, please call [phone].    ?? If you think you are having an emergency, please call 911 or go to your nearest emergency department.      Learn more by visiting our website [email or web address]) or by clicking here.

## 2018-07-23 NOTE — Unmapped (Signed)
ASSESSMENT/PLAN:    Problem List Items Addressed This Visit        Respiratory    Congenital tracheomalacia     Trach dependent, followed by Pulmonology. No change in trach secretions, afebrile, well appearing             Digestive    Esophageal reflux - Primary    Dysphagia     Refilled zofran 8 mg q8 PRN          Relevant Medications    ondansetron (ZOFRAN ODT) 8 MG disintegrating tablet       Endocrine    Hypothyroidism (Chronic)     - Refilled Synthroid 75 mcg daily  - TSH at follow up visit          Relevant Medications    levothyroxine (SYNTHROID, LEVOTHROID) 75 MCG tablet       Musculoskeletal and Integument    Skin lesion     Patient presents with 1 week of worsening skin lesion, concerned for bacterial skin/soft tissue infection - impetigo, less likely cellulitis per exam, less likely fungal as no pruritis, or other process.   - Bactroban ointment BID x 7 days, keep area covered  - Wash with soap and water daily  - Provided return precautions         Relevant Medications    diclofenac sodium (VOLTAREN) 1 % gel    mupirocin (BACTROBAN) 2 % ointment    Enthesopathy of right hip region     Patient requesting a new referral to his Hip specialist Lily Kocher at [facility] on [address]   - Referral to Ortho - Dr. Lily Kocher at [facility] [address]   - Refilled Voltaren Gel          Relevant Medications    diclofenac sodium (VOLTAREN) 1 % gel    Other Relevant Orders    Ambulatory referral to Orthopedic Surgery    Acne vulgaris     - Refilled Clindagel 1%, Minocycline 100 mg daily          Relevant Medications    clindamycin (CLINDAGEL) 1 % gel    minocycline (MINOCIN,DYNACIN) 100 MG capsule       Other    VATER syndrome (Chronic)     History of VATER syndrome, followed by Pulmonology, GI, Neurology, and Orthopedics. Trach and g-tube dependent          Relevant Orders    Ambulatory referral to Orthopedic Surgery    Tracheostomy dependence (CMS-HCC) (Chronic)    Pain of hand     History of Hand problems related to VATER syndrome, placed referral to follow up with Dr. Marykay Lex with [facility].          Relevant Orders    Ambulatory referral to Orthopedic Surgery    Muscle spasms of both lower extremities     - Refilled Flexeril 5 mg TID PRN, Gabapentin 300 mg TID, Tylenol 325 mg as needed          Relevant Medications    cyclobenzaprine (FLEXERIL) 5 MG tablet    gabapentin (NEURONTIN) 300 MG capsule    acetaminophen (MAPAP, ACETAMINOPHEN,) 325 MG tablet    Gastrostomy status (CMS-HCC) (Chronic)      Other Visit Diagnoses     Nephrolithiasis        Relevant Medications    ibuprofen (ADVIL,MOTRIN) 600 MG tablet        Attending: Boneta Lucks     Chief Complaint  Patient presents with   ??? Establish Care   ??? Medication Refill       SUBJECTIVE:    Cameron Baker is a 26 y.o. male that presents to clinic today regarding the following issues:    # Establish care  - Doing well, shortness of breath improved since ED visit  - Requesting medication refills as below today.   - Denies fevers/chills  - goals of care - hopes to stay out of the hospital! Numerous ED visits and hospitalizations this year   - denies fevers/chills, CP, SOB, n/v/d  - trach secretions are white, at baseline  - tolerating g-tube feeds well    # Medication refill  - medications requested as above in plan    # Orthopedics referral request   - requesting referrals to [facility] for his Hand, Hip, and Knee specialists, provided names as above    # Skin lesion  - noticed on L hand 1 week ago with 'border'  - has 'eroded' over the past week now with mild erythema  - no purulent drainage  - tried triamcinolone without improvement     A 10 point review of systems is negative unless mentioned above.       HISTORY:  I have reviewed the patients problem list, current medications, allergies, and social history and updated them as needed.    Health Maintenance   Topic Date Due   ??? Influenza Vaccine (1) 08/08/2018   ??? COPD Spirometry  04/07/2023   ??? DTaP/Tdap/Td Vaccines (3 - Td) 07/23/2028   ??? HPV Vaccines  Completed   ??? Pneumococcal Vaccine  Completed       Cameron Baker  reports that he has never smoked. He has never used smokeless tobacco.    OBJECTIVE:    BP (P) 141/93  - Pulse (P) 102  - Temp (P) 36.9 ??C  - Wt 96.3 kg (212 lb 6.4 oz)  - BMI 34.30 kg/m??     Physical Exam   Constitutional:   NAD, sitting on exam table   HENT:   Mouth/Throat: Oropharynx is clear and moist.   Trach in place, no surrounding erythema or discharge    Eyes: Pupils are equal, round, and reactive to light. Conjunctivae are normal.   Neck: Neck supple.   Cardiovascular: Normal rate and regular rhythm.   Pulmonary/Chest: Effort normal.   Upper airway sounds bilaterally without wheezing    Abdominal: Soft. There is no tenderness.   obese   Musculoskeletal: Normal range of motion.   Lymphadenopathy:     He has no cervical adenopathy.   Neurological: He is alert.   Skin: Skin is warm and dry.   1 cm x 1 cm round, well circumscribed area with central erosion and mild surrounding erythema. No purulent drainage

## 2018-07-24 NOTE — Unmapped (Signed)
-   Refilled Clindagel 1%, Minocycline 100 mg daily

## 2018-07-24 NOTE — Unmapped (Signed)
Patient rounds completed. The following patient needs were addressed:  Pain, Toileting, Personal Belongings, Plan of Care, Call Bell in Reach and Bed Position Low .

## 2018-07-24 NOTE — Unmapped (Signed)
History of Hand problems related to VATER syndrome, placed referral to follow up with Dr. Marykay Lex with [facility].

## 2018-07-24 NOTE — Unmapped (Signed)
-   Refilled Flexeril 5 mg TID PRN, Gabapentin 300 mg TID, Tylenol 325 mg as needed

## 2018-07-24 NOTE — Unmapped (Signed)
Patient presents with 1 week of worsening skin lesion, concerned for bacterial skin/soft tissue infection - impetigo, less likely cellulitis per exam, less likely fungal as no pruritis, or other process.   - Bactroban ointment BID x 7 days, keep area covered  - Wash with soap and water daily  - Provided return precautions

## 2018-07-24 NOTE — Unmapped (Signed)
[facility] Emergency Department Provider Note      ED Clinical Impression     Final diagnoses:   Insect bite, initial encounter (Primary)   Cellulitis of left hand       Initial Impression, ED Course, Assessment and Plan   Patient is a 27 y.o. male with a PMH of ADHD, asthma, bipolar 1 disorder, constipation, de Quervain's tenosynovitis, dysphasia, esophageal dysmotility and esophageal stricture, GERD, hypothyroidism, seizure disorder, VATER syndrome and ventilator dependence presenting to the ED for pain, redness and swelling to an area of the left hand just proximal to the thumb at the dorsal aspect.  Treated with Tdap, Toradol will evaluate further with chest x-ray and labs.    Septra DS bid x 10 days  Oxycodone 5 mg q6h prn severe pain #8 NO refill  11:06 PM  Labs reviewed and I note CMP and magnesium within normal limits.  CBC with WBC slightly elevated at 12.7 and H&H 12.9/39.5.  Platelet count slightly elevated at 489.  Previous CBC noted and above labs at baseline.  Chest x-ray without acute process.    Patient ready for discharge to home and diagnosed with possible insect bite was mild cellulitis.  Precautions given for return.  Wound care instructions given as well.      Discussed my evaluation of the patient's symptoms, my clinical impression, and my proposed outpatient treatment plan with the patient. We have discussed anticipatory guidance, scheduled follow-up, and careful return precautions.  The patient expresses understanding, is agreeable, and comfortable with the discharge plan. Denies any questions or concerns at this time. NAD noted upon discharge.     Labs     Labs Reviewed   COMPREHENSIVE METABOLIC PANEL - Abnormal; Notable for the following components:       Result Value    Anion Gap 8 (*)     All other components within normal limits   CBC W/ AUTO DIFF - Abnormal; Notable for the following components:    WBC 12.7 (*)     HGB 12.9 (*)     HCT 39.5 (*)     MCV 74.4 (*)     MCH 24.2 (*) RDW 16.0 (*)     MPV 6.9 (*)     Platelet 489 (*)     Variable HGB Concentration Slight (*)     Neutrophil Left Shift 1+ (*)     Absolute Neutrophils 9.0 (*)     Absolute Monocytes 0.9 (*)     Microcytosis Moderate (*)     Hypochromasia Marked (*)     All other components within normal limits    Narrative:     Please use the Absolute Differential for reference ranges.    SLIDE REVIEW - Abnormal; Notable for the following components:    Smear Review Comments See Comment (*)     All other components within normal limits   MAGNESIUM - Normal   CBC W/ DIFFERENTIAL    Narrative:     The following orders were created for panel order CBC w/ Differential.  Procedure                               Abnormality         Status                     ---------                               -----------         ------  CBC w/ Differential[[id number]]         Abnormal            Final result               Morphology Review[[id number]]           Abnormal            Final result                 Please view results for these tests on the individual orders.       Radiology     Xr Chest 2 Views    Result Date: 07/23/2018  EXAM: XR CHEST 2 VIEWS DATE: 07/23/2018 10:30 PM ACCESSION: [id number] UN DICTATED: 07/23/2018 10:38 PM INTERPRETATION LOCATION: [facility] CLINICAL INDICATION: 27 years old Male with FEVER -  cough and rhonchi  COMPARISON: 07/16/2018 TECHNIQUE: PA and Lateral Chest Radiographs. FINDINGS: Tracheostomy tube overlies the tracheal air column. Right-sided Port-A-Cath in unchanged position. No focal consolidation. No pleural effusion or pneumothorax. Unremarkable cardiomediastinal silhouette.     No acute airspace disease.      History     Chief Complaint  Insect Bite      HPI   Patient was seen by me at 7:36 PM.    Patient is a 27 y.o. male with a PMH of ADHD, asthma, bipolar 1 disorder, constipation, de Quervain's tenosynovitis, dysphasia, esophageal dysmotility and esophageal stricture, GERD, hypothyroidism, seizure disorder, VATER syndrome and ventilator dependence presenting to the ED for pain, redness and swelling to an area of the left hand just proximal to the thumb at the dorsal aspect.    Patient reports 2 days ago he noted a red area with a central white patch to the left hand dorsal aspect of the left hand, thought to possibly be an insect bite.  He did not see an insect bite him.  States subsequent to this the central area sounds like a pustule, has resolved with residual approximate 1 cm area of what appears to be a resolving pustule or vesicle with surrounding erythema.  Surrounding erythema is symmetrical and extends about 1 cm past the center of the wound circumferentially.  Fluctuance or induration and no drainage.  Erythema without streaking of erythema.  Full range of motion of the hand despite pain.  Neurosensory examination within normal limits remainder of skin is clear.    Previous chart, nursing notes, and vital signs reviewed.  Denies past history of MRSA.  Last tetanus booster 2010.    Patient reports that he had a visit with primary care provider today to establish care with new PCP.  Reports that he was advised to apply Bactroban to the wound given oral antibiotics.  States he presents to the emergency department due to increasing pain since that time.  He does report some shaking chills today without known fever.  States he did feel hot had a bit of nausea with dry heaves.  He does report that he ate today.     VATER syndrome reviewed and noted to be a set of birth defects that occurred together including the trachea with history of chronic esophageal strictures and chronic tracheobronchitis with tracheostomy.  Remainder of past medical history reviewed as well.    Pertinent labs & imaging results that were available during my care of the patient were reviewed by me and considered in my medical decision making (see chart for details).    Past Medical History:   Diagnosis Date   ???  ADHD    ??? Asthma    ??? Bipolar 1 disorder (CMS-HCC)    ??? Chronic tracheobronchitis (CMS-HCC)    ??? Constipation    ??? De Quervain's tenosynovitis    ??? Dysphagia    ??? Esophageal dysmotility    ??? Esophageal stricture    ??? GERD (gastroesophageal reflux disease)    ??? hypothyroid    ??? Seizures (CMS-HCC)    ??? Tracheobronchomalacia    ??? VATER syndrome    ??? Ventilator dependence (CMS-HCC)        Past Surgical History:   Procedure Laterality Date   ??? ANAL DILATION  01/21/2006   ??? ESOPHAGOGASTRODUODENOSCOPY      multiple   ??? HIP SURGERY Right 01/19/2017    shaved   ??? IR INSERT PORT AGE GREATER THAN 5 YRS  06/22/2018    IR INSERT PORT AGE GREATER THAN 5 YRS 06/22/2018 Andres Labrum, MD [facility]   ??? LATERAL RECTUS RECESSION  11/15/2003   ??? NISSEN FUNDOPLICATION  2001    x 2 redos   ??? PORTACATH PLACEMENT  01/21/2006    taken out same year   ??? PR ESOPHAGEAL MOTILITY STUDY, MANOMETRY N/A 04/23/2018    Procedure: ESOPHAGEAL MOTILITY STUDY W/INT & REP;  Surgeon: Nurse-Based Giproc;  [facility]: [facility];  Service: Gastroenterology   ??? PR UP GI ENDOSCOPY,BALL DIL,30MM N/A 02/22/2018    Procedure: UGI ENDO; W/BALLOON DILAT ESOPHAGUS (<30MM DIAM);  Surgeon: Zetta Bills, MD;  Location: [facility];  Service: Gastroenterology   ??? PR UP GI ENDOSCOPY,BALL DIL,30MM N/A 05/20/2018    Procedure: UGI ENDO; W/BALLOON DILAT ESOPHAGUS (<30MM DIAM);  Surgeon: Rona Ravens, MD;  Location: [facility];  Service: Gastroenterology   ??? REMOVAL VENOUS ACCESS PORT [facility] HISTORICAL RESULT)  11/03/2006   ??? STRABISMUS SURGERY Right    ??? TRACHEOSTOMY  1994   ??? WISDOM TOOTH EXTRACTION  04/2012   ??? WRIST SURGERY      3 times.          Current Facility-Administered Medications:   ???  diph,pertuss(acel),tetanus vaccine-Tdap (BOOSTRIX) injection 0.5 mL, 0.5 mL, Intramuscular, Once, Modesta Messing, PA  ???  oxyCODONE (ROXICODONE) immediate release tablet 5 mg, 5 mg, Oral, Once, Modesta Messing, PA Current Outpatient Medications:   ???  acetaminophen (MAPAP, ACETAMINOPHEN,) 325 MG tablet, Take 1 tablet (325 mg total) by mouth every six (6) hours as needed for pain., Disp: 90 tablet, Rfl: 3  ???  albuterol HFA 90 mcg/actuation inhaler, Inhale 2 puffs every six (6) hours as needed for wheezing., Disp: 1 Inhaler, Rfl: 11  ???  arformoterol (BROVANA), Inhale 2 mL (15 mcg total) by nebulization Two (2) times a day., Disp: 120 mL, Rfl: 11  ???  azithromycin (ZITHROMAX) 500 MG tablet, 1 tablet (500 mg total) by G-tube route 3 (three) times a week., Disp: 12 tablet, Rfl: 11  ???  budesonide (PULMICORT) 0.5 mg/2 mL nebulizer solution, Inhale 4 mL (1 mg total) by nebulization Two (2) times a day., Disp: 240 mL, Rfl: 11  ???  budesonide-formoterol (SYMBICORT) 160-4.5 mcg/actuation inhaler, Inhale 2 puffs Two (2) times a day., Disp: 1 Inhaler, Rfl: 11  ???  cetirizine (ZYRTEC) 10 MG tablet, 1 tablet (10 mg total) by G-tube route every evening., Disp: , Rfl: 0  ???  cholecalciferol, vitamin D3, (VITAMIN D3) 2,000 unit cap, 3 capsules (6,000 Units total) by G-tube route daily., Disp: , Rfl: 0  ???  citalopram (CELEXA) 40  MG tablet, 1 tablet (40 mg total) by G-tube route nightly., Disp: 90 tablet, Rfl: 0  ???  clindamycin (CLINDAGEL) 1 % gel, Apply topically Two (2) times a day., Disp: 60 g, Rfl: 6  ???  cyclobenzaprine (FLEXERIL) 5 MG tablet, Take 1 tablet (5 mg total) by mouth Three (3) times a day as needed for muscle spasms., Disp: 15 tablet, Rfl: 0  ???  diclofenac sodium (VOLTAREN) 1 % gel, Apply 2 g topically 4 (four) times a day as needed., Disp: 100 g, Rfl: 4  ???  dicyclomine (BENTYL) 10 mg capsule, 10 mg by G-tube route 4 (four) times a day as needed. , Disp: , Rfl:   ???  fluticasone (FLONASE) 50 mcg/actuation nasal spray, 2 sprays by Each Nare route daily. , Disp: , Rfl:   ???  gabapentin (NEURONTIN) 300 MG capsule, 1 capsule (300 mg total) by G-tube route Three (3) times a day., Disp: 180 capsule, Rfl: 3  ???  ibuprofen (ADVIL,MOTRIN) 600 MG tablet, 1 tablet (600 mg total) by G-tube route every six (6) hours as needed for pain., Disp: 30 tablet, Rfl: 1  ???  ipratropium-albuterol (DUO-NEB) 0.5-2.5 mg/3 mL nebulizer, Inhale 3 mL 4 (four) times a day. , Disp: , Rfl:   ???  levothyroxine (SYNTHROID, LEVOTHROID) 75 MCG tablet, 1 tablet (75 mcg total) by G-tube route daily., Disp: 90 tablet, Rfl: 3  ???  lidocaine-prilocaine (EMLA) cream, APPLY TOPICALLY DAILY AS NEEDED FOR PORT ACCESS, Disp: 30 g, Rfl: 0  ???  linaclotide (LINZESS) 290 mcg capsule, 1 capsule (290 mcg total) by G-tube route daily., Disp: 30 capsule, Rfl: 0  ???  LORazepam (ATIVAN) 0.5 MG tablet, Take 1 tablet (0.5mg ) twice daily and 1 tablet (0.5mg ) daily as needed for anxiety via G-tube, Disp: 70 tablet, Rfl: 2  ???  minocycline (MINOCIN,DYNACIN) 100 MG capsule, 1 capsule (100 mg total) by G-tube route nightly., Disp: 90 capsule, Rfl: 3  ???  mirtazapine (REMERON) 15 MG tablet, 1 tablet (15 mg total) by G-tube route nightly., Disp: 90 tablet, Rfl: 0  ???  montelukast (SINGULAIR) 10 mg tablet, Take 1 tablet (10 mg total) by mouth nightly., Disp: 30 tablet, Rfl: 11  ???  mupirocin (BACTROBAN) 2 % ointment, Apply topically Three (3) times a day. for 7 days, Disp: 30 g, Rfl: 0  ???  omeprazole (PRILOSEC) 20 MG capsule, 20 mg Two (2) times a day (30 minutes before a meal). Via G-tube, Disp: , Rfl:   ???  ondansetron (ZOFRAN ODT) 8 MG disintegrating tablet, Take 1 tablet (8 mg total) by mouth every eight (8) hours as needed for nausea. for up to 7 days, Disp: 30 tablet, Rfl: 1  ???  oxyCODONE (ROXICODONE) 5 MG immediate release tablet, Take 1 tablet (5 mg total) by mouth every six (6) hours as needed for pain. for up to 3 days, Disp: 8 tablet, Rfl: 0  ???  PARI LC D NEBULIZER Misc, Provide 1 device  for use with inhaled medication., Disp: 2 each, Rfl: 11  ???  polyethylene glycol (MIRALAX) 17 gram packet, 17 g by G-tube route daily as needed. (Patient not taking: Reported on 07/23/2018), Disp: 20 packet, Rfl: 0  ??? predniSONE (DELTASONE) 10 MG tablet, 40 mg x 4 days, 30 mg x 4 days, 20 mg x 4 days, 10 mg x 4 days (Patient not taking: Reported on 07/23/2018), Disp: 40 tablet, Rfl: 0  ???  promethazine (PHENERGAN) 6.25 mg/5 mL syrup, Take 10 mL (12.5 mg total) by  mouth Three (3) times a day as needed for nausea., Disp: 900 mL, Rfl: 2  ???  sodium chloride (NS) 0.9 % injection, INFUSE INTO VENOUS CATHETER 4 TIMES DAILY AS NEEDED (PORT FLUSH), Disp: 160 mL, Rfl: 0  ???  sodium chloride (NS) 0.9 % Soln, Infuse 10 mL into a venous catheter 4 (four) times a day as needed (port flush)., Disp: 60 vial, Rfl: 0  ???  sodium chloride 3 % nebulizer solution, Inhale 4 mL by nebulization 4 (four) times a day., Disp: 480 mL, Rfl: 1  ???  sulfamethoxazole-trimethoprim (BACTRIM DS) 800-160 mg per tablet, Take 1 tablet (160 mg of trimethoprim total) by mouth Two (2) times a day. for 10 days, Disp: 20 tablet, Rfl: 0  ???  tobramycin, PF, (TOBI) 300 mg/5 mL nebulizer solution, INHALE (300MG ) VIA NEBULIZER EVERY TWELVE HOURS EVERY OTHER MONTH, Disp: 280 mL, Rfl: 5  ???  triamcinolone (KENALOG) 0.1 % cream, Apply topically Two (2) times a day., Disp: 15 g, Rfl: 11  ???  valproate (DEPAKENE) 250 mg/5 mL syrup, Take 5ml (250mg ) by G-tube every morning and 15ml (750mg ) every evening, Disp: 1850 mL, Rfl: 0    Allergies  Xopenex [levalbuterol hcl]; Allopurinol analogues; and Versed [midazolam]    Family History   Adopted: Yes       Social History  Social History     Tobacco Use   ??? Smoking status: Never Smoker   ??? Smokeless tobacco: Never Used   Substance Use Topics   ??? Alcohol use: Yes     Alcohol/week: 2.0 standard drinks     Types: 2 Cans of beer per week     Comment: on weekends sometimes   ??? Drug use: No       Review of Systems  Constitutional: Negative for fever.  Possible subjective fever.  Eyes: Negative for visual changes.  ENT: Negative for sore throat.  Cardiovascular: Negative for chest pain.  Respiratory: Negative for shortness of breath. Gastrointestinal: Negative for abdominal pain, vomiting or diarrhea.  Genitourinary: Negative for dysuria.   Musculoskeletal: Negative for back pain.  Skin: Negative for rash.  Positive for break in the skin left hand as noted in HPI.  Neurological: Negative for headaches, focal weakness or numbness.        Physical Exam     VITAL SIGNS:    Vitals:    07/23/18 1651 07/23/18 2047 07/23/18 2315   BP: 130/86 144/89 135/83   Pulse: 99 92 91   Resp: 18 22 20    Temp: 37.2 ??C (99 ??F) 36.7 ??C (98.1 ??F) 36.9 ??C (98.5 ??F)   TempSrc: Skin Oral Oral   SpO2: 99% 95% 98%       Constitutional: Alert and oriented. Well appearing and in no distress.  Patient with tracheostomy is very pleasant and a good historian.  Eyes: Conjunctivae are normal. PERRLA. EOMs intact.   ENT       Head: Normocephalic and atraumatic.       Ear: EACs without exudate or erythema. TMs without erythema or effusion       Nose: Mild congestion chronic. No epistaxis.       Mouth/Throat: Mucous membranes are moist without lesions/ulcerations. Posterior oropharynx patent. Tonsils without erythema or exudate. Uvula is midline. Soft palate is soft without induration.        Neck: No stridor. Full ROM without pain. No spinous process tenderness. No nuchal rigidity.  Tracheostomy.  Hematological/Lymphatic/Immunilogical: No cervical lymphadenopathy appreciated.  Cardiovascular: Normal  rate, regular rhythm. Normal and symmetric distal pulses are present in all extremities. No gallops, murmurs, or clicks.  Respiratory: Normal respiratory effort. Breath sounds are normal.  Gastrointestinal: Soft and nontender. BS active. No guarding or rigidity. Negative McBurney's point. Negative Murphy's sign. No CVA tenderness.    Musculoskeletal: Nontender joints and muscles with normal range of motion in all extremities.  Neurologic: Normal speech and language. Cranial nerves II-XII intact. Normal tandem gait.  Neurosensory exam within normal limits.  Skin: Skin is warm, dry and intact. No rash noted. No pallor.  Exception is break in the skin left hand dorsal aspect proximal to the MCP joints (second and third) with central area noted to be about 1 cm in diameter peers to be a resolving pustule or vesicle with surrounding erythema that is about 1 cm in diameter and circumferential.  Mild edema to this area.  No streaking of erythema and no associated lymphadenopathy.  Psychiatric: Mood and affect are normal. Speech and behavior are normal.              Modesta Messing, PA  07/23/18 2322

## 2018-07-24 NOTE — Unmapped (Signed)
History of VATER syndrome, followed by Pulmonology, GI, Neurology, and Orthopedics. Trach and g-tube dependent

## 2018-07-24 NOTE — Unmapped (Signed)
Trach dependent, followed by Pulmonology. No change in trach secretions, afebrile, well appearing

## 2018-07-24 NOTE — Unmapped (Addendum)
Patient requesting a new referral to his Hip specialist Lily Kocher at [facility] on [address]   - Referral to Ortho - Dr. Lily Kocher at [facility] [address]   - Refilled Voltaren Gel

## 2018-07-24 NOTE — Unmapped (Signed)
ED Progress Note  Correction to ED note today 07/23/2018???    Lungs with mild transient rhonchi and otherwise clear with good breath sounds throughout.    Xr Chest 2 Views    Result Date: 07/23/2018  EXAM: XR CHEST 2 VIEWS DATE: 07/23/2018 10:30 PM ACCESSION: [id number] UN DICTATED: 07/23/2018 10:38 PM INTERPRETATION LOCATION: [facility] CLINICAL INDICATION: 27 years old Male with FEVER -  cough and rhonchi  COMPARISON: 07/16/2018 TECHNIQUE: PA and Lateral Chest Radiographs. FINDINGS: Tracheostomy tube overlies the tracheal air column. Right-sided Port-A-Cath in unchanged position. No focal consolidation. No pleural effusion or pneumothorax. Unremarkable cardiomediastinal silhouette.     No acute airspace disease.

## 2018-07-24 NOTE — Unmapped (Addendum)
Refilled zofran 8 mg q8 PRN

## 2018-07-24 NOTE — Unmapped (Signed)
-   Refilled Synthroid 75 mcg daily  - TSH at follow up visit

## 2018-07-25 NOTE — Unmapped (Signed)
I saw and evaluated the patient, participating in the key portions of the service.  I reviewed the resident’s note.  I agree with the resident’s findings and plan. Percell Lamboy Debra Nely Dedmon, MD

## 2018-07-26 ENCOUNTER — Encounter
Admit: 2018-07-26 | Discharge: 2018-07-27 | Payer: MEDICARE | Attending: Student in an Organized Health Care Education/Training Program | Primary: Student in an Organized Health Care Education/Training Program

## 2018-07-26 DIAGNOSIS — L989 Disorder of the skin and subcutaneous tissue, unspecified: Principal | ICD-10-CM

## 2018-07-26 NOTE — Unmapped (Signed)
Here for ED f/u after being seen after clinic visit for wound on left hand, thought likely 2/2 insect bite. Prescribed 10 days bactrim, given Tdap in ED, short course of oxy. Wound appears to be healing well. Area of redness not spreading. Does have pain at left wrist but full ROM. Okay to continue antibiotic course, return later this week for wound re-check to ensure healing, return sooner precautions given, increase normal gabapentin dosing to 600 TID to see if helps with pain.

## 2018-07-26 NOTE — Unmapped (Signed)
Chronic Care Management Progress Note  [facility]             Date of Service:  07/26/2018      Service: Care Management - phone      Purpose of contact:         Discussed the following with Lenis Noon:    Follow up on spider bite      Interventions Provided:  Pt called stating that they went to the ED for a spider bite on 07/23/18 and requested appt with PCP, Dr. Raford Pitcher today. PCP had no available appts today, appt scheduled with Dr. Salli Real for this morning at 11:15 am to f/u on spider bite. No other needs identified at this time.      Additional Information/Plan:  Pt was provided with my direct office phone number should additional needs arise.      There are no preventive care reminders to display for this patient.      Ottie Glazier, MSW, M.Ed, [facility]  Population Health Specialist- [facility]  [phone]

## 2018-07-26 NOTE — Unmapped (Signed)
[facility]- [facility]  Established Patient Clinic Note    Assessment/Plan:   Mr.Feltus is a 27 y.o.male who presented for:    Problem List Items Addressed This Visit        Musculoskeletal and Integument    Skin lesion - Primary     Here for ED f/u after being seen after clinic visit for wound on left hand, thought likely 2/2 insect bite. Prescribed 10 days bactrim, given Tdap in ED, short course of oxy. Wound appears to be healing well. Area of redness not spreading. Does have pain at left wrist but full ROM. Okay to continue antibiotic course, return later this week for wound re-check to ensure healing, return sooner precautions given, increase normal gabapentin dosing to 600 TID to see if helps with pain.                Attending: Dr. Manson Passey    Subjective   Mr. Kishimoto is a 27 y.o. male  coming to clinic today for the following issues:    Chief Complaint   Patient presents with   ??? Follow-up     spider bite     HPI:    #ED follow up after insect/spider bite  -erythema same  -no pus  -no fevers  -not quite feeling perfect  -staying hydration  -just started taking abx  -wearing bandaid  -pain at site of wound and in left wrist - pain increasing some  -can still move and use left hand  -sensation intact in left hand  -took some oxycodone given by ED  -taking tylenol 650, motrin 600 alternating q6-8 hours    I have reviewed the problem list, medications, and allergies and have updated/reconciled them if needed.    Mr. Kimberlin  reports that he has never smoked. He has never used smokeless tobacco.  Health Maintenance   Topic Date Due   ??? Influenza Vaccine (1) 08/08/2018   ??? COPD Spirometry  04/07/2023   ??? DTaP/Tdap/Td Vaccines (3 - Td) 07/23/2028   ??? HPV Vaccines  Completed   ??? Pneumococcal Vaccine  Completed       Objective     VITALS: BP 145/93  - Pulse 90  - Temp 37 ??C (Oral)  - Ht 167.6 cm (5' 5.98)  - Wt 97.1 kg (214 lb)  - BMI 34.56 kg/m??     Physical Exam   GEN: NAD; Sitting comfortably in office, pleasant.  HEAD: Normocephalic, atraumatic.  ENT: Ears externally normal. No nasal congestion.   LUNGS: Normal respiratory effort. Trach in place.  DERM: See picture below. Left hand: ~1cm ulcerative lesion on left hand, with ~0.5cm border of erythema surrounding. No warmth. TTP around wound and at left wrist. Mild inflammation. Full wrist flexion and extension (but with pain).              [facility]  [place] [facility] [place] [facility]  [address], [place], [place] [address] ??? Telephone [phone] ??? Fax [phone]  [email or web address]

## 2018-07-26 NOTE — Unmapped (Signed)
[facility] form for addendum to plan of care  Faxed and confirmation received

## 2018-07-26 NOTE — Unmapped (Signed)
Let us know if things get worse! Spreading redness, fevers, inability to move wrist, or any other concerns!

## 2018-07-27 NOTE — Unmapped (Signed)
Immediately after or during the visit, I reviewed with the resident the medical history and the resident???s findings on physical examination.  I discussed with the resident the patient???s diagnosis and concur with the treatment plan as documented in the resident note. Anayi Bricco M Aastha Dayley, MD

## 2018-07-27 NOTE — Unmapped (Signed)
Orthopedic Surgery for Dr. Raelyn Mora  [phone]  Fax: [phone]

## 2018-07-27 NOTE — Unmapped (Unsigned)
[facility] Refill Coordination Note  **Pt Medicaid is not active at the moment. Sent referral for assistance. Pt starts 07/30/18.  Specialty Medication(s) to be Shipped:   CF/Pulmonary: -Generic inhaled tobramycin 300mg /61mL inhalation solution    Other medication(s) to be shipped:   Cameron Baker Cupx2     Cameron Baker, DOB: December 18, 1990  Phone: [phone] (home) [phone] (work)  Shipping Address: [address] [place] [address]    All above HIPAA information was verified with patient.     Completed refill call assessment today to schedule patient's medication shipment from the [facility] [phone]).       Specialty medication(s) and dose(s) confirmed: Regimen is correct and unchanged.   Changes to medications: Cameron Baker reports no changes reported at this time.  Changes to insurance: Medicaid not active. He is working on situation. Sent referral to [facility]  Questions for the pharmacist: No    The patient will receive a drug information handout for each medication shipped and additional FDA Medication Guides as required.      DISEASE/MEDICATION-SPECIFIC INFORMATION        For CF patients: CF [employer] Active? No-not enrolled    ADHERENCE              MEDICARE PART B DOCUMENTATION     Not Applicable    SHIPPING     Shipping address confirmed in Epic.     Delivery Scheduled: Yes, Expected medication delivery date: 07/28/18 via UPS or courier.     Lajean Silvius   [facility] Specialty Technician

## 2018-07-28 ENCOUNTER — Ambulatory Visit: Admit: 2018-07-28 | Discharge: 2018-08-19 | Payer: MEDICARE

## 2018-07-28 ENCOUNTER — Encounter: Admit: 2018-07-28 | Discharge: 2018-08-19 | Payer: MEDICARE | Attending: Anesthesiology | Primary: Anesthesiology

## 2018-07-28 ENCOUNTER — Non-Acute Institutional Stay: Admit: 2018-07-28 | Discharge: 2018-08-19 | Payer: MEDICARE

## 2018-07-28 ENCOUNTER — Encounter: Admit: 2018-07-28 | Discharge: 2018-07-29 | Payer: MEDICARE | Attending: Family | Primary: Family

## 2018-07-28 DIAGNOSIS — L03114 Cellulitis of left upper limb: Principal | ICD-10-CM

## 2018-07-28 DIAGNOSIS — R1310 Dysphagia, unspecified: Principal | ICD-10-CM

## 2018-07-28 LAB — BASIC METABOLIC PANEL
ANION GAP: 7 mmol/L — ABNORMAL LOW (ref 9–15)
BUN / CREAT RATIO: 13
CHLORIDE: 102 mmol/L (ref 98–107)
CO2: 28 mmol/L (ref 22.0–30.0)
CREATININE: 0.91 mg/dL (ref 0.70–1.30)
EGFR CKD-EPI AA MALE: >90 mL/min/{1.73_m2} (ref >=60)
EGFR CKD-EPI NON-AA MALE: >90 mL/min/{1.73_m2} (ref >=60)
POTASSIUM: 4 mmol/L (ref 3.5–5.0)
SODIUM: 137 mmol/L (ref 135–145)

## 2018-07-28 LAB — CBC W/ AUTO DIFF
BASOPHILS ABSOLUTE COUNT: 0.1 10*9/L (ref 0.0–0.1)
BASOPHILS RELATIVE PERCENT: 0.4 %
EOSINOPHILS ABSOLUTE COUNT: 0.2 10*9/L (ref 0.0–0.4)
EOSINOPHILS RELATIVE PERCENT: 1.4 %
HEMATOCRIT: 40.3 % — ABNORMAL LOW (ref 41.0–53.0)
HEMOGLOBIN: 13 g/dL — ABNORMAL LOW (ref 13.5–17.5)
LARGE UNSTAINED CELLS: 1 % (ref 0–4)
LYMPHOCYTES ABSOLUTE COUNT: 2.5 10*9/L (ref 1.5–5.0)
LYMPHOCYTES RELATIVE PERCENT: 19.6 %
MEAN CORPUSCULAR HEMOGLOBIN CONC: 32.3 g/dL (ref 31.0–37.0)
MEAN CORPUSCULAR HEMOGLOBIN: 24.1 pg — ABNORMAL LOW (ref 26.0–34.0)
MEAN CORPUSCULAR VOLUME: 74.7 fL — ABNORMAL LOW (ref 80.0–100.0)
MEAN PLATELET VOLUME: 6.8 fL — ABNORMAL LOW (ref 7.0–10.0)
MONOCYTES ABSOLUTE COUNT: 0.6 10*9/L (ref 0.2–0.8)
MONOCYTES RELATIVE PERCENT: 4.9 %
NEUTROPHILS ABSOLUTE COUNT: 9.4 10*9/L — ABNORMAL HIGH (ref 2.0–7.5)
NEUTROPHILS RELATIVE PERCENT: 72.6 %
PLATELET COUNT: 454 10*9/L — ABNORMAL HIGH (ref 150–440)
RED BLOOD CELL COUNT: 5.39 10*12/L (ref 4.50–5.90)
RED CELL DISTRIBUTION WIDTH: 16 % — ABNORMAL HIGH (ref 12.0–15.0)

## 2018-07-28 LAB — ERYTHROCYTE SEDIMENTATION RATE: Lab: 8

## 2018-07-28 LAB — BASOPHILS RELATIVE PERCENT: Lab: 0.4

## 2018-07-28 LAB — C-REACTIVE PROTEIN: C reactive protein:MCnc:Pt:Ser/Plas:Qn:: 8.9

## 2018-07-28 LAB — CREATININE: Creatinine:MCnc:Pt:Ser/Plas:Qn:: 0.91

## 2018-07-28 NOTE — Unmapped (Signed)
Patient Education        Cellulitis: Care Instructions  Your Care Instructions    Cellulitis is a skin infection caused by bacteria, most often strep or staph. It often occurs after a break in the skin from a scrape, cut, bite, or puncture, or after a rash.  Cellulitis may be treated without doing tests to find out what caused it. But your doctor may do tests, if needed, to look for a specific bacteria, like methicillin-resistant Staphylococcus aureus (MRSA).  The doctor has checked you carefully, but problems can develop later. If you notice any problems or new symptoms, get medical treatment right away.  Follow-up care is a key part of your treatment and safety. Be sure to make and go to all appointments, and call your doctor if you are having problems. It's also a good idea to know your test results and keep a list of the medicines you take.  How can you care for yourself at home?  ?? Take your antibiotics as directed. Do not stop taking them just because you feel better. You need to take the full course of antibiotics.  ?? Prop up the infected area on pillows to reduce pain and swelling. Try to keep the area above the level of your heart as often as you can.  ?? If your doctor told you how to care for your wound, follow your doctor's instructions. If you did not get instructions, follow this general advice:  ? Wash the wound with clean water 2 times a day. Don't use hydrogen peroxide or alcohol, which can slow healing.  ? You may cover the wound with a thin layer of petroleum jelly, such as Vaseline, and a nonstick bandage.  ? Apply more petroleum jelly and replace the bandage as needed.  ?? Be safe with medicines. Take pain medicines exactly as directed.  ? If the doctor gave you a prescription medicine for pain, take it as prescribed.  ? If you are not taking a prescription pain medicine, ask your doctor if you can take an over-the-counter medicine.  To prevent cellulitis in the future  ?? Try to prevent cuts, scrapes, or other injuries to your skin. Cellulitis most often occurs where there is a break in the skin.  ?? If you get a scrape, cut, mild burn, or bite, wash the wound with clean water as soon as you can to help avoid infection. Don't use hydrogen peroxide or alcohol, which can slow healing.  ?? If you have swelling in your legs (edema), support stockings and good skin care may help prevent leg sores and cellulitis.  ?? Take care of your feet, especially if you have diabetes or other conditions that increase the risk of infection. Wear shoes and socks. Do not go barefoot. If you have athlete's foot or other skin problems on your feet, talk to your doctor about how to treat them.  When should you call for help?  Call your doctor now or seek immediate medical care if:  ?? ?? You have signs that your infection is getting worse, such as:  ? Increased pain, swelling, warmth, or redness.  ? Red streaks leading from the area.  ? Pus draining from the area.  ? A fever.   ?? ?? You get a rash.   ??Watch closely for changes in your health, and be sure to contact your doctor if:  ?? ?? You do not get better as expected.   Where can you learn more?  Go  to [facility] at [email or web address]  Select Preferences in the upper right hand corner, then select Health Library under Resources. Enter [phone] in the search box to learn more about Cellulitis: Care Instructions.  Current as of: March 08, 2018  Content Version: 12.1  ?? 2006-2019 [employer]. Care instructions adapted under license by [facility]. If you have questions about a medical condition or this instruction, always ask your healthcare professional. [employer] disclaims any warranty or liability for your use of this information.
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
## 2018-07-28 NOTE — Unmapped (Signed)
Pt reports abscess to left hand seen here Friday and started on abx but has worsened

## 2018-07-29 NOTE — Unmapped (Signed)
Pt wants a diet order placed.  He can swallow food but not pills.

## 2018-07-29 NOTE — Unmapped (Signed)
Patient rounds completed. The following patient needs were addressed:  Pain, Toileting, Plan of Care, Call Bell in Reach and Bed Position Low .

## 2018-07-29 NOTE — Unmapped (Signed)
Rcvd report from off going nurse. Pt on stretcher in room at this time. NAD noted. Will continue to monitor   Pt c/o pain

## 2018-07-29 NOTE — Unmapped (Signed)
tray ordered for pt

## 2018-07-29 NOTE — Unmapped (Addendum)
Cameron Baker is a 27 year-old man with VATER syndrome-associated tracheobronchomalacia s/p trach/PEG and central sleep apnea requiring nocturnal mechanical ventilation who also has a history of frequent admissions for tracheobronchitis who presented to the [facility] ED on 07/28/2018 at the behest of his PCP for consideration of admission for IV antibiotics to treat a left hand cellulitis.  On further interview at presentation, however, one of the main concerns was that his Medicaid had lapsed and this resulted in a gap in his at-home nursing care (required for his nightly ventilation).  Below is his relatively uneventful hospital course by problem:  ??  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  His Medicaid apparently lapsed and resulted in his inability to have necessary at-home nursing care at night for his Trilogy ventilator.  He was continued on his home arfomoterol, budesonide, TOBI nebs, 3% saline nebs, albuterol/ipratropium nebs, azithromycin MWF.  He was continued on his usual home Trilogy vent settings (AVAPS, VT 525 ml, PEEP 10 cm H2O).  By day of discharge, SW was able to secure a Trilogy vent unit for nightly home use and a nurse to be available nightly.   ??  *Type II achalasia, esophageal stenosis secondary to VATER syndrome.  His last EGD was in 05/2018 which showed esophageal stenosis (balloon dilation done at that time).  He complained of some mild dysphagia on 8/26 concerning for recurrent symptomatic stenosis.  GI medicine was consulted and performed an EGD on 8/30 showing two stenosis that were dilated to 20 mm.  His dysphagia resolved.  He will follow up with Dr. Wilford Corner of GI medicine.                   *Bipolar 1 disorder, PTSD, anxiety. ??He has followed with [facility] and undergone psychotherapy previously.?? He was continued on his home citalopram, valproate, mirtazapine, lorazepam 0.5 mg PO BID prn  ??  *Left hand cellulitis.  This resolved with a course of Bactrim (8/16-8/23).

## 2018-07-29 NOTE — Unmapped (Signed)
MD at bedside. 
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
## 2018-07-29 NOTE — Unmapped (Signed)
Called report thru [facility] to [place] at [facility] and [employer].  ETA 1 hour.

## 2018-07-29 NOTE — Unmapped (Signed)
Transport here for pt 

## 2018-07-29 NOTE — Unmapped (Signed)
SW reviewed notes prior to attempting to meet with pt in room to introduce self and role. Pt sleeping at time of SW attempt. SW called [facility] DSS, attempting to get understanding as to what happened with pt's Medicaid coverage. SW spoke with the [facility] worker who had previously followed this patient Marveen Reeks, [phone]). She provided information re: her attempts to get information from the pt, her inability to get in touch with him via phone or mail and the difficulties of communication via email. Ultimately, it was discovered that the pt's Medicaid stopped at the end of June, 2019 and that the pt submitted a new application with application date of 07/07/18. Pt signed authorized rep form, allowing this SW to get information from DSS re: status of Medicaid. Spoke with pt's current intake worker Trula Ore [place], [phone]) who noted that she was still needing information from this pt re: bank account with [employer] and the tax value of a moped that he owns. SW spoke with the pt who stated that he went to DSS yesterday and had information scanned into the system. SW followed up with Ms. Doss to inquire as to whether that information had been added to the pt's application. Will speak further with Ms. Doss as to whether there is a way to expedite the processing of the Medicaid since the pt cannot get PDN without Medicaid coverage. Pt had been getting PDN Sunday night from 11p-7a, as well as W,Th, F, Sat nights from 7p-7a. It is reported that the nighttime hours were to monitor the pt for episodes of apnea.     Social Work  Psychosocial Assessment    Patient Name: Cameron Baker   Medical Record Number: [id number]   Date of Birth: 30-Nov-1991  Sex: Male     Referral  Referred by: Care Manager  Reason for Referral: Complex Discharge Planning, Self-Pay / Financial Issues, Comment  Comment: Consult ordered based on complex discharge planning. Per report, pt lost Medicaid coverage and, in doing so, also lost private duty nursing. Notes indicate that the pt inform DSS staff, as well as hospital staff, that he came to the hospital to receive care until his medicaid was processed and approved. SW contacted Theatre stage manager to f/u on situation. Spoke with previous worker Marveen Reeks) with [facility] DSS [phone]). She stated that she had called pt late March/early April to inform that DSS needed updated information to renew M'caid. Worker explained that she received an email April 11 with a screenshot of bank statement; however, she noted that the email did not have this patient's name on the account, rather, it had the name of Ruslan Mccabe. M'caid worker explained that she was unsure as to who had sent the information and who's Medicaid it belonged to; therefore, she sent email response inquiring as to the nature of the email and the Medicaid recipient that the bank account information was for. She received no email response. Worker noted that she received email from the pt 7/18 to f/u on the status of his Medicaid. She stated she still did not know who was writing since the email account was for a [employer]. Worker noted that she finally discovered that the account was the pt's but, by that time (July) the pt's Medicaid had been terminated. Previous worker (Ms. Sheria Lang) stated that the pt has now reapplied for M'caid (worker: Arthur Holms) with appl being submitted 7/31; however, some information is still lacking and application cannot be complete without the additional information (bank  account information, tax value of moped). Because pt's PDN [facility]) was paid for through Medicaid, the PDN has now stopped. It's noted that the PDN continued - out of good will- from July through Aug 20 but could not continue. Pt admitted to the hospital the day after services stopped. Pt voiced concern that his DME would be taken back without his having M'caid coverage.     Extended Emergency Contact Information  Primary Emergency Contact: [name] [place]  Home Phone: [phone]  Mobile Phone: [phone]  Relation: Mother    Legal Next of Kin / Guardian / POA / Advance Directives      Advance Directive (Medical Treatment)  Does patient have an advance directive covering medical treatment?: Patient does not have advance directive covering medical treatment.  Reason patient does not have an advance directive covering medical treatment:: Patient does not wish to complete one at this time(SW spoke with pt re: policy on NOK/decision making. He verbalized understanding that mother Media planner) would be surrogate decision maker as she is legal NOK. Father passed away years ago)  Reason there is not a Health Care Decision Maker appointed:: Patient does not wish to appoint a Health Care Decision Maker at this time(Mother is surrogate decision maker per policy)  Information provided on advance directive:: Yes  Patient requests assistance:: No    Advance Directive (Mental Health Treatment)  Does patient have an advance directive covering mental health treatment?: Patient does not have advance directive covering mental health treatment.  Reason patient does not have an advance directive covering mental health treatment:: Patient does not wish to complete one at this time.    Discharge Planning    SW confirmed with Boneta Lucks at [facility] [phone]) that the pt would be able to keep DME with his [facility] Medicare covering. She noted that they would bill him but that it was important for him to understand that his Medicaid, because it was retroactive, would pick up the cost of the equipment, when he was approved. Still working to figure out what to do about PDN since it was funded through [employer] and pt cannot afford to pay for the services privately.    Social Determinants of Health  Social Determinants of Health were addressed in provider documentation.  Please refer to patient history.    Social History  Support Systems: Parent(Primary support for this pt is his mother Kuba Shepherd, [phone], [phone]), who lives in [place]. Pt's cell: [phone])                          Military Service: No Conservator, museum/gallery and Psychiatric History  Psychosocial Stressors: Coping with health challenges/recent hospitalization, Financial concerns, Comment, Housing Insecure   Comment: Previous notes document plan for the pt to move into [employer] in August; however, pt noted that there was a problem with his bank account (unintentionally closed when he opened a separate account) and his social security check was not deposited. Pt explained that social security did a trace and now they are in the process of reissuing the check; however, the pt has been staying with a roommate in [place] until he is able to afford his own place. The pt mentioned that he has also been spending time in [place], staying with friends in that area, as well. Pt with disability coverage that, per pt,  started around 2013, when he was in [facility] for numerous psych issues. He noted that, in addition to his own disability income, he also gets suvivor benefits as a disabled dependent following his father's passing a few years ago. Pt with [facility] Medicare coverage. He had been covered by Medicaid as secondary but Medicaid was terminated June 30 due to miscommunication between the pt and Careers information officer, resulting in lack of information being provided to [employer]. Pt verbalized a significant amount of stress related to loss of Medicaid as his private duty nursing service and DME (trilogy, cough assist, trach supplies, etc) were paid for through Medicaid. In addition, the pt mentioned that he is struggling to afford his Tobramycin, explaining that he gets charged $500-$600 for a supply. He explained that he has a month of taking the medicine followed by a month of being off the medication. Per pt, this is his month to be taking the medication. Pt stated that he remains independent with ADLs and fairly active, noting that his primary needs, at this time, are for Medicaid coverage and PDN.  Psychological Issues/Information: Mental illness   Concerns: Patient history of mental illness, Suicidal ideation / attempt, Homicidal ideation / attempt, Outpatient treatment for mental illness       Comment: SW reviewed pt's psych history and discovered h/o dx for Mood disorder, depression, anxiety, PTSD, Disruptive behavior disorder, antisocial personality d/o, somatoform disorder, and borderline personality disorder. Pt being followed by Dr. Lucile Shutters with [facility]. Documentation noted that the pt has numerous psych hospitalizations, including psych hospitalization from 2011 - 2014 at [facility] following multiple hospitalizations related to aggression, SI, the VACTERL syndrome, and his adoptive family saying he could no longer live with them due to his behavior  Chemical Dependency: Alcohol(Social etoh use but no abuse noted)              Outpatient Providers: Primary Care Provider, Psychiatrist   Name / Contact #: : PCP: Lorelle Gibbs [phone]); Psych: Dr. Lucile Shutters  Legal: No legal issues      Ability to Access Community Services: Lack of resources/eligibility to obtain services, Comment(Pt with significant difficultes with post-acute services, at this time, due to loss of Medicaid and loss of PDN, which is funded through [facility])   Comment: SW called [facility] [phone]) and spoke with manager (Abby) re: this patient. She explained that the pt's Medicaid stopped at the beginning of July and mentioned that the agency continued to provide services in hopes that the Medicaid would return to being effective as, otherwise, they had no means of payment. Abby explained that she had informed the pt that they could provide services if he could pay privately but, if not, they could not provide care. She noted that they could no longer make exceptions. SW spoke with Boneta Lucks with [facility] [phone]) re: this pt's DME and his loss of Medicaid. She stated that they could continue to provide the DME but that the pt would get billed a large amount. SW explained that the pt has submitted an application for Medicaid and noted that it was in process. Boneta Lucks explained that they would bill the pt and that he would not have to pay as long as he was approved for Medicaid. Pt with trilogy, cough assist, trach supplies, suction equipment, etc through [facility].    SW continuing to follow.    [name], MSW, LCSW, ACM/ Pager: [phone]/07/29/2019 3:17 PM

## 2018-07-29 NOTE — Unmapped (Signed)
Pt is resting on stretcher at this time awaiting bed assignment. NAD noted, no needs at this time. Safety  Checks in place, belongings at side. Bed locked and in low position. Will continue to monitor

## 2018-07-29 NOTE — Unmapped (Signed)
Hospitalist Daily Progress Note     LOS: 0 days under inpatient status    Assessment/Plan:  Principal Problem:    Social problem  Active Problems:    Asthma    Slow transit constipation    Dysphagia    Esophageal reflux    Hypothyroidism    Class 1 obesity in adult    Seizures (CMS-HCC)    Post traumatic stress disorder (PTSD)    Chronic tracheobronchitis (CMS-HCC)    Tracheostomy dependence (CMS-HCC)    VATER syndrome    Muscle spasms of both lower extremities    Enthesopathy of right hip region    Acne vulgaris    PEG (percutaneous endoscopic gastrostomy) status (CMS-HCC)    History of home ventilator (CMS-HCC)    Bipolar 1 disorder (CMS-HCC)    Esophageal achalasia    Cellulitis of left hand    Central sleep apnea  Resolved Problems:    * No resolved hospital problems. *         *L hand cellulitis.  Improving.  -Continue sulfa-TMP (started ~8/16).    *Trach dependence.  Stable.  -Continue home arfomoterol, budesonide, TOBI nebs, 3% saline nebs.  -Continue azithro MWF.  -Continue Trilogy home vent qhs.    *VATER syndrome w achalasia, esophageal stenosis.  Stable.    *Bipolar.  Stable.  -Continue citalopram, mirtazapine, VPA.    *Hypothyroid.  -Continue levothyroxine.    *Acne.  -Continue home minocycline.    *DVT prophylaxis.  -Ambulation.    *Dispo.  Stepdown status due to home vent requirement.  Reported lapse in Medicaid that has led to loss of home nursing at night when he is sleeping and at risk of apnea.  (Also potential loss of DME/home vent possibly w lapse in Medicaid).  SW helping me.      Please page the [facility]) pager at [phone] with questions.      Consultants: None      Subjective:   No PRN meds since admission.  No lightheadedness, chest pain, shortness of breath, headache, abdominal pain, nausea, constipation.      Objective:   Physical Exam:  General: Lying comfortably in bed, no acute distress.   Eyes: Anicteric sclerae, no conjunctival injection.     ENT: Mucous membranes moist.  Normal appearing ears, nose.   Neck: Tracheostomy in place.   Respiratory: Normal respiratory effort.  Mostly clear except from stertor from upper airway secretions.   Cardiovascular: Regular rhythm w/o murmur.  Radial pulses 2+ bilaterally.  No lower extremity edema.   Gastrointestinal: Bowel sounds present, soft, nontender, nondistended.  No hepatomegaly.   Skin: No obvious rashes or breakdown.  Warm, dry.   Musculoskeletal: Motor strength in all extremities grossly intact.   Psychiatric: Alert, oriented.  Answers questions appropriately.  Judgment and insight intact.   Neurologic: Extraocular movements intact, no facial asymmetry.  No gross sensory deficits.         Vital signs in last 24 hours:  Temp:  [36.6 ??C (97.8 ??F)-37.3 ??C (99.1 ??F)] 36.6 ??C (97.8 ??F)  Heart Rate:  [78-99] 84  Resp:  [15-18] 15  BP: (138-169)/(73-103) 138/73  FiO2 (%):  [28 %] 28 %  SpO2:  [95 %-98 %] 95 %  BMI (Calculated):  [34.2] 34.2    Intake/Output last 24 hours:  No intake or output data in the 24 hours ending 07/29/18 0541    Most recent recorded weights:  Last 5 Recorded Weights    07/29/18 0420  Weight: 96.1 kg (211 lb 12.8 oz)       Medications:   Scheduled Meds:  ??? albuterol  2.5 mg Nebulization 4x Daily (RT)   ??? arformoterol  15 mcg Nebulization BID   ??? [START ON 07/30/2018] azithromycin  500 mg G-tube Q MWF   ??? budesonide  1 mg Nebulization BID   ??? cetirizine  10 mg G-tube QPM   ??? cholecalciferol (vitamin D3)  5,000 Units Oral Daily   ??? citalopram  40 mg G-tube Nightly   ??? clindamycin   Topical BID   ??? esomeprazole  40 mg G-tube Daily   ??? fluticasone propionate  2 spray Each Nare Daily   ??? gabapentin  300 mg G-tube TID   ??? ipratropium  500 mcg Nebulization 4x Daily (RT)   ??? levothyroxine  75 mcg G-tube Daily   ??? linaCLOtide  290 mcg G-tube Daily   ??? LORazepam  0.5 mg Oral BID   ??? minocycline  100 mg G-tube Nightly   ??? mirtazapine  15 mg G-tube Nightly   ??? montelukast  10 mg Oral Nightly   ??? mupirocin Topical TID   ??? sodium chloride  4 mL Nebulization 4x Daily (RT)   ??? sulfamethoxazole-trimethoprim  1 tablet Oral Q12H [facility]   ??? tobramycin (PF)  300 mg Nebulization BID (RT)   ??? triamcinolone   Topical BID   ??? valproate  250 mg G-tube Q AM
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
## 2018-07-29 NOTE — Unmapped (Addendum)
Care Management  Initial Transition Planning Assessment              General  Care Manager assessed the patient by : In person interview with patient, Medical record review, Discussion with Clinical Care team  Orientation Level: Oriented X4  Who provides care at home?: N/A    Contact/Decision Maker        Extended Emergency Contact Information  Primary Emergency Contact: [name] [place] of [place]  Home Phone: [phone]  Mobile Phone: [phone]  Relation: Mother    Legal Next of Kin / Guardian / POA / Advance Directives       Advance Directive (Medical Treatment)  Does patient have an advance directive covering medical treatment?: Patient would not like information., Patient does not have advance directive covering medical treatment.  Reason patient does not have an advance directive covering medical treatment:: Patient does not wish to complete one at this time  Reason there is not a Health Care Decision Maker appointed:: Patient does not wish to appoint a Health Care Decision Maker at this time  Information provided on advance directive:: No  Patient requests assistance:: No    Advance Directive (Mental Health Treatment)  Does patient have an advance directive covering mental health treatment?: Patient does not have advance directive covering mental health treatment., Patient would not like information.  Reason patient does not have an advance directive covering mental health treatment:: Patient does not wish to complete one at this time.    Patient Information  Lives with: Friends    Type of Residence: Private residence        Location/Detail: [address], [place] [address] phone [phone]    Support Systems: Parent, Friends/Neighbors, Family Members    Responsibilities/Dependents at home?: No    Home Care services in place prior to admission?: Yes  Type of Home Care services in place prior to admission: Home health (specify)  Current Home Care provider (Name/Phone #): [facility]    Outpatient/Community Resources in place prior to admission: Clinic          Current HME Agency (Name/Phone #): [facility]    Currently receiving outpatient dialysis?: No       Financial Information       Need for financial assistance?: No       Social Determinants of Health  Social Determinants of Health were addressed in provider documentation.  Please refer to patient history.    Discharge Needs Assessment  Concerns to be Addressed: financial/insurance, coping/stress, decision making, cognitive/perceptual, compliance issue    Clinical Risk Factors: Multiple Diagnoses (Chronic), Functional Limitations, Lives Alone or Absence of Caregiver to Assist with Discharge and Home Care    Barriers to taking medications: No    Prior overnight hospital stay or ED visit in last 90 days: Yes    Readmission Within the Last 30 Days: no previous admission in last 30 days         Anticipated Changes Related to Illness: inability to care for self    Equipment Needed After Discharge: none    Discharge Facility/Level of Care Needs:      Readmission  Risk of Unplanned Readmission Score:  %  Readmitted Within the Last 30 Days?   Patient at risk for readmission?: Yes    Discharge Plan  Screen findings are: Discharge planning needs identified or anticipated (Comment).(deferred to SW)    Expected Discharge Date: 07/30/18    Expected Transfer from Critical Care:  Patient and/or family were provided with choice of facilities / services that are available and appropriate to meet post hospital care needs?: No   List choices in order highest to lowest preferred, if applicable. : [facility] pending Medicaid reinstated    Initial Assessment complete?: Yes

## 2018-07-30 NOTE — Unmapped (Signed)
Hospitalist Daily Progress Note     LOS: 0 days under inpatient status    Assessment/Plan:  Principal Problem:    Social problem  Active Problems:    Asthma    Slow transit constipation    Dysphagia    Esophageal reflux    Hypothyroidism    Class 1 obesity in adult    Seizures (CMS-HCC)    Post traumatic stress disorder (PTSD)    Chronic tracheobronchitis (CMS-HCC)    Tracheostomy dependence (CMS-HCC)    VATER syndrome    Muscle spasms of both lower extremities    Enthesopathy of right hip region    Acne vulgaris    PEG (percutaneous endoscopic gastrostomy) status (CMS-HCC)    History of home ventilator (CMS-HCC)    Bipolar 1 disorder (CMS-HCC)    Esophageal achalasia    Cellulitis of left hand    Central sleep apnea  Resolved Problems:    * No resolved hospital problems. *         *L hand cellulitis.  Improving.  -Continue sulfa-TMP (started ~8/16).  ??  *Trach dependence.  Stable.  -Continue home arfomoterol, budesonide, TOBI nebs, 3% saline nebs.  -Continue azithro MWF.  -Continue Trilogy home vent qhs.  ??  *VATER syndrome w achalasia, esophageal stenosis.  Stable.  ??  *Bipolar.  Stable.  -Continue citalopram, mirtazapine, VPA.  ??  *Hypothyroid.  -Continue levothyroxine.  ??  *Acne.  -Continue home minocycline.  ??  *DVT prophylaxis.  -Ambulation.  ??  *Dispo.  Stepdown status due to qhs vent requirement.  Reported lapse in Medicaid that has led to loss of home nursing at night when he is sleeping and at risk of apnea.  SW helping me.  ??  ??  Please page the [facility]) pager at [phone] with questions.  ??  ??  Consultants: None        Subjective:   Promethazine x 1, oxycodone x 1, acetaminophen x 1 in last 24 hrs.  No SOB, chest pain, problems w L hand.  Slept well.      Objective:   Physical Exam:  General: Lying comfortably in bed, no acute distress.   Eyes: Anicteric sclerae, no conjunctival injection.    ENT: Mucous membranes moist.  Normal appearing ears, nose.   Respiratory: Normal respiratory effort.     Cardiovascular: No lower extremity edema.   Skin: No obvious rashes or breakdown.  Warm, dry.   Musculoskeletal: Motor strength in all extremities grossly intact.   Psychiatric: Alert.  Answers questions appropriately.  Judgment and insight intact.   Neurologic: Extraocular movements intact, no facial asymmetry.  No gross sensory deficits.         Vital signs in last 24 hours:  Temp:  [36.1 ??C (97 ??F)-36.9 ??C (98.5 ??F)] 36.1 ??C (97 ??F)  Heart Rate:  [72-112] 77  SpO2 Pulse:  [71-102] 77  Resp:  [11-22] 15  BP: (115-151)/(53-91) 120/68  MAP (mmHg):  [72-110] 87  FiO2 (%):  [28 %] 28 %  SpO2:  [94 %-100 %] 99 %    Intake/Output last 24 hours:    Intake/Output Summary (Last 24 hours) at 07/30/2018 0609  Last data filed at 07/30/2018 0400  Gross per 24 hour   Intake 1485 ml   Output 735 ml   Net 750 ml       Most recent recorded weights:  Last 5 Recorded Weights    07/29/18 0420   Weight: 96.1 kg (211 lb 12.8  oz)       Medications:   Scheduled Meds:  ??? albuterol  2.5 mg Nebulization 4x Daily (RT)   ??? arformoterol  15 mcg Nebulization BID   ??? azithromycin  500 mg G-tube Q MWF   ??? budesonide  1 mg Nebulization BID   ??? cetirizine  10 mg G-tube QPM   ??? cholecalciferol (vitamin D3)  5,000 Units Oral Daily   ??? citalopram  40 mg G-tube Nightly   ??? clindamycin   Topical BID   ??? esomeprazole  40 mg G-tube Daily   ??? fluticasone propionate  2 spray Each Nare Daily   ??? gabapentin  300 mg G-tube TID   ??? ipratropium  500 mcg Nebulization 4x Daily (RT)   ??? levothyroxine  75 mcg G-tube Daily   ??? LORazepam  0.5 mg Oral BID   ??? minocycline  100 mg G-tube Nightly   ??? mirtazapine  15 mg G-tube Nightly   ??? montelukast  10 mg Oral Nightly   ??? mupirocin   Topical TID   ??? sodium chloride  4 mL Nebulization 4x Daily (RT)   ??? sulfamethoxazole-trimethoprim  1 tablet Oral Q12H [facility]   ??? tobramycin (PF)  300 mg Nebulization BID (RT)   ??? triamcinolone   Topical BID   ??? valproate  250 mg G-tube Q AM   ??? valproate  750 mg Oral Nightly     Continuous Infusions:  ??? lactated Ringers 30 mL/hr (07/29/18 1231)

## 2018-07-30 NOTE — Unmapped (Signed)
Antibiotic Timeout Checklist  Indication for antibiotics: Cellulitis  Antibiotic Start Date: 08/21  Current systemic antibiotics: Bactrim  Microbiology Results: N/A  Sensitivities Available? N/A  Are antibiotics still indicated? YES  Is it appropriate to de-escalate? NO  Is it appropriate to convert to PO therapy? NO  Today's antibiotic plan: No change  Planned Antibiotic Duration: TBD

## 2018-07-30 NOTE — Unmapped (Signed)
Pt stable, no acute distress today. UOP adequate,regular diet. Meds only through [facility] port/GI port. Will continue to monitor. Patient applies his medication creams on self.     Problem: Adult Inpatient Plan of Care  Goal: Plan of Care Review  Outcome: Ongoing - Unchanged  Goal: Patient-Specific Goal (Individualization)  Outcome: Ongoing - Unchanged  Goal: Absence of Hospital-Acquired Illness or Injury  Outcome: Ongoing - Unchanged  Goal: Optimal Comfort and Wellbeing  Outcome: Ongoing - Unchanged  Goal: Readiness for Transition of Care  Outcome: Ongoing - Unchanged  Goal: Rounds/Family Conference  Outcome: Ongoing - Unchanged

## 2018-07-30 NOTE — Unmapped (Signed)
Patient received on room air then placed trach collar to do scheduled treatments as ordered.Patient remained and received on current vent settings with no complications.Patient trach is midline and secure with minimal secretions.

## 2018-07-30 NOTE — Unmapped (Signed)
Pt stable, no acute events overnight.   Neuro: AOx4. CAM-ICU-. PERRL. X1 dose of prn PO tylenol.   CV: SD status. HR 70-80's.  BP 120-150's. Afebrile. Pulses palpable.   Resp: #6 [place] uncuffed trach. HTC during the day, vent @ night.  No desaturations noted.   GI/GU: Voids in urinal, UOP adequate. BM overnight. Regular diet. Po intake adequate.   Lines: Accessed R chest port. LR KVO.   Wounds: L hand wound, cellulitic.   Pt safe/free from falls.       Problem: Adult Inpatient Plan of Care  Goal: Plan of Care Review  Outcome: Progressing  Goal: Patient-Specific Goal (Individualization)  Outcome: Progressing  Goal: Absence of Hospital-Acquired Illness or Injury  Outcome: Progressing  Goal: Optimal Comfort and Wellbeing  Outcome: Progressing  Goal: Readiness for Transition of Care  Outcome: Progressing  Goal: Rounds/Family Conference  Outcome: Progressing

## 2018-07-31 NOTE — Unmapped (Signed)
Hospitalist Daily Progress Note     LOS: 0 days under inpatient status    Assessment/Plan:  Principal Problem:    Social problem  Active Problems:    Asthma    Slow transit constipation    Dysphagia    Esophageal reflux    Hypothyroidism    Class 1 obesity in adult    Seizures (CMS-HCC)    Post traumatic stress disorder (PTSD)    Chronic tracheobronchitis (CMS-HCC)    Tracheostomy dependence (CMS-HCC)    VATER syndrome    Muscle spasms of both lower extremities    Enthesopathy of right hip region    Acne vulgaris    PEG (percutaneous endoscopic gastrostomy) status (CMS-HCC)    History of home ventilator (CMS-HCC)    Bipolar 1 disorder (CMS-HCC)    Esophageal achalasia    Cellulitis of left hand    Central sleep apnea  Resolved Problems:    * No resolved hospital problems. *         *Trach dependence. ??Stable.  -Continue home arfomoterol, budesonide, TOBI nebs, 3% saline nebs.  -Continue azithro MWF.  -Continue Trilogy home vent qhs.    *VATER syndrome w achalasia, esophageal stenosis. ??Stable.    *L hand cellulitis.  Resolved.  (Sulfa-TMP 8/16-8/23)  ??  *Bipolar. ??Stable.  -Continue citalopram, mirtazapine, VPA.  ??  *Hypothyroid.  -Continue levothyroxine.  ??  *Acne.  -Continue home minocycline.  ??  *DVT prophylaxis.  -Ambulation.  ??  *Dispo.????Stepdown status due to qhs vent requirement.  Lapse in Medicaid that has led to loss of required home nursing at night due to need for Trilogy vent and trach.  Though he does have a roommate, that roommate is reportedly not there every day, and he has no other family in the area who can stay w him at night. ??SW assisting.  ??  ??  Please page the??[facility]) pager at [phone] with questions.  ??  ??  Consultants:??None         Subjective:   Ondansetron x 1 in last 24 hrs.  No headaches, trouble breathing, significant pain in L hand.      Objective:   Physical Exam:  General: Lying comfortably in bed, no acute distress.   Eyes: Anicteric sclerae, no conjunctival injection.     ENT: Mucous membranes moist.     Neck: Trach in place.   Respiratory: Normal respiratory effort.     Cardiovascular: No lower extremity edema.   Gastrointestinal: Nondistended.   Skin: L hand w stable ulcerative lesion w only mild surrounding erythema.  Warm, dry.   Musculoskeletal: Motor strength in all extremities grossly intact.  Walking around.   Psychiatric: Alert, oriented.  Answers questions appropriately.  Judgment and insight intact.   Neurologic: Extraocular movements intact, no facial asymmetry.  No gross sensory deficits.         Vital signs in last 24 hours:  Temp:  [35 ??C (95 ??F)-36.8 ??C (98.2 ??F)] 35 ??C (95 ??F)  Heart Rate:  [66-123] 80  SpO2 Pulse:  [80-116] 80  Resp:  [11-28] 12  BP: (103-162)/(57-87) 103/57  MAP (mmHg):  [71-107] 71  FiO2 (%):  [28 %] 28 %  SpO2:  [96 %-99 %] 98 %    Intake/Output last 24 hours:    Intake/Output Summary (Last 24 hours) at 07/31/2018 0601  Last data filed at 07/31/2018 0400  Gross per 24 hour   Intake 2350 ml   Output 550 ml   Net  1800 ml       Most recent recorded weights:  Last 5 Recorded Weights    07/29/18 0420   Weight: 96.1 kg (211 lb 12.8 oz)       Medications:   Scheduled Meds:  ??? albuterol  2.5 mg Nebulization 4x Daily (RT)   ??? arformoterol  15 mcg Nebulization BID   ??? azithromycin  500 mg G-tube Q MWF   ??? budesonide  1 mg Nebulization BID   ??? cetirizine  10 mg G-tube QPM   ??? cholecalciferol (vitamin D3)  5,000 Units Oral Daily   ??? citalopram  40 mg G-tube Nightly   ??? clindamycin   Topical BID   ??? esomeprazole  40 mg G-tube Daily   ??? fluticasone propionate  2 spray Each Nare Daily   ??? gabapentin  300 mg G-tube TID   ??? ipratropium  500 mcg Nebulization 4x Daily (RT)   ??? levothyroxine  75 mcg G-tube Daily   ??? LORazepam  0.5 mg Oral BID   ??? minocycline  100 mg G-tube Nightly   ??? mirtazapine  15 mg G-tube Nightly   ??? montelukast  10 mg Oral Nightly   ??? mupirocin   Topical TID   ??? sodium chloride  4 mL Nebulization 4x Daily (RT)   ??? sulfamethoxazole-trimethoprim  1 tablet Oral Q12H [facility]   ??? tobramycin (PF)  300 mg Nebulization BID (RT)   ??? triamcinolone   Topical BID   ??? valproate  250 mg G-tube Q AM   ??? valproate  750 mg Oral Nightly     Continuous Infusions:  ??? lactated Ringers 30 mL/hr (07/29/18 1231)

## 2018-07-31 NOTE — Unmapped (Signed)
Problem: Adult Inpatient Plan of Care  Goal: Plan of Care Review  Outcome: Ongoing - Unchanged  Flowsheets (Taken 07/31/2018 0139)  Progress: no change  Plan of Care Reviewed With: patient  Note:   Pt remains in hospital awaiting medicaid/insurance  Goal: Patient-Specific Goal (Individualization)  Outcome: Ongoing - Unchanged  Flowsheets (Taken 07/31/2018 0139)  Patient-Specific Goals (Include Timeframe): Sleep  Individualized Care Needs: Care clustered, pt requesting additional anti-nausea med, MD notified, Ondansetron given  Anxieties, Fears or Concerns: Pt denies anxiety  Goal: Absence of Hospital-Acquired Illness or Injury  Outcome: Ongoing - Unchanged  Intervention: Identify and Manage Fall Risk  Flowsheets (Taken 07/30/2018 2000)  Safety Interventions: aspiration precautions;commode/urinal/bedpan at bedside;environmental modification;infection management;lighting adjusted for tasks/safety;low bed  Intervention: Prevent Skin Injury  Flowsheets (Taken 07/30/2018 0800 by Rise Patience, RN)  Pressure Reduction Techniques: frequent weight shift encouraged  Intervention: Prevent VTE (venous thromboembolism)  Flowsheets (Taken 07/31/2018 0139)  VTE Prevention/Management: ambulation promoted; bleeding precautions maintained; dorsiflexion/plantar flexion performed; fluids promoted  Intervention: Prevent Infection  Flowsheets (Taken 07/30/2018 2000)  Infection Prevention: cohorting utilized;environmental surveillance performed;equipment surfaces disinfected;handwashing promoted;personal protective equipment utilized;rest/sleep promoted;single patient room provided  Goal: Optimal Comfort and Wellbeing  Outcome: Ongoing - Unchanged  Intervention: Monitor Pain and Promote Comfort  Flowsheets (Taken 07/31/2018 0139)  Pain Management Interventions: care clustered; pain management plan reviewed with patient/caregiver  Intervention: Provide Person-Centered Care  Flowsheets (Taken 07/31/2018 0139)  Trust Relationship/Rapport: care explained; choices provided; empathic listening provided; questions answered; questions encouraged; thoughts/feelings acknowledged; emotional support provided; reassurance provided  Goal: Readiness for Transition of Care  Outcome: Ongoing - Unchanged  Intervention: Mutually Develop Transition Plan  Flowsheets (Taken 07/29/2018 1311 by Joni Fears, RRT)  Equipment Needed After Discharge: none  Equipment Currently Used at Home: other (see comments);respiratory supplies  Anticipated Changes Related to Illness: inability to care for self  Concerns to be Addressed: adjustment to diagnosis/illness;cognitive/perceptual;coping/stress;decision making;discharge planning;financial/insurance  Readmission Within the Last 30 Days: no previous admission in last 30 days  Goal: Rounds/Family Conference  Outcome: Ongoing - Unchanged  Flowsheets (Taken 07/31/2018 0139)  Participants: nursing; patient  Note:   PER rounds performed at bedside by RN's during change of shift report & rounds. POC reviewed with pt.

## 2018-07-31 NOTE — Unmapped (Signed)
7a shift note:  pt remains stepdown status, ambulatory in room, gait steady.  Pt using vent at night, and on room air during the day.  Reviewed pain goals with pt this shift, pt reports getting relief with use of tylenol for hand pain from what he thinks was  a bug bite.  Pt appetite good.  Eating meals from [employer] and calling from [employer] as well for additional food.  Heparin locked port this shift per md order.  Voiding without problems in urinal.  Pt taking meds via Gtube.    Problem: Adult Inpatient Plan of Care  Goal: Plan of Care Review  Outcome: Ongoing - Unchanged  Goal: Patient-Specific Goal (Individualization)  Outcome: Ongoing - Unchanged  Flowsheets (Taken 07/31/2018 1219)  Individualized Care Needs: pt enjoys off unit passes to get food from [employer].  Goal: Absence of Hospital-Acquired Illness or Injury  Outcome: Ongoing - Unchanged  Goal: Optimal Comfort and Wellbeing  Outcome: Ongoing - Unchanged  Goal: Readiness for Transition of Care  Outcome: Ongoing - Unchanged  Goal: Rounds/Family Conference  Outcome: Ongoing - Unchanged     Problem: Asthma Comorbidity  Goal: Maintenance of Asthma Control  Outcome: Ongoing - Unchanged     Problem: Obstructive Sleep Apnea Risk or Actual (Comorbidity Management)  Goal: Unobstructed Breathing During Sleep  Outcome: Ongoing - Unchanged     Problem: Communication Impairment (Mechanical Ventilation, Invasive)  Goal: Effective Communication  Outcome: Ongoing - Unchanged     Problem: Skin and Tissue Injury (Mechanical Ventilation, Invasive)  Goal: Absence of Device-Related Skin and Tissue Injury  Outcome: Ongoing - Unchanged     Problem: Ventilator-Induced Lung Injury (Mechanical Ventilation, Invasive)  Goal: Absence of Ventilator-Induced Lung Injury  Outcome: Ongoing - Unchanged

## 2018-08-01 NOTE — Unmapped (Signed)
7a Shift Note:  Pt remains stepdown status.  Pleasant and cooperative with plan of care.  Sleeps most of day, does awaken and is independent in most ADLs .  Pt  ambulates in room and off unit.  No complaints verbalized this shift.  Pt on vent at night, back on room air for most of shift.       Problem: Adult Inpatient Plan of Care  Goal: Plan of Care Review  Outcome: Ongoing - Unchanged  Goal: Patient-Specific Goal (Individualization)  Outcome: Ongoing - Unchanged  Flowsheets (Taken 08/01/2018 1320)  Patient-Specific Goals (Include Timeframe): pt wants to get a shower today, encouraged him to notify RN when he is ready for shower, he agreed.  Goal: Absence of Hospital-Acquired Illness or Injury  Outcome: Ongoing - Unchanged  Goal: Optimal Comfort and Wellbeing  Outcome: Ongoing - Unchanged  Goal: Readiness for Transition of Care  Outcome: Ongoing - Unchanged  Goal: Rounds/Family Conference  Outcome: Ongoing - Unchanged     Problem: Asthma Comorbidity  Goal: Maintenance of Asthma Control  Outcome: Ongoing - Unchanged     Problem: Obstructive Sleep Apnea Risk or Actual (Comorbidity Management)  Goal: Unobstructed Breathing During Sleep  Outcome: Ongoing - Unchanged     Problem: Communication Impairment (Mechanical Ventilation, Invasive)  Goal: Effective Communication  Outcome: Ongoing - Unchanged     Problem: Skin and Tissue Injury (Mechanical Ventilation, Invasive)  Goal: Absence of Device-Related Skin and Tissue Injury  Outcome: Ongoing - Unchanged     Problem: Ventilator-Induced Lung Injury (Mechanical Ventilation, Invasive)  Goal: Absence of Ventilator-Induced Lung Injury  Outcome: Ongoing - Unchanged

## 2018-08-01 NOTE — Unmapped (Signed)
Hospital Medicine Daily Progress Note    Assessment/Plan:  Mr. Selner is a 27 YO WM with a PMH significant for VATER syndrome associated tracheobronchomalacia s/p trach/PEG and nocturnal mechanical ventilation complicated by frequent admissions for tracheobronchitis, asthma, central sleep apnea, and seizures who presented to the [facility] ED on 07/28/2018 at the behest of his PCP for consideration of admission for IV antibiotics to treat a left hand cellulitis.  On further interview at presentation, however, one of the main concerns was that his Medicaid had lapsed and this resulted in a gap in his at-home nursing care (required for his nightly ventilation).    VATER syndrome-associated tracheobronchomalacia s/p trach/PEG and nocturnal mechanical ventilation: Stable.  His Medicaid apparently lapsed and this has resulted in his inability to have necessary at-home nursing care available at night for his Trilogy ventilation.   - Continue home arfomoterol, budesonide, TOBI nebs, 3% saline nebs, A/A nebs Q4h   - Continue home azithromycin M-W-F   - Continue Trilogy home vent nightly  ??  Type II achalasia and esophageal stenosis secondary to VATER syndrome: His last EGD was in 05/2018 which found to have esophageal stenosis (s/p balloon dilation) and scalloped mucosa, biopsied. Path results showed duodenal mucosa with intact villous architecture??and no increased intraepithelial lymphocytes identified.    - Continue outpatient follow-up with Dr. Wilford Corner in GI medicine clinic   - Continue home PPI and gabapentin     Bipolar 1 disorder; PTSD; anxiety:??He has followed with [facility] and undergone psychotherapy previously.??   -??Continue home citalopram, valproate, and mirtazapine??   - Stop scheduled lorazepam 0.5 mg PO BID   - Change lorazepam 1 mg PO daily prn to 0.5 mg PO BID prn    Left hand cellulitis: Resolved s/p a course of empiric bactrim (07/23/2018 to 07/30/2018).  ??  Hypothyroidism:   - Continue home levothyroxine Acne:   - Continue home minocycline  ??  Allergic asthma:    - Continue home montelukast, flonase, zyrtec, budesonide nebs, arformoterol ([place]) BID  ??  Slow transit constipation:   - Continue linaclotide, bentyl QID prn  ????  Seizure disorder:    - Continue home valproate  ??  Lower extremity muscle spasms:    - Continue cyclobenzaprine prn    Checklist:  Antibiotics: Azithromycin, minocycline  Steroids: None  Prophylaxis: Ambulation  Diet: Regular  Code status: Full  Dispo:??Stepdown status due to??qhs??vent requirement.  Lapse in Medicaid that has led to loss of required home nursing at night due to need for Trilogy vent and trach.  Though he does have a roommate, that roommate is reportedly not there every day, and he has no other family in the area who can stay w him at night. ??SW assisting.    Floor time 40 minutes, > 50% spent in counseling and coordination of care about the following issues:  respiratory status, medication review, discussions with patient, nurse.  ___________________________________________________________________    Subjective:  No issues overnight.     All 10 systems reviewed and negative except as mentioned above.      Labs/Studies:  None    Objective:  Temp:  [35.9 ??C (96.6 ??F)-37.1 ??C (98.8 ??F)] 36.8 ??C (98.3 ??F)  Heart Rate:  [68-113] 91  SpO2 Pulse:  [68-111] 84  Resp:  [11-25] 16  BP: (118-168)/(44-99) 152/99  FiO2 (%):  [28 %] 28 %  SpO2:  [96 %-99 %] 98 %    General: NAD, sleeping but wakes to auditory stimuli  HEENT: [place]/AT, EOMI  Neck: Supple, trach in place  Cardiovascular: RRR, normal S1/S2  Pulmonary: CTAB  Abdomen: Soft, non-tender, non-distended, NABS   Ext: Warm and well-perfused, left hand with stable ulcerative lesion with minimal surrounding erythema  Neuro: Alert and oriented to person / place / time, cranial nerves 2-12 grossly intact, no focal deficits  Psych: Cooperative

## 2018-08-01 NOTE — Unmapped (Signed)
Problem: Adult Inpatient Plan of Care  Goal: Plan of Care Review  Outcome: Ongoing - Unchanged  Flowsheets  Taken 08/01/2018 0258  Progress: no change  Taken 07/31/2018 0139  Plan of Care Reviewed With: patient  Goal: Patient-Specific Goal (Individualization)  Outcome: Ongoing - Unchanged  Flowsheets  Taken 07/31/2018 0139  Patient-Specific Goals (Include Timeframe): Sleep  Anxieties, Fears or Concerns: Pt denies anxiety  Taken 08/01/2018 0258  Individualized Care Needs: Care clustered, SMOG enema as requested, sleep/ rest pattern supported  Goal: Absence of Hospital-Acquired Illness or Injury  Outcome: Ongoing - Unchanged  Intervention: Identify and Manage Fall Risk  Flowsheets (Taken 07/31/2018 2000)  Safety Interventions: aspiration precautions;commode/urinal/bedpan at bedside;environmental modification;infection management;lighting adjusted for tasks/safety;low bed;nonskid shoes/slippers when out of bed;room near unit station  Intervention: Prevent Skin Injury  Flowsheets (Taken 07/31/2018 2000)  Pressure Reduction Techniques: frequent weight shift encouraged  Intervention: Prevent VTE (venous thromboembolism)  Flowsheets (Taken 07/31/2018 0139)  VTE Prevention/Management: ambulation promoted;bleeding precautions maintained;dorsiflexion/plantar flexion performed;fluids promoted  Intervention: Prevent Infection  Flowsheets (Taken 07/31/2018 2000)  Infection Prevention: cohorting utilized;environmental surveillance performed;equipment surfaces disinfected;handwashing promoted;personal protective equipment utilized;rest/sleep promoted;single patient room provided  Goal: Optimal Comfort and Wellbeing  Outcome: Ongoing - Unchanged  Intervention: Monitor Pain and Promote Comfort  Flowsheets (Taken 07/31/2018 0139)  Pain Management Interventions: care clustered;pain management plan reviewed with patient/caregiver  Intervention: Provide Person-Centered Care  Flowsheets (Taken 07/31/2018 0139)  Trust Relationship/Rapport: care explained;choices provided;empathic listening provided;questions answered;questions encouraged;thoughts/feelings acknowledged;emotional support provided;reassurance provided  Goal: Readiness for Transition of Care  Outcome: Ongoing - Unchanged  Intervention: Mutually Develop Transition Plan  Flowsheets (Taken 07/29/2018 1311 by Joni Fears, RRT)  Equipment Currently Used at Home: other (see comments);respiratory supplies  Concerns to be Addressed: adjustment to diagnosis/illness;cognitive/perceptual;coping/stress;decision making;discharge planning;financial/insurance  Readmission Within the Last 30 Days: no previous admission in last 30 days  Note:   Pt awaiting insurance to return home  Goal: Rounds/Family Conference  Outcome: Ongoing - Unchanged  Flowsheets (Taken 07/31/2018 0139)  Participants: nursing;patient  Note:   PER rounds with nursing, patient. POC reviewed, ongoing monitoring and assessments reviewed. Pt verbalizes understanding of POC     Problem: Asthma Comorbidity  Goal: Maintenance of Asthma Control  Outcome: Ongoing - Unchanged  Intervention: Maintain Asthma Symptom Control  Flowsheets (Taken 08/01/2018 0258)  Medication Review/Management: medications reviewed     Problem: Obstructive Sleep Apnea Risk or Actual (Comorbidity Management)  Goal: Unobstructed Breathing During Sleep  Outcome: Ongoing - Unchanged     Problem: Communication Impairment (Mechanical Ventilation, Invasive)  Goal: Effective Communication  Outcome: Ongoing - Unchanged  Intervention: Ensure Effective Communication  Flowsheets (Taken 08/01/2018 0258)  Communication Enhancement Strategies: call light answered in person; speaking valve use encouraged     Problem: Skin and Tissue Injury (Mechanical Ventilation, Invasive)  Goal: Absence of Device-Related Skin and Tissue Injury  Outcome: Ongoing - Unchanged  Intervention: Maintain Skin and Tissue Health  Flowsheets (Taken 07/31/2018 2000)  Device Skin Pressure Protection: absorbent pad utilized/changed;adhesive use limited     Problem: Ventilator-Induced Lung Injury (Mechanical Ventilation, Invasive)  Goal: Absence of Ventilator-Induced Lung Injury  Outcome: Ongoing - Unchanged

## 2018-08-02 LAB — CBC W/ AUTO DIFF
BASOPHILS ABSOLUTE COUNT: 0 10*9/L (ref 0.0–0.1)
EOSINOPHILS ABSOLUTE COUNT: 0.1 10*9/L (ref 0.0–0.4)
EOSINOPHILS RELATIVE PERCENT: 1.7 %
HEMATOCRIT: 37.5 % — ABNORMAL LOW (ref 41.0–53.0)
HEMOGLOBIN: 12.2 g/dL — ABNORMAL LOW (ref 13.5–17.5)
LARGE UNSTAINED CELLS: 2 % (ref 0–4)
LYMPHOCYTES ABSOLUTE COUNT: 1.9 10*9/L (ref 1.5–5.0)
MEAN CORPUSCULAR HEMOGLOBIN CONC: 32.4 g/dL (ref 31.0–37.0)
MEAN CORPUSCULAR HEMOGLOBIN: 24.7 pg — ABNORMAL LOW (ref 26.0–34.0)
MEAN CORPUSCULAR VOLUME: 76.2 fL — ABNORMAL LOW (ref 80.0–100.0)
MONOCYTES ABSOLUTE COUNT: 0.5 10*9/L (ref 0.2–0.8)
MONOCYTES RELATIVE PERCENT: 5.6 %
NEUTROPHILS ABSOLUTE COUNT: 5.5 10*9/L (ref 2.0–7.5)
NEUTROPHILS RELATIVE PERCENT: 67.3 %
PLATELET COUNT: 367 10*9/L (ref 150–440)
RED BLOOD CELL COUNT: 4.93 10*12/L (ref 4.50–5.90)
RED CELL DISTRIBUTION WIDTH: 16.6 % — ABNORMAL HIGH (ref 12.0–15.0)
WBC ADJUSTED: 8.1 10*9/L (ref 4.5–11.0)

## 2018-08-02 LAB — BASIC METABOLIC PANEL
ANION GAP: 11 mmol/L (ref 9–15)
BLOOD UREA NITROGEN: 8 mg/dL (ref 7–21)
BUN / CREAT RATIO: 11
CALCIUM: 9.8 mg/dL (ref 8.5–10.2)
CHLORIDE: 99 mmol/L (ref 98–107)
CO2: 31 mmol/L — ABNORMAL HIGH (ref 22.0–30.0)
EGFR CKD-EPI NON-AA MALE: >90 mL/min/{1.73_m2} (ref >=60)
GLUCOSE RANDOM: 214 mg/dL — ABNORMAL HIGH (ref 65–179)
POTASSIUM: 4.6 mmol/L (ref 3.5–5.0)
SODIUM: 141 mmol/L (ref 135–145)

## 2018-08-02 LAB — MAGNESIUM: Magnesium:MCnc:Pt:Ser/Plas:Qn:: 1.6

## 2018-08-02 LAB — POTASSIUM: Potassium:SCnc:Pt:Ser/Plas:Qn:: 4.6

## 2018-08-02 LAB — MICROCYTES

## 2018-08-02 NOTE — Unmapped (Signed)
Problem: Asthma Comorbidity  Goal: Maintenance of Asthma Control  Outcome: Ongoing - Unchanged     Problem: Obstructive Sleep Apnea Risk or Actual (Comorbidity Management)  Goal: Unobstructed Breathing During Sleep  Outcome: Ongoing - Unchanged     Problem: Communication Impairment (Mechanical Ventilation, Invasive)  Goal: Effective Communication  Outcome: Resolved  Intervention: Ensure Effective Communication  Flowsheets (Taken 08/01/2018 0258 by Althea Grimmer, RN)  Communication Enhancement Strategies: call light answered in person;speaking valve use encouraged     No medical problems at this time.

## 2018-08-02 NOTE — Unmapped (Signed)
Problem: Adult Inpatient Plan of Care  Goal: Plan of Care Review  Outcome: Ongoing - Unchanged  Flowsheets  Taken 08/01/2018 0258  Progress: no change  Taken 07/31/2018 0139  Plan of Care Reviewed With: patient  Goal: Patient-Specific Goal (Individualization)  Outcome: Ongoing - Unchanged  Flowsheets  Taken 08/02/2018 0230  Patient-Specific Goals (Include Timeframe): I need to get some sleep  Individualized Care Needs: Care clustered, tap water enema as requested, sleep/rest pattern supported  Taken 07/31/2018 0139  Anxieties, Fears or Concerns: Pt denies anxiety  Goal: Absence of Hospital-Acquired Illness or Injury  Outcome: Ongoing - Unchanged  Intervention: Identify and Manage Fall Risk  Flowsheets (Taken 08/01/2018 2000)  Safety Interventions: aspiration precautions;bleeding precautions;environmental modification;infection management;lighting adjusted for tasks/safety;low bed;commode/urinal/bedpan at bedside  Intervention: Prevent Skin Injury  Flowsheets (Taken 07/31/2018 2000)  Pressure Reduction Techniques: frequent weight shift encouraged  Intervention: Prevent VTE (venous thromboembolism)  Flowsheets (Taken 07/31/2018 0139)  VTE Prevention/Management: ambulation promoted;bleeding precautions maintained;dorsiflexion/plantar flexion performed;fluids promoted  Intervention: Prevent Infection  Flowsheets (Taken 08/01/2018 2000)  Infection Prevention: cohorting utilized;environmental surveillance performed;equipment surfaces disinfected;handwashing promoted;personal protective equipment utilized;rest/sleep promoted;single patient room provided  Goal: Optimal Comfort and Wellbeing  Outcome: Ongoing - Unchanged  Intervention: Monitor Pain and Promote Comfort  Flowsheets (Taken 07/31/2018 0139)  Pain Management Interventions: care clustered;pain management plan reviewed with patient/caregiver  Intervention: Provide Person-Centered Care  Flowsheets (Taken 07/31/2018 0139)  Trust Relationship/Rapport: care explained;choices provided;empathic listening provided;questions answered;questions encouraged;thoughts/feelings acknowledged;emotional support provided;reassurance provided  Goal: Readiness for Transition of Care  Outcome: Ongoing - Unchanged  Intervention: Mutually Develop Transition Plan  Flowsheets  Taken 07/29/2018 1311 by Joni Fears, RRT  Equipment Currently Used at Home: other (see comments);respiratory supplies  Anticipated Changes Related to Illness: inability to care for self  Concerns to be Addressed: adjustment to diagnosis/illness;cognitive/perceptual;coping/stress;decision making;discharge planning;financial/insurance  Taken 08/02/2018 0230 by Althea Grimmer, RN  Patient/Family Anticipated Services at Transition: case manager;durable medical equipment;home health care  Goal: Rounds/Family Conference  Outcome: Ongoing - Unchanged  Flowsheets (Taken 07/31/2018 0139)  Participants: nursing;patient  Note:   PER rounds performed at bedside, POC reviewed with pt.     Problem: Asthma Comorbidity  Goal: Maintenance of Asthma Control  Outcome: Ongoing - Unchanged  Intervention: Maintain Asthma Symptom Control  Flowsheets (Taken 08/01/2018 0258)  Medication Review/Management: medications reviewed     Problem: Obstructive Sleep Apnea Risk or Actual (Comorbidity Management)  Goal: Unobstructed Breathing During Sleep  Outcome: Ongoing - Unchanged  Intervention: Monitor and Manage Obstructive Sleep Apnea  Flowsheets (Taken 08/02/2018 0230)  NPPV/CPAP Maintenance: tubes secured  Note:   Pt remains on monitor, on home vent/home vent settings while asleep.     Problem: Communication Impairment (Mechanical Ventilation, Invasive)  Goal: Effective Communication  Outcome: Ongoing - Unchanged  Intervention: Ensure Effective Communication  Flowsheets (Taken 08/01/2018 0258)  Communication Enhancement Strategies: call light answered in person;speaking valve use encouraged     Problem: Skin and Tissue Injury (Mechanical Ventilation, Invasive) Goal: Absence of Device-Related Skin and Tissue Injury  Outcome: Ongoing - Unchanged     Problem: Ventilator-Induced Lung Injury (Mechanical Ventilation, Invasive)  Goal: Absence of Ventilator-Induced Lung Injury  Outcome: Ongoing - Unchanged  Intervention: Prevent Ventilator-Associated Pneumonia  Flowsheets (Taken 08/02/2018 0000)  Head of Bed (HOB): HOB flat

## 2018-08-02 NOTE — Unmapped (Signed)
Trach care done and afternoon breathing treatments given to pt. Pt tolerated well. RT will continue to monitor.

## 2018-08-02 NOTE — Unmapped (Signed)
Patient received on room air and was given all scheduled meds as ordered.Patient trach is midline and secure with no secretions.Patient placed on trilogy with previous settings at night as ordered.

## 2018-08-02 NOTE — Unmapped (Signed)
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  On further interview at presentation, however, one of the main concerns was that his Medicaid had lapsed and this resulted in a gap in his at-home nursing care (required for his nightly ventilation).    VATER syndrome-associated tracheobronchomalacia s/p trach/PEG and nocturnal mechanical ventilation: Stable.  His Medicaid apparently lapsed and this has resulted in his inability to have necessary at-home nursing care available at night for his Trilogy ventilation.   - Continue home arfomoterol, budesonide, TOBI nebs, 3% saline nebs, A/A nebs Q4h   - Continue home azithromycin M-W-F   - Continue Trilogy home vent nightly  ??  Type II achalasia and esophageal stenosis secondary to VATER syndrome: His last EGD was in 05/2018 which found to have esophageal stenosis (s/p balloon dilation) and scalloped mucosa, biopsied. Path results showed duodenal mucosa with intact villous architecture??and no increased intraepithelial lymphocytes identified.    - Continue outpatient follow-up with Dr. Wilford Corner in GI medicine clinic   - Continue home PPI and gabapentin     Bipolar 1 disorder; PTSD; anxiety:??He has followed with [facility] and undergone psychotherapy previously.??   -??Continue home citalopram, valproate, and mirtazapine??   - Continue lorazepam 0.5 mg PO BID prn    Left hand cellulitis: Resolved s/p a course of empiric bactrim (07/23/2018 to 07/30/2018).  ??  Hypothyroidism:   - Continue home levothyroxine  ??  Acne:   - Continue home minocycline  ??  Allergic asthma:    - Continue home montelukast, flonase, zyrtec, budesonide nebs, arformoterol (Brovana) BID  ??  Slow transit constipation:   - Continue linaclotide, bentyl QID prn  ????  Seizure disorder:    - Continue home valproate  ??  Lower extremity muscle spasms:    - Continue cyclobenzaprine prn    Checklist:  Antibiotics: Azithromycin, minocycline  Steroids: None  Prophylaxis: Ambulation  Diet: Regular  Code status: Full  Dispo:??Stepdown status due to??qhs??vent requirement.  Lapse in Medicaid that has led to loss of required home nursing at night due to need for Trilogy vent and trach.  Though he does have a roommate, that roommate is reportedly not there every day, and he has no other family in the area who can stay with him at night. ??SW assisting.    Floor time 35 minutes minutes, > 50% spent in counseling and coordination of care about the following issues:  disposition, discussions with patient, SW.  ___________________________________________________________________    Subjective:  Cameron Baker remains stable this morning    All 10 systems reviewed and negative except as mentioned above.      Labs/Studies:  Reviewed    Objective:  Temp:  [36 ??C (96.8 ??F)-37.3 ??C (99.1 ??F)] 36.8 ??C (98.3 ??F)  Heart Rate:  [78-116] 91  SpO2 Pulse:  [86-110] 93  Resp:  [12-22] 13  BP: (113-159)/(53-79) 116/65  FiO2 (%):  [28 %] 28 %  SpO2:  [95 %-100 %] 100 %    General: NAD  HEENT: [place]/AT, EOMI  Neck: Supple, trach in place  Cardiovascular: RRR, normal S1/S2  Pulmonary: CTAB  Abdomen: Soft, non-tender, non-distended,  NABS, G-tube site c/d/i with some chronic surrounding erythema  Neuro: Alert and oriented to person / place / time  Psych: Cooperative

## 2018-08-03 NOTE — Unmapped (Signed)
No acute events.  Pt stable.  Pt on vent overnight.  Pt independent.  No falls.  Ambulates in hallway and in room ad lib.     Problem: Adult Inpatient Plan of Care  Goal: Plan of Care Review  Outcome: Progressing  Goal: Patient-Specific Goal (Individualization)  Outcome: Progressing  Goal: Absence of Hospital-Acquired Illness or Injury  Outcome: Progressing  Goal: Optimal Comfort and Wellbeing  Outcome: Progressing  Goal: Readiness for Transition of Care  Outcome: Progressing  Goal: Rounds/Family Conference  Outcome: Progressing     Problem: Asthma Comorbidity  Goal: Maintenance of Asthma Control  Outcome: Progressing     Problem: Obstructive Sleep Apnea Risk or Actual (Comorbidity Management)  Goal: Unobstructed Breathing During Sleep  Outcome: Progressing     Problem: Skin and Tissue Injury (Mechanical Ventilation, Invasive)  Goal: Absence of Device-Related Skin and Tissue Injury  Outcome: Progressing     Problem: Ventilator-Induced Lung Injury (Mechanical Ventilation, Invasive)  Goal: Absence of Ventilator-Induced Lung Injury  Outcome: Progressing

## 2018-08-03 NOTE — Unmapped (Signed)
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  On further interview at presentation, however, one of the main concerns was that his Medicaid had lapsed and this resulted in a gap in his at-home nursing care (required for his nightly ventilation).    VATER syndrome-associated tracheobronchomalacia s/p trach/PEG and nocturnal mechanical ventilation: Stable.  His Medicaid apparently lapsed and this has resulted in his inability to have necessary at-home nursing care available at night for his Trilogy ventilation.   - Continue home arfomoterol, budesonide, TOBI nebs, 3% saline nebs, A/A nebs Q4h   - Continue home azithromycin M-W-F   - Continue Trilogy home vent nightly  ??  Type II achalasia and esophageal stenosis secondary to VATER syndrome: His last EGD was in 05/2018 which found to have esophageal stenosis (s/p balloon dilation) and scalloped mucosa, biopsied. Path results showed duodenal mucosa with intact villous architecture??and no increased intraepithelial lymphocytes identified  He complained of some mild dysphagia on 08/02/2018, concerning for recurrent symptomatic stenosis.     - GI medicine consulted for consideration of EGD while hospitalized   - Continue outpatient follow-up with Dr. Wilford Corner in GI medicine clinic   - Continue home PPI and gabapentin     Bipolar 1 disorder; PTSD; anxiety:??He has followed with [facility] and undergone psychotherapy previously.??   -??Continue home citalopram, valproate, and mirtazapine??   - Continue lorazepam 0.5 mg PO BID prn    Left hand cellulitis: Resolved s/p a course of empiric bactrim (07/23/2018 to 07/30/2018).  ??  Hypothyroidism:   - Continue home levothyroxine  ??  Acne:   - Continue home minocycline  ??  Allergic asthma:    - Continue home montelukast, flonase, zyrtec, budesonide nebs, arformoterol (Brovana) BID  ??  Slow transit constipation:   - Continue linaclotide, bentyl QID prn  ????  Seizure disorder:    - Continue home valproate  ??  Lower extremity muscle spasms:    - Continue cyclobenzaprine prn    Checklist:  Antibiotics: Azithromycin, minocycline  Steroids: None  Prophylaxis: Ambulation  Diet: Regular  Code status: Full  Dispo:??Stepdown status due to??qhs??vent requirement.  Lapse in Medicaid that has led to loss of required home nursing at night due to need for Trilogy vent and trach.  Though he does have a roommate, that roommate is reportedly not there every day, and he has no other family in the area who can stay with him at night. ??SW assisting.  ___________________________________________________________________    Subjective:  [name] had no new complaints this morning.  His dysphagia symptoms are stable.     All 10 systems reviewed and negative except as mentioned above.      Labs/Studies:  Reviewed    Objective:  Temp:  [36.5 ??C (97.7 ??F)-37.1 ??C (98.8 ??F)] 36.5 ??C (97.7 ??F)  Heart Rate:  [80-110] 100  SpO2 Pulse:  [110] 110  Resp:  [10-20] 20  BP: (134-162)/(67-85) 139/84  FiO2 (%):  [28 %] 28 %  SpO2:  [96 %-100 %] 96 %    General: NAD  HEENT: [place]/AT, EOMI  Neck: Supple, trach in place  Cardiovascular: RRR, normal  S1/S2  Pulmonary: CTAB  Abdomen: Soft, non-tender, non-distended, NABS, G-tube site c/d/i with some chronic surrounding erythema  Neuro: Alert and oriented to person / place / time

## 2018-08-03 NOTE — Unmapped (Signed)
GASTROENTEROLOGY INPATIENT CONSULTATION NOTE - [facility]    Requesting Attending Physician :  Caleen Essex*    Reason for Consult:    Cameron Baker is a 27 y.o. male seen in consultation at the request of Dr. Caleen Essex* for dysphagia.    Chief Complaint: trouble swallowing    History of Present Illness:   This is a 27 y.o. male with a PMH significant for VACTERL syndrome, tracheobronchomalacia s/p tracheostomy, chronic tracheobronchitis, recurrent esophageal strictures requiring dilation, and G tube for meds who presented 07/28/2018 with Left hand cellulitis. He has been treated with bactrim and remains inpatient due to lapse in Medicaid coverage.   He has a h/o recurrent esophageal strictures (at 26cm and 35cm from the incisors) requiring dilation, which was last done 05/20/18 to 18mm with TTS balloon. He notes that most recent dilation was effective in relieving his chronic dysphagia until about 7-10 days when dysphagia for solids at the sternal notch returned and has progressively worsened since its onset. He is avoiding bulky solids to compensate.    Review of Systems:  The balance of 12 systems reviewed is negative except as noted in the HPI.     Past Medical History:  Past Medical History:   Diagnosis Date   ??? ADHD    ??? Asthma    ??? Bipolar 1 disorder (CMS-HCC)    ??? Chronic tracheobronchitis (CMS-HCC)    ??? Constipation    ??? De Quervain's tenosynovitis    ??? Dysphagia    ??? Esophageal dysmotility    ??? Esophageal stricture    ??? GERD (gastroesophageal reflux disease)    ??? hypothyroid    ??? Seizures (CMS-HCC)    ??? Tracheobronchomalacia    ??? VATER syndrome    ??? Ventilator dependence (CMS-HCC)        Surgical History:  Past Surgical History:   Procedure Laterality Date   ??? ANAL DILATION  01/21/2006   ??? ESOPHAGOGASTRODUODENOSCOPY      multiple   ??? HIP SURGERY Right 01/19/2017    shaved   ??? IR INSERT PORT AGE GREATER THAN 5 YRS  06/22/2018    IR INSERT PORT AGE GREATER THAN 5 YRS 06/22/2018 Andres Labrum, MD IMG [facility]   ??? LATERAL RECTUS RECESSION  11/15/2003   ??? NISSEN FUNDOPLICATION  2001    x 2 redos   ??? PORTACATH PLACEMENT  01/21/2006    taken out same year   ??? PR ESOPHAGEAL MOTILITY STUDY, MANOMETRY N/A 04/23/2018    Procedure: ESOPHAGEAL MOTILITY STUDY W/INT & REP;  Surgeon: Nurse-Based Giproc;  [facility];  Service: Gastroenterology   ??? PR UP GI ENDOSCOPY,BALL DIL,30MM N/A 02/22/2018    Procedure: UGI ENDO; W/BALLOON DILAT ESOPHAGUS (<30MM DIAM);  Surgeon: Zetta Bills, MD;  Location: [facility];  Service: Gastroenterology   ??? PR UP GI ENDOSCOPY,BALL DIL,30MM N/A 05/20/2018    Procedure: UGI ENDO; W/BALLOON DILAT ESOPHAGUS (<30MM DIAM);  Surgeon: Rona Ravens, MD;  Location: [facility];  Service: Gastroenterology   ??? REMOVAL VENOUS ACCESS PORT [facility] HISTORICAL RESULT)  11/03/2006   ??? STRABISMUS SURGERY Right    ??? TRACHEOSTOMY  1994   ??? WISDOM TOOTH EXTRACTION  04/2012   ??? WRIST SURGERY      3 times.        Family History:  family history is not on file. He was adopted.    Medications:   Current Facility-Administered Medications  Medication Dose Route Frequency Provider Last Rate Last Dose   ??? acetaminophen (TYLENOL) tablet 650 mg  650 mg Oral Q6H PRN Anell Barr, MD       ??? albuterol 2.5 mg /3 mL (0.083 %) nebulizer solution 2.5 mg  2.5 mg Nebulization 4x Daily (RT) Delanna Ahmadi, MD   2.5 mg at 08/03/18 1610   ??? arformoterol (BROVANA) nebulizer solution 15 mcg/2 mL  15 mcg Nebulization BID Delanna Ahmadi, MD   15 mcg at 08/03/18 0835   ??? azithromycin (ZITHROMAX) tablet 500 mg  500 mg G-tube Q MWF Delanna Ahmadi, MD   500 mg at 08/02/18 0840   ??? budesonide (PULMICORT) nebulizer solution 1 mg  1 mg Nebulization BID Delanna Ahmadi, MD   1 mg at 08/03/18 9604   ??? cetirizine (ZyrTEC) tablet 10 mg  10 mg G-tube QPM Delanna Ahmadi, MD   10 mg at 08/02/18 1741   ??? cholecalciferol (vitamin D3) tablet 5,000 Units  5,000 Units Oral Daily Delanna Ahmadi, MD   5,000 Units at 08/03/18 1015   ??? citalopram (CeleXA) tablet 40 mg  40 mg G-tube Nightly Delanna Ahmadi, MD   40 mg at 08/02/18 2231   ??? clindamycin (CLEOCIN T) 1 % external solution   Topical BID Delanna Ahmadi, MD       ??? cyclobenzaprine (FLEXERIL) tablet 5 mg  5 mg Oral TID PRN Delanna Ahmadi, MD   5 mg at 08/03/18 1030   ??? diclofenac sodium (VOLTAREN) 1 % gel 2 g  2 g Topical 4x Daily PRN Delanna Ahmadi, MD       ??? dicyclomine (BENTYL) capsule 10 mg  10 mg G-tube 4x Daily PRN Delanna Ahmadi, MD       ??? esomeprazole (NEXIUM) granules 40 mg  40 mg G-tube Daily Delanna Ahmadi, MD   40 mg at 08/03/18 1023   ??? fluticasone propionate (FLONASE) 50 mcg/actuation nasal spray 2 spray  2 spray Each Nare Daily Delanna Ahmadi, MD   2 spray at 08/03/18 1031   ??? gabapentin (NEURONTIN) capsule 300 mg  300 mg G-tube TID Delanna Ahmadi, MD   300 mg at 08/03/18 1015   ??? heparin, porcine (PF) 100 unit/mL injection 500 Units  500 Units Intravenous Q8H PRN Anell Barr, MD   500 Units at 08/01/18 0902   ??? ibuprofen (ADVIL,MOTRIN) tablet 600 mg  600 mg Oral Q6H PRN Anell Barr, MD       ??? ipratropium (ATROVENT) 0.02 % nebulizer solution 500 mcg  500 mcg Nebulization 4x Daily (RT) Delanna Ahmadi, MD   500 mcg at 08/03/18 0835   ??? levothyroxine (SYNTHROID, LEVOTHROID) tablet 75 mcg  75 mcg G-tube Daily Delanna Ahmadi, MD   75 mcg at 08/03/18 [phone]   ??? LORazepam (ATIVAN) tablet 0.5 mg  0.5 mg Oral BID PRN Clarene Duke, MD       ??? minocycline (MINOCIN,DYNACIN) capsule 100 mg  100 mg G-tube Nightly Delanna Ahmadi, MD   100 mg at 08/02/18 2231   ??? mirtazapine (REMERON) tablet 15 mg  15 mg G-tube Nightly Delanna Ahmadi, MD   15 mg at 08/02/18 2231   ??? montelukast (SINGULAIR) tablet 10 mg  10 mg Oral Nightly Delanna Ahmadi, MD   10 mg at 08/02/18 2231   ??? mupirocin (BACTROBAN) 2 % ointment   Topical TID Delanna Ahmadi, MD       ???  ondansetron (ZOFRAN-ODT) disintegrating tablet 4 mg  4 mg Oral Q8H PRN Anell Barr, MD   Stopped at 08/02/18 1126   ??? polyethylene glycol (MIRALAX) packet 17 g  17 g G-tube Daily PRN Delanna Ahmadi, MD       ??? promethazine (PHENERGAN) oral syrup  12.5 mg G-tube TID PRN Delanna Ahmadi, MD   12.5 mg at 08/02/18 2325   ??? sennosides (SENOKOT) oral syrup  5 mL Oral Nightly PRN Delanna Ahmadi, MD       ??? sodium chloride 3 % nebulizer solution 4 mL  4 mL Nebulization 4x Daily (RT) Delanna Ahmadi, MD   4 mL at 08/03/18 0835   ??? tobramycin (PF) (TOBI) 300 mg/5 mL nebulizer solution 300 mg  300 mg Nebulization BID (RT) Delanna Ahmadi, MD   300 mg at 08/03/18 0835   ??? triamcinolone (KENALOG) 0.1 % ointment   Topical BID Delanna Ahmadi, MD       ??? valproate (DEPAKENE) oral syrup  250 mg G-tube Q AM Delanna Ahmadi, MD   250 mg at 08/03/18 1014   ??? valproate (DEPAKENE) oral syrup  750 mg Oral Nightly Stefani Dama, MD   750 mg at 08/02/18 2232       Allergies:  Xopenex [levalbuterol hcl]; Allopurinol analogues; and Versed [midazolam]    Social History:  Social History     Tobacco Use   ??? Smoking status: Never Smoker   ??? Smokeless tobacco: Never Used   Substance Use Topics   ??? Alcohol use: Yes     Alcohol/week: 2.0 standard drinks     Types: 2 Cans of beer per week     Comment: on weekends sometimes   ??? Drug use: No        Objective:     Vital Signs:  Temp:  [36.5 ??C (97.7 ??F)-37.1 ??C (98.8 ??F)] 36.5 ??C (97.7 ??F)  Heart Rate:  [80-110] 100  SpO2 Pulse:  [110] 110  Resp:  [10-20] 20  BP: (134-162)/(67-85) 139/84  MAP (mmHg):  [94-103] 98  FiO2 (%):  [28 %] 28 %  SpO2:  [96 %-100 %] 96 %    Physical Exam:  Constitutional: Alert, oriented x 3, no acute distress  Mental Status: Thought organized, appropriate affect, pleasantly interactive, not anxious appearing.  HEENT: conjunctiva clear, anicteric, MMM, tracheostomy in place (capped)  Pulmonary: Clear to auscultation, unlabored breathing though mildly tachypneic  Cardiac: regular rate and rhythm, normal S1 and S2, no murmur.  Abdomen: Soft, non-distended, non-tender, no palpable organomegaly or masses, normoactive bowel sounds. G tube noted  Extremities: No edema, well perfused  Musculoskeletal: no joint swelling or deformities noted  Skin: No rashes, jaundice or skin lesions noted  Neuro: Grossly non-focal    Diagnostic Studies:  I reviewed all pertinent diagnostic studies, including:      Labs:    Recent Labs   Lab Units 08/02/18  0856 07/28/18  1857   WBC 10*9/L 8.1 13.0*   RBC 10*12/L 4.93 5.39   HEMOGLOBIN g/dL 64.4* 03.4*   HEMATOCRIT % 37.5* 40.3*   MCV fL 76.2* 74.7*   MCH pg 24.7* 24.1*   MCHC g/dL 74.2 59.5   RDW % 63.8* 16.0*   PLATELET COUNT (1) 10*9/L 367 454*   MPV fL 7.2 6.8*     Recent Labs   Lab Units 08/02/18  0856 07/28/18  1857   SODIUM mmol/L 141 137   POTASSIUM mmol/L 4.6 4.0  CHLORIDE mmol/L 99 102   CO2 mmol/L 31.0* 28.0   BUN mg/dL 8 12   CREATININE mg/dL 9.60 4.54     No results in the last week  No results in the last week  MELD-Na score: 6 at 05/05/2018  6:53 AM  MELD score: 6 at 05/05/2018  6:53 AM  Calculated from:  Serum Creatinine: 0.70 mg/dL (Rounded to 1 mg/dL) at 0/98/1191  4:78 AM  Serum Sodium: 139 mmol/L (Rounded to 137 mmol/L) at 05/05/2018  6:53 AM  Total Bilirubin: 0.7 mg/dL (Rounded to 1 mg/dL) at 2/95/6213 08:65 AM  INR(ratio): 0.97 (Rounded to 1) at 05/03/2018  1:06 AM  Age: 68 years    Imaging:   Radiology studies were personally reviewed     GI Procedures: see HPI    ASSESSMENT: This is a 27 y.o. male with a PMH significant for VACTERL syndrome, tracheobronchomalacia s/p tracheostomy, chronic tracheobronchitis, recurrent esophageal strictures requiring dilation, and G tube for meds who presented 07/28/2018 with Left hand cellulitis. He has been treated with bactrim and remains inpatient due to lapse in Medicaid coverage. He reports response to last EGD with dilation of two strictures (26cm and 35cm from the incisors) until about 1 week prior to admission when he developed recurrent dysphagia. He likely has recurrence of strictures given his history. Underlying dysmotility is on the differential diagnosis as well given prior manometry (05/20/18) consistent with type 2 achalasia vs strictures though I suspect strictures are driving his symptoms given his improvement with dilation initially.     He did have poor CO2 return during dilation of proximal esophageal stricture on EGD in March (likely related to extrinisic compression of his airway 2/2 the balloon dilator) though it responded to deflation of trach cuff. Will need to discuss case with anesthesia at [facility] to determine airway plan prior to the procedure (other option would be to use reinforced ETT through tracheostomy)      RECOMMENDATIONS:   - will discuss potential airway issues with anesthesia prior to scheduling EGD. Anticipate it will be done either 8/29 or 08/06/18.     Discussed with attending, Dr. Luan Pulling who personally evaluated the patient.    The above recommendations were discussed with the primary team.    Thank you for this interesting consult. Please page the [facility] GI pager with any questions or changes in clinical condition.

## 2018-08-04 NOTE — Unmapped (Signed)
VSS, pt independent with care.    Problem: Adult Inpatient Plan of Care  Goal: Plan of Care Review  Outcome: Progressing  Goal: Patient-Specific Goal (Individualization)  Outcome: Progressing  Goal: Absence of Hospital-Acquired Illness or Injury  Outcome: Progressing  Goal: Optimal Comfort and Wellbeing  Outcome: Progressing  Goal: Readiness for Transition of Care  Outcome: Progressing  Goal: Rounds/Family Conference  Outcome: Progressing     Problem: Asthma Comorbidity  Goal: Maintenance of Asthma Control  Outcome: Progressing     Problem: Obstructive Sleep Apnea Risk or Actual (Comorbidity Management)  Goal: Unobstructed Breathing During Sleep  Outcome: Progressing     Problem: Skin and Tissue Injury (Mechanical Ventilation, Invasive)  Goal: Absence of Device-Related Skin and Tissue Injury  Outcome: Progressing     Problem: Ventilator-Induced Lung Injury (Mechanical Ventilation, Invasive)  Goal: Absence of Ventilator-Induced Lung Injury  Outcome: Progressing

## 2018-08-04 NOTE — Unmapped (Signed)
Pt independent with care. VSS. No issues overnight. A/O x4. Trach.  Pt request SMOG enema QHS. NO concerns. Continent of B/B. Continued monitoring of pt during this admx.    Problem: Adult Inpatient Plan of Care  Goal: Plan of Care Review  Outcome: Ongoing - Unchanged  Goal: Patient-Specific Goal (Individualization)  Outcome: Ongoing - Unchanged  Goal: Absence of Hospital-Acquired Illness or Injury  Outcome: Ongoing - Unchanged  Goal: Optimal Comfort and Wellbeing  Outcome: Ongoing - Unchanged  Goal: Readiness for Transition of Care  Outcome: Ongoing - Unchanged  Goal: Rounds/Family Conference  Outcome: Ongoing - Unchanged     Problem: Asthma Comorbidity  Goal: Maintenance of Asthma Control  Outcome: Ongoing - Unchanged     Problem: Obstructive Sleep Apnea Risk or Actual (Comorbidity Management)  Goal: Unobstructed Breathing During Sleep  Outcome: Ongoing - Unchanged     Problem: Skin and Tissue Injury (Mechanical Ventilation, Invasive)  Goal: Absence of Device-Related Skin and Tissue Injury  Outcome: Ongoing - Unchanged     Problem: Ventilator-Induced Lung Injury (Mechanical Ventilation, Invasive)  Goal: Absence of Ventilator-Induced Lung Injury  Outcome: Ongoing - Unchanged

## 2018-08-04 NOTE — Unmapped (Addendum)
Hospital Medicine Daily Progress Note    Assessment/Plan:  [name] is a 27 YO WM with a PMH significant for VATER syndrome associated tracheobronchomalacia s/p trach/PEG and nocturnal mechanical ventilation complicated by frequent admissions for tracheobronchitis, asthma, central sleep apnea, and seizures who presented to the [facility] ED on 07/28/2018 at the behest of his PCP for consideration of admission for IV antibiotics to treat a left hand cellulitis.  On further interview at presentation, however, one of the main concerns was that his Medicaid had lapsed and this resulted in a gap in his at-home nursing care (required for his nightly ventilation).    VATER syndrome-associated tracheobronchomalacia s/p trach/PEG and nocturnal mechanical ventilation: Stable.  His Medicaid apparently lapsed and this has resulted in his inability to have necessary at-home nursing care available at night for his Trilogy ventilation.   - Continue home arfomoterol, budesonide, TOBI nebs, 3% saline nebs, A/A nebs Q4h   - Continue home azithromycin M-W-F   - Continue Trilogy home vent nightly  ??  Type II achalasia and esophageal stenosis secondary to VATER syndrome: His last EGD was in 05/2018 which found to have esophageal stenosis (s/p balloon dilation) and scalloped mucosa, biopsied. Path results showed duodenal mucosa with intact villous architecture??and no increased intraepithelial lymphocytes identified.  He complained of some mild dysphagia on 08/02/2018, concerning for recurrent symptomatic stenosis.     - GI medicine consulted:    - Plan for EGD on 08/05/2018 or 08/06/2018, pending discussion with anesthesia   - Continue outpatient follow-up with Dr. Wilford Corner in GI medicine clinic   - Continue home PPI and gabapentin     Bipolar 1 disorder; PTSD; anxiety:??He has followed with [facility] and undergone psychotherapy previously.??   -??Continue home citalopram, valproate, and mirtazapine??   - Continue lorazepam 0.5 mg PO BID prn Left hand cellulitis: Resolved s/p a course of empiric bactrim (07/23/2018 to 07/30/2018).  ??  Hypothyroidism:   - Continue home levothyroxine  ??  Acne:   - Continue home minocycline  ??  Allergic asthma:    - Continue home montelukast, flonase, zyrtec, budesonide nebs, arformoterol (Brovana) BID  ??  Slow transit constipation:   - Continue linaclotide, bentyl QID prn  ????  Seizure disorder:    - Continue home valproate  ??  Lower extremity muscle spasms:    - Continue cyclobenzaprine prn    Checklist:  Antibiotics: Azithromycin, minocycline  Steroids: None  Prophylaxis: Ambulation  Diet: Regular  Code status: Full  Dispo:??Stepdown status due to??qhs??vent requirement.  Lapse in Medicaid that has led to loss of required home nursing at night due to need for Trilogy vent and trach.  Though he does have a roommate, that roommate is reportedly not there every day, and he has no other family in the area who can stay with him at night. ??SW assisting.  ___________________________________________________________________    Subjective:  Cameron Baker requests SMOG enemas daily prn.      All 10 systems reviewed and negative except as mentioned above.      Labs/Studies:  Reviewed    Objective:  Temp:  [36.3 ??C (97.3 ??F)-36.9 ??C (98.4 ??F)] 36.5 ??C (97.7 ??F)  Heart Rate:  [79-110] 85  Resp:  [18-20] 18  BP: (105-159)/(53-81) 129/75  FiO2 (%):  [28 %] 28 %  SpO2:  [98 %-100 %] 98 %    General: NAD  Neck: trach in place  Cardiovascular: RRR, normal S1/S2  Pulmonary: No increased WOB  Abdomen: Soft, non-tender, non-distended,  NABS, G-tube site c/d/i with some chronic surrounding erythema  Neuro: Alert and oriented to person / place / time

## 2018-08-05 NOTE — Unmapped (Addendum)
Hospital Medicine Daily Progress Note    Assessment/Plan:  Cameron Baker is a 27 YO WM with a PMH significant for VATER syndrome associated tracheobronchomalacia s/p trach/PEG and nocturnal mechanical ventilation complicated by frequent admissions for tracheobronchitis, asthma, central sleep apnea, and seizures who presented to the [facility] ED on 07/28/2018 at the behest of his PCP for consideration of admission for IV antibiotics to treat a left hand cellulitis.  On further interview at presentation, however, one of the main concerns was that his Medicaid had lapsed and this resulted in a gap in his at-home nursing care (required for his nightly ventilation).    VATER syndrome-associated tracheobronchomalacia s/p trach/PEG and nocturnal mechanical ventilation: Stable.  His Medicaid apparently lapsed and this has resulted in his inability to have necessary at-home nursing care available at night for his Trilogy ventilation.   - Continue home arfomoterol, budesonide, TOBI nebs, 3% saline nebs, A/A nebs Q4h   - Continue home azithromycin M-W-F   - Continue Trilogy home vent nightly  ??  Type II achalasia and esophageal stenosis secondary to VATER syndrome: His last EGD was in 05/2018 which found to have esophageal stenosis (s/p balloon dilation) and scalloped mucosa, biopsied. Path results showed duodenal mucosa with intact villous architecture??and no increased intraepithelial lymphocytes identified.  He complained of some mild dysphagia on 08/02/2018, concerning for recurrent symptomatic stenosis.     - GI medicine consulted:    - Plan for EGD on 08/06/2018 at the [facility] at 12:15 PM (nursing is aware and will coordinate transport    - NPO at midnight   - Continue outpatient follow-up with Dr. Wilford Corner in GI medicine clinic   - Continue home PPI and gabapentin     Bipolar 1 disorder; PTSD; anxiety:??He has followed with [facility] and undergone psychotherapy previously.??   -??Continue home citalopram, valproate, and mirtazapine - Continue lorazepam 0.5 mg PO BID prn    Left hand cellulitis: Resolved s/p a course of empiric bactrim (07/23/2018 to 07/30/2018).  ??  Hypothyroidism:   - Continue home levothyroxine  ??  Acne:   - Continue home minocycline  ??  Allergic asthma:    - Continue home montelukast, flonase, zyrtec, budesonide nebs, arformoterol (Brovana) BID  ??  Slow transit constipation:   - Continue linaclotide, bentyl QID prn  ????  Seizure disorder:    - Continue home valproate  ??  Lower extremity muscle spasms:    - Continue cyclobenzaprine prn    Checklist:  Antibiotics: Azithromycin, minocycline  Steroids: None  Prophylaxis: Ambulation  Diet: Regular  Code status: Full  Dispo:??Stepdown status due to??qhs??vent requirement.  Lapse in Medicaid that has led to loss of required home nursing at night due to need for Trilogy vent and trach.  Though he does have a roommate, that roommate is reportedly not there every day, and he has no other family in the area who can stay with him at night. ??SW assisting with disposition plan.  ___________________________________________________________________    Subjective:  Cameron Baker is feeling fine, wants to know when the EGD will be.     All 10 systems reviewed and negative except as mentioned above.      Labs/Studies:  Reviewed    Objective:  Temp:  [36.5 ??C (97.7 ??F)-37.1 ??C (98.8 ??F)] 36.9 ??C (98.5 ??F)  Heart Rate:  [80-108] 80  SpO2 Pulse:  [102] 102  Resp:  [18] 18  BP: (121-133)/(56-77) 121/56  FiO2 (%):  [28 %] 28 %  SpO2:  [98 %-  100 %] 100 %    General: NAD  Neck: trach in place  Cardiovascular: RRR, normal S1/S2  Pulmonary: No increased WOB  Abdomen: Soft, NABS, G-tube site c/d/i with some chronic surrounding erythema  Neuro: Alert and oriented to person / place / time    I attest that I have reviewed the student note and that the components of the history of the present illness, the physical exam, and the assessment and plan documented were performed by me.

## 2018-08-05 NOTE — Unmapped (Signed)
VSS. A/O x4. Pt here for reconsideration of Medicaid. Due for procedure today. No complaints. Managed nausea, pain, and anxiety. Continued monitoring during this admx.    Problem: Adult Inpatient Plan of Care  Goal: Plan of Care Review  Outcome: Ongoing - Unchanged  Goal: Patient-Specific Goal (Individualization)  Outcome: Ongoing - Unchanged  Goal: Absence of Hospital-Acquired Illness or Injury  Outcome: Ongoing - Unchanged  Goal: Optimal Comfort and Wellbeing  Outcome: Ongoing - Unchanged  Goal: Readiness for Transition of Care  Outcome: Ongoing - Unchanged  Goal: Rounds/Family Conference  Outcome: Ongoing - Unchanged     Problem: Asthma Comorbidity  Goal: Maintenance of Asthma Control  Outcome: Ongoing - Unchanged     Problem: Obstructive Sleep Apnea Risk or Actual (Comorbidity Management)  Goal: Unobstructed Breathing During Sleep  Outcome: Ongoing - Unchanged     Problem: Skin and Tissue Injury (Mechanical Ventilation, Invasive)  Goal: Absence of Device-Related Skin and Tissue Injury  Outcome: Ongoing - Unchanged     Problem: Ventilator-Induced Lung Injury (Mechanical Ventilation, Invasive)  Goal: Absence of Ventilator-Induced Lung Injury  Outcome: Ongoing - Unchanged

## 2018-08-05 NOTE — Unmapped (Signed)
Pt stable on room air. Tolerating regular diet,hemodynamically stable. Gtube macerated but intact.voids independently. Denies pain.skin grossly intact.no visitors thus far this shift  Problem: Adult Inpatient Plan of Care  Goal: Plan of Care Review  Outcome: Progressing  Goal: Patient-Specific Goal (Individualization)  Outcome: Progressing  Goal: Absence of Hospital-Acquired Illness or Injury  Outcome: Progressing  Goal: Optimal Comfort and Wellbeing  Outcome: Progressing  Goal: Readiness for Transition of Care  Outcome: Progressing  Goal: Rounds/Family Conference  Outcome: Progressing     Problem: Asthma Comorbidity  Goal: Maintenance of Asthma Control  Outcome: Progressing     Problem: Obstructive Sleep Apnea Risk or Actual (Comorbidity Management)  Goal: Unobstructed Breathing During Sleep  Outcome: Progressing     Problem: Skin and Tissue Injury (Mechanical Ventilation, Invasive)  Goal: Absence of Device-Related Skin and Tissue Injury  Outcome: Progressing     Problem: Ventilator-Induced Lung Injury (Mechanical Ventilation, Invasive)  Goal: Absence of Ventilator-Induced Lung Injury  Outcome: Progressing

## 2018-08-06 DIAGNOSIS — R1310 Dysphagia, unspecified: Principal | ICD-10-CM

## 2018-08-06 NOTE — Unmapped (Signed)
Problem: pt neurologically and hemodynamically stable on trach collar. Tolerating regular diet. Awaiting upper GI scope tomorrow. Pain managed with advil. Skin grossly intact.no family calls or visits thus far this shift  Goal: Plan of Care Review  Outcome: Progressing  Goal: Patient-Specific Goal (Individualization)  Outcome: Progressing  Goal: Absence of Hospital-Acquired Illness or Injury  Outcome: Progressing  Goal: Optimal Comfort and Wellbeing  Outcome: Progressing  Goal: Readiness for Transition of Care  Outcome: Progressing  Goal: Rounds/Family Conference  Outcome: Progressing     Problem: Asthma Comorbidity  Goal: Maintenance of Asthma Control  Outcome: Progressing     Problem: Obstructive Sleep Apnea Risk or Actual (Comorbidity Management)  Goal: Unobstructed Breathing During Sleep  Outcome: Progressing     Problem: Skin and Tissue Injury (Mechanical Ventilation, Invasive)  Goal: Absence of Device-Related Skin and Tissue Injury  Outcome: Progressing     Problem: Ventilator-Induced Lung Injury (Mechanical Ventilation, Invasive)  Goal: Absence of Ventilator-Induced Lung Injury  Outcome: Progressing

## 2018-08-06 NOTE — Unmapped (Signed)
Bedside report given to [facility].  Pt. Transporting to main campus for OR procedure of esophageal dilation.  Pt received breathing treatment prior to departure.

## 2018-08-06 NOTE — Unmapped (Signed)
Inpatient Surgery Update      PRE-OP DOCUMENTATION: PROVIDER CERTIFICATION    I certify that the appropriate documentation of the patient's evaluation, assessment and treatment plan is found within the medical record and that the patientâ€™s condition is unchanged from this earlier assessment.

## 2018-08-06 NOTE — Unmapped (Signed)
[facility] [employer] Daily Progress Note     LOS: 0 days     Assessment/Plan:  Principal Problem:    Social problem  Active Problems:    Asthma    Slow transit constipation    Dysphagia    Esophageal reflux    Hypothyroidism    Class 1 obesity in adult    Seizures (CMS-HCC)    Post traumatic stress disorder (PTSD)    Chronic tracheobronchitis (CMS-HCC)    Tracheostomy dependence (CMS-HCC)    VATER syndrome    Muscle spasms of both lower extremities    Enthesopathy of right hip region    Acne vulgaris    PEG (percutaneous endoscopic gastrostomy) status (CMS-HCC)    History of home ventilator (CMS-HCC)    Bipolar 1 disorder (CMS-HCC)    Esophageal achalasia    Cellulitis of left hand    Central sleep apnea  Resolved Problems:    * No resolved hospital problems. *         VATER syndrome-associated tracheobronchomalacia   s/p trach/PEG and nocturnal mechanical ventilation: His Medicaid apparently lapsed and this has resulted in his inability to have necessary at-home nursing care available at night for his Trilogy ventilation.  - Arfomoterol, budesonide, TOBI nebs, 3% saline nebs, A/A nebs Q4h  - Azithromycin M-W-F  - Trilogy home vent nightly  ??  Type II achalasia and esophageal stenosis   Secondary to VATER syndrome. S/P EGD w esophageal dilation today for recurrent esophageal stenosis.   - Follow-up with Dr. Wilford Corner in GI medicine clinic  - Omeprazole and gabapentin                Bipolar 1 disorder; PTSD; Anxiety??  Follows with [facility]  - Citalopram, valproate, and mirtazapine??  - Lorazepam 0.5 mg PO BID prn  ??  Left hand cellulitis  - Resolved s/p a course of empiric bactrim (07/23/2018 to 07/30/2018  ??  Hypothyroidism  - Levothyroxine  ??  Acne  - Minocycline  ??  Allergic asthma               - Continue home montelukast, flonase, zyrtec, budesonide nebs, arformoterol (Brovana) BID  ??  Slow transit constipation  - Linaclotide, Bentyl QID prn  ????  Seizure disorder   - Valproate  ??  Lower extremity muscle spasms  - Cyclobenzaprine prn     DVT prophylaxis. Ambulatory      Disposition: Possible home with flex funding next week.      Please page the [facility]) pager at [phone] with questions.      Pending labs:       Subjective:   No complaints after EGD today, just hungry.      Objective:   Physical Exam:    GEN: NAD, supine in bed.    EYES: Eyelids, conjunctiva, and sclera were normal. Pupils and eye movements were normal. Cornea, iris, and lens were normal bilaterally.  HEAD, EARS, NOSE, MOUTH, AND THROAT: Head and face were normal. Hearing was normal to voice and the ears were normal to external exam. Nose appearance was normal and there was no discharge. Anterior and posterior oropharynx were normal.  NECK: Bivona trach present and in good position. PMV on.   RESPIRATORY: Breathing pattern was normal and the chest moved symmetrically.  Lung sounds were normal and there were no adventitious sounds.    CARDIOVASCULAR: Rhythm regular, tachycardic rate.  S1 and S2 were normal and there were no extra sounds  or murmurs.   ABDOMEN: The abdomen was obese in contour.  Bowel sounds were present.    Palpation detected no tenderness. Percussion detected no tenderness.   GENITOURINARY - foley absent.    MUSCULOSKELETAL: Skeletal configuration was normal and muscle mass was normal for age. Overall range of motion was normal and joint appearance was overall normal.  SKIN/HAIR/NAILS: Skin color was normal.  There were no skin lesions.  Hair and nails were normal.  NEUROLOGIC: Mental status was normal. otor strength was normal for age in the arms. The patient was normally coordinated  PSYCHIATRIC: The patient was oriented to person, place, time, and circumstance. Speech was normal.    Vital signs in last 24 hours:  Temp:  [36.2 ??C (97.2 ??F)-37.3 ??C (99.1 ??F)] 37.3 ??C (99.1 ??F)  Heart Rate:  [87-110] 103  SpO2 Pulse:  [113] 113  Resp:  [16-20] 19  BP: (115-164)/(59-100) 123/67  MAP (mmHg):  [75-119] 113  FiO2 (%):  [28 %] 28 %  SpO2:  [96 %-99 %] 96 %  BMI (Calculated):  [35.11] 35.11    Intake/Output last 24 hours:    Intake/Output Summary (Last 24 hours) at 08/06/2018 1341  Last data filed at 08/06/2018 1000  Gross per 24 hour   Intake 1035 ml   Output 1525 ml   Net -490 ml       Medications:   Scheduled Meds:  ??? albuterol  2.5 mg Nebulization 4x Daily (RT)   ??? albuterol       ??? arformoterol  15 mcg Nebulization BID   ??? azithromycin  500 mg G-tube Q MWF   ??? budesonide  1 mg Nebulization BID   ??? cetirizine  10 mg G-tube QPM   ??? cholecalciferol (vitamin D3)  5,000 Units Oral Daily   ??? citalopram  40 mg G-tube Nightly   ??? clindamycin   Topical BID   ??? esomeprazole  40 mg G-tube Daily   ??? fluticasone propionate  2 spray Each Nare Daily   ??? gabapentin  300 mg G-tube TID   ??? ipratropium  500 mcg Nebulization 4x Daily (RT)   ??? ipratropium       ??? levothyroxine  75 mcg G-tube Daily   ??? minocycline  100 mg G-tube Nightly   ??? mirtazapine  15 mg G-tube Nightly   ??? montelukast  10 mg Oral Nightly   ??? mupirocin   Topical TID   ??? sodium chloride  4 mL Nebulization 4x Daily (RT)   ??? tobramycin (PF)  300 mg Nebulization BID (RT)   ??? triamcinolone   Topical BID   ??? valproate  250 mg G-tube Q AM   ??? valproate  750 mg Oral Nightly     Continuous Infusions:    D Linford Arnold MD  [facility].

## 2018-08-06 NOTE — Unmapped (Signed)
11am Pt taken to [facility] at this time for procedure, pending pt's return this evening

## 2018-08-06 NOTE — Unmapped (Signed)
Patient received on room air then placed on trilogy as home settings.Patient trach is midline and secure with minimal secretions.Patient received all scheduled meds as ordered.No distress or complications noted this shift.

## 2018-08-07 NOTE — Unmapped (Signed)
Pt trasported to main campus this am for procedure, pt received scheduled am and after tx's before transport at scheduled times. No c/o discomfort after.

## 2018-08-07 NOTE — Unmapped (Signed)
Pt. Transported to main campus at 11am this morning for esophageal dilation.  Waiting for transport to return to [facility]. Report received from [place], GI procedure RN at approximately 15:00.  Pt. Reported to be stable following his procedure.    Prior to transport, pt was neurologically intact; ambulating and independent ADLs.  Minor pain treated with prn flexeril with good effect.  Hemodynamically, his vital signs were stable.  Lung sounds were clear bilaterally, on room air.  Using speaking valve.  Pt was NPO for procedure.  Only meds given through LUQ peg.  No BM.  Voiding without difficulty and UOP was adequate.  No new s/s of skin breakdown.      Will continue to monitor when he returns from main campus.

## 2018-08-07 NOTE — Unmapped (Signed)
Problem: Adult Inpatient Plan of Care  Goal: Plan of Care Review  Outcome: Progressing  Flowsheets (Taken 08/07/2018 0522)  Progress: improving  Plan of Care Reviewed With: patient  Note:   Patient returned from [facility] prior to start of shift s/p upper endo with dilation. Administered prn Tylenol, Motrin, and Phenergan for pain and nausea with adequate effect. Ate 100% dinner tonight. Independent in room, PMV/RA w/awake & home Vent w/asleep without complications. VSS.  Goal: Patient-Specific Goal (Individualization)  Outcome: Progressing  Goal: Absence of Hospital-Acquired Illness or Injury  Outcome: Progressing  Goal: Optimal Comfort and Wellbeing  Outcome: Progressing  Goal: Readiness for Transition of Care  Outcome: Progressing  Goal: Rounds/Family Conference  Outcome: Progressing     Problem: Asthma Comorbidity  Goal: Maintenance of Asthma Control  Outcome: Progressing     Problem: Obstructive Sleep Apnea Risk or Actual (Comorbidity Management)  Goal: Unobstructed Breathing During Sleep  Outcome: Progressing     Problem: Skin and Tissue Injury (Mechanical Ventilation, Invasive)  Goal: Absence of Device-Related Skin and Tissue Injury  Outcome: Progressing     Problem: Ventilator-Induced Lung Injury (Mechanical Ventilation, Invasive)  Goal: Absence of Ventilator-Induced Lung Injury  Outcome: Progressing

## 2018-08-07 NOTE — Unmapped (Signed)
Pt. Remains neurologically intact; independent with ADLs and self care.  C/O of throat pain have been managed with prn chloraseptic spray and motrin.  Lung sounds rhonci at start of shift, clearing as the day progresses.  On room air with speaking valve for this shift.  100% o2 sats.  Hemodynamically, VSS.  NSR to low ST with systolic b/p's ranging between 140s-160s.  Voiding without difficulty; UOP adequate.  Appetite sufficient; no BM this shift.  Pt. Has requested a prn smog enema.  Skin remains intact; no new s/s of skin breakdown.  Will continue to monitor.    Problem: Adult Inpatient Plan of Care  Goal: Plan of Care Review  Outcome: Progressing  Goal: Patient-Specific Goal (Individualization)  Outcome: Progressing  Flowsheets  Taken 08/07/2018 1555 by Orlinda Blalock, RN  Patient-Specific Goals (Include Timeframe): Return to baseline sablization prior to admission.  Individualized Care Needs: Care clustered.  Encourage activity, frequent ambulation and independent ADLs  Taken 07/31/2018 0139 by Althea Grimmer, RN  Anxieties, Fears or Concerns: Pt denies anxiety     Problem: Asthma Comorbidity  Goal: Maintenance of Asthma Control  Outcome: Progressing     Problem: Skin and Tissue Injury (Mechanical Ventilation, Invasive)  Goal: Absence of Device-Related Skin and Tissue Injury  Outcome: Progressing     Problem: Ventilator-Induced Lung Injury (Mechanical Ventilation, Invasive)  Goal: Absence of Ventilator-Induced Lung Injury  Outcome: Progressing     Problem: Obstructive Sleep Apnea Risk or Actual (Comorbidity Management)  Goal: Unobstructed Breathing During Sleep  Outcome: Ongoing - Unchanged

## 2018-08-07 NOTE — Unmapped (Signed)
[facility] [employer] Daily Progress Note     LOS: 0 days     Assessment/Plan:  Principal Problem:    Social problem  Active Problems:    Asthma    Slow transit constipation    Dysphagia    Esophageal reflux    Hypothyroidism    Class 1 obesity in adult    Seizures (CMS-HCC)    Post traumatic stress disorder (PTSD)    Chronic tracheobronchitis (CMS-HCC)    Tracheostomy dependence (CMS-HCC)    VATER syndrome    Muscle spasms of both lower extremities    Enthesopathy of right hip region    Acne vulgaris    PEG (percutaneous endoscopic gastrostomy) status (CMS-HCC)    History of home ventilator (CMS-HCC)    Bipolar 1 disorder (CMS-HCC)    Esophageal achalasia    Cellulitis of left hand    Central sleep apnea  Resolved Problems:    * No resolved hospital problems. *         VATER syndrome-associated tracheobronchomalacia   Medicaid laps resulted in inability to have necessary in home nursing care available at night  - Arfomoterol, budesonide, TOBI nebs, 3% saline nebs, A/A nebs Q4h  - Azithromycin M-W-F  - Trilogy home vent nightly  ??  Type II achalasia and esophageal stenosis   Secondary to VATER syndrome. S/P EGD w esophageal dilation yesterday for recurrent esophageal stenosis.   - Follow-up with Dr. Wilford Corner in GI medicine clinic  - Omeprazole and gabapentin                Bipolar 1 disorder; PTSD; Anxiety??  Follows with [facility]. Speech a bit pressured this afternoon.    - Citalopram, valproate, and mirtazapine??  - Lorazepam 0.5 mg PO BID prn  ??  Left hand cellulitis  - Resolved s/p a course of empiric bactrim (07/23/2018 to 07/30/2018  ??  Hypothyroidism  - Levothyroxine  ??  Acne  - Minocycline  ??  Allergic asthma               - Continue home montelukast, flonase, zyrtec, budesonide nebs, arformoterol (Brovana) BID  ??  Slow transit constipation  - Linaclotide, Bentyl QID prn  ????  Seizure disorder   - Valproate  ??  Lower extremity muscle spasms  - Cyclobenzaprine prn     DVT prophylaxis. Ambulatory Disposition: Possible home with flex funding next week.      Please page the [facility]) pager at [phone] with questions.      Pending labs:       Subjective:   Tolerating meals well after esophageal dilation yesterday.      Objective:   Physical Exam:    GEN: NAD, walking around unit.     NECK: Bivona trach present and in good position. PMV on.   RESPIRATORY: Breathing pattern was normal and the chest moved symmetrically.  Lung sounds were normal and there were no adventitious sounds.    CARDIOVASCULAR: Rhythm regular, tachycardic rate.  S1 and S2 were normal and there were no extra sounds or murmurs.   ABDOMEN: The abdomen was obese in contour.  Bowel sounds were present.    Palpation detected no tenderness. Percussion detected no tenderness. Peg button present and in good position.   GENITOURINARY - foley absent.    MUSCULOSKELETAL: Skeletal configuration was normal and muscle mass was normal for age. Overall range of motion was normal and joint appearance was overall normal.  SKIN/HAIR/NAILS: Skin color was  normal.  There were no skin lesions.  Hair and nails were normal.  NEUROLOGIC: Mental status was normal. otor strength was normal for age in the arms. The patient was normally coordinated, normal gait/ at his baseline.   PSYCHIATRIC: The patient was oriented to person, place, time, and circumstance. Speech was normal.    Vital signs in last 24 hours:  Temp:  [36 ??C (96.8 ??F)-37.3 ??C (99.1 ??F)] 36.6 ??C (97.8 ??F)  Heart Rate:  [75-108] 95  SpO2 Pulse:  [110] 110  Resp:  [12-24] 14  BP: (113-154)/(64-102) 122/78  MAP (mmHg):  [83-116] 91  FiO2 (%):  [28 %] 28 %  SpO2:  [94 %-98 %] 96 %  BMI (Calculated):  [35.11] 35.11    Intake/Output last 24 hours:    Intake/Output Summary (Last 24 hours) at 08/07/2018 0826  Last data filed at 08/06/2018 2300  Gross per 24 hour   Intake 700 ml   Output 875 ml   Net -175 ml       Medications:   Scheduled Meds:  ??? albuterol  2.5 mg Nebulization 4x Daily (RT) ??? arformoterol  15 mcg Nebulization BID   ??? azithromycin  500 mg G-tube Q MWF   ??? budesonide  1 mg Nebulization BID   ??? cetirizine  10 mg G-tube QPM   ??? cholecalciferol (vitamin D3)  5,000 Units Oral Daily   ??? citalopram  40 mg G-tube Nightly   ??? clindamycin   Topical BID   ??? esomeprazole  40 mg G-tube Daily   ??? fluticasone propionate  2 spray Each Nare Daily   ??? gabapentin  300 mg G-tube TID   ??? ipratropium  500 mcg Nebulization 4x Daily (RT)   ??? levothyroxine  75 mcg G-tube Daily   ??? minocycline  100 mg G-tube Nightly   ??? mirtazapine  15 mg G-tube Nightly   ??? montelukast  10 mg Oral Nightly   ??? mupirocin   Topical TID   ??? sodium chloride  4 mL Nebulization 4x Daily (RT)   ??? tobramycin (PF)  300 mg Nebulization BID (RT)   ??? triamcinolone   Topical BID   ??? valproate  250 mg G-tube Q AM   ??? valproate  750 mg Oral Nightly     Continuous Infusions:    D Linford Arnold MD  [facility].

## 2018-08-08 NOTE — Unmapped (Signed)
Problem: Adult Inpatient Plan of Care  Goal: Plan of Care Review  Outcome: Progressing  Flowsheets (Taken 08/08/2018 1610)  Progress: no change  Plan of Care Reviewed With: patient  Note:   No acute events this shift. 100% dinner tonight. Independent in room, PMV/RA w/awake & Trilogy vent w/asleep without complications. VSS. Denies pain.  Goal: Patient-Specific Goal (Individualization)  Outcome: Progressing  Goal: Absence of Hospital-Acquired Illness or Injury  Outcome: Progressing  Goal: Optimal Comfort and Wellbeing  Outcome: Progressing  Goal: Readiness for Transition of Care  Outcome: Progressing  Goal: Rounds/Family Conference  Outcome: Progressing     Problem: Asthma Comorbidity  Goal: Maintenance of Asthma Control  Outcome: Progressing     Problem: Obstructive Sleep Apnea Risk or Actual (Comorbidity Management)  Goal: Unobstructed Breathing During Sleep  Outcome: Progressing     Problem: Skin and Tissue Injury (Mechanical Ventilation, Invasive)  Goal: Absence of Device-Related Skin and Tissue Injury  Outcome: Progressing     Problem: Ventilator-Induced Lung Injury (Mechanical Ventilation, Invasive)  Goal: Absence of Ventilator-Induced Lung Injury  Outcome: Progressing

## 2018-08-08 NOTE — Unmapped (Signed)
Pt received scheduled inhalation medication as ordered tolerated well, cough assist preformed with productive cough. Pt also on RA throughout this shift with PMV in place. RT will continue to monitor and follow. Pt also re-educated on infection control with self trach care/suctioning.

## 2018-08-08 NOTE — Unmapped (Signed)
[facility] [employer] Daily Progress Note     LOS: 0 days     Assessment/Plan:  Principal Problem:    Social problem  Active Problems:    Asthma    Slow transit constipation    Dysphagia    Esophageal reflux    Hypothyroidism    Class 1 obesity in adult    Seizures (CMS-HCC)    Post traumatic stress disorder (PTSD)    Chronic tracheobronchitis (CMS-HCC)    Tracheostomy dependence (CMS-HCC)    VATER syndrome    Muscle spasms of both lower extremities    Enthesopathy of right hip region    Acne vulgaris    PEG (percutaneous endoscopic gastrostomy) status (CMS-HCC)    History of home ventilator (CMS-HCC)    Bipolar 1 disorder (CMS-HCC)    Esophageal achalasia    Cellulitis of left hand    Central sleep apnea  Resolved Problems:    * No resolved hospital problems. *         VATER syndrome-associated tracheobronchomalacia   Medicaid laps resulted in inability to have necessary in home nursing care available at night.   - Arfomoterol, budesonide, TOBI nebs, 3% saline nebs, A/A nebs Q4h  - Azithromycin M-W-F  - Trilogy home vent nightly  ??  Type II achalasia and esophageal stenosis   Secondary to VATER syndrome. S/P EGD w esophageal dilation last week, for recurrent esophageal stenosis. Tolerating meals without incident.   - Follow-up with Dr. Wilford Corner in GI medicine clinic  - Omeprazole and gabapentin                Bipolar 1 disorder; PTSD; Anxiety??  Follows with [facility]. Speech less pressured today.    - Citalopram, valproate, and mirtazapine??  - Lorazepam 0.5 mg PO BID prn  ??  Left hand cellulitis  - Resolved s/p a course of empiric bactrim (07/23/2018 to 07/30/2018  ??  Hypothyroidism  - Levothyroxine  ??  Acne  - Minocycline  ??  Allergic asthma    - Montelukast, flonase, zyrtec, budesonide nebs, arformoterol (Brovana) BID  ??  Slow transit constipation  - Linaclotide, Bentyl QID prn  ????  Seizure disorder   - Valproate  ??  Lower extremity muscle spasms  - Cyclobenzaprine prn     DVT prophylaxis. Ambulatory Disposition: Possible home with flex funding next week.      Please page the [facility]) pager at [phone] with questions.      Pending labs:       Subjective:   No issues. Bored.     Objective:   Physical Exam:    GEN: NAD, visiting with friend.   NECK: Bivona trach present and in good position. PMV on.   RESPIRATORY: Breathing pattern was normal and the chest moved symmetrically.  Lung sounds were normal and there were no adventitious sounds.    CARDIOVASCULAR: Rhythm regular, tachycardic rate.  S1 and S2 were normal and there were no extra sounds or murmurs.   ABDOMEN: The abdomen was obese in contour.  Bowel sounds were present.    Palpation detected no tenderness. Peg button present and in good position.   MUSCULOSKELETAL: Skeletal configuration was normal and muscle mass was normal for age.  SKIN/HAIR/NAILS: Skin color was normal.  There were no skin lesions.  Hair and nails were normal.  NEUROLOGIC: Mental status was normal. otor strength was normal for age in the arms. The patient was normally coordinated, normal gait/ at his baseline.  PSYCHIATRIC: The patient was oriented to person, place, time, and circumstance. Speech was normal.    Vital signs in last 24 hours:  Temp:  [36.7 ??C (98.1 ??F)-37 ??C (98.6 ??F)] 36.8 ??C (98.2 ??F)  Heart Rate:  [96-117] 110  Resp:  [14-22] 18  BP: (123-155)/(80-116) 155/98  MAP (mmHg):  [96-123] 114  FiO2 (%):  [28 %] 28 %  SpO2:  [96 %-100 %] 100 %    Intake/Output last 24 hours:    Intake/Output Summary (Last 24 hours) at 08/08/2018 1418  Last data filed at 08/08/2018 0600  Gross per 24 hour   Intake 480 ml   Output 425 ml   Net 55 ml       Medications:   Scheduled Meds:  ??? albuterol  2.5 mg Nebulization 4x Daily (RT)   ??? arformoterol  15 mcg Nebulization BID   ??? azithromycin  500 mg G-tube Q MWF   ??? budesonide  1 mg Nebulization BID   ??? cetirizine  10 mg G-tube QPM   ??? cholecalciferol (vitamin D3)  5,000 Units Oral Daily   ??? citalopram  40 mg G-tube Nightly ??? clindamycin   Topical BID   ??? esomeprazole  40 mg G-tube Daily   ??? fluticasone propionate  2 spray Each Nare Daily   ??? gabapentin  300 mg G-tube TID   ??? ipratropium  500 mcg Nebulization 4x Daily (RT)   ??? levothyroxine  75 mcg G-tube Daily   ??? minocycline  100 mg G-tube Nightly   ??? mirtazapine  15 mg G-tube Nightly   ??? montelukast  10 mg Oral Nightly   ??? mupirocin   Topical TID   ??? sodium chloride  4 mL Nebulization 4x Daily (RT)   ??? tobramycin (PF)  300 mg Nebulization BID (RT)   ??? triamcinolone   Topical BID   ??? valproate  250 mg G-tube Q AM   ??? valproate  750 mg Oral Nightly     Continuous Infusions:    D Linford Arnold MD  [facility].

## 2018-08-08 NOTE — Unmapped (Signed)
Pt is on PMV during the HS and trilogy at night. All inhalation medications given and tolerated well with no distress noted. Friend at bedside.

## 2018-08-09 NOTE — Unmapped (Signed)
Patient tolerated room air with passy muir valve well throughout shift, no distress noticed.  Tolerated treatments and cough assist well, trach care done.  Will continue to monitor and support.

## 2018-08-09 NOTE — Unmapped (Signed)
Pt is on PMV with RA during the day and trilogy vent at HS. All inhalation medication given with no adverse reactions noted.Friend is at bedside.

## 2018-08-09 NOTE — Unmapped (Signed)
[facility] [employer] Daily Progress Note     LOS: 0 days     Assessment/Plan:  Principal Problem:    Social problem  Active Problems:    Asthma    Slow transit constipation    Dysphagia    Esophageal reflux    Hypothyroidism    Class 1 obesity in adult    Seizures (CMS-HCC)    Post traumatic stress disorder (PTSD)    Chronic tracheobronchitis (CMS-HCC)    Tracheostomy dependence (CMS-HCC)    VATER syndrome    Muscle spasms of both lower extremities    Enthesopathy of right hip region    Acne vulgaris    PEG (percutaneous endoscopic gastrostomy) status (CMS-HCC)    History of home ventilator (CMS-HCC)    Bipolar 1 disorder (CMS-HCC)    Esophageal achalasia    Cellulitis of left hand    Central sleep apnea  Resolved Problems:    * No resolved hospital problems. *         VATER syndrome-associated tracheobronchomalacia   Medicaid laps resulted in inability to have necessary in home nursing care available qhs   - Arfomoterol, budesonide, TOBI nebs, 3% saline nebs, A/A nebs Q4h  - Azithromycin M-W-F  - Trilogy home vent nightly  ??  Type II achalasia and esophageal stenosis   Secondary to VATER syndrome. S/P EGD w esophageal dilation last week, for recurrent esophageal stenosis. Tolerating meals no issues  - Follow-up with Dr. Wilford Corner in GI medicine clinic  - Omeprazole and gabapentin                Bipolar 1 disorder; PTSD; Anxiety??  Follows with [facility].    - Citalopram, valproate, and mirtazapine??  - Lorazepam 0.5 mg PO BID prn  ??  Left hand cellulitis  - Resolved s/p a course of empiric bactrim (07/23/2018 to 07/30/2018  ??  Hypothyroidism  - Levothyroxine  ??  Acne  - Minocycline  ??  Allergic asthma    - Montelukast, flonase, zyrtec, budesonide nebs, arformoterol (Brovana) BID  ??  Slow transit constipation  - Linaclotide, Bentyl QID prn  ????  Seizure disorder   - Valproate  ??  Lower extremity muscle spasms  - Cyclobenzaprine prn     DVT prophylaxis. Ambulatory      Disposition: Possible home with flex funding next week.      Please page the [facility]) pager at [phone] with questions.      Pending labs:       Subjective:   No issues. Bored. Describes secretions as yellowish, perhaps a bit more copious and frequent.     Objective:   Physical Exam:    GEN: NAD, lying in bed.   NECK: Bivona trach present and in good position. PMV off. .   RESPIRATORY: Breathing pattern was normal and the chest moved symmetrically.  Lung sounds were normal and there were no adventitious sounds.    CARDIOVASCULAR: Rhythm regular, tachycardic rate.  S1 and S2 were normal and there were no extra sounds or murmurs.   ABDOMEN: The abdomen was obese in contour.  Bowel sounds were present.    Palpation detected no tenderness. Peg button present and in good position.   MUSCULOSKELETAL: Skeletal configuration was normal and muscle mass was normal for age.  SKIN/HAIR/NAILS: Skin color was normal.  There were no skin lesions.  Hair and nails were normal.  NEUROLOGIC: Mental status was normal. otor strength was normal for age in the arms.  The patient was normally coordinated, normal gait/ at his baseline.   PSYCHIATRIC: The patient was oriented to person, place, time, and circumstance. Speech was normal.    Vital signs in last 24 hours:  Temp:  [36.1 ??C (97 ??F)-36.8 ??C (98.3 ??F)] 36.1 ??C (97 ??F)  Heart Rate:  [93-110] 93  SpO2 Pulse:  [108] 108  Resp:  [16-18] 18  BP: (130-155)/(77-98) 130/86  MAP (mmHg):  [99-114] 101  FiO2 (%):  [28 %] 28 %  SpO2:  [96 %-100 %] 100 %    Intake/Output last 24 hours:    Intake/Output Summary (Last 24 hours) at 08/09/2018 0734  Last data filed at 08/09/2018 0000  Gross per 24 hour   Intake 1195 ml   Output 600 ml   Net 595 ml       Medications:   Scheduled Meds:  ??? albuterol  2.5 mg Nebulization 4x Daily (RT)   ??? arformoterol  15 mcg Nebulization BID   ??? azithromycin  500 mg G-tube Q MWF   ??? budesonide  1 mg Nebulization BID   ??? cetirizine  10 mg G-tube QPM   ??? cholecalciferol (vitamin D3)  5,000 Units Oral Daily   ??? citalopram  40 mg G-tube Nightly   ??? clindamycin   Topical BID   ??? esomeprazole  40 mg G-tube Daily   ??? fluticasone propionate  2 spray Each Nare Daily   ??? gabapentin  300 mg G-tube TID   ??? ipratropium  500 mcg Nebulization 4x Daily (RT)   ??? levothyroxine  75 mcg G-tube Daily   ??? minocycline  100 mg G-tube Nightly   ??? mirtazapine  15 mg G-tube Nightly   ??? montelukast  10 mg Oral Nightly   ??? mupirocin   Topical TID   ??? sodium chloride  4 mL Nebulization 4x Daily (RT)   ??? tobramycin (PF)  300 mg Nebulization BID (RT)   ??? triamcinolone   Topical BID   ??? valproate  250 mg G-tube Q AM   ??? valproate  750 mg Oral Nightly     Continuous Infusions:    ___________________________________________    Scribe's Attestation: Linford Arnold, MD obtained and performed the history, physical exam and medical decision making elements that were  entered into the chart. Documentation assistance was provided by me personally, a scribe. Signed by Vidal Schwalbe, scribe, on August 09, 2018 at 11:18 AM.         I was present, saw and evaluated Cameron Baker. I agree with the documented findings as outlined by Dolores Hoose MD  [facility].

## 2018-08-09 NOTE — Unmapped (Signed)
Pt A+O x4.  VSSA.  No complaints of pain.  Patient off unit and ambulating with friend several times through shift.  PO intake adequate.  Phenergan x1 for nausea.  Will continue to monitor.    Problem: Adult Inpatient Plan of Care  Goal: Plan of Care Review  Outcome: Progressing  Goal: Patient-Specific Goal (Individualization)  Outcome: Progressing  Goal: Absence of Hospital-Acquired Illness or Injury  Outcome: Progressing  Goal: Optimal Comfort and Wellbeing  Outcome: Progressing  Goal: Readiness for Transition of Care  Outcome: Progressing  Goal: Rounds/Family Conference  Outcome: Progressing     Problem: Asthma Comorbidity  Goal: Maintenance of Asthma Control  Outcome: Progressing     Problem: Obstructive Sleep Apnea Risk or Actual (Comorbidity Management)  Goal: Unobstructed Breathing During Sleep  Outcome: Progressing     Problem: Skin and Tissue Injury (Mechanical Ventilation, Invasive)  Goal: Absence of Device-Related Skin and Tissue Injury  Outcome: Progressing     Problem: Ventilator-Induced Lung Injury (Mechanical Ventilation, Invasive)  Goal: Absence of Ventilator-Induced Lung Injury  Outcome: Progressing

## 2018-08-10 NOTE — Unmapped (Signed)
No changes made, pt received inhaled meds

## 2018-08-10 NOTE — Unmapped (Signed)
Problem: Adult Inpatient Plan of Care  Goal: Plan of Care Review  Outcome: Progressing  Flowsheets (Taken 08/09/2018 1832)  Progress: no change  Plan of Care Reviewed With: patient  Note:   No acute events this shift. 100% of meals eaten today. Independent in room and off unit pass x 1.5hrs, PMV/RA & no issues reported w/ Trilogy vent over night. VSS. PRN Motrin administered for chronic throat discomfort with adequate effect. Pt also reporting chest Port site is sore, needle access d/cd per MD order, pt reported relief in soreness.  Goal: Patient-Specific Goal (Individualization)  Outcome: Progressing  Goal: Absence of Hospital-Acquired Illness or Injury  Outcome: Progressing  Goal: Optimal Comfort and Wellbeing  Outcome: Progressing  Goal: Readiness for Transition of Care  Outcome: Progressing  Goal: Rounds/Family Conference  Outcome: Progressing     Problem: Asthma Comorbidity  Goal: Maintenance of Asthma Control  Outcome: Progressing     Problem: Obstructive Sleep Apnea Risk or Actual (Comorbidity Management)  Goal: Unobstructed Breathing During Sleep  Outcome: Progressing     Problem: Skin and Tissue Injury (Mechanical Ventilation, Invasive)  Goal: Absence of Device-Related Skin and Tissue Injury  Outcome: Progressing     Problem: Ventilator-Induced Lung Injury (Mechanical Ventilation, Invasive)  Goal: Absence of Ventilator-Induced Lung Injury  Outcome: Progressing

## 2018-08-10 NOTE — Unmapped (Signed)
Pt did state he was feeling like I'm getting that respiratory bug again. MD notified, orders for lower respiratory and NP cx in and samples sent to lab.  Awaiting results.  Pt off unit x 1 this AM.  See flowsheet for details.  Problem: Adult Inpatient Plan of Care  Goal: Plan of Care Review  Outcome: Progressing  Goal: Patient-Specific Goal (Individualization)  Outcome: Progressing  Goal: Absence of Hospital-Acquired Illness or Injury  Outcome: Progressing  Goal: Optimal Comfort and Wellbeing  Outcome: Progressing  Goal: Readiness for Transition of Care  Outcome: Progressing  Goal: Rounds/Family Conference  Outcome: Progressing     Problem: Asthma Comorbidity  Goal: Maintenance of Asthma Control  Outcome: Progressing     Problem: Obstructive Sleep Apnea Risk or Actual (Comorbidity Management)  Goal: Unobstructed Breathing During Sleep  Outcome: Progressing     Problem: Skin and Tissue Injury (Mechanical Ventilation, Invasive)  Goal: Absence of Device-Related Skin and Tissue Injury  Outcome: Progressing     Problem: Ventilator-Induced Lung Injury (Mechanical Ventilation, Invasive)  Goal: Absence of Ventilator-Induced Lung Injury  Outcome: Progressing

## 2018-08-10 NOTE — Unmapped (Signed)
I was unable to converse with Cameron Baker during his visit.  He was fast asleep.  I conferenced with the in-patient RT staff and they state that he is doing RT treatments per home regimen w/o complication.  No noted needs that I can meet at this time.

## 2018-08-10 NOTE — Unmapped (Signed)
[facility] [employer] Daily Progress Note     LOS: 0 days     Assessment/Plan:  Principal Problem:    Social problem  Active Problems:    Asthma    Slow transit constipation    Dysphagia    Esophageal reflux    Hypothyroidism    Class 1 obesity in adult    Seizures (CMS-HCC)    Post traumatic stress disorder (PTSD)    Chronic tracheobronchitis (CMS-HCC)    Tracheostomy dependence (CMS-HCC)    VATER syndrome    Muscle spasms of both lower extremities    Enthesopathy of right hip region    Acne vulgaris    PEG (percutaneous endoscopic gastrostomy) status (CMS-HCC)    History of home ventilator (CMS-HCC)    Bipolar 1 disorder (CMS-HCC)    Esophageal achalasia    Cellulitis of left hand    Central sleep apnea  Resolved Problems:    * No resolved hospital problems. *         VATER syndrome-associated tracheobronchomalacia   Will send sputum culture and respiratory viral panel. Change in secretions could be due to his lax in trach care. There are unverified reports of him outside vaping as well.   - Arfomoterol, budesonide, TOBI nebs, 3% saline nebs, A/A nebs Q4h  - Azithromycin M-W-F  - Trilogy home vent nightly  ??  Type II achalasia and esophageal stenosis   Secondary to VATER syndrome. S/P EGD w esophageal dilation last week, for recurrent esophageal stenosis. Tolerating meals    - Follow-up with Dr. Wilford Corner in GI medicine clinic  - Omeprazole and gabapentin                Bipolar 1 disorder; PTSD; Anxiety??  Follows with [facility].    - Citalopram, valproate, and mirtazapine??  - Lorazepam 0.5 mg PO BID prn  ??  Left hand cellulitis  - Resolved s/p a course of empiric bactrim (07/23/2018 to 07/30/2018).     ??  Hypothyroidism  - Levothyroxine  ??  Acne  - Minocycline  ??  Allergic asthma    - Montelukast, flonase, zyrtec, budesonide nebs, arformoterol (Brovana) BID  ??  Slow transit constipation  - Linaclotide, Bentyl QID prn  ????  Seizure disorder   - Valproate  ??  Lower extremity muscle spasms  - Cyclobenzaprine prn DVT prophylaxis. Ambulatory      Disposition: Possible home with flex funding next week.      Please page the [facility]) pager at [phone] with questions.      Pending labs:       Subjective:   Notes secretions are still thick and discolored. Feels a bit fatigued. No new symptoms otherwise.       Objective:   Physical Exam:    GEN: NAD, lying in bed, watching TV.   NECK: Bivona trach present and in good position. PMV on w ATC.      RESPIRATORY: Breathing pattern was normal and the chest moved symmetrically.  Lung sounds were normal and there were no adventitious sounds.    CARDIOVASCULAR: Rhythm regular, tachycardic rate.  S1 and S2 were normal and there were no extra sounds or murmurs.   ABDOMEN: The abdomen was obese in contour.  Bowel sounds were present.    Palpation detected no tenderness. Peg button present and in good position.   SKIN/HAIR/NAILS: Skin color was normal.  There were no skin lesions.  Hair and nails were normal.  NEUROLOGIC: Mental status  was normal. . The patient was normally coordinated, normal gait/ at his baseline.   PSYCHIATRIC: The patient was oriented to person, place, time, and circumstance. Speech was normal.    Vital signs in last 24 hours:  Temp:  [36.4 ??C (97.6 ??F)-37 ??C (98.6 ??F)] 37 ??C (98.6 ??F)  Heart Rate:  [92-114] 92  SpO2 Pulse:  [112] 112  Resp:  [18-20] 18  BP: (127-145)/(67-91) 130/72  MAP (mmHg):  [84-107] 97  FiO2 (%):  [28 %] 28 %  SpO2:  [96 %-99 %] 97 %    Intake/Output last 24 hours:    Intake/Output Summary (Last 24 hours) at 08/10/2018 0757  Last data filed at 08/10/2018 0000  Gross per 24 hour   Intake 1150 ml   Output 800 ml   Net 350 ml       Medications:   Scheduled Meds:  ??? albuterol  2.5 mg Nebulization 4x Daily (RT)   ??? arformoterol  15 mcg Nebulization BID   ??? azithromycin  500 mg G-tube Q MWF   ??? budesonide  1 mg Nebulization BID   ??? cetirizine  10 mg G-tube QPM   ??? cholecalciferol (vitamin D3)  5,000 Units Oral Daily   ??? citalopram 40 mg G-tube Nightly   ??? clindamycin   Topical BID   ??? esomeprazole  40 mg G-tube Daily   ??? fluticasone propionate  2 spray Each Nare Daily   ??? gabapentin  300 mg G-tube TID   ??? ipratropium  500 mcg Nebulization 4x Daily (RT)   ??? levothyroxine  75 mcg G-tube Daily   ??? minocycline  100 mg G-tube Nightly   ??? mirtazapine  15 mg G-tube Nightly   ??? montelukast  10 mg Oral Nightly   ??? mupirocin   Topical TID   ??? sodium chloride  4 mL Nebulization 4x Daily (RT)   ??? tobramycin (PF)  300 mg Nebulization BID (RT)   ??? triamcinolone   Topical BID   ??? valproate  250 mg G-tube Q AM   ??? valproate  750 mg Oral Nightly     Continuous Infusions:    ___________________________________________      Tito Dine MD  [facility].

## 2018-08-11 NOTE — Unmapped (Signed)
Hospitalist Daily Progress Note     LOS: 0 days under inpatient status    Assessment/Plan:  Principal Problem:    Social problem  Active Problems:    Asthma    Slow transit constipation    Dysphagia    Esophageal reflux    Hypothyroidism    Class 1 obesity in adult    Seizures (CMS-HCC)    Post traumatic stress disorder (PTSD)    Chronic tracheobronchitis (CMS-HCC)    Tracheostomy dependence (CMS-HCC)    VATER syndrome    Muscle spasms of both lower extremities    Enthesopathy of right hip region    Acne vulgaris    PEG (percutaneous endoscopic gastrostomy) status (CMS-HCC)    History of home ventilator (CMS-HCC)    Bipolar 1 disorder (CMS-HCC)    Esophageal achalasia    Cellulitis of left hand    Central sleep apnea  Resolved Problems:    * No resolved hospital problems. *         *VATER syndrome w associated tracheobronchomalacia, trach dependence.  Stable.  Now w resp path panel + for rhinovirus.  Known achalasia, esophageal stenosis.  S/P EGD w dilation of 2 stenoses 8/30.   -Continue home arfomoterol, budesonide, TOBI nebs, 3% saline nebs.   -Continue azithro MWF.   -Continue Trilogy home vent qhs.   -Continue PPI.   -F/U resp culture.    *Bipolar.  Stable.  -Continue citalopram, mirtazapine, VPA.     *Hypothyroid.   -Continue levothyroxine.     *Acne.   -Continue home minocycline.     *L hand cellulitis.  Resolved.  (Sulfa-TMP 8/16-8/23)     *DVT prophylaxis.   -Ambulation.     *Dispo.  Stepdown status due to qhs vent requirement.  Lapse in Medicaid that has led to loss of required home nursing at night due to need for Trilogy vent and trach.  Though he does have a roommate, that roommate is reportedly not there every day, and he has no other family in the area who can stay w him at night.  SW assisting.  Now reportedly w Medicaid reinstated.      Please page the [facility]) pager at [phone] with questions.        Consultants:   1.  GI (off)    Pending labs:    Order Current Status Lower Respiratory Culture Preliminary result            Subjective:   Promethazine x 1, cyclobenzaprine x 1 in last 24 hrs.  Feels under the weather.  No SOB at rest, no chest pain, no nausea currently.      Objective:   Physical Exam:  General: Lying comfortably in bed, no acute distress.  Able to walk around.   Eyes: Anicteric sclerae, no conjunctival injection.     ENT: Mucous membranes moist.  Normal appearing ears, nose.   Neck: Trach with PM valve in place.   Respiratory: Normal respiratory effort.  Occasional exp wheeze on L.  Some coarse insp breath sounds that are not common for him when well.    Cardiovascular: Regular rhythm w/o murmur.  No lower extremity edema.   Gastrointestinal: Soft, nontender.   Skin: No obvious rashes or breakdown.  Warm, dry.   Musculoskeletal: Motor strength in all extremities grossly intact.   Psychiatric: Alert.  Answers questions appropriately.  Judgment and insight intact.   Neurologic: Extraocular movements intact, no facial asymmetry.  No gross sensory deficits.  Vital signs in last 24 hours:  Temp:  [36.6 ??C (97.9 ??F)-37 ??C (98.6 ??F)] 37 ??C (98.6 ??F)  Heart Rate:  [98-118] 98  SpO2 Pulse:  [117] 117  Resp:  [18-20] 20  BP: (133-162)/(78-89) 159/86  MAP (mmHg):  [96-111] 111  FiO2 (%):  [28 %] 28 %  SpO2:  [96 %-99 %] 97 %    Intake/Output last 24 hours:    Intake/Output Summary (Last 24 hours) at 08/11/2018 0529  Last data filed at 08/10/2018 1200  Gross per 24 hour   Intake 140 ml   Output 650 ml   Net -510 ml       Most recent recorded weights:  Last 5 Recorded Weights    07/29/18 0420 08/06/18 1236   Weight: 96.1 kg (211 lb 12.8 oz) 95.7 kg (211 lb)       Medications:   Scheduled Meds:  ??? albuterol  2.5 mg Nebulization 4x Daily (RT)   ??? arformoterol  15 mcg Nebulization BID   ??? azithromycin  500 mg G-tube Q MWF   ??? budesonide  1 mg Nebulization BID   ??? cetirizine  10 mg G-tube QPM   ??? cholecalciferol (vitamin D3)  5,000 Units Oral Daily   ??? citalopram  40 mg G-tube Nightly   ??? clindamycin   Topical BID   ??? esomeprazole  40 mg G-tube Daily   ??? fluticasone propionate  2 spray Each Nare Daily   ??? gabapentin  300 mg G-tube TID   ??? ipratropium  500 mcg Nebulization 4x Daily (RT)   ??? levothyroxine  75 mcg G-tube Daily   ??? minocycline  100 mg G-tube Nightly   ??? mirtazapine  15 mg G-tube Nightly   ??? montelukast  10 mg Oral Nightly   ??? mupirocin   Topical TID   ??? sodium chloride  4 mL Nebulization 4x Daily (RT)   ??? tobramycin (PF)  300 mg Nebulization BID (RT)   ??? triamcinolone   Topical BID   ??? valproate  250 mg G-tube Q AM   ??? valproate  750 mg Oral Nightly

## 2018-08-11 NOTE — Unmapped (Signed)
Pt received scheduled tx's as ordered, pt also sx by RT for small amount of pale thin secretions pt stated today feeling SOB and change in sputum production color RT notified RN and, sputum sample sent

## 2018-08-11 NOTE — Unmapped (Signed)
Patient received on room air and place on vent QHS. Patient trach is midline and secure with minimal secretions.All scheduled meds given as ordered. No complications or distress noted this shift.

## 2018-08-12 NOTE — Unmapped (Signed)
Pt refused cough assist for mucus clearance.

## 2018-08-12 NOTE — Unmapped (Signed)
No changes to current plan of care.

## 2018-08-12 NOTE — Unmapped (Signed)
Pt remains neurologically intact; oriented x's 4.  Overall, general body aches and mild lethargy due to rhinovirus.  Discomfort eased with prn motrin and flexeril.  Hemodynamically, vital signs are stable; no fevers.  Lung sounds rhonci to coarse; on home vent at night.  Room air during the day with speaking valve.  Bowel sounds present; appetite sufficient.  BM last night via smog enema.  No new s/s of skin breakdown.  Will continue to monitor.    Problem: Adult Inpatient Plan of Care  Goal: Plan of Care Review  Outcome: Progressing  Goal: Patient-Specific Goal (Individualization)  Outcome: Progressing  Flowsheets  Taken 08/07/2018 1555 by Orlinda Blalock, RN  Patient-Specific Goals (Include Timeframe): Return to baseline sablization prior to admission.  Individualized Care Needs: Care clustered.  Encourage activity, frequent ambulation and independent ADLs  Taken 07/31/2018 0139 by Althea Grimmer, RN  Anxieties, Fears or Concerns: Pt denies anxiety     Problem: Asthma Comorbidity  Goal: Maintenance of Asthma Control  Outcome: Progressing     Problem: Obstructive Sleep Apnea Risk or Actual (Comorbidity Management)  Goal: Unobstructed Breathing During Sleep  Outcome: Progressing     Problem: Skin and Tissue Injury (Mechanical Ventilation, Invasive)  Goal: Absence of Device-Related Skin and Tissue Injury  Outcome: Progressing     Problem: Ventilator-Induced Lung Injury (Mechanical Ventilation, Invasive)  Goal: Absence of Ventilator-Induced Lung Injury  Outcome: Progressing

## 2018-08-12 NOTE — Unmapped (Signed)
Hospitalist Daily Progress Note     LOS: 0 days under inpatient status    Assessment/Plan:  Principal Problem:    Social problem  Active Problems:    Asthma    Slow transit constipation    Dysphagia    Esophageal reflux    Hypothyroidism    Class 1 obesity in adult    Seizures (CMS-HCC)    Post traumatic stress disorder (PTSD)    Chronic tracheobronchitis (CMS-HCC)    Tracheostomy dependence (CMS-HCC)    VATER syndrome    Muscle spasms of both lower extremities    Enthesopathy of right hip region    Acne vulgaris    PEG (percutaneous endoscopic gastrostomy) status (CMS-HCC)    History of home ventilator (CMS-HCC)    Bipolar 1 disorder (CMS-HCC)    Esophageal achalasia    Cellulitis of left hand    Central sleep apnea  Resolved Problems:    * No resolved hospital problems. *         *VATER syndrome w associated tracheobronchomalacia, trach dependence.  Stable.  Now w rhinovirus.  Known achalasia, esophageal stenosis.  S/P EGD w dilation of 2 stenoses 8/30.   -Continue home arfomoterol, budesonide, TOBI nebs, 3% saline nebs.   -Continue azithro MWF.   -Continue Trilogy home vent qhs.   -Airway clearance w Metaneb per his request.  -Continue PPI.   -F/U resp culture.  ??  *Bipolar.  Stable.  -Continue citalopram, mirtazapine, VPA.   ??  *Hypothyroid.  Stable.  -Continue levothyroxine.   ??  *Acne.   -Continue home minocycline.   ??  *L hand cellulitis.  Resolved.  (Sulfa-TMP 8/16-8/23)   ??  *DVT prophylaxis.   -Ambulation.   ??  *Dispo.  Stepdown status due to qhs vent requirement.  Medicaid approved w funding for private-duty nursing secured; awaiting funding for Trilogy home vent.  SW helping me.  ??  ??  Please page the [facility]) pager at [phone] with questions.  ??  ??  ??  Consultants:   1.  GI (off)    Pending labs:    Order Current Status    Lower Respiratory Culture Preliminary result            Subjective:   SMOG enema x 2, promethazine x 1, ibuprofen x 1, cyclobenzaprine x 2, acetaminophen x 1 in last 24 hrs.  Still under the weather but eating better.  No new SOB.  No vomiting.      Objective:   Physical Exam:  General: Sitting comfortably in bed, no acute distress.   Eyes: Anicteric sclerae, no conjunctival injection.     ENT: Mucous membranes moist.     Neck: Trach w PM valve.   Respiratory: Normal respiratory effort.  Clearer than yesterday w no significant coarse insp breath sounds.   Cardiovascular: Regular rhythm w/o murmur.    No lower extremity edema.   Gastrointestinal: Soft, nondistended.   Skin: No obvious rashes or breakdown.  Warm, dry.   Musculoskeletal: Motor strength in all extremities grossly intact.   Psychiatric: Alert.  Answers questions appropriately.  Judgment and insight intact.   Neurologic: Extraocular movements intact, no facial asymmetry.  No gross sensory deficits.         Vital signs in last 24 hours:  Temp:  [36.5 ??C (97.7 ??F)-36.7 ??C (98.1 ??F)] 36.6 ??C (97.9 ??F)  Heart Rate:  [84-113] 113  SpO2 Pulse:  [94-112] 112  Resp:  [14-18] 18  BP: (145-168)/(82-102)  168/102  MAP (mmHg):  [101-113] 111  FiO2 (%):  [28 %] 28 %  SpO2:  [96 %-100 %] 100 %    Intake/Output last 24 hours:    Intake/Output Summary (Last 24 hours) at 08/12/2018 0533  Last data filed at 08/11/2018 1822  Gross per 24 hour   Intake 1220 ml   Output 1300 ml   Net -80 ml       Most recent recorded weights:  Last 5 Recorded Weights    07/29/18 0420 08/06/18 1236   Weight: 96.1 kg (211 lb 12.8 oz) 95.7 kg (211 lb)       Medications:   Scheduled Meds:  ??? albuterol  2.5 mg Nebulization 4x Daily (RT)   ??? arformoterol  15 mcg Nebulization BID   ??? azithromycin  500 mg G-tube Q MWF   ??? budesonide  1 mg Nebulization BID   ??? cetirizine  10 mg G-tube QPM   ??? cholecalciferol (vitamin D3)  5,000 Units Oral Daily   ??? citalopram  40 mg G-tube Nightly   ??? clindamycin   Topical BID   ??? esomeprazole  40 mg G-tube Daily   ??? fluticasone propionate  2 spray Each Nare Daily   ??? gabapentin  300 mg G-tube TID   ??? ipratropium  500 mcg Nebulization 4x Daily (RT)   ??? levothyroxine  75 mcg G-tube Daily   ??? minocycline  100 mg G-tube Nightly   ??? mirtazapine  15 mg G-tube Nightly   ??? montelukast  10 mg Oral Nightly   ??? mupirocin   Topical TID   ??? sodium chloride  4 mL Nebulization 4x Daily (RT)   ??? tobramycin (PF)  300 mg Nebulization BID (RT)   ??? triamcinolone   Topical BID   ??? valproate  250 mg G-tube Q AM   ??? valproate  750 mg Oral Nightly

## 2018-08-12 NOTE — Unmapped (Signed)
Pt is A & O X 4. VSS. Denies any pain. Independent patient ambulates in the to hallway frequently. Remains stable. Uneventful night. Cont to monitor. See flowsheet for full assessment.     Problem: Adult Inpatient Plan of Care  Goal: Plan of Care Review  Outcome: Progressing  Goal: Patient-Specific Goal (Individualization)  Outcome: Progressing  Goal: Absence of Hospital-Acquired Illness or Injury  Outcome: Progressing  Goal: Optimal Comfort and Wellbeing  Outcome: Progressing  Goal: Readiness for Transition of Care  Outcome: Progressing  Goal: Rounds/Family Conference  Outcome: Progressing     Problem: Asthma Comorbidity  Goal: Maintenance of Asthma Control  Outcome: Progressing     Problem: Obstructive Sleep Apnea Risk or Actual (Comorbidity Management)  Goal: Unobstructed Breathing During Sleep  Outcome: Progressing     Problem: Skin and Tissue Injury (Mechanical Ventilation, Invasive)  Goal: Absence of Device-Related Skin and Tissue Injury  Outcome: Progressing     Problem: Ventilator-Induced Lung Injury (Mechanical Ventilation, Invasive)  Goal: Absence of Ventilator-Induced Lung Injury  Outcome: Progressing

## 2018-08-12 NOTE — Unmapped (Signed)
PMV removed and placed at bedside, pt placed on trilogy qhs, no complications or distress noted.

## 2018-08-13 NOTE — Unmapped (Signed)
Hospitalist Daily Progress Note     LOS: 0 days under inpatient status    Assessment/Plan:  Principal Problem:    Social problem  Active Problems:    Asthma    Slow transit constipation    Dysphagia    Esophageal reflux    Hypothyroidism    Class 1 obesity in adult    Seizures (CMS-HCC)    Post traumatic stress disorder (PTSD)    Chronic tracheobronchitis (CMS-HCC)    Tracheostomy dependence (CMS-HCC)    VATER syndrome    Muscle spasms of both lower extremities    Enthesopathy of right hip region    Acne vulgaris    PEG (percutaneous endoscopic gastrostomy) status (CMS-HCC)    History of home ventilator (CMS-HCC)    Bipolar 1 disorder (CMS-HCC)    Esophageal achalasia    Cellulitis of left hand    Central sleep apnea  Resolved Problems:    * No resolved hospital problems. *         *VATER syndrome w associated tracheobronchomalacia, trach dependence.  Stable.????Now w rhinovirus acquired in hospital.????Known achalasia, esophageal stenosis. ??S/P EGD w dilation of 2 stenoses 8/30.   -Continue home arfomoterol, budesonide, TOBI nebs, 3% saline nebs.   -Continue azithro MWF.   -Continue Trilogy home vent qhs.   -Airway clearance w Metaneb.  -Continue PPI.??  -F/U resp culture.  ??  *Bipolar.  Stable.  -Continue citalopram, mirtazapine, VPA.   ??  *Hypothyroid.  Stable.  -Continue levothyroxine.   ??  *Acne.   -Continue home minocycline.   ??  *L hand cellulitis. ??Resolved. ??(Sulfa-TMP 8/16-8/23)   ??  *DVT prophylaxis.   -Ambulation.   ??  *Dispo. ??Stepdown status due to qhs vent requirement.  Medicaid approved w funding for private-duty nursing secured; awaiting funding for Trilogy home vent.  SW helping.    ??  ??  Please page the??[place] [facility]) pager at [phone] with questions.  ??  ??  ??  Consultants:??  1.????GI (off)    Pending labs:    Order Current Status    Lower Respiratory Culture Preliminary result            Subjective:   Tizanidine x 2, promethazine x 1, ibuprofen x 1 in last 24 hrs.  Feeling a little better but still having some malaise from cold.  No SOB or vomiting or diarrhea.      Objective:   Physical Exam:  General: Sitting comfortably in bed, no acute distress.   Eyes: Anicteric sclerae, no conjunctival injection.     ENT: Mucous membranes moist.    Neck: PM valve on trach.   Respiratory: Normal respiratory effort.  Inspiratory breath sounds a little coarse today but not as pronounced as 2d ago.  No wheezes.    Cardiovascular: Regular rhythm w/o murmur.   No lower extremity edema.   Gastrointestinal: Nondistended.   Skin: No obvious rashes or breakdown.  Warm, dry.   Musculoskeletal: Motor strength in all extremities grossly intact.   Psychiatric: Alert.  Answers questions appropriately.  Judgment and insight intact.   Neurologic: Extraocular movements intact, no facial asymmetry.  No gross sensory deficits.         Vital signs in last 24 hours:  Temp:  [36.5 ??C (97.7 ??F)-37 ??C (98.6 ??F)] 37 ??C (98.6 ??F)  Heart Rate:  [78-110] 78  SpO2 Pulse:  [89] 89  Resp:  [11-20] 11  BP: (121-162)/(69-86) 162/86  MAP (mmHg):  [81-97] 97  FiO2 (%):  [  28 %] 28 %  SpO2:  [95 %-99 %] 99 %    Intake/Output last 24 hours:    Intake/Output Summary (Last 24 hours) at 08/13/2018 0534  Last data filed at 08/12/2018 2000  Gross per 24 hour   Intake 690 ml   Output 1250 ml   Net -560 ml       Most recent recorded weights:  Last 5 Recorded Weights    07/29/18 0420 08/06/18 1236   Weight: 96.1 kg (211 lb 12.8 oz) 95.7 kg (211 lb)       Medications:   Scheduled Meds:  ??? albuterol  2.5 mg Nebulization 4x Daily (RT)   ??? arformoterol  15 mcg Nebulization BID   ??? azithromycin  500 mg G-tube Q MWF   ??? budesonide  1 mg Nebulization BID   ??? cetirizine  10 mg G-tube QPM   ??? cholecalciferol (vitamin D3)  5,000 Units Oral Daily   ??? citalopram  40 mg G-tube Nightly   ??? clindamycin   Topical BID   ??? esomeprazole  40 mg G-tube Daily   ??? fluticasone propionate  2 spray Each Nare Daily   ??? gabapentin  300 mg G-tube TID   ??? ipratropium  500 mcg Nebulization 4x Daily (RT)   ??? levothyroxine  75 mcg G-tube Daily   ??? minocycline  100 mg G-tube Nightly   ??? mirtazapine  15 mg G-tube Nightly   ??? montelukast  10 mg Oral Nightly   ??? mupirocin   Topical TID   ??? sodium chloride  4 mL Nebulization 4x Daily (RT)   ??? tobramycin (PF)  300 mg Nebulization BID (RT)   ??? triamcinolone   Topical BID   ??? valproate  250 mg G-tube Q AM   ??? valproate  750 mg Oral Nightly

## 2018-08-13 NOTE — Unmapped (Signed)
No change from previous shift. Pt resting comfortably in bed. A & O X 4. VSS. Denies any pain. Independent patient ambulates in the to hallway frequently. Remains stable. Uneventful night. Cont to monitor. See flowsheet for full assessment.   Problem: Adult Inpatient Plan of Care  Goal: Plan of Care Review  Outcome: Progressing  Goal: Patient-Specific Goal (Individualization)  Outcome: Progressing  Goal: Absence of Hospital-Acquired Illness or Injury  Outcome: Progressing  Goal: Optimal Comfort and Wellbeing  Outcome: Progressing  Goal: Readiness for Transition of Care  Outcome: Progressing  Goal: Rounds/Family Conference  Outcome: Progressing     Problem: Asthma Comorbidity  Goal: Maintenance of Asthma Control  Outcome: Progressing     Problem: Obstructive Sleep Apnea Risk or Actual (Comorbidity Management)  Goal: Unobstructed Breathing During Sleep  Outcome: Progressing     Problem: Skin and Tissue Injury (Mechanical Ventilation, Invasive)  Goal: Absence of Device-Related Skin and Tissue Injury  Outcome: Progressing     Problem: Ventilator-Induced Lung Injury (Mechanical Ventilation, Invasive)  Goal: Absence of Ventilator-Induced Lung Injury  Outcome: Progressing

## 2018-08-14 NOTE — Unmapped (Signed)
Hospitalist Daily Progress Note     LOS: 0 days under inpatient status    Assessment/Plan:  Principal Problem:    Social problem  Active Problems:    Asthma    Slow transit constipation    Dysphagia    Esophageal reflux    Hypothyroidism    Class 1 obesity in adult    Seizures (CMS-HCC)    Post traumatic stress disorder (PTSD)    Chronic tracheobronchitis (CMS-HCC)    Tracheostomy dependence (CMS-HCC)    VATER syndrome    Muscle spasms of both lower extremities    Enthesopathy of right hip region    Acne vulgaris    PEG (percutaneous endoscopic gastrostomy) status (CMS-HCC)    History of home ventilator (CMS-HCC)    Bipolar 1 disorder (CMS-HCC)    Esophageal achalasia    Cellulitis of left hand    Central sleep apnea  Resolved Problems:    * No resolved hospital problems. *         *VATER syndrome w associated tracheobronchomalacia, trach dependence.  Stable.????Now w??rhinovirus acquired in hospital.????Known achalasia, esophageal stenosis. ??S/P EGD w dilation of 2 stenoses 8/30.   -Continue home arfomoterol, budesonide, TOBI nebs, 3% saline nebs.   -Continue azithro MWF.   -Continue Trilogy home vent qhs.??  -Airway clearance w Metaneb.  -Continue PPI.??  -F/U resp culture.  ??  *Bipolar.  Stable.  -Continue citalopram, mirtazapine, VPA.   ??  *Hypothyroid.????Stable.  -Continue levothyroxine.   ??  *Acne.   -Continue home minocycline.   ??  *L hand cellulitis. ??Resolved. ??(Sulfa-TMP 8/16-8/23)   ??  *DVT prophylaxis.   -Ambulation.   ??  *Dispo. ??Stepdown status due to qhs vent requirement.????Now approved for Medicaid w funding secured for private-duty nursing at night as usual for him; awaiting approval of funding for Trilogy home vent. ??SW helping.    ??  ??  Please page the??[place] [facility]ist [facility]) pager at [phone] with questions.  ??  ??  ??  Consultants:??  1.????GI (off)        Subjective:   Tizanidine x 2, promethazine x 2 in last 24 hrs.  Feeling better.  No nausea, no vomiting, no constipation.      Objective: Physical Exam:  General: Sitting comfortably in bed, no acute distress.   Eyes: Anicteric sclerae, no conjunctival injection.    ENT: Mucous membranes moist.     Neck: Trach in place.   Respiratory: Normal respiratory effort.  No inspiratory crackles, no coarse inspiratory sounds, few bilateral end-exp wheezes.    Cardiovascular: Regular rhythm w/o murmur.  No lower extremity edema.   Gastrointestinal: Nondistended.   Skin: Healing ulcer on L hand where there was previously cellulitis.  Warm, dry.   Musculoskeletal: Motor strength in all extremities grossly intact.   Psychiatric: Alert.  Answers questions appropriately.  Judgment and insight intact.   Neurologic: Extraocular movements intact, no facial asymmetry.  No gross sensory deficits.         Vital signs in last 24 hours:  Temp:  [36.6 ??C (97.9 ??F)-36.8 ??C (98.3 ??F)] 36.7 ??C (98.1 ??F)  Heart Rate:  [82-101] 94  SpO2 Pulse:  [106] 106  Resp:  [12-18] 14  BP: (115-139)/(51-76) 115/51  MAP (mmHg):  [74-91] 74  FiO2 (%):  [28 %] 28 %  SpO2:  [95 %-100 %] 95 %    Intake/Output last 24 hours:    Intake/Output Summary (Last 24 hours) at 08/14/2018 0533  Last data filed at 08/14/2018  0400  Gross per 24 hour   Intake 1310 ml   Output 1350 ml   Net -40 ml       Most recent recorded weights:  Last 5 Recorded Weights    07/29/18 0420 08/06/18 1236   Weight: 96.1 kg (211 lb 12.8 oz) 95.7 kg (211 lb)       Medications:   Scheduled Meds:  ??? albuterol  2.5 mg Nebulization 4x Daily (RT)   ??? arformoterol  15 mcg Nebulization BID   ??? azithromycin  500 mg G-tube Q MWF   ??? budesonide  1 mg Nebulization BID   ??? cetirizine  10 mg G-tube QPM   ??? cholecalciferol (vitamin D3)  5,000 Units Oral Daily   ??? citalopram  40 mg G-tube Nightly   ??? clindamycin   Topical BID   ??? esomeprazole  40 mg G-tube Daily   ??? fluticasone propionate  2 spray Each Nare Daily   ??? gabapentin  300 mg G-tube TID   ??? ipratropium  500 mcg Nebulization 4x Daily (RT)   ??? levothyroxine  75 mcg G-tube Daily   ??? minocycline  100 mg G-tube Nightly   ??? mirtazapine  15 mg G-tube Nightly   ??? montelukast  10 mg Oral Nightly   ??? mupirocin   Topical TID   ??? sodium chloride  4 mL Nebulization 4x Daily (RT)   ??? tobramycin (PF)  300 mg Nebulization BID (RT)   ??? triamcinolone   Topical BID   ??? valproate  250 mg G-tube Q AM   ??? valproate  750 mg Oral Nightly

## 2018-08-14 NOTE — Unmapped (Signed)
Problem: Adult Inpatient Plan of Care  Goal: Plan of Care Review  Outcome: Progressing  Note:   No acute events this shift. Ate 75% dinner tonight. Successful BM with Smog enema. Independent in room, PMV/RA w/awake & Trilogy vent w/asleep without complications. Productive cough with moderate yellow sputum. VSS, afebrile. Denies pain, pt reports relief in nausea and muscle spasms with PRNs - see MAR.  Goal: Patient-Specific Goal (Individualization)  Outcome: Progressing  Goal: Absence of Hospital-Acquired Illness or Injury  Outcome: Progressing  Goal: Optimal Comfort and Wellbeing  Outcome: Progressing  Goal: Readiness for Transition of Care  Outcome: Progressing  Goal: Rounds/Family Conference  Outcome: Progressing     Problem: Asthma Comorbidity  Goal: Maintenance of Asthma Control  Outcome: Progressing     Problem: Obstructive Sleep Apnea Risk or Actual (Comorbidity Management)  Goal: Unobstructed Breathing During Sleep  Outcome: Progressing     Problem: Skin and Tissue Injury (Mechanical Ventilation, Invasive)  Goal: Absence of Device-Related Skin and Tissue Injury  Outcome: Progressing     Problem: Ventilator-Induced Lung Injury (Mechanical Ventilation, Invasive)  Goal: Absence of Ventilator-Induced Lung Injury  Outcome: Progressing

## 2018-08-14 NOTE — Unmapped (Signed)
Patient  stable on trach collar. No changes otherwise.will continue to monitor  Problem: Adult Inpatient Plan of Care  Goal: Plan of Care Review  Outcome: Progressing  Goal: Patient-Specific Goal (Individualization)  Outcome: Progressing  Goal: Absence of Hospital-Acquired Illness or Injury  Outcome: Progressing  Goal: Optimal Comfort and Wellbeing  Outcome: Progressing  Goal: Readiness for Transition of Care  Outcome: Progressing  Goal: Rounds/Family Conference  Outcome: Progressing     Problem: Asthma Comorbidity  Goal: Maintenance of Asthma Control  Outcome: Progressing     Problem: Obstructive Sleep Apnea Risk or Actual (Comorbidity Management)  Goal: Unobstructed Breathing During Sleep  Outcome: Progressing     Problem: Skin and Tissue Injury (Mechanical Ventilation, Invasive)  Goal: Absence of Device-Related Skin and Tissue Injury  Outcome: Progressing     Problem: Ventilator-Induced Lung Injury (Mechanical Ventilation, Invasive)  Goal: Absence of Ventilator-Induced Lung Injury  Outcome: Progressing

## 2018-08-14 NOTE — Unmapped (Signed)
Pt remains on RA during day with PMV, PMV removed for sleep. Pt rested on  Trilogy this HS, inhaled medications administered without adverse reaction, performed metaneb for mucus clearance, small thick yellow secretions noted, with no complications or distress noted this shift.

## 2018-08-15 DIAGNOSIS — R1310 Dysphagia, unspecified: Principal | ICD-10-CM

## 2018-08-15 LAB — BASIC METABOLIC PANEL
ANION GAP: 10 mmol/L (ref 9–15)
BLOOD UREA NITROGEN: 8 mg/dL (ref 7–21)
CHLORIDE: 101 mmol/L (ref 98–107)
CO2: 27 mmol/L (ref 22.0–30.0)
CREATININE: 0.81 mg/dL (ref 0.70–1.30)
EGFR CKD-EPI AA MALE: >90 mL/min/{1.73_m2} (ref >=60)
EGFR CKD-EPI NON-AA MALE: >90 mL/min/{1.73_m2} (ref >=60)
GLUCOSE RANDOM: 176 mg/dL (ref 65–179)
POTASSIUM: 4.3 mmol/L (ref 3.5–5.0)
SODIUM: 138 mmol/L (ref 135–145)

## 2018-08-15 LAB — CBC
HEMOGLOBIN: 11.6 g/dL — ABNORMAL LOW (ref 13.5–17.5)
MEAN CORPUSCULAR HEMOGLOBIN CONC: 33.2 g/dL (ref 31.0–37.0)
MEAN CORPUSCULAR HEMOGLOBIN: 24.6 pg — ABNORMAL LOW (ref 26.0–34.0)
MEAN CORPUSCULAR VOLUME: 74.3 fL — ABNORMAL LOW (ref 80.0–100.0)
MEAN PLATELET VOLUME: 6.8 fL — ABNORMAL LOW (ref 7.0–10.0)
PLATELET COUNT: 264 10*9/L (ref 150–440)
RED BLOOD CELL COUNT: 4.72 10*12/L (ref 4.50–5.90)
RED CELL DISTRIBUTION WIDTH: 17.7 % — ABNORMAL HIGH (ref 12.0–15.0)
WBC ADJUSTED: 9.4 10*9/L (ref 4.5–11.0)

## 2018-08-15 LAB — HEMOGLOBIN: Lab: 11.6 — ABNORMAL LOW

## 2018-08-15 LAB — BUN / CREAT RATIO: Urea nitrogen/Creatinine:MRto:Pt:Ser/Plas:Qn:: 10

## 2018-08-15 NOTE — Unmapped (Signed)
Patient in no acute distress, Patient has Smog enema at  bedside and was used after lunch today.  Patient VVS, afebrile, no issues with pain today. States not feeing well today.

## 2018-08-15 NOTE — Unmapped (Signed)
Patient in no acute distress, Patient has Smog enema at  bedside if needed. Patient VVS, afebrile, no issues with pain today.     Problem: Adult Inpatient Plan of Care  Goal: Plan of Care Review  Outcome: Ongoing - Unchanged  Goal: Patient-Specific Goal (Individualization)  Outcome: Ongoing - Unchanged  Goal: Absence of Hospital-Acquired Illness or Injury  Outcome: Ongoing - Unchanged  Goal: Optimal Comfort and Wellbeing  Outcome: Ongoing - Unchanged  Goal: Readiness for Transition of Care  Outcome: Ongoing - Unchanged  Goal: Rounds/Family Conference  Outcome: Ongoing - Unchanged     Problem: Asthma Comorbidity  Goal: Maintenance of Asthma Control  Outcome: Ongoing - Unchanged     Problem: Obstructive Sleep Apnea Risk or Actual (Comorbidity Management)  Goal: Unobstructed Breathing During Sleep  Outcome: Ongoing - Unchanged     Problem: Skin and Tissue Injury (Mechanical Ventilation, Invasive)  Goal: Absence of Device-Related Skin and Tissue Injury  Outcome: Ongoing - Unchanged     Problem: Ventilator-Induced Lung Injury (Mechanical Ventilation, Invasive)  Goal: Absence of Ventilator-Induced Lung Injury  Outcome: Ongoing - Unchanged

## 2018-08-15 NOTE — Unmapped (Signed)
Hospitalist Daily Progress Note     LOS: 0 days under inpatient status    Assessment/Plan:  Principal Problem:    Social problem  Active Problems:    Asthma    Slow transit constipation    Dysphagia    Esophageal reflux    Hypothyroidism    Class 1 obesity in adult    Seizures (CMS-HCC)    Post traumatic stress disorder (PTSD)    Chronic tracheobronchitis (CMS-HCC)    Tracheostomy dependence (CMS-HCC)    VATER syndrome    Muscle spasms of both lower extremities    Enthesopathy of right hip region    Acne vulgaris    PEG (percutaneous endoscopic gastrostomy) status (CMS-HCC)    History of home ventilator (CMS-HCC)    Bipolar 1 disorder (CMS-HCC)    Esophageal achalasia    Cellulitis of left hand    Central sleep apnea  Resolved Problems:    * No resolved hospital problems. *         *VATER syndrome w associated tracheobronchomalacia, trach dependence.  Stable.????Now w??rhinovirus??acquired in hospital; resp culture neg.????Known achalasia, esophageal stenosis. ??S/P EGD w dilation of 2 stenoses 8/30.   -Continue home arfomoterol, budesonide, TOBI nebs, 3% saline nebs.   -Continue azithro MWF.   -Continue Trilogy home vent qhs.??  -Airway clearance w Metaneb.  -Continue PPI.??  ??  *Bipolar.????Stable.  -Continue citalopram, mirtazapine, VPA.   ??  *Hypothyroid.????Stable.  -Continue levothyroxine.   ??  *Acne.   -Continue home minocycline.   ??  *L hand cellulitis. ??Resolved. ??(Sulfa-TMP 8/16-8/23)   ??  *DVT prophylaxis.   -Ambulation.   ??  *Dispo. ??Stepdown status due to qhs vent requirement.????Now approved for Medicaid w funding secured for private-duty nursing at night as usual for him; awaiting approval of funding for Trilogy home vent. ??SW helping.????  ??  ??  Please page the??[facility] [facility]) pager at [phone] with questions.  ??  ??  ??  Consultants:??  1.????GI (off)    Pending labs:    Order Current Status    Lower Respiratory Culture Preliminary result            Subjective:   Tizanidine x 2, senna x 2, promethazine x 2, lorazepam x 2, ibuprofen x 2, acetaminophen x 2 in last 24 hrs.  Vomited some this AM.  Not feeling too good.  Reports not eating dinner last night.  Says secretions are thicker though documentation notes clear non-productive cough.  No chest pain, SOB.  Slept well otherwise.      Objective:   Physical Exam:  General: Lying comfortably in bed, no acute distress but does look fatigued.   Eyes: Anicteric sclerae, no conjunctival injection.     ENT: Mucous membranes moist.     Neck: Trach w PM valve in place.   Respiratory: Normal respiratory effort.  No inspiratory crackles or inspiratory coarse sounds as before.  Few scattered B exp wheezes similar to yesterday.    Cardiovascular: Regular rhythm w/o murmur.  Radial pulses 2+ bilaterally.  No lower extremity edema.   Gastrointestinal: Bowel sounds present, soft, nontender, nondistended.    Skin: No obvious rashes or breakdown.  Warm, dry.   Musculoskeletal: Motor strength in all extremities grossly intact.   Psychiatric: Alert.  Answers questions appropriately.  Judgment and insight intact.   Neurologic: Extraocular movements intact, no facial asymmetry.  No gross sensory deficits.         Vital signs in last 24 hours:  Temp:  [  35.6 ??C (96 ??F)-37.6 ??C (99.7 ??F)] 35.6 ??C (96 ??F)  Heart Rate:  [80-110] 80  SpO2 Pulse:  [98-109] 109  Resp:  [12-20] 14  BP: (117-148)/(62-80) 117/75  MAP (mmHg):  [84-97] 84  FiO2 (%):  [28 %] 28 %  SpO2:  [92 %-100 %] 97 %    Intake/Output last 24 hours:    Intake/Output Summary (Last 24 hours) at 08/15/2018 0544  Last data filed at 08/15/2018 0400  Gross per 24 hour   Intake 1230 ml   Output 675 ml   Net 555 ml       Most recent recorded weights:  Last 5 Recorded Weights    07/29/18 0420 08/06/18 1236   Weight: 96.1 kg (211 lb 12.8 oz) 95.7 kg (211 lb)       Medications:   Scheduled Meds:  ??? albuterol  2.5 mg Nebulization 4x Daily (RT)   ??? arformoterol  15 mcg Nebulization BID   ??? azithromycin  500 mg G-tube Q MWF   ??? budesonide  1 mg Nebulization BID   ??? cetirizine  10 mg G-tube QPM   ??? cholecalciferol (vitamin D3)  5,000 Units Oral Daily   ??? citalopram  40 mg G-tube Nightly   ??? clindamycin   Topical BID   ??? esomeprazole  40 mg G-tube Daily   ??? fluticasone propionate  2 spray Each Nare Daily   ??? gabapentin  300 mg G-tube TID   ??? ipratropium  500 mcg Nebulization 4x Daily (RT)   ??? levothyroxine  75 mcg G-tube Daily   ??? minocycline  100 mg G-tube Nightly   ??? mirtazapine  15 mg G-tube Nightly   ??? montelukast  10 mg Oral Nightly   ??? mupirocin   Topical TID   ??? sodium chloride  4 mL Nebulization 4x Daily (RT)   ??? tobramycin (PF)  300 mg Nebulization BID (RT)   ??? triamcinolone   Topical BID   ??? valproate  250 mg G-tube Q AM   ??? valproate  750 mg Oral Nightly

## 2018-08-15 NOTE — Unmapped (Signed)
Pt remains on RA during day with PMV, PMV removed for sleep. Pt rested on  Trilogy this HS, inhaled medications administered without adverse reaction, performed metaneb for mucus clearance, refuse suction, with no complications or distress noted this shift.

## 2018-08-15 NOTE — Unmapped (Signed)
VSS. Afebrile. Pt states he feels flu like symptoms, chills and body aches. MD made aware. No further orders. A/O x4. No falls. UOP adequate. LBM 08/13/18. No changes. See documentation for more in depth charting. Continued monitoring during this admx.  Problem: Adult Inpatient Plan of Care  Goal: Plan of Care Review  Outcome: Ongoing - Unchanged  Goal: Patient-Specific Goal (Individualization)  Outcome: Ongoing - Unchanged  Goal: Absence of Hospital-Acquired Illness or Injury  Outcome: Ongoing - Unchanged  Goal: Optimal Comfort and Wellbeing  Outcome: Ongoing - Unchanged  Goal: Readiness for Transition of Care  Outcome: Ongoing - Unchanged  Goal: Rounds/Family Conference  Outcome: Ongoing - Unchanged     Problem: Asthma Comorbidity  Goal: Maintenance of Asthma Control  Outcome: Ongoing - Unchanged     Problem: Obstructive Sleep Apnea Risk or Actual (Comorbidity Management)  Goal: Unobstructed Breathing During Sleep  Outcome: Ongoing - Unchanged     Problem: Skin and Tissue Injury (Mechanical Ventilation, Invasive)  Goal: Absence of Device-Related Skin and Tissue Injury  Outcome: Ongoing - Unchanged     Problem: Ventilator-Induced Lung Injury (Mechanical Ventilation, Invasive)  Goal: Absence of Ventilator-Induced Lung Injury  Outcome: Ongoing - Unchanged

## 2018-08-16 LAB — BILIRUBIN DIRECT: Bilirubin.glucuronidated:MCnc:Pt:Ser/Plas:Qn:: <0.1

## 2018-08-16 LAB — HEPATIC FUNCTION PANEL
ALBUMIN: 3.7 g/dL (ref 3.5–5.0)
ALT (SGPT): 77 U/L — ABNORMAL HIGH (ref 19–72)
AST (SGOT): 53 U/L (ref 19–55)
BILIRUBIN TOTAL: 0.4 mg/dL (ref 0.0–1.2)
PROTEIN TOTAL: 6.3 g/dL — ABNORMAL LOW (ref 6.5–8.3)

## 2018-08-16 NOTE — Unmapped (Signed)
Temp elevated. Pt had N/V. MD notified. Fluid given per order. Tylenol,phenergan administered per order. Pt feeling better, temp dropped. Resting comfortably in bed. On continue IV antibiotic. Remains stable, continue to monitor   Problem: Adult Inpatient Plan of Care  Goal: Plan of Care Review  08/16/2018 0727 by Terisa Starr, RN  Outcome: Progressing  08/16/2018 0726 by Terisa Starr, RN  Outcome: Progressing  Goal: Patient-Specific Goal (Individualization)  08/16/2018 0727 by Terisa Starr, RN  Outcome: Progressing  08/16/2018 0726 by Terisa Starr, RN  Outcome: Progressing  Goal: Absence of Hospital-Acquired Illness or Injury  08/16/2018 0727 by Terisa Starr, RN  Outcome: Progressing  08/16/2018 0726 by Terisa Starr, RN  Outcome: Progressing  Goal: Optimal Comfort and Wellbeing  08/16/2018 0727 by Terisa Starr, RN  Outcome: Progressing  08/16/2018 0726 by Terisa Starr, RN  Outcome: Progressing  Goal: Readiness for Transition of Care  08/16/2018 0727 by Terisa Starr, RN  Outcome: Progressing  08/16/2018 0726 by Terisa Starr, RN  Outcome: Progressing  Goal: Rounds/Family Conference  08/16/2018 0727 by Terisa Starr, RN  Outcome: Progressing  08/16/2018 0726 by Terisa Starr, RN  Outcome: Progressing     Problem: Asthma Comorbidity  Goal: Maintenance of Asthma Control  08/16/2018 0727 by Terisa Starr, RN  Outcome: Progressing  08/16/2018 0726 by Terisa Starr, RN  Outcome: Progressing     Problem: Obstructive Sleep Apnea Risk or Actual (Comorbidity Management)  Goal: Unobstructed Breathing During Sleep  08/16/2018 0727 by Terisa Starr, RN  Outcome: Progressing  08/16/2018 0726 by Terisa Starr, RN  Outcome: Progressing     Problem: Skin and Tissue Injury (Mechanical Ventilation, Invasive)  Goal: Absence of Device-Related Skin and Tissue Injury  08/16/2018 0727 by Terisa Starr, RN  Outcome: Progressing  08/16/2018 0726 by Terisa Starr, RN  Outcome: Progressing     Problem: Ventilator-Induced Lung Injury (Mechanical Ventilation, Invasive) Goal: Absence of Ventilator-Induced Lung Injury  08/16/2018 0727 by Terisa Starr, RN  Outcome: Progressing  08/16/2018 0726 by Terisa Starr, RN  Outcome: Progressing

## 2018-08-16 NOTE — Unmapped (Signed)
Problem: Adult Inpatient Plan of Care  Goal: Plan of Care Review  Outcome: Progressing  Goal: Patient-Specific Goal (Individualization)  Outcome: Progressing  Goal: Absence of Hospital-Acquired Illness or Injury  Outcome: Progressing  Goal: Optimal Comfort and Wellbeing  Outcome: Progressing  Goal: Readiness for Transition of Care  Outcome: Progressing  Goal: Rounds/Family Conference  Outcome: Progressing     Problem: Asthma Comorbidity  Goal: Maintenance of Asthma Control  Outcome: Progressing     Problem: Obstructive Sleep Apnea Risk or Actual (Comorbidity Management)  Goal: Unobstructed Breathing During Sleep  Outcome: Progressing     Problem: Skin and Tissue Injury (Mechanical Ventilation, Invasive)  Goal: Absence of Device-Related Skin and Tissue Injury  Outcome: Progressing     Problem: Ventilator-Induced Lung Injury (Mechanical Ventilation, Invasive)  Goal: Absence of Ventilator-Induced Lung Injury  Outcome: Progressing

## 2018-08-16 NOTE — Unmapped (Signed)
PULMONARY CONSULT  NOTE      Patient: Cameron Baker([birth year])  Reason for consultation: Cameron Baker is a 27 y.o. male who is seen in consultation at the request of Cameron Koyanagi, MD for comprehensive evaluation of tracheobronchitis in the setting of a chronic trach and nocturnal ventilation.    Assessment and Recommendations:      Principal Problem:    Social problem  Active Problems:    Asthma    Slow transit constipation    Dysphagia    Esophageal reflux    Hypothyroidism    Class 1 obesity in adult    Seizures (CMS-HCC)    Post traumatic stress disorder (PTSD)    Chronic tracheobronchitis (CMS-HCC)    Tracheostomy dependence (CMS-HCC)    VATER syndrome    Muscle spasms of both lower extremities    Enthesopathy of right hip region    Acne vulgaris    PEG (percutaneous endoscopic gastrostomy) status (CMS-HCC)    History of home ventilator (CMS-HCC)    Bipolar 1 disorder (CMS-HCC)    Esophageal achalasia    Cellulitis of left hand    Central sleep apnea  Resolved Problems:    * No resolved hospital problems. *    His sputum does seem foul-smelling, but reassuring that his white count is not elevated. Probably need to add anaerobic coverage to antibiotics. No history of MRSA, so would be reasonable to simply use Zosyn or probably even Augmentin as he has no history of Pseudomonas either.    Recommendations:  - Continue antibiotics for 7 day course    We appreciate the opportunity to assist in the care of this patient.  Please page [phone] with any questions.    Cameron Hammock, MD    Subjective:      History of Present Illness:  Cameron Baker is a 27 y.o. male admitted for loss of insurance and nursing coverage at home. While awaiting current insurance, he developed a fever over the weekend and a change in sputum color.    Review of Systems: A comprehensive review of systems was performed and was negative except as above in HPI  Past Medical History:   Diagnosis Date   ??? ADHD    ??? Asthma    ??? Bipolar 1 disorder (CMS-HCC)    ??? Chronic tracheobronchitis (CMS-HCC)    ??? Constipation    ??? De Quervain's tenosynovitis    ??? Dysphagia    ??? Esophageal dysmotility    ??? Esophageal stricture    ??? GERD (gastroesophageal reflux disease)    ??? hypothyroid    ??? Seizures (CMS-HCC)    ??? Tracheobronchomalacia    ??? VATER syndrome    ??? Ventilator dependence (CMS-HCC)      Past Surgical History:   Procedure Laterality Date   ??? ANAL DILATION  01/21/2006   ??? ESOPHAGOGASTRODUODENOSCOPY      multiple   ??? HIP SURGERY Right 01/19/2017    shaved   ??? IR INSERT PORT AGE GREATER THAN 5 YRS  06/22/2018    IR INSERT PORT AGE GREATER THAN 5 YRS 06/22/2018 Cameron Labrum, MD IMG VIR [facility]   ??? LATERAL RECTUS RECESSION  11/15/2003   ??? NISSEN FUNDOPLICATION  2001    x 2 redos   ??? PORTACATH PLACEMENT  01/21/2006    taken out same year   ??? PR ESOPHAGEAL MOTILITY STUDY, MANOMETRY N/A 04/23/2018    Procedure: ESOPHAGEAL MOTILITY STUDY W/INT & REP;  Surgeon: Nurse-Based Giproc;  [facility]:  [facility];  Service: Gastroenterology   ??? PR UP GI ENDOSCOPY,BALL DIL,30MM N/A 02/22/2018    Procedure: UGI ENDO; W/BALLOON DILAT ESOPHAGUS (<30MM DIAM);  Surgeon: Cameron Bills, MD;  [facility]: [facility];  Service: Gastroenterology   ??? PR UP GI ENDOSCOPY,BALL DIL,30MM N/A 05/20/2018    Procedure: UGI ENDO; W/BALLOON DILAT ESOPHAGUS (<30MM DIAM);  Surgeon: Cameron Ravens, MD;  Location: [facility];  Service: Gastroenterology   ??? PR UP GI ENDOSCOPY,BALL DIL,30MM N/A 08/06/2018    Procedure: UGI ENDO; W/BALLOON DILAT ESOPHAGUS (<30MM DIAM);  Surgeon: Cameron Paras, MD;  Location: [facility];  Service: Gastroenterology   ??? REMOVAL VENOUS ACCESS PORT [facility] HISTORICAL RESULT)  11/03/2006   ??? STRABISMUS SURGERY Right    ??? TRACHEOSTOMY  1994   ??? WISDOM TOOTH EXTRACTION  04/2012   ??? WRIST SURGERY      3 times.      Medications reviewed in Epic  Allergies as of 07/28/2018 - Reviewed 07/28/2018   Allergen Reaction Noted   ??? Xopenex [levalbuterol hcl] Other (See Comments) 10/27/2013   ??? Allopurinol analogues  10/27/2013   ??? Versed [midazolam]  10/27/2013     Family History   Adopted: Yes     Social History     Tobacco Use   ??? Smoking status: Never Smoker   ??? Smokeless tobacco: Never Used   Substance Use Topics   ??? Alcohol use: Yes     Alcohol/week: 2.0 standard drinks     Types: 2 Cans of beer per week     Comment: on weekends sometimes        Objective:      Physical Exam:  Vitals:    08/16/18 0323 08/16/18 0523 08/16/18 0850 08/16/18 1110   BP: 144/79   144/79   Pulse: 127   127   Resp: 22  20 20    Temp: 39.3 ??C (102.7 ??F) 38.3 ??C (100.9 ??F) 36.8 ??C (98.2 ??F) 36.8 ??C (98.2 ??F)   TempSrc: Oral Oral Oral    SpO2: 98% 96%  96%   Weight:       Height:         General: Alert, well-appearing, and in no distress.  Eyes: Anicteric sclera, conjunctiva clear.  ENT:  Mucous membranes moist and intact.  Lymph: No cervical or supraclavicular adenopathy.  Lungs: Normal excursion, no dullness to percussion. Good air movement bilaterally, without wheezes or crackles. Normal upper airway sounds without evidence of stridor.  Cardiovascular: Regular rate and rhythm, S1, S2 normal, no murmur, click, rub or gallop appreciated.  Abdomen: Soft, non-tender, not distended, bowel sounds are normal, liver is not enlarged, spleen is not enlarged  Musculoskeletal: No clubbing and no synovitis.  Skin: No rashes or lesions.  Neuro: No focal neurological deficits.    Malnutrition Assessment by RD:          Diagnostic Review:   All labs and images were personally reviewed.

## 2018-08-16 NOTE — Unmapped (Signed)
Vancomycin Therapeutic Monitoring Pharmacy Note    Cameron Baker is a 27 y.o. male starting vancomycin. Date of therapy initiation: 08/15/18    Indication: Pneumonia    Prior Dosing Information: Gave a loading dose of 2000 mg at 2020 tonight     Goals:  Therapeutic Drug Levels  Vancomycin trough goal: 15-20 mg/L    Additional Clinical Monitoring/Outcomes  Renal function, volume status (intake and output)    Results: Not applicable    Wt Readings from Last 1 Encounters:   08/06/18 95.7 kg (211 lb)     Creatinine   Date Value Ref Range Status   08/15/2018 0.81 0.70 - 1.30 mg/dL Final   57/84/6962 9.52 0.70 - 1.30 mg/dL Final   84/13/2440 1.02 0.70 - 1.30 mg/dL Final        Pharmacokinetic Considerations and Significant Drug Interactions:  ? Adult (estimated initial): Vd = 67.947 L, ke = 0.1222 hr-1  ? Concurrent nephrotoxic meds: prn ibuprofen    Assessment/Plan:  Recommendation(s)  ? Start 1500 mg IV q 8 hrs  ? Estimated trough on recommended regimen: 15 mg/L    Follow-up  ? Level due: prior to fourth or fifth dose.   ? A pharmacist will continue to monitor and order levels as appropriate    Please page service pharmacist with questions/clarifications.    Torryn Hudspeth T Keajah Killough, RPh

## 2018-08-16 NOTE — Unmapped (Signed)
Hospitalist Daily Progress Note     LOS: 0 days under inpatient status    Assessment/Plan:  Principal Problem:    Social problem  Active Problems:    Asthma    Slow transit constipation    Dysphagia    Esophageal reflux    Hypothyroidism    Class 1 obesity in adult    Seizures (CMS-HCC)    Post traumatic stress disorder (PTSD)    Chronic tracheobronchitis (CMS-HCC)    Tracheostomy dependence (CMS-HCC)    VATER syndrome    Muscle spasms of both lower extremities    Enthesopathy of right hip region    Acne vulgaris    PEG (percutaneous endoscopic gastrostomy) status (CMS-HCC)    History of home ventilator (CMS-HCC)    Bipolar 1 disorder (CMS-HCC)    Esophageal achalasia    Cellulitis of left hand    Central sleep apnea  Resolved Problems:    * No resolved hospital problems. *         1. VATER syndrome w associated tracheobronchomalacia, trach dependence.  Stable.????Now w??rhinovirus??acquired in hospital; resp culture neg.????Known achalasia, esophageal stenosis. ??S/P EGD w dilation of 2 stenoses 8/30.   -Continue home arfomoterol, budesonide, TOBI nebs, 3% saline nebs.   -Continue azithro MWF.   -Continue Trilogy home vent qhs.??  -Airway clearance w Metaneb.  -Continue PPI.??  -He has now developed fever, worsening cough.  Sputum culture was acceptable for evaluation and is pending.  Pulmonology consult recommendations appreciated, will DC levofloxacin and vancomycin, transition to Zosyn.  Review of respiratory cultures show history of Haemophilus influenza and E. coli but no history of MRSA.  Follow-up cultures, titrate antibiotics as indicated.  He does have a positive viral panel for rhinovirus/enterovirus from 9/3 but this appeared to be clinically resolving.  *Bipolar.????Stable.  -Continue citalopram, mirtazapine, valproate syrup.    ??  2. Hypothyroid.????Stable.  -Continue levothyroxine.   ??  3. Acne.   -Continue home minocycline.   ??  4. L hand cellulitis. ??Resolved. ??(Sulfa-TMP 8/16-8/23)   ??  *DVT prophylaxis. -Ambulation.   ??  *Dispo. ??Stepdown status due to qhs vent requirement.????Now approved for Medicaid w funding secured for private-duty nursing at night as usual for him; awaiting approval of funding for Trilogy home vent. ??SW helping.????  ??  ??  Please page the??[place] [facility]) pager at [phone] with questions.  ??  ??  ??  Consultants:??  1.????GI (off)    Pending labs:    Order Current Status    Blood Culture, Adult In process    Lower Respiratory Culture Preliminary result            Subjective:   Complains of nausea, did have one episode of emesis this morning.    Objective:   Physical Exam:  General:  Tired, lying on his side, acutely ill-appearing   Eyes: Anicteric sclerae, no conjunctival injection.     ENT: Mucous membranes moist.     Neck: Trach w PM valve in place.   Respiratory: Normal respiratory effort.  Bilateral expiratory rhonchi with some scattered wheezing.   Cardiovascular: Regular rhythm w/o murmur.  No lower extremity edema.   Gastrointestinal: Bowel sounds present, soft, nontender, nondistended.    Skin: No acute rashes or breakdown seen.  Warm, dry.   Musculoskeletal: Motor strength in all extremities grossly intact.   Psychiatric: Alert.  Answers questions appropriately.  Normal conversation.   Neurologic: Extraocular movements intact, no facial asymmetry.  No gross sensory deficits.  Vital signs in last 24 hours:  Temp:  [36.7 ??C (98.1 ??F)-39.5 ??C (103.1 ??F)] 36.8 ??C (98.2 ??F)  Heart Rate:  [90-127] 127  SpO2 Pulse:  [127] 127  Resp:  [14-22] 20  BP: (120-144)/(58-79) 144/79  MAP (mmHg):  [80-95] 95  FiO2 (%):  [28 %-35 %] 28 %  SpO2:  [94 %-98 %] 96 %    Intake/Output last 24 hours:    Intake/Output Summary (Last 24 hours) at 08/16/2018 1452  Last data filed at 08/16/2018 1100  Gross per 24 hour   Intake 460 ml   Output 900 ml   Net -440 ml       Most recent recorded weights:  Last 5 Recorded Weights    07/29/18 0420 08/06/18 1236   Weight: 96.1 kg (211 lb 12.8 oz) 95.7 kg (211 lb)       Medications:   Scheduled Meds:  ??? albuterol  2.5 mg Nebulization 4x Daily (RT)   ??? arformoterol  15 mcg Nebulization BID   ??? azithromycin  500 mg G-tube Q MWF   ??? budesonide  1 mg Nebulization BID   ??? cetirizine  10 mg G-tube QPM   ??? cholecalciferol (vitamin D3)  5,000 Units Oral Daily   ??? citalopram  40 mg G-tube Nightly   ??? clindamycin   Topical BID   ??? esomeprazole  40 mg G-tube Daily   ??? fluticasone propionate  2 spray Each Nare Daily   ??? gabapentin  300 mg G-tube TID   ??? ipratropium  500 mcg Nebulization 4x Daily (RT)   ??? levothyroxine  75 mcg G-tube Daily   ??? minocycline  100 mg G-tube Nightly   ??? mirtazapine  15 mg G-tube Nightly   ??? montelukast  10 mg Oral Nightly   ??? mupirocin   Topical TID   ??? piperacillin-tazobactam (ZOSYN) IV (intermittent)  3.375 g Intravenous Q8H   ??? sodium chloride  4 mL Nebulization 4x Daily (RT)   ??? tobramycin (PF)  300 mg Nebulization BID (RT)   ??? triamcinolone   Topical BID   ??? valproate  250 mg G-tube Q AM   ??? valproate  750 mg Oral Nightly   35 minutes, over 50% spent in counseling and coordination of care.  Tawni Levy MD

## 2018-08-17 NOTE — Unmapped (Signed)
Hospitalist Daily Progress Note     LOS: 0 days under inpatient status    Assessment/Plan:  Principal Problem:    Social problem  Active Problems:    Asthma    Slow transit constipation    Dysphagia    Esophageal reflux    Hypothyroidism    Class 1 obesity in adult    Seizures (CMS-HCC)    Post traumatic stress disorder (PTSD)    Chronic tracheobronchitis (CMS-HCC)    Tracheostomy dependence (CMS-HCC)    VATER syndrome    Muscle spasms of both lower extremities    Enthesopathy of right hip region    Acne vulgaris    PEG (percutaneous endoscopic gastrostomy) status (CMS-HCC)    History of home ventilator (CMS-HCC)    Bipolar 1 disorder (CMS-HCC)    Esophageal achalasia    Cellulitis of left hand    Central sleep apnea  Resolved Problems:    * No resolved hospital problems. *         1. VATER syndrome w associated tracheobronchomalacia, trach dependence.  Stable.????Now w??rhinovirus??acquired in hospital; resp culture neg.????Known achalasia, esophageal stenosis. ??S/P EGD w dilation of 2 stenoses 8/30.   -Continue home arfomoterol, budesonide, TOBI nebs, 3% saline nebs.   -Continue azithro MWF.   -Continue Trilogy home vent qhs.??  -Airway clearance w Metaneb.  -Continue PPI.??  -He had now developed fever, worsening cough but symptoms are somewhat improved and he overall appears a bit better.  Sputum culture has now grown stenotrophomonas.  He has been on minocycline chronically.  Will discontinue Zosyn and start trimethoprim/sulfa every 12 hours.    *Bipolar.????Stable.  -Continue citalopram, mirtazapine, valproate syrup.    ??  2. Hypothyroid.????Stable.  -Continue levothyroxine.   ??  3. Acne.   -Continue home minocycline.   ??  4. L hand cellulitis. ??Resolved. ??(Sulfa-TMP 8/16-8/23)   ??  *DVT prophylaxis.   -Ambulation.   ??  *Dispo. ??Stepdown status due to qhs vent requirement.????Now approved for Medicaid w funding secured for private-duty nursing at night as usual for him; awaiting approval of funding for Trilogy home vent. ??SW helping.????  ????  Please page the??[place] [facility]) pager at [phone] with questions.  ??  Consultants:??  1.????GI (off)    Pending labs:    Order Current Status    Blood Culture, Adult Preliminary result    Lower Respiratory Culture Preliminary result            Subjective:   Feeling a bit better.  No complaint of nausea or vomiting.  No complaint of fevers or chills.    Objective:   Physical Exam:  General:  Tired, lying on his side, acutely ill-appearing   Eyes: Anicteric sclerae, no conjunctival injection.     ENT: Mucous membranes moist.     Neck: Trach w PM valve in place.   Respiratory: Normal respiratory effort.  Bilateral expiratory rhonchi with some scattered wheezing slight improvement from yesterday.   Cardiovascular: Regular rhythm w/o murmur.  No lower extremity edema.   Gastrointestinal: Bowel sounds present, soft, nontender, nondistended.    Skin: No acute rashes or breakdown seen.  Warm, dry.   Musculoskeletal: Motor strength in all extremities grossly intact.   Psychiatric: Alert.  Answers questions appropriately.  Normal conversation.   Neurologic: Extraocular movements intact, no facial asymmetry.  No gross sensory deficits.         Vital signs in last 24 hours:  Temp:  [36.1 ??C (97 ??F)-37.7 ??C (99.9 ??F)]  37.7 ??C (99.9 ??F)  Heart Rate:  [94-120] 109  SpO2 Pulse:  [100-109] 109  Resp:  [16-20] 20  BP: (100-145)/(55-100) 140/85  MAP (mmHg):  [65-113] 99  FiO2 (%):  [28 %] 28 %  SpO2:  [96 %-100 %] 97 %    Intake/Output last 24 hours:    Intake/Output Summary (Last 24 hours) at 08/17/2018 1518  Last data filed at 08/17/2018 1414  Gross per 24 hour   Intake 3190 ml   Output 3100 ml   Net 90 ml       Most recent recorded weights:  Last 5 Recorded Weights    07/29/18 0420 08/06/18 1236   Weight: 96.1 kg (211 lb 12.8 oz) 95.7 kg (211 lb)       Medications:   Scheduled Meds:  ??? albuterol  2.5 mg Nebulization 4x Daily (RT)   ??? arformoterol  15 mcg Nebulization BID   ??? azithromycin  500 mg G-tube Q MWF   ??? budesonide  1 mg Nebulization BID   ??? cetirizine  10 mg G-tube QPM   ??? cholecalciferol (vitamin D3)  5,000 Units Oral Daily   ??? citalopram  40 mg G-tube Nightly   ??? clindamycin   Topical BID   ??? esomeprazole  40 mg G-tube Daily   ??? fluticasone propionate  2 spray Each Nare Daily   ??? gabapentin  300 mg G-tube TID   ??? ipratropium  500 mcg Nebulization 4x Daily (RT)   ??? levothyroxine  75 mcg G-tube Daily   ??? minocycline  100 mg G-tube Nightly   ??? mirtazapine  15 mg G-tube Nightly   ??? montelukast  10 mg Oral Nightly   ??? mupirocin   Topical TID   ??? piperacillin-tazobactam (ZOSYN) IV (intermittent)  3.375 g Intravenous Q8H   ??? sodium chloride  4 mL Nebulization 4x Daily (RT)   ??? tobramycin (PF)  300 mg Nebulization BID (RT)   ??? triamcinolone   Topical BID   ??? valproate  250 mg G-tube Q AM   ??? valproate  750 mg Oral Nightly   35 minutes, over 50% spent in counseling and coordination of care.  Tawni Levy MD

## 2018-08-17 NOTE — Unmapped (Signed)
VSS, pt continues to receive IV antibiotics as scheduled, otherwise pt is independent with care.  Denies pain, c/o nausea this am and was given phenergan IV with adequate relief.  Will continue to monitor.  Problem: Adult Inpatient Plan of Care  Goal: Plan of Care Review  Outcome: Progressing  Goal: Patient-Specific Goal (Individualization)  Outcome: Progressing  Goal: Absence of Hospital-Acquired Illness or Injury  Outcome: Progressing  Goal: Optimal Comfort and Wellbeing  Outcome: Progressing  Goal: Readiness for Transition of Care  Outcome: Progressing  Goal: Rounds/Family Conference  Outcome: Progressing     Problem: Asthma Comorbidity  Goal: Maintenance of Asthma Control  Outcome: Progressing     Problem: Obstructive Sleep Apnea Risk or Actual (Comorbidity Management)  Goal: Unobstructed Breathing During Sleep  Outcome: Progressing     Problem: Skin and Tissue Injury (Mechanical Ventilation, Invasive)  Goal: Absence of Device-Related Skin and Tissue Injury  Outcome: Progressing     Problem: Ventilator-Induced Lung Injury (Mechanical Ventilation, Invasive)  Goal: Absence of Ventilator-Induced Lung Injury  Outcome: Progressing

## 2018-08-17 NOTE — Unmapped (Signed)
Patient in no acute distress, Patient has Smog enema now at 1800, states feeling better. Patient VVS, afebrile, no issues with pain today. Changed dressing and needle on port today, room was clean today. Patient took a shower today. Will continue to moniroe.    Problem: Adult Inpatient Plan of Care  Goal: Plan of Care Review  Outcome: Ongoing - Unchanged  Goal: Patient-Specific Goal (Individualization)  Outcome: Ongoing - Unchanged  Goal: Absence of Hospital-Acquired Illness or Injury  Outcome: Ongoing - Unchanged  Goal: Optimal Comfort and Wellbeing  Outcome: Ongoing - Unchanged  Goal: Readiness for Transition of Care  Outcome: Ongoing - Unchanged  Goal: Rounds/Family Conference  Outcome: Ongoing - Unchanged     Problem: Asthma Comorbidity  Goal: Maintenance of Asthma Control  Outcome: Ongoing - Unchanged     Problem: Obstructive Sleep Apnea Risk or Actual (Comorbidity Management)  Goal: Unobstructed Breathing During Sleep  Outcome: Ongoing - Unchanged     Problem: Skin and Tissue Injury (Mechanical Ventilation, Invasive)  Goal: Absence of Device-Related Skin and Tissue Injury  Outcome: Ongoing - Unchanged     Problem: Ventilator-Induced Lung Injury (Mechanical Ventilation, Invasive)  Goal: Absence of Ventilator-Induced Lung Injury  Outcome: Ongoing - Unchanged

## 2018-08-18 LAB — COMPREHENSIVE METABOLIC PANEL
ALBUMIN: 3.7 g/dL (ref 3.5–5.0)
ALKALINE PHOSPHATASE: 84 U/L (ref 38–126)
ALT (SGPT): 79 U/L — ABNORMAL HIGH (ref 19–72)
ANION GAP: 11 mmol/L (ref 9–15)
AST (SGOT): 40 U/L (ref 19–55)
BILIRUBIN TOTAL: 0.6 mg/dL (ref 0.0–1.2)
BLOOD UREA NITROGEN: 10 mg/dL (ref 7–21)
BUN / CREAT RATIO: 11
CALCIUM: 9.3 mg/dL (ref 8.5–10.2)
CO2: 28 mmol/L (ref 22.0–30.0)
EGFR CKD-EPI AA MALE: >90 mL/min/{1.73_m2} (ref >=60)
EGFR CKD-EPI NON-AA MALE: >90 mL/min/{1.73_m2} (ref >=60)
GLUCOSE RANDOM: 160 mg/dL (ref 65–179)
POTASSIUM: 4.3 mmol/L (ref 3.5–5.0)
PROTEIN TOTAL: 6.5 g/dL (ref 6.5–8.3)
SODIUM: 137 mmol/L (ref 135–145)

## 2018-08-18 LAB — CBC W/ AUTO DIFF
BASOPHILS ABSOLUTE COUNT: 0 10*9/L (ref 0.0–0.1)
BASOPHILS RELATIVE PERCENT: 0.3 %
EOSINOPHILS ABSOLUTE COUNT: 0.2 10*9/L (ref 0.0–0.4)
HEMATOCRIT: 33.2 % — ABNORMAL LOW (ref 41.0–53.0)
HEMOGLOBIN: 11 g/dL — ABNORMAL LOW (ref 13.5–17.5)
LARGE UNSTAINED CELLS: 2 % (ref 0–4)
LYMPHOCYTES ABSOLUTE COUNT: 0.7 10*9/L — ABNORMAL LOW (ref 1.5–5.0)
LYMPHOCYTES RELATIVE PERCENT: 4.7 %
MEAN CORPUSCULAR HEMOGLOBIN CONC: 33.3 g/dL (ref 31.0–37.0)
MEAN CORPUSCULAR HEMOGLOBIN: 24.5 pg — ABNORMAL LOW (ref 26.0–34.0)
MEAN CORPUSCULAR VOLUME: 73.7 fL — ABNORMAL LOW (ref 80.0–100.0)
MONOCYTES ABSOLUTE COUNT: 0.9 10*9/L — ABNORMAL HIGH (ref 0.2–0.8)
MONOCYTES RELATIVE PERCENT: 6.1 %
NEUTROPHILS ABSOLUTE COUNT: 12.2 10*9/L — ABNORMAL HIGH (ref 2.0–7.5)
NEUTROPHILS RELATIVE PERCENT: 85.4 %
PLATELET COUNT: 225 10*9/L (ref 150–440)
RED BLOOD CELL COUNT: 4.5 10*12/L (ref 4.50–5.90)
RED CELL DISTRIBUTION WIDTH: 17.8 % — ABNORMAL HIGH (ref 12.0–15.0)

## 2018-08-18 LAB — BILIRUBIN TOTAL: Bilirubin:MCnc:Pt:Ser/Plas:Qn:: 0.6

## 2018-08-18 LAB — HEMATOCRIT: Lab: 33.2 — ABNORMAL LOW

## 2018-08-18 NOTE — Unmapped (Signed)
Hospitalist Daily Progress Note     LOS: 0 days under inpatient status    Assessment/Plan:  Principal Problem:    Social problem  Active Problems:    Asthma    Slow transit constipation    Dysphagia    Esophageal reflux    Hypothyroidism    Class 1 obesity in adult    Seizures (CMS-HCC)    Post traumatic stress disorder (PTSD)    Chronic tracheobronchitis (CMS-HCC)    Tracheostomy dependence (CMS-HCC)    VATER syndrome    Muscle spasms of both lower extremities    Enthesopathy of right hip region    Acne vulgaris    PEG (percutaneous endoscopic gastrostomy) status (CMS-HCC)    History of home ventilator (CMS-HCC)    Bipolar 1 disorder (CMS-HCC)    Esophageal achalasia    Cellulitis of left hand    Central sleep apnea  Resolved Problems:    * No resolved hospital problems. *         1. VATER syndrome w associated tracheobronchomalacia, trach dependence.  Stable.????Now w??rhinovirus??acquired in hospital; resp culture neg.????Known achalasia, esophageal stenosis. ??S/P EGD w dilation of 2 stenoses 8/30.   -Continue home arfomoterol, budesonide, TOBI nebs, 3% saline nebs.   -Continue azithro MWF.   -Continue Trilogy home vent qhs.??  -Airway clearance w Metaneb.  -Continue PPI.??  -He had now developed fever, worsening cough but symptoms are somewhat improved and he overall appears a bit better.  Sputum culture has now grown stenotrophomonas.  He has been on minocycline chronically.  Continue trimethoprim/sulfa every 12 hours started on 9/10 to complete a 5-day course or as clinically indicated.    *Bipolar disorder.????Stable.  -Continue citalopram, mirtazapine, valproate syrup.    2. Hypothyroid.????Stable.  -Continue levothyroxine.   ??  3. Acne.   -Continue home minocycline.   ??  4. L hand cellulitis. ??Resolved. ??(Sulfa-TMP 8/16-8/23)   ??  *DVT prophylaxis.   -Ambulation.   ??  *Dispo. ??Stepdown status due to qhs vent requirement.????Now approved for Medicaid w funding secured for private-duty nursing at night as usual for him; awaiting approval of funding for Trilogy home vent. ??SW helping.????  ????  Please page the??[place] [facility]) pager at [phone] with questions.  ??  Consultants:??  1.????GI (off)    Pending labs:    Order Current Status    Blood Culture, Adult Preliminary result    Lower Respiratory Culture Preliminary result            Subjective:     No complaint of nausea or vomiting.  No complaint of fevers or chills.  His cough is a bit better.    Objective:   Physical Exam:  General:  More alert and comfortable appearing today.  Pleasant, conversant.   Eyes: Anicteric sclerae, no conjunctival injection.     ENT: Mucous membranes moist.     Neck: Trach with a respiratory therapy attachment in place currently receiving nebulized albuterol.   Respiratory: Normal respiratory effort.  Bilateral expiratory rhonchi with some scattered wheezing with continued improvement from yesterday.  Right lung sounds much clearer.   Cardiovascular: Regular rhythm w/o murmur.  No lower extremity edema.   Gastrointestinal: Bowel sounds present, soft, nontender, nondistended.    Skin: No acute rashes or breakdown seen.  Warm, dry.   Musculoskeletal: Motor strength in all extremities grossly intact.   Psychiatric: Alert.  Answers questions appropriately.  Normal conversation.   Neurologic: Extraocular movements intact, no facial asymmetry.  No gross  sensory deficits.         Vital signs in last 24 hours:  Temp:  [36.8 ??C (98.2 ??F)-39 ??C (102.2 ??F)] 37.2 ??C (99 ??F)  Heart Rate:  [103-128] 103  Resp:  [18-24] 18  BP: (119-159)/(54-102) 156/102  MAP (mmHg):  [75-115] 115  FiO2 (%):  [28 %] 28 %  SpO2:  [93 %-100 %] 98 %    Intake/Output last 24 hours:    Intake/Output Summary (Last 24 hours) at 08/18/2018 1101  Last data filed at 08/18/2018 0800  Gross per 24 hour   Intake 740 ml   Output 3125 ml   Net -2385 ml       Most recent recorded weights:  Last 5 Recorded Weights    07/29/18 0420 08/06/18 1236   Weight: 96.1 kg (211 lb 12.8 oz) 95.7 kg (211 lb)       Medications:   Scheduled Meds:  ??? albuterol  2.5 mg Nebulization 4x Daily (RT)   ??? arformoterol  15 mcg Nebulization BID   ??? azithromycin  500 mg G-tube Q MWF   ??? budesonide  1 mg Nebulization BID   ??? cetirizine  10 mg G-tube QPM   ??? cholecalciferol (vitamin D3)  5,000 Units Oral Daily   ??? citalopram  40 mg G-tube Nightly   ??? clindamycin   Topical BID   ??? esomeprazole  40 mg G-tube Daily   ??? fluticasone propionate  2 spray Each Nare Daily   ??? gabapentin  300 mg G-tube TID   ??? ipratropium  500 mcg Nebulization 4x Daily (RT)   ??? levothyroxine  75 mcg G-tube Daily   ??? minocycline  100 mg G-tube Nightly   ??? mirtazapine  15 mg G-tube Nightly   ??? montelukast  10 mg Oral Nightly   ??? mupirocin   Topical TID   ??? piperacillin-tazobactam (ZOSYN) IV (intermittent)  3.375 g Intravenous Q8H   ??? sodium chloride  4 mL Nebulization 4x Daily (RT)   ??? sulfamethoxazole-trimethoprim  20 mL Oral Q12H SCH   ??? tobramycin (PF)  300 mg Nebulization BID (RT)   ??? triamcinolone   Topical BID   ??? valproate  250 mg G-tube Q AM   ??? valproate  750 mg Oral Nightly   35 minutes, over 50% spent in counseling and coordination of care.  Tawni Levy MD

## 2018-08-18 NOTE — Unmapped (Signed)
Patient received on trach mask and was given all scheduled meds as ordered.Patient wore trilogy for about 4 hour then requested to go trach mask.Patient trach is midline ans secure.

## 2018-08-18 NOTE — Unmapped (Signed)
Pt stable on trach collar.afebrile. Tolerating  diet.phenergan given effectively. Voiding independently. Skin grossly intact.denies pain.no family calls or visits thus far this shift.  Problem: Adult Inpatient Plan of Care  Goal: Plan of Care Review  Outcome: Progressing  Goal: Patient-Specific Goal (Individualization)  Outcome: Progressing  Goal: Absence of Hospital-Acquired Illness or Injury  Outcome: Progressing  Goal: Optimal Comfort and Wellbeing  Outcome: Progressing  Goal: Readiness for Transition of Care  Outcome: Progressing  Goal: Rounds/Family Conference  Outcome: Progressing     Problem: Asthma Comorbidity  Goal: Maintenance of Asthma Control  Outcome: Progressing     Problem: Obstructive Sleep Apnea Risk or Actual (Comorbidity Management)  Goal: Unobstructed Breathing During Sleep  Outcome: Progressing     Problem: Skin and Tissue Injury (Mechanical Ventilation, Invasive)  Goal: Absence of Device-Related Skin and Tissue Injury  Outcome: Progressing     Problem: Ventilator-Induced Lung Injury (Mechanical Ventilation, Invasive)  Goal: Absence of Ventilator-Induced Lung Injury  Outcome: Progressing     Problem: Fall Injury Risk  Goal: Absence of Fall and Fall-Related Injury  Outcome: Progressing

## 2018-08-18 NOTE — Unmapped (Signed)
Neuro: AOx4, but drowsy throughout night. CAM-ICU -. PERRL. See MAR for prn's given.   CV: SD status. HR has steadily climbed to 120-130's with increased temp and N/V. BP 130-150's. Started temp of 38.8, paged on call MD and tylenol order was modified to cover fever. Tylenol given at 411, temp reassessed around 515 and was found to be >39 Aux. New page sent to on call MD to inform of new TMax and sustained HR of >120. Waiting on response now.   Resp: HTC during the day and triology vent overnight. Increased mucous production and persistent cough. No desaturations noted.   GI/GU: UOP adequate, >1063ml out. Enema given overnight, x1 BM. Regular diet. Micky button used for all meds.   Lines: R chest port w/ NS KVO.   Wounds: L hand, prior wound. Reddened area around [place] button.   Pt safe/free from falls.     Problem: Adult Inpatient Plan of Care  Goal: Plan of Care Review  Outcome: Progressing  Goal: Patient-Specific Goal (Individualization)  Outcome: Progressing  Goal: Absence of Hospital-Acquired Illness or Injury  Outcome: Progressing  Goal: Optimal Comfort and Wellbeing  Outcome: Progressing  Goal: Readiness for Transition of Care  Outcome: Progressing  Goal: Rounds/Family Conference  Outcome: Progressing     Problem: Asthma Comorbidity  Goal: Maintenance of Asthma Control  Outcome: Progressing     Problem: Obstructive Sleep Apnea Risk or Actual (Comorbidity Management)  Goal: Unobstructed Breathing During Sleep  Outcome: Progressing     Problem: Skin and Tissue Injury (Mechanical Ventilation, Invasive)  Goal: Absence of Device-Related Skin and Tissue Injury  Outcome: Progressing     Problem: Ventilator-Induced Lung Injury (Mechanical Ventilation, Invasive)  Goal: Absence of Ventilator-Induced Lung Injury  Outcome: Progressing     Problem: Fall Injury Risk  Goal: Absence of Fall and Fall-Related Injury  Outcome: Progressing

## 2018-08-19 DIAGNOSIS — R0600 Dyspnea, unspecified: Principal | ICD-10-CM

## 2018-08-19 LAB — COMPREHENSIVE METABOLIC PANEL
ALBUMIN: 4 g/dL (ref 3.5–5.0)
ALKALINE PHOSPHATASE: 83 U/L (ref 38–126)
ALT (SGPT): 57 U/L (ref 19–72)
ANION GAP: 11 mmol/L (ref 9–15)
AST (SGOT): 43 U/L (ref 19–55)
BLOOD UREA NITROGEN: 9 mg/dL (ref 7–21)
BUN / CREAT RATIO: 9
CALCIUM: 8.9 mg/dL (ref 8.5–10.2)
CO2: 28 mmol/L (ref 22.0–30.0)
CREATININE: 1.01 mg/dL (ref 0.70–1.30)
EGFR CKD-EPI AA MALE: >90 mL/min/{1.73_m2} (ref >=60)
EGFR CKD-EPI NON-AA MALE: >90 mL/min/{1.73_m2} (ref >=60)
GLUCOSE RANDOM: 110 mg/dL (ref 65–179)
POTASSIUM: 4.1 mmol/L (ref 3.5–5.0)
PROTEIN TOTAL: 7.2 g/dL (ref 6.5–8.3)
SODIUM: 136 mmol/L (ref 135–145)

## 2018-08-19 LAB — CBC W/ AUTO DIFF
BASOPHILS ABSOLUTE COUNT: 0.1 10*9/L (ref 0.0–0.1)
BASOPHILS ABSOLUTE COUNT: 0.1 10*9/L (ref 0.0–0.1)
BASOPHILS RELATIVE PERCENT: 0.7 %
EOSINOPHILS ABSOLUTE COUNT: 0.2 10*9/L (ref 0.0–0.4)
EOSINOPHILS RELATIVE PERCENT: 2.2 %
HEMATOCRIT: 32.9 % — ABNORMAL LOW (ref 41.0–53.0)
HEMATOCRIT: 34 % — ABNORMAL LOW (ref 41.0–53.0)
HEMOGLOBIN: 10.6 g/dL — ABNORMAL LOW (ref 13.5–17.5)
HEMOGLOBIN: 11 g/dL — ABNORMAL LOW (ref 13.5–17.5)
LARGE UNSTAINED CELLS: 3 % (ref 0–4)
LARGE UNSTAINED CELLS: 3 % (ref 0–4)
LYMPHOCYTES ABSOLUTE COUNT: 1.4 10*9/L — ABNORMAL LOW (ref 1.5–5.0)
LYMPHOCYTES ABSOLUTE COUNT: 2 10*9/L (ref 1.5–5.0)
LYMPHOCYTES RELATIVE PERCENT: 14.2 %
MEAN CORPUSCULAR HEMOGLOBIN CONC: 32.1 g/dL (ref 31.0–37.0)
MEAN CORPUSCULAR HEMOGLOBIN CONC: 32.3 g/dL (ref 31.0–37.0)
MEAN CORPUSCULAR HEMOGLOBIN: 23.9 pg — ABNORMAL LOW (ref 26.0–34.0)
MEAN CORPUSCULAR HEMOGLOBIN: 24.1 pg — ABNORMAL LOW (ref 26.0–34.0)
MEAN CORPUSCULAR VOLUME: 74.4 fL — ABNORMAL LOW (ref 80.0–100.0)
MEAN CORPUSCULAR VOLUME: 74.5 fL — ABNORMAL LOW (ref 80.0–100.0)
MEAN PLATELET VOLUME: 7.1 fL (ref 7.0–10.0)
MEAN PLATELET VOLUME: 6.9 fL — ABNORMAL LOW (ref 7.0–10.0)
MONOCYTES ABSOLUTE COUNT: 0.9 10*9/L — ABNORMAL HIGH (ref 0.2–0.8)
MONOCYTES ABSOLUTE COUNT: 1.2 10*9/L — ABNORMAL HIGH (ref 0.2–0.8)
MONOCYTES RELATIVE PERCENT: 8 %
NEUTROPHILS ABSOLUTE COUNT: 9.4 10*9/L — ABNORMAL HIGH (ref 2.0–7.5)
NEUTROPHILS RELATIVE PERCENT: 75.2 %
NEUTROPHILS RELATIVE PERCENT: 72.4 %
PLATELET COUNT: 268 10*9/L (ref 150–440)
PLATELET COUNT: 325 10*9/L (ref 150–440)
RED BLOOD CELL COUNT: 4.57 10*12/L (ref 4.50–5.90)
RED CELL DISTRIBUTION WIDTH: 17.7 % — ABNORMAL HIGH (ref 12.0–15.0)
RED CELL DISTRIBUTION WIDTH: 17.8 % — ABNORMAL HIGH (ref 12.0–15.0)
WBC ADJUSTED: 12.5 10*9/L — ABNORMAL HIGH (ref 4.5–11.0)

## 2018-08-19 LAB — LACTATE BLOOD VENOUS: Lactate:SCnc:Pt:BldV:Qn:: 1.1

## 2018-08-19 LAB — EGFR CKD-EPI NON-AA MALE: Lab: >90

## 2018-08-19 LAB — SMEAR REVIEW: Lab: 0

## 2018-08-19 LAB — BLOOD GAS, VENOUS
BASE EXCESS VENOUS: 4.1 — ABNORMAL HIGH (ref -2.0–2.0)
HCO3 VENOUS: 28 mmol/L — ABNORMAL HIGH (ref 22–27)
O2 SATURATION VENOUS: 59.5 % (ref 40.0–85.0)
PCO2 VENOUS: 43 mmHg (ref 40–60)
PO2 VENOUS: 34 mmHg (ref 30–55)

## 2018-08-19 LAB — PLATELET COUNT: Lab: 325

## 2018-08-19 LAB — BASOPHILS ABSOLUTE COUNT: Lab: 0.1

## 2018-08-19 LAB — FIO2 VENOUS

## 2018-08-19 MED ORDER — SULFAMETHOXAZOLE 200 MG-TRIMETHOPRIM 40 MG/5 ML ORAL SUSPENSION
Freq: Two times a day (BID) | ORAL | 0 refills | 0.00000 days | Status: CP
Start: 2018-08-19 — End: 2018-08-24

## 2018-08-19 NOTE — Unmapped (Signed)
Pt requested to not receive airway clarence this shift

## 2018-08-19 NOTE — Unmapped (Signed)
Pt stable, no acute events overnight.   Neuro: AOx4. CAM-ICU -. PERRL. See MAR for prn's given.   CV: SD status. HR 80-100's. BP 120-150's. Febrile to a TMax of 38.7, given Tylenol, fever reduced and now Afebrile.  Resp: HTC during the day and triology vent overnight. Increased mucous production and persistent cough. No desaturations noted.   GI/GU: x1 UOP and x1 BM. Regular diet. Micky button used for all meds.   Lines: R chest port, flushed, and no complications noted.  Wounds: L hand, prior wound. Reddened area around [place] button. Pt bathed during day shift.   Pt safe/free from falls.     Problem: Adult Inpatient Plan of Care  Goal: Plan of Care Review  Outcome: Progressing  Goal: Patient-Specific Goal (Individualization)  Outcome: Progressing  Goal: Absence of Hospital-Acquired Illness or Injury  Outcome: Progressing  Goal: Optimal Comfort and Wellbeing  Outcome: Progressing  Goal: Readiness for Transition of Care  Outcome: Progressing  Goal: Rounds/Family Conference  Outcome: Progressing     Problem: Asthma Comorbidity  Goal: Maintenance of Asthma Control  Outcome: Progressing     Problem: Skin and Tissue Injury (Mechanical Ventilation, Invasive)  Goal: Absence of Device-Related Skin and Tissue Injury  Outcome: Progressing     Problem: Obstructive Sleep Apnea Risk or Actual (Comorbidity Management)  Goal: Unobstructed Breathing During Sleep  Outcome: Progressing     Problem: Ventilator-Induced Lung Injury (Mechanical Ventilation, Invasive)  Goal: Absence of Ventilator-Induced Lung Injury  Outcome: Progressing     Problem: Fall Injury Risk  Goal: Absence of Fall and Fall-Related Injury  Outcome: Progressing

## 2018-08-19 NOTE — Unmapped (Signed)
Patient received on room air then placed on vent at hour of sleep.Patient was given all scheduled meds as ordered with no complications  Cameron Baker is midline and secure with minimal secretions.

## 2018-08-19 NOTE — Unmapped (Signed)
Physician Discharge Summary    Admit date: 07/28/2018    Discharge date and time: 08/19/2018    Discharge to: Home    Discharge Service: Med Undesignated (MDX)    Discharge Attending Physician: Rica Koyanagi, MD    Discharge Diagnoses: VATER syndrome, Stenotrophomonas maltophilia tracheitis and bronchitis.    Pertinent Test Results: Stenotrophomonas maltophilia on respiratory culture showing sensitivity to trimethoprim sulfa, minocycline and levofloxacin.  Resistant to ceftazidime.    Hospital Course:  Mr. 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  On further interview at presentation, however, one of the main concerns was that his Medicaid had lapsed and this resulted in a gap in his at-home nursing care (required for his nightly ventilation).  Below is his relatively uneventful hospital course by problem:  ??  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  His Medicaid apparently lapsed and resulted in his inability to have necessary at-home nursing care at night for his Trilogy ventilator.  He was continued on his home arfomoterol, budesonide, TOBI nebs, 3% saline nebs, albuterol/ipratropium nebs, azithromycin MWF.  He was continued on his usual home Trilogy vent settings (AVAPS, VT 525 ml, PEEP 10 cm H2O).  By day of discharge, SW was able to secure a Trilogy vent unit for nightly home use and a nurse to be available nightly.   ??  *Type II achalasia, esophageal stenosis secondary to VATER syndrome.  His last EGD was in 05/2018 which showed esophageal stenosis (balloon dilation done at that time).  He complained of some mild dysphagia on 8/26 concerning for recurrent symptomatic stenosis.  GI medicine was consulted and performed an EGD on 8/30 showing two stenosis that were dilated to 20 mm.  His dysphagia resolved.  He will follow up with Dr. Wilford Corner of GI medicine.                   *Bipolar 1 disorder, PTSD, anxiety. ??He has followed with [facility] and undergone psychotherapy previously.?? He was continued on his home citalopram, valproate, mirtazapine, lorazepam 0.5 mg PO BID prn  ??  *Left hand cellulitis.  This resolved with a course of Bactrim (8/16-8/23).     3 days before discharge, he developed mild fever and worsening cough with rhonchorous breath sounds.  Sputum promptly grew stenotrophomonas as noted above.  He had been started empirically on Zosyn and was transitioned easily to trimethoprim/sulfa and had a very prompt response with an essential clearing of his lungs leaving him only with his very mild residual coarse breath sounds bilaterally, his baseline exam.  The patient now feels much better and says he is ready to go home.        Condition at Discharge: stable  Discharge Medications:      Your Medication List      STOP taking these medications    guaiFENesin 600 mg 12 hr tablet  Commonly known as:  MUCINEX     mupirocin 2 % ointment  Commonly known as:  BACTROBAN     ondansetron 8 MG disintegrating tablet  Commonly known as:  ZOFRAN-ODT     oxyCODONE 5 MG immediate release tablet  Commonly known as:  ROXICODONE  promethazine 6.25 mg/5 mL syrup  Commonly known as:  PHENERGAN     sulfamethoxazole-trimethoprim 800-160 mg per tablet  Commonly known as:  BACTRIM DS  Replaced by:  sulfamethoxazole-trimethoprim 200-40 mg/5 mL suspension        START taking these medications    sulfamethoxazole-trimethoprim 200-40 mg/5 mL suspension  Commonly known as:  BACTRIM,SEPTRA  Take 20 mL by mouth every twelve (12) hours. for 4 days  Replaces:  sulfamethoxazole-trimethoprim 800-160 mg per tablet        CONTINUE taking these medications    acetaminophen 325 MG tablet  Commonly known as:  TYLENOL  Take 1 tablet (325 mg total) by mouth every six (6) hours as needed for pain.     albuterol 90 mcg/actuation inhaler  Commonly known as:  PROVENTIL HFA;VENTOLIN HFA  Inhale 2 puffs every six (6) hours as needed for wheezing.     arformoterol 15 mcg/2 mL  Commonly known as:  BROVANA  Inhale 2 mL (15 mcg total) by nebulization Two (2) times a day.     azithromycin 500 MG tablet  Commonly known as:  ZITHROMAX  1 tablet (500 mg total) by G-tube route 3 (three) times a week.     budesonide 0.5 mg/2 mL nebulizer solution  Commonly known as:  PULMICORT  Inhale 4 mL (1 mg total) by nebulization Two (2) times a day.     budesonide-formoterol 160-4.5 mcg/actuation inhaler  Commonly known as:  SYMBICORT  Inhale 2 puffs Two (2) times a day.     cetirizine 10 MG tablet  Commonly known as:  ZyrTEC  1 tablet (10 mg total) by G-tube route every evening.     cholecalciferol (vitamin D3) 2,000 unit Cap  3 capsules (6,000 Units total) by G-tube route daily.     citalopram 40 MG tablet  Commonly known as:  CeleXA  1 tablet (40 mg total) by G-tube route nightly.     clindamycin 1 % gel  Commonly known as:  CLINDAGEL  Apply topically Two (2) times a day.     cyclobenzaprine 5 MG tablet  Commonly known as:  FLEXERIL  Take 1 tablet (5 mg total) by mouth Three (3) times a day as needed for muscle spasms.     diclofenac sodium 1 % gel  Commonly known as:  VOLTAREN  Apply 2 g topically 4 (four) times a day as needed.     dicyclomine 10 mg capsule  Commonly known as:  BENTYL  10 mg by G-tube route 4 (four) times a day as needed.     fluticasone propionate 50 mcg/actuation nasal spray  Commonly known as:  FLONASE  2 sprays by Each Nare route daily.     gabapentin 300 MG capsule  Commonly known as:  NEURONTIN  1 capsule (300 mg total) by G-tube route Three (3) times a day.     ibuprofen 600 MG tablet  Commonly known as:  ADVIL,MOTRIN  1 tablet (600 mg total) by G-tube route every six (6) hours as needed for pain.     ipratropium-albuterol 0.5-2.5 mg/3 mL nebulizer  Commonly known as:  DUO-NEB  Inhale 3 mL 4 (four) times a day.     levothyroxine 75 MCG tablet  Commonly known as:  SYNTHROID, LEVOTHROID  1 tablet (75 mcg total) by G-tube route daily.     lidocaine-prilocaine 2.5-2.5 % cream  Commonly known as:  EMLA  APPLY TOPICALLY DAILY AS NEEDED FOR PORT ACCESS     linaCLOtide 290 mcg  capsule  Commonly known as:  LINZESS  1 capsule (290 mcg total) by G-tube route daily.     LORazepam 0.5 MG tablet  Commonly known as:  ATIVAN  Take 1 tablet (0.5mg ) twice daily and 1 tablet (0.5mg ) daily as needed for anxiety via G-tube     minocycline 100 MG capsule  Commonly known as:  MINOCIN,DYNACIN  1 capsule (100 mg total) by G-tube route nightly.     mirtazapine 15 MG tablet  Commonly known as:  REMERON  1 tablet (15 mg total) by G-tube route nightly.     montelukast 10 mg tablet  Commonly known as:  SINGULAIR  Take 1 tablet (10 mg total) by mouth nightly.     nebulizers Misc  Provide 1 device  for use with inhaled medication.     NORMAL SALINE FLUSH 0.9 % injection  Generic drug:  sodium chloride  INFUSE INTO VENOUS CATHETER 4 TIMES DAILY AS NEEDED (PORT FLUSH)     omeprazole 20 MG capsule  Commonly known as:  PriLOSEC  20 mg Two (2) times a day (30 minutes before a meal). Via G-tube     polyethylene glycol 17 gram packet  Commonly known as:  MIRALAX  17 g by G-tube route daily as needed.     sodium chloride 0.9 % Soln  Commonly known as:  NS  Infuse 10 mL into a venous catheter 4 (four) times a day as needed (port flush).     sodium chloride 3 % nebulizer solution  Inhale 4 mL by nebulization 4 (four) times a day.     tobramycin (PF) 300 mg/5 mL nebulizer solution  Commonly known as:  TOBI  INHALE (300MG ) VIA NEBULIZER EVERY TWELVE HOURS EVERY OTHER MONTH     triamcinolone 0.1 % cream  Commonly known as:  KENALOG  Apply topically Two (2) times a day.     valproate 250 mg/5 mL syrup Commonly known as:  DEPAKENE  Take 5ml (250mg ) by G-tube every morning and 15ml (750mg ) every evening            Pending Test Results:      Order Current Status    Blood Culture, Adult Preliminary result    Lower Respiratory Culture Preliminary result          Discharge Instructions:     Diet Instructions     Discharge diet (specify)      Discharge Nutrition Therapy:  General    Resume same diet as before being in the hospital.               Follow Up instructions and Outpatient Referrals     Discharge instructions      Please resume private duty nursing services on day of discharge    Ht: 65  Wt: 95.7 kg  Dx: VATER syndrome w associated tracheobronchomalacia, trach dependence             Appointments which have been scheduled for you    Aug 23, 2018  2:25 PM EDT  (Arrive by 2:05 PM)  RETURN CONTINUITY with [name], MD  [facility] FAMILY MEDICINE [facility] REGION) [address]  [place] [address]  [phone]      Aug 25, 2018  1:00 PM EDT  (Arrive by 12:45 PM)  RETURN  STEP with Rosezetta Schlatter, PMHNP  [facility] PSYCHIATRY STEP [facility] REGION) [address] [place] [address]  [phone]  Sep 14, 2018 10:30 AM EDT  (Arrive by 10:15 AM)  RETURN PFT 15 with [facility] PFT 4  [facility] PULMONARY SPECIALTY FUNCT [facility] REGION) [address]  [address]  [place] [address]  [phone]      Sep 14, 2018 11:00 AM EDT  (Arrive by 10:45 AM)  RETURN BRONCHIECTSIS with Truett Mainland, MD  [facility] PULMONARY SPECIALTY CL [facility] [facility] REGION) [address]  [address]  [place] [address]  [phone]              I spent greater than 30 minutes in the discharge of this patient.  Tawni Levy MD

## 2018-08-19 NOTE — Unmapped (Signed)
PT s/c home under self care. VSS upon D/C

## 2018-08-20 ENCOUNTER — Ambulatory Visit: Admit: 2018-08-20 | Discharge: 2018-08-24 | Disposition: A | Discharge status: Home Health | Payer: MEDICARE

## 2018-08-20 LAB — URINALYSIS
BILIRUBIN UA: NEGATIVE
GLUCOSE UA: NEGATIVE
KETONES UA: NEGATIVE
LEUKOCYTE ESTERASE UA: NEGATIVE
NITRITE UA: NEGATIVE
PH UA: 8 (ref 5.0–9.0)
PROTEIN UA: NEGATIVE
RBC UA: <1 /HPF (ref <=3)
SPECIFIC GRAVITY UA: 1.005 (ref 1.003–1.030)
SQUAMOUS EPITHELIAL: <1 /HPF (ref 0–5)
UROBILINOGEN UA: 0.2
WBC UA: <1 /HPF (ref <=2)

## 2018-08-20 LAB — BASIC METABOLIC PANEL
ANION GAP: 11 mmol/L (ref 9–15)
BUN / CREAT RATIO: 9
CHLORIDE: 103 mmol/L (ref 98–107)
CO2: 26 mmol/L (ref 22.0–30.0)
CREATININE: 0.99 mg/dL (ref 0.70–1.30)
EGFR CKD-EPI AA MALE: >90 mL/min/{1.73_m2} (ref >=60)
EGFR CKD-EPI NON-AA MALE: >90 mL/min/{1.73_m2} (ref >=60)
POTASSIUM: 4.5 mmol/L (ref 3.5–5.0)
SODIUM: 140 mmol/L (ref 135–145)

## 2018-08-20 LAB — CBC
HEMATOCRIT: 33.5 % — ABNORMAL LOW (ref 41.0–53.0)
HEMOGLOBIN: 10.6 g/dL — ABNORMAL LOW (ref 13.5–17.5)
MEAN CORPUSCULAR HEMOGLOBIN CONC: 31.6 g/dL (ref 31.0–37.0)
MEAN CORPUSCULAR HEMOGLOBIN: 23.9 pg — ABNORMAL LOW (ref 26.0–34.0)
MEAN CORPUSCULAR VOLUME: 75.5 fL — ABNORMAL LOW (ref 80.0–100.0)
MEAN PLATELET VOLUME: 7.2 fL (ref 7.0–10.0)
PLATELET COUNT: 293 10*9/L (ref 150–440)
RED CELL DISTRIBUTION WIDTH: 17.6 % — ABNORMAL HIGH (ref 12.0–15.0)

## 2018-08-20 LAB — POTASSIUM: Potassium:SCnc:Pt:Ser/Plas:Qn:: 4.5

## 2018-08-20 LAB — LACTATE BLOOD VENOUS: Lactate:SCnc:Pt:BldV:Qn:: 0.7

## 2018-08-20 LAB — RED CELL DISTRIBUTION WIDTH: Lab: 17.6 — ABNORMAL HIGH

## 2018-08-20 LAB — BILIRUBIN UA: Lab: NEGATIVE

## 2018-08-20 NOTE — Unmapped (Signed)
Trach patent, clean, secure and midline.  Patient on ventilator overnight on settings as documented.

## 2018-08-20 NOTE — Unmapped (Signed)
Patient bib EMS from home with reports of sob, rhonchi present for EMS. History of trach, on 15 L NRB @ 99%. Fever of 102, last tylenol x1 hour prior to arrival.

## 2018-08-20 NOTE — Unmapped (Signed)
Patient rounding complete, call bell in reach, bed locked and in lowest position, patient belongings at bedside and within reach of patient.  Patient updated on plan of care.
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
## 2018-08-20 NOTE — Unmapped (Signed)
Rn attempting to obtain Mickey tube connector from inpatient floors.

## 2018-08-20 NOTE — Unmapped (Signed)
Cameron Baker was discharged from [facility] today with ongoing treatment for Stenotrophomonas tracheitis vs bronchitis TMP-SMX and supportive care for Rhinovirus.    Received a call from his [facility] tonight reporting that although he feels well he is currently saturating 89% then down to 85% during the course of our conversation, tachycardic to 120 and febrile to 102F. He reports that he left his vent at a friend's house but should be receiving it tonight.     I have recommended that he be evaluated urgently in the ED. Per the [facility] he is currently refusing to go in. I have reiterated that it is my medical advice that he is evaluated in the ED for these vital sign findings.    Last fever during admission was the night of 9/11 to 38.2, no documentation of desaturations during recent hospitalization.

## 2018-08-20 NOTE — Unmapped (Signed)
Patient via EMS complaining of Fever 101.4 and SOB, patient discharged today from [facility] , d/c on Vanc

## 2018-08-20 NOTE — Unmapped (Signed)
Vancomycin Therapeutic Monitoring Pharmacy Note    Cameron Baker is a 28 y.o. male starting vancomycin. Date of therapy initiation: 08/19/18    Indication: Pneumonia    Prior Dosing Information: None/new initiation     Goals:  Therapeutic Drug Levels  Vancomycin trough goal: 15-20 mg/L    Additional Clinical Monitoring/Outcomes  Renal function, volume status (intake and output)    Results: Not applicable    Wt Readings from Last 1 Encounters:   08/20/18 (!) 103.9 kg (229 lb 0.9 oz)     Creatinine   Date Value Ref Range Status   08/20/2018 0.99 0.70 - 1.30 mg/dL Final   16/09/9603 5.40 0.70 - 1.30 mg/dL Final   98/10/9146 8.29 0.70 - 1.30 mg/dL Final        Pharmacokinetic Considerations and Significant Drug Interactions:  ? Adult (estimated initial): Vd = 74 L, ke = 0.10 hr-1  ? Concurrent nephrotoxic meds: cefepime    Assessment/Plan:  Recommendation(s)  ? 2000 mg load given in ED. start 1500 mg q8h  ? Estimated trough on recommended regimen: 18 mg/L, estimated peak 34 mg/L    Follow-up  ? Level due: prior to fourth or fifth dose.   ? A pharmacist will continue to monitor and order levels as appropriate    Please page service pharmacist with questions/clarifications.    [place] K Danniel Tones, PharmD

## 2018-08-20 NOTE — Unmapped (Signed)
Bed: 13-A  Expected date:   Expected time:   Means of arrival:   Comments:  Charge

## 2018-08-20 NOTE — Unmapped (Signed)
Care Management  Initial Transition Planning Assessment    CM spoke with [name] LCSW at [employer] and with [name] with [facility] [phone]). Pt discharged from [facility] yesterday with vent approved for delivery to home and with PDN with [facility] to resume. Medicaid was reinstated during admission to [facility]. Per [name] the vent was set up at pt's home last evening. Message left for [place] with [facility] [phone]).      Per H&P: Cameron Baker is a 27 y.o. male with PMHx as reviewed in the EMR that presented to [facility] with Fever              General  Care Manager assessed the patient by : Medical record review, Discussion with Clinical Care team  Orientation Level: Oriented X4    Contact/Decision Maker        Extended Emergency Contact Information  Primary Emergency Contact: [name] [place] of [place]  Home Phone: [phone]  Mobile Phone: [phone]  Relation: Mother    Legal Next of Kin / Guardian / POA / Advance Directives       Advance Directive (Medical Treatment)  Does patient have an advance directive covering medical treatment?: Patient would not like information., Patient does not have advance directive covering medical treatment.  Information provided on advance directive:: No  Patient requests assistance:: No         Patient Information  Lives with: Friends    Type of Residence: Private residence        Location/Detail: [address], [place] [address] phone [phone]          Medical Provider(s): [name], MD  Reason for Admission: Admitting Diagnosis:  Dyspnea, unspecified type [R06.00]  Past Medical History:   has a past medical history of ADHD, Asthma, Bipolar 1 disorder (CMS-HCC), Chronic tracheobronchitis (CMS-HCC), Constipation, De Quervain's tenosynovitis, Dysphagia, Esophageal dysmotility, Esophageal stricture, GERD (gastroesophageal reflux disease), hypothyroid, Seizures (CMS-HCC), Tracheobronchomalacia, VATER syndrome, and Ventilator dependence (CMS-HCC).  Past Surgical History:   has a past surgical history that includes Tracheostomy (1994); Hip surgery (Right, 01/19/2017); Wrist surgery; Nissen fundoplication (2001); Anal dilation (01/21/2006); Portacath placement (01/21/2006); REMOVAL VENOUS ACCESS PORT [facility] HISTORICAL RESULT) (11/03/2006); Wisdom tooth extraction (04/2012); Lateral rectus recession (11/15/2003); Strabismus surgery (Right); Esophagogastroduodenoscopy; pr up gi endoscopy,ball dil,51mm (N/A, 02/22/2018); pr esophageal motility study, manometry (N/A, 04/23/2018); pr up gi endoscopy,ball dil,5mm (N/A, 05/20/2018); IR Insert Port Age Greater Than 5 Years (06/22/2018); and pr up gi endoscopy,ball dil,21mm (N/A, 08/06/2018).   Previous admit date: 06/27/2018    Primary Insurance- Payor: Advertising copywriter MEDICARE ADV / Plan: UNITED HEALTHCARE DUAL COMPLETE HMO / Product Type: *No Product type* /   Secondary Insurance ??? Secondary Insurance  MEDICAID [place]  Prescription Coverage ??? Yes  Preferred Pharmacy - [facility] [phone] - [place], [place] - [address] AT [facility] [address]  [id number] [place], [place] - [address]  [facility]  [facility]    Transportation home: Private vehicle  Level of function prior to admission: Requires Assistance  Pt requires assistance at night while on the vent. Otherwise he is independent        Support Systems: Family Members, Parent    Responsibilities/Dependents at home?: No    Home Care services in place prior to admission?: Yes  Type of Home Care services in place prior to admission: Home health (specify)(PDN at  night while on a vent)  Current Home Care provider (Name/Phone #): [facility] [phone]    Outpatient/Community Resources in place prior to admission: Clinic  Agency detail (Name/Phone #): Pt's medicaid had been terminated prior to his admission to [facility]. Pt and SW communicated with Medicaid worker ([name], [phone]) who recertified the pt for Medicaid coverage while in-house       Current HME Agency (Name/Phone #): [facility], Trilogy, Neb, portable suction [facility] port supplies [place]    Currently receiving outpatient dialysis?: No       Financial Information       Need for financial assistance?: No       Social Determinants of Health  Social Determinants of Health were addressed in provider documentation.  Please refer to patient history.    Discharge Needs Assessment  Concerns to be Addressed: care coordination/care conferences    Clinical Risk Factors: Readmission < 72 Hours, Multiple Diagnoses (Chronic), Functional Limitations, Lives Alone or Absence of Caregiver to Assist with Discharge and Home Care    Barriers to taking medications: No    Prior overnight hospital stay or ED visit in last 90 days: Yes    Readmission Within the Last 30 Days: previous discharge plan unsuccessful         Anticipated Changes Related to Illness: none    Equipment Needed After Discharge: none    Discharge Facility/Level of Care Needs: (home to resume Private duty nursing)    Readmission  Risk of Unplanned Readmission Score: UNPLANNED READMISSION SCORE: 75%  Readmitted Within the Last 30 Days?   Patient at risk for readmission?: Yes    Discharge Plan  Screen findings are: Discharge planning needs identified or anticipated (Comment).    Expected Discharge Date: 08/19/18    Expected Transfer from Critical Care:      Patient and/or family were provided with choice of facilities / services that are available and appropriate to meet post hospital care needs?: N/A   List choices in order highest to lowest preferred, if applicable. : Choosing to continue to use [facility] and Adult and Pediatric services    Initial Assessment complete?: Yes

## 2018-08-20 NOTE — Unmapped (Signed)
Report given to Jonny Ruiz, Charity fundraiser. Patient care transferred at this time.

## 2018-08-20 NOTE — Unmapped (Signed)
Med G Daily Progress Note    Interval History: Stable on home vent settings overnight and high flow O2 this morning. Continuing Vanc/Cefepime/TMP-SMX given refeverscence after previous antibiotics as well as chronic vent dependence. Awaiting blood and sputum cultures.     Assessment/Plan:    Principal Problem:    Fever  Active Problems:    Slow transit constipation    Hypothyroidism    Gastrostomy status (CMS-HCC)    Chronic tracheobronchitis (CMS-HCC)    Tracheostomy dependence (CMS-HCC)    VATER syndrome    PEG (percutaneous endoscopic gastrostomy) status (CMS-HCC)    History of home ventilator (CMS-HCC)    Bipolar 1 disorder (CMS-HCC)    Central sleep apnea  Resolved Problems:    * No resolved hospital problems. *             Cameron Baker is a 27 y.o. male with VATER syndrome c/b tracheobronchomalacia s/p trach/PEG, nocturnal vent dependence due to central sleep apnea, frequent admissions for tracheobronchitis, and esophageal stenosis, who presented to [facility] after [facility] noted O2 sats 85-89% and fever to 102F, 9 hours after discharge from [facility] where he has treated for S. maltophilia tracheobronchitis with IV zosyn then PO bactrim. Upon further workup, he was found to be SIRS positive with new CXR infiltrates and leukocytosis. This is concerning for persistent worsening of prior infection versus a new acute pulmonary process.     #Pneumonitis    #Stenotrophomonas tracheitis/bronchitis & Rhinovirus infection  Discharged from [facility] 9 hours prior to presentation. Per chart review, the patient was febrile to 101.7 F overnight before discharge, but had otherwise clinically improved with antibiotics. Now with new bilateral patchy CXR infiltrates and hypoxia. The differential includes aspiration pneumonitis given history of esophageal stenosis recently dilated & PEG tube, vs ventilator-associated pneumonia vs persistent tracheobronchitis or viral pneumonia given CXR picture and + Rhinovirus. This is unlikely volume overload given normal echo 11/2017. Per chart review of notes from his pulmonologist, recurrent aspiration has been a concern. Other sources of infection are unlikely given UA normal, and no new skin lesions.  - Day 3/5 TMP SMX  - CTN Vanc/Cefepime (day #2)   - f/u BCx, LRCx     #VATER syndrome-associated tracheobronchomalacia s/p trach/PEG and nocturnal mechanical ventilation.   #frequent tracheobronchitis admissions   - Continue aformoterol nebs BID   - Continue budesonide nebs BID  - Continue TOBI nebs (hx chronic PsA)  - airway clearance: must use cough assist with oscillation & Duonebs prior to 3% HTS  - continue azithromycin MWF   - continue usual home Trilogy vent settings at night (AVAPS, VT 525 ml, PEEP 10 cm H2O)  - nutrition assistance with tube feeds     #Type 2 achalasia/ Esophageal stenosis  Balloon dilation of 2 esophageal stenoses 08/06/2018 during previous admission. New infiltrates are concerning for aspiration.     #Bipolar 1 disorder - PTSD - Anxiety. ??  He has followed with [facility] and undergone psychotherapy previously.??   - Continue home citalopram, valproate, mirtazapine, lorazepam 0.5 mg PO BID prn  ??  #Microcytic anemia  at baseline  ??  #Hypothyroidism  continue home synthroid 75 mcg daily  ??  #Slow transit constipation  continue??linaclotide  continue bentyl QID prn  ??  #Lower extremity muscle spasms  Continue cyclobenzaprine prn  ??  #Acne  D/c minocycline given vancomycin administration     #Social  His Medicaid apparently lapsed and resulted in his inability to have necessary at-home nursing care at  night for his Trilogy ventilator. This was apparently resolved during his previous hospitalization    __________________________________________________________________    Subjective:  No acute events overnight   Feels about the same, but no fevers, chills, or myalgias. Endorses nausea, no appetite.   Voiding independently. BM yesterday morning.     Recent Results (from the past 24 hour(s))   Lactic Acid, Venous, Whole Blood    Collection Time: 08/19/18  9:57 PM   Result Value Ref Range    Lactate, Venous 1.1 0.5 - 1.8 mmol/L   Blood Gas, Venous    Collection Time: 08/19/18  9:57 PM   Result Value Ref Range    Specimen Source Venous     FIO2 Venous Not Specified     pH, Venous 7.43 7.32 - 7.43    pCO2, Ven 43 40 - 60 mm Hg    pO2, Ven 34 30 - 55 mm Hg    HCO3, Ven 28 (H) 22 - 27 mmol/L    Base Excess, Ven 4.1 (H) -2.0 - 2.0    O2 Saturation, Venous 59.5 40.0 - 85.0 %   Comprehensive Metabolic Panel    Collection Time: 08/19/18  9:59 PM   Result Value Ref Range    Sodium 136 135 - 145 mmol/L    Potassium 4.1 3.5 - 5.0 mmol/L    Chloride 97 (L) 98 - 107 mmol/L    CO2 28.0 22.0 - 30.0 mmol/L    BUN 9 7 - 21 mg/dL    Creatinine 1.61 0.96 - 1.30 mg/dL    BUN/Creatinine Ratio 9     EGFR CKD-EPI Non-African American, Male >90 >=60 mL/min/1.2m2    EGFR CKD-EPI African American, Male >90 >=60 mL/min/1.72m2    Glucose 110 65 - 179 mg/dL    Calcium 8.9 8.5 - 04.5 mg/dL    Albumin 4.0 3.5 - 5.0 g/dL    Total Protein 7.2 6.5 - 8.3 g/dL    Total Bilirubin 0.5 0.0 - 1.2 mg/dL    AST 43 19 - 55 U/L    ALT 57 19 - 72 U/L    Alkaline Phosphatase 83 38 - 126 U/L    Anion Gap 11 9 - 15 mmol/L   CBC w/ Differential    Collection Time: 08/19/18  9:59 PM   Result Value Ref Range    WBC 14.3 (H) 4.5 - 11.0 10*9/L    RBC 4.57 4.50 - 5.90 10*12/L    HGB 11.0 (L) 13.5 - 17.5 g/dL    HCT 40.9 (L) 81.1 - 53.0 %    MCV 74.5 (L) 80.0 - 100.0 fL    MCH 24.1 (L) 26.0 - 34.0 pg    MCHC 32.3 31.0 - 37.0 g/dL    RDW 91.4 (H) 78.2 - 15.0 %    MPV 6.9 (L) 7.0 - 10.0 fL    Platelet 325 150 - 440 10*9/L    Neutrophils % 72.4 %    Lymphocytes % 14.2 %    Monocytes % 8.0 %    Eosinophils % 1.1 %    Basophils % 0.9 %    Absolute Neutrophils 10.4 (H) 2.0 - 7.5 10*9/L    Absolute Lymphocytes 2.0 1.5 - 5.0 10*9/L    Absolute Monocytes 1.2 (H) 0.2 - 0.8 10*9/L    Absolute Eosinophils 0.2 0.0 - 0.4 10*9/L    Absolute Basophils 0.1 0.0 - 0.1 10*9/L    Large Unstained Cells 3 0 - 4 %    Microcytosis Marked (  A) Not Present    Anisocytosis Slight (A) Not Present    Hypochromasia Marked (A) Not Present   Lactate, Venous, Whole Blood    Collection Time: 08/20/18  2:20 AM   Result Value Ref Range    Lactate, Venous 0.7 0.5 - 1.8 mmol/L   Urinalysis    Collection Time: 08/20/18  2:28 AM   Result Value Ref Range    Color, UA Light Yellow     Clarity, UA Clear     Specific Gravity, UA 1.005 1.003 - 1.030    pH, UA 8.0 5.0 - 9.0    Leukocyte Esterase, UA Negative Negative    Nitrite, UA Negative Negative    Protein, UA Negative Negative    Glucose, UA Negative Negative    Ketones, UA Negative Negative    Urobilinogen, UA 0.2 mg/dL     Bilirubin, UA Negative Negative    Blood, UA Trace (A) Negative    RBC, UA <1 <=3 /HPF    WBC, UA <1 <=2 /HPF    Squam Epithel, UA <1 0 - 5 /HPF    Bacteria, UA Occasional (A) None Seen /HPF    Mucus, UA Rare (A) None Seen /HPF   Basic metabolic panel    Collection Time: 08/20/18  2:57 AM   Result Value Ref Range    Sodium 140 135 - 145 mmol/L    Potassium 4.5 3.5 - 5.0 mmol/L    Chloride 103 98 - 107 mmol/L    CO2 26.0 22.0 - 30.0 mmol/L    Anion Gap 11 9 - 15 mmol/L    BUN 9 7 - 21 mg/dL    Creatinine 6.04 5.40 - 1.30 mg/dL    BUN/Creatinine Ratio 9     EGFR CKD-EPI Non-African American, Male >90 >=60 mL/min/1.87m2    EGFR CKD-EPI African American, Male >90 >=60 mL/min/1.9m2    Glucose 149 65 - 179 mg/dL    Calcium 8.2 (L) 8.5 - 10.2 mg/dL   CBC    Collection Time: 08/20/18  2:57 AM   Result Value Ref Range    WBC 12.9 (H) 4.5 - 11.0 10*9/L    RBC 4.43 (L) 4.50 - 5.90 10*12/L    HGB 10.6 (L) 13.5 - 17.5 g/dL    HCT 98.1 (L) 19.1 - 53.0 %    MCV 75.5 (L) 80.0 - 100.0 fL    MCH 23.9 (L) 26.0 - 34.0 pg    MCHC 31.6 31.0 - 37.0 g/dL    RDW 47.8 (H) 29.5 - 15.0 %    MPV 7.2 7.0 - 10.0 fL    Platelet 293 150 - 440 10*9/L     Labs/Studies:  Labs and Studies from the last 24hrs per EMR and Reviewed    Objective:  BP 162/80  - Pulse 99  - Temp 36.4 ??C (97.5 ??F) (Axillary)  - Resp 17  - Ht 167.6 cm (5' 6)  - Wt (!) 103.9 kg (229 lb 0.9 oz)  - SpO2 99%  - BMI 36.97 kg/m??     General: Resting male breathing on Trilogy vent. Anxious appearing, but in no acute distress  Eyes: Anicteric sclera, conjunctiva clear.  ENT: Tracheostomy present.  Lungs: Coarse breath sounds throughout. No respiratory distress  Cardiovascular: Regular rate and rhythm, S1, S2 splitting, no murmur, click, rub or gallop appreciated.  Abdomen: Soft, non-tender, bowel sounds are normal. G tube present.   Musculoskeletal: No clubbing and no synovitis.  Skin: 1 cm ulcerated though scabbed  lesion on left dorsum of the hand. No other rashes or lesions.  Neuro: CN2-12 intact. No focal deficits.  ??    Scherrie Merritts, MS-IV    I attest that I have reviewed the student note and that the components of the history of the present illness, the physical exam, and the assessment and plan documented were performed by me or were performed in my presence by the student where I verified the documentation and performed (or re-performed) the exam and medical decision making.   Harland Dingwall, MD  PGY-1 Internal Medicine Resident  Pager: [phone]

## 2018-08-20 NOTE — Unmapped (Signed)
A&Ox4; VSS on 50% trach mask and trilogy vent when sleeping; no complaints of pain; PRN phernegan for nausea; fall precautions in place; will continue to monitor      Problem: Adult Inpatient Plan of Care  Goal: Plan of Care Review  Outcome: Ongoing - Unchanged  Goal: Patient-Specific Goal (Individualization)  Outcome: Ongoing - Unchanged  Goal: Absence of Hospital-Acquired Illness or Injury  Outcome: Ongoing - Unchanged  Goal: Optimal Comfort and Wellbeing  Outcome: Ongoing - Unchanged  Goal: Readiness for Transition of Care  Outcome: Ongoing - Unchanged  Goal: Rounds/Family Conference  Outcome: Ongoing - Unchanged     Problem: Venous Thromboembolism  Goal: VTE (Venous Thromboembolism) Symptom Resolution  Outcome: Ongoing - Unchanged     Problem: Fall Injury Risk  Goal: Absence of Fall and Fall-Related Injury  Outcome: Ongoing - Unchanged

## 2018-08-20 NOTE — Unmapped (Signed)
[facility]  Emergency Department Provider Note    ED Clinical Impression     Final diagnoses:   Dyspnea, unspecified type (Primary)     Initial Impression, ED Course, Assessment and Plan     Impression: Cameron Baker is a 27 y.o. 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 Home health nurse noted tonight that patient was saturating 89% with desats to 85%, tachycardic, and febrile to 102F. Will obtain portable CXR, CBC, CMP, blood cultures x2, VBG, and lactate. Will give IV fluids, his scheduled dose of Bactrim, and start on IV vancomycin and Cefepime. Will also give 4 mg morphine.     9:58 PM  CXR with worsening patchy opaicities. Given ongoing fever, respiratory distress and worsening CXR will broaden antibiotics with Vanc/cefepime and give Septra.     Additional Medical Decision Making     I have reviewed the vital signs and the nursing notes. Labs and radiology results that were available during my care of the patient were independently reviewed by me and considered in my medical decision making.     I have staffed the case with the ED Attending, Dr. Filbert Berthold Platts*.    Portions of this record have been created using Scientist, clinical (histocompatibility and immunogenetics). Dictation errors have been sought, but may not have been identified and corrected.  ____________________________________________       History     Chief Complaint  Shortness of Breath      HPI   Cameron Baker is a 27 y.o. 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 Patient was recently hospitalized from 8/21 until today for stenotrophomonas maltophilia tracheitis and bronchitis. He was treated with Zosyn initially and transitioned to trimethoprim/sulfa and discharged home with the same. Patient reports that he went home and his home health nurse visited him this afternoon. His home health nurse called his doctor after she recorded his vitals; he was satting 89% and desaturating down to 85%, tachycardic to 120, and febrile to 102F. Patient currently complains of shortness of breath. He also has right-sided chest/rib pain that is worsened when he takes a deep breath. He also complains of nasal congestion. He has had mucus from his trachea; worse than his baseline. No calf pain or swelling. No prior PE or DVT.      Past Medical History:   Diagnosis Date   ??? ADHD    ??? Asthma    ??? Bipolar 1 disorder (CMS-HCC)    ??? Chronic tracheobronchitis (CMS-HCC)    ??? Constipation    ??? De Quervain's tenosynovitis    ??? Dysphagia    ??? Esophageal dysmotility    ??? Esophageal stricture    ??? GERD (gastroesophageal reflux disease)    ??? hypothyroid    ??? Seizures (CMS-HCC)    ??? Tracheobronchomalacia    ??? VATER syndrome    ??? Ventilator dependence (CMS-HCC)        Patient Active Problem List   Diagnosis   ??? Asthma   ??? Attention deficit disorder   ??? Slow transit constipation   ??? Dysphagia   ??? Esophageal reflux   ???  Hypothyroidism   ??? Class 1 obesity in adult   ??? Seizures (CMS-HCC)   ??? Gastrostomy status (CMS-HCC)   ??? Stricture and stenosis of esophagus   ??? Post traumatic stress disorder (PTSD)   ??? Mood disorder (CMS-HCC)   ??? Personality disorder (CMS-HCC)   ??? Chronic tracheobronchitis (CMS-HCC)   ??? Congenital tracheomalacia   ??? Tracheostomy dependence (CMS-HCC)   ??? VATER syndrome   ??? Muscle spasms of both lower extremities   ??? Hypogammaglobulinemia (CMS-HCC)   ??? Triangular fibrocartilage complex injury, left, subsequent encounter   ??? Enthesopathy of right hip region   ??? Dyspnea   ??? Acute asthma exacerbation   ??? Acute respiratory distress    ??? Nausea   ??? Skin lesion   ??? Acne vulgaris   ??? Pain of hand   ??? PEG (percutaneous endoscopic gastrostomy) status (CMS-HCC)   ??? History of home ventilator (CMS-HCC)   ??? Bipolar 1 disorder (CMS-HCC)   ??? Esophageal achalasia   ??? Cellulitis of left hand   ??? Social problem   ??? Central sleep apnea       Past Surgical History:   Procedure Laterality Date   ??? ANAL DILATION  01/21/2006   ??? ESOPHAGOGASTRODUODENOSCOPY      multiple   ??? HIP SURGERY Right 01/19/2017    shaved   ??? IR INSERT PORT AGE GREATER THAN 5 YRS  06/22/2018    IR INSERT PORT AGE GREATER THAN 5 YRS 06/22/2018 Andres Labrum, MD IMG VIR [facility]   ??? LATERAL RECTUS RECESSION  11/15/2003   ??? NISSEN FUNDOPLICATION  2001    x 2 redos   ??? PORTACATH PLACEMENT  01/21/2006    taken out same year   ??? PR ESOPHAGEAL MOTILITY STUDY, MANOMETRY N/A 04/23/2018    Procedure: ESOPHAGEAL MOTILITY STUDY W/INT & REP;  Surgeon: Nurse-Based Giproc;  [facility]: [facility];  Service: Gastroenterology   ??? PR UP GI ENDOSCOPY,BALL DIL,30MM N/A 02/22/2018    Procedure: UGI ENDO; W/BALLOON DILAT ESOPHAGUS (<30MM DIAM);  Surgeon: Zetta Bills, MD;  Location: [facility];  Service: Gastroenterology   ??? PR UP GI ENDOSCOPY,BALL DIL,30MM N/A 05/20/2018    Procedure: UGI ENDO; W/BALLOON DILAT ESOPHAGUS (<30MM DIAM);  Surgeon: Rona Ravens, MD;  Location: [facility];  Service: Gastroenterology   ??? PR UP GI ENDOSCOPY,BALL DIL,30MM N/A 08/06/2018    Procedure: UGI ENDO; W/BALLOON DILAT ESOPHAGUS (<30MM DIAM);  Surgeon: Annie Paras, MD;  Location: [facility];  Service: Gastroenterology   ??? REMOVAL VENOUS ACCESS PORT [facility] HISTORICAL RESULT)  11/03/2006   ??? STRABISMUS SURGERY Right    ??? TRACHEOSTOMY  1994   ??? WISDOM TOOTH EXTRACTION  04/2012   ??? WRIST SURGERY      3 times.        No current facility-administered medications for this encounter.     Current Outpatient Medications:   ???  acetaminophen (MAPAP, ACETAMINOPHEN,) 325 MG tablet, Take 1 tablet (325 mg total) by mouth every six (6) hours as needed for pain., Disp: 90 tablet, Rfl: 3  ???  albuterol HFA 90 mcg/actuation inhaler, Inhale 2 puffs every six (6) hours as needed for wheezing., Disp: 1 Inhaler, Rfl: 11  ???  arformoterol (BROVANA), Inhale 2 mL (15 mcg total) by nebulization Two (2) times a day., Disp: 120 mL, Rfl: 11  ???  azithromycin (ZITHROMAX) 500 MG tablet, 1 tablet (500 mg total) by G-tube route 3 (three) times a week.,  Disp: 12 tablet, Rfl: 11  ???  budesonide (PULMICORT) 0.5 mg/2 mL nebulizer solution, Inhale 4 mL (1 mg total) by nebulization Two (2) times a day., Disp: 240 mL, Rfl: 11  ???  budesonide-formoterol (SYMBICORT) 160-4.5 mcg/actuation inhaler, Inhale 2 puffs Two (2) times a day., Disp: 1 Inhaler, Rfl: 11  ???  cetirizine (ZYRTEC) 10 MG tablet, 1 tablet (10 mg total) by G-tube route every evening., Disp: , Rfl: 0  ???  cholecalciferol, vitamin D3, (VITAMIN D3) 2,000 unit cap, 3 capsules (6,000 Units total) by G-tube route daily., Disp: , Rfl: 0  ???  citalopram (CELEXA) 40 MG tablet, 1 tablet (40 mg total) by G-tube route nightly., Disp: 90 tablet, Rfl: 0  ???  clindamycin (CLINDAGEL) 1 % gel, Apply topically Two (2) times a day., Disp: 60 g, Rfl: 6  ???  cyclobenzaprine (FLEXERIL) 5 MG tablet, Take 1 tablet (5 mg total) by mouth Three (3) times a day as needed for muscle spasms., Disp: 15 tablet, Rfl: 0  ???  diclofenac sodium (VOLTAREN) 1 % gel, Apply 2 g topically 4 (four) times a day as needed., Disp: 100 g, Rfl: 4  ???  dicyclomine (BENTYL) 10 mg capsule, 10 mg by G-tube route 4 (four) times a day as needed. , Disp: , Rfl:   ???  fluticasone (FLONASE) 50 mcg/actuation nasal spray, 2 sprays by Each Nare route daily. , Disp: , Rfl:   ???  gabapentin (NEURONTIN) 300 MG capsule, 1 capsule (300 mg total) by G-tube route Three (3) times a day., Disp: 180 capsule, Rfl: 3 ???  ibuprofen (ADVIL,MOTRIN) 600 MG tablet, 1 tablet (600 mg total) by G-tube route every six (6) hours as needed for pain., Disp: 30 tablet, Rfl: 1  ???  ipratropium-albuterol (DUO-NEB) 0.5-2.5 mg/3 mL nebulizer, Inhale 3 mL 4 (four) times a day. , Disp: , Rfl:   ???  levothyroxine (SYNTHROID, LEVOTHROID) 75 MCG tablet, 1 tablet (75 mcg total) by G-tube route daily., Disp: 90 tablet, Rfl: 3  ???  lidocaine-prilocaine (EMLA) cream, APPLY TOPICALLY DAILY AS NEEDED FOR PORT ACCESS, Disp: 30 g, Rfl: 0  ???  linaclotide (LINZESS) 290 mcg capsule, 1 capsule (290 mcg total) by G-tube route daily., Disp: 30 capsule, Rfl: 0  ???  LORazepam (ATIVAN) 0.5 MG tablet, Take 1 tablet (0.5mg ) twice daily and 1 tablet (0.5mg ) daily as needed for anxiety via G-tube, Disp: 70 tablet, Rfl: 2  ???  minocycline (MINOCIN,DYNACIN) 100 MG capsule, 1 capsule (100 mg total) by G-tube route nightly., Disp: 90 capsule, Rfl: 3  ???  mirtazapine (REMERON) 15 MG tablet, 1 tablet (15 mg total) by G-tube route nightly., Disp: 90 tablet, Rfl: 0  ???  montelukast (SINGULAIR) 10 mg tablet, Take 1 tablet (10 mg total) by mouth nightly., Disp: 30 tablet, Rfl: 11  ???  nebulizers Misc, Provide 1 device  for use with inhaled medication., Disp: 2 each, Rfl: 11  ???  omeprazole (PRILOSEC) 20 MG capsule, 20 mg Two (2) times a day (30 minutes before a meal). Via G-tube, Disp: , Rfl:   ???  polyethylene glycol (MIRALAX) 17 gram packet, 17 g by G-tube route daily as needed. (Patient not taking: Reported on 07/23/2018), Disp: 20 packet, Rfl: 0  ???  sodium chloride (NS) 0.9 % injection, INFUSE INTO VENOUS CATHETER 4 TIMES DAILY AS NEEDED (PORT FLUSH), Disp: 160 mL, Rfl: 0  ???  sodium chloride (NS) 0.9 % Soln, Infuse 10 mL into a venous catheter 4 (four) times a day  as needed (port flush)., Disp: 60 vial, Rfl: 0  ???  sodium chloride 3 % nebulizer solution, Inhale 4 mL by nebulization 4 (four) times a day., Disp: 480 mL, Rfl: 1  ???  sulfamethoxazole-trimethoprim (BACTRIM,SEPTRA) 200-40 mg/5 mL suspension, Take 20 mL by mouth every twelve (12) hours. for 4 days, Disp: 160 mL, Rfl: 0  ???  tobramycin, PF, (TOBI) 300 mg/5 mL nebulizer solution, INHALE (300MG ) VIA NEBULIZER EVERY TWELVE HOURS EVERY OTHER MONTH, Disp: 280 mL, Rfl: 5  ???  triamcinolone (KENALOG) 0.1 % cream, Apply topically Two (2) times a day., Disp: 15 g, Rfl: 11  ???  valproate (DEPAKENE) 250 mg/5 mL syrup, Take 5ml (250mg ) by G-tube every morning and 15ml (750mg ) every evening, Disp: 1850 mL, Rfl: 0    Allergies  Xopenex [levalbuterol hcl]; Allopurinol analogues; and Versed [midazolam]    Family History   Adopted: Yes       Social History  Social History     Tobacco Use   ??? Smoking status: Never Smoker   ??? Smokeless tobacco: Never Used   Substance Use Topics   ??? Alcohol use: Yes     Alcohol/week: 2.0 standard drinks     Types: 2 Cans of beer per week     Comment: on weekends sometimes   ??? Drug use: No       Review of Systems  Constitutional: Negative for fever.  Eyes: Negative for visual changes.  ENT: Negative for sore throat.  CV: Negative for chest pain.  Resp: Positive for shortness of breath.  GI: Negative for abdominal pain.  GU: Negative for dysuria.  MSK: Negative for back pain.  Derm: Negative for rash.  Neuro: Negative for headaches.      Physical Exam     ED Triage Vitals [08/19/18 2140]   Enc Vitals Group      BP 132/85      Heart Rate 107      SpO2 Pulse       Resp       Temp 36.7 ??C (98.1 ??F)      Temp Source Oral      SpO2 100 %      Weight      Constitutional: Appears stated age.  HEENT: Normocephalic. Conjunctivae clear. No congestion. Moist mucous membranes.   Heme/Lymph/Immuno: No petechiae or bruising  CV: tachycardic rate and regular rhythm. Warm well perfused.   Resp: Rhonchorous, R>L  GI: Soft, non tender, non distended.   GU: No suprapubic tenderness  MSK: Normal range of motion in all extremities.  Neuro: Normal speech. No gross focal neurologic deficits appreciated.  Skin: Warm, dry, intact.  Psychiatric: Mood and affect are normal.     Radiology     XR Chest Portable   Preliminary Result   Diffuse patchy opacities throughout both lungs may reflect multifocal infection or pulmonary edema.           Documentation assistance was provided by Daine Gravel, Scribe, on August 19, 2018 at 9:52 PM for Lindwood Coke, MD.    Documentation assistance was provided by the scribe in my presence.  The documentation recorded by the scribe has been reviewed by me and accurately reflects the services I personally performed.             Loma Sousa, MD  Resident  08/19/18 [phone]

## 2018-08-21 LAB — BLOOD UREA NITROGEN: Urea nitrogen:MCnc:Pt:Ser/Plas:Qn:: 9

## 2018-08-21 LAB — BASIC METABOLIC PANEL
ANION GAP: 11 mmol/L (ref 9–15)
BLOOD UREA NITROGEN: 9 mg/dL (ref 7–21)
BUN / CREAT RATIO: 11
CALCIUM: 9.3 mg/dL (ref 8.5–10.2)
CHLORIDE: 98 mmol/L (ref 98–107)
CO2: 28 mmol/L (ref 22.0–30.0)
CREATININE: 0.81 mg/dL (ref 0.70–1.30)
EGFR CKD-EPI NON-AA MALE: >90 mL/min/{1.73_m2} (ref >=60)
GLUCOSE RANDOM: 108 mg/dL (ref 65–179)
POTASSIUM: 4.5 mmol/L (ref 3.5–5.0)
SODIUM: 137 mmol/L (ref 135–145)

## 2018-08-21 LAB — CBC
HEMATOCRIT: 33.5 % — ABNORMAL LOW (ref 41.0–53.0)
MEAN CORPUSCULAR HEMOGLOBIN CONC: 31.2 g/dL (ref 31.0–37.0)
MEAN CORPUSCULAR HEMOGLOBIN: 23.8 pg — ABNORMAL LOW (ref 26.0–34.0)
MEAN CORPUSCULAR VOLUME: 76.4 fL — ABNORMAL LOW (ref 80.0–100.0)
MEAN PLATELET VOLUME: 8.7 fL (ref 7.0–10.0)
PLATELET COUNT: 244 10*9/L (ref 150–440)
RED BLOOD CELL COUNT: 4.38 10*12/L — ABNORMAL LOW (ref 4.50–5.90)
RED CELL DISTRIBUTION WIDTH: 17.3 % — ABNORMAL HIGH (ref 12.0–15.0)
WBC ADJUSTED: 11.5 10*9/L — ABNORMAL HIGH (ref 4.5–11.0)

## 2018-08-21 LAB — MEAN CORPUSCULAR HEMOGLOBIN CONC: Lab: 31.2

## 2018-08-21 LAB — VANCOMYCIN RANDOM: Vancomycin^random:MCnc:Pt:Ser/Plas:Qn:: 14.6

## 2018-08-21 NOTE — Unmapped (Signed)
Pt. Is doing well on trach collar at this time. He will go on trilogy tonight.

## 2018-08-21 NOTE — Unmapped (Signed)
Patient was placed on the trilogy around 0250. Tolerating it well. He received his inhaled respiratory medications this shift. Will continue to monitor.

## 2018-08-21 NOTE — Unmapped (Signed)
Pt alert and oriented, resting comfortably at this time. No complaints of pain. Pt sleeping on trilogy ventilator. O2 sats WNL. ABX given as orderd. VSS. Will continue to monitor.      Problem: Adult Inpatient Plan of Care  Goal: Plan of Care Review  Outcome: Progressing  Goal: Patient-Specific Goal (Individualization)  Outcome: Progressing  Goal: Absence of Hospital-Acquired Illness or Injury  Outcome: Progressing  Goal: Optimal Comfort and Wellbeing  Outcome: Progressing  Goal: Readiness for Transition of Care  Outcome: Progressing  Goal: Rounds/Family Conference  Outcome: Progressing     Problem: Venous Thromboembolism  Goal: VTE (Venous Thromboembolism) Symptom Resolution  Outcome: Progressing     Problem: Fall Injury Risk  Goal: Absence of Fall and Fall-Related Injury  Outcome: Progressing

## 2018-08-21 NOTE — Unmapped (Signed)
Weaned to 28% aerosol trach collar SpO2 >92% throughout shift.  Nebs given as ordered.  Thick yellow secretions, sputum culture sent.  Re-assess nebulized tobramycin order, scheduled to end 08/21/18 per patient instead of current order. Plan: AVAPS at night per home use, add heated humidity to assist mucous clearance.

## 2018-08-21 NOTE — Unmapped (Signed)
Vancomycin Therapeutic Monitoring Pharmacy Note    Cameron Baker is a 27 y.o. male continuing vancomycin. Date of therapy initiation: 08/19/18    Indication: Pneumonia    Prior Dosing Information: Current regimen Vancomycin 1500mg  IV Q8hrs      Goals:  Therapeutic Drug Levels  Vancomycin trough goal: 15-20 mg/L    Additional Clinical Monitoring/Outcomes  Renal function, volume status (intake and output)    Results: Vancomycin level 14.6 mg/L, drawn appropriately    Wt Readings from Last 1 Encounters:   08/20/18 (!) 103.9 kg (229 lb 0.9 oz)     Creatinine   Date Value Ref Range Status   08/21/2018 0.81 0.70 - 1.30 mg/dL Final   09/81/1914 7.82 0.70 - 1.30 mg/dL Final   95/62/1308 6.57 0.70 - 1.30 mg/dL Final        Pharmacokinetic Considerations and Significant Drug Interactions:  ? Adult (calculated on 08/21/18): Vd = 62.7 L, ke = 0.13 hr-1  ? Concurrent nephrotoxic meds: not applicable    Assessment/Plan:  Recommendation(s)  ? Continue current regimen of Vancomycin 1500mg  IV Q8hrs  ? Estimated trough on recommended regimen: 15 mg/L    Follow-up  ? Level due: in 3-5 days.   ? A pharmacist will continue to monitor and order levels as appropriate    Please page service pharmacist with questions/clarifications.    Wynn Banker, PharmD   PGY2 Critical Care Resident

## 2018-08-21 NOTE — Unmapped (Signed)
Patient remains alert and oriented. Patient on 28% trach collar. Patient preforming trach suctioning independently. Phenergan given for nausea. Appetite remains diminished. UOP adequate. Lovenox for VTE prophylaxis. All safety measures in place.     Problem: Adult Inpatient Plan of Care  Goal: Plan of Care Review  Outcome: Progressing  Goal: Patient-Specific Goal (Individualization)  Outcome: Progressing  Goal: Absence of Hospital-Acquired Illness or Injury  Outcome: Progressing  Goal: Optimal Comfort and Wellbeing  Outcome: Progressing  Goal: Readiness for Transition of Care  Outcome: Progressing  Goal: Rounds/Family Conference  Outcome: Progressing     Problem: Venous Thromboembolism  Goal: VTE (Venous Thromboembolism) Symptom Resolution  Outcome: Progressing     Problem: Fall Injury Risk  Goal: Absence of Fall and Fall-Related Injury  Outcome: Progressing

## 2018-08-21 NOTE — Unmapped (Signed)
Daily Progress Note    Assessment/Plan:    Principal Problem:    Fever  Active Problems:    Slow transit constipation    Hypothyroidism    Gastrostomy status (CMS-HCC)    Chronic tracheobronchitis (CMS-HCC)    Tracheostomy dependence (CMS-HCC)    VATER syndrome    PEG (percutaneous endoscopic gastrostomy) status (CMS-HCC)    History of home ventilator (CMS-HCC)    Bipolar 1 disorder (CMS-HCC)    Central sleep apnea  Resolved Problems:    * No resolved hospital problems. *        @LDAPRESSUREULCER @     Cameron Baker is a 27 y.o. male with VATER syndrome c/b tracheobronchomalacia s/p trach/PEG, nocturnal vent dependence due to central sleep apnea, frequent admissions for tracheobronchitis, and esophageal stenosis, who presented to [facility] after [facility] noted O2 sats 85-89% and fever to 102F, 9 hours after discharge from [facility] where he has treated for S. maltophilia tracheobronchitis with IV zosyn then PO bactrim. Upon further workup, he was found to be SIRS positive with new CXR infiltrates and leukocytosis. This is concerning for persistent worsening of prior infection versus a new acute pulmonary process.     #Pneumonitis    #Stenotrophomonas tracheitis/bronchitis & Rhinovirus infection  Discharged from [facility] 9 hours prior to presentation. Per chart review, the patient was febrile to 101.7 F overnight before discharge, but had otherwise clinically improved with antibiotics. Now with new bilateral patchy CXR infiltrates and hypoxia. The differential includes aspiration pneumonitis given history of esophageal stenosis recently dilated & PEG tube, vs ventilator-associated pneumonia vs persistent tracheobronchitis or viral pneumonia given CXR picture and + Rhinovirus. This is unlikely volume overload given normal echo 11/2017. Per chart review of notes from his pulmonologist, recurrent aspiration has been a concern. Other sources of infection are unlikely given UA normal, and no new skin lesions.  Respiratory culture showed normal flora on 08/21/2018  - TMP-SMX stop date 08/22/2018  - CTN Vanc/Cefepime (day #3)   - f/u BCx, can de-escalate if negative by 48 hours    #VATER syndrome-associated tracheobronchomalacia s/p trach/PEG and nocturnal mechanical ventilation.   #frequent tracheobronchitis admissions   - Continue aformoterol nebs BID   - Continue budesonide nebs BID  - Continue TOBI nebs (hx chronic PsA)  - airway clearance: must use cough assist with oscillation & Duonebs prior to 3% HTS  - continue azithromycin MWF   - continue usual home Trilogy vent settings at night (AVAPS, VT 525 ml, PEEP 10 cm H2O)  - nutrition assistance with tube feeds     #Type 2 achalasia/ Esophageal stenosis  Balloon dilation of 2 esophageal stenoses 08/06/2018 during previous admission. New infiltrates are concerning for aspiration.   ??  #Bipolar 1 disorder??-??PTSD - Anxiety.????  He has followed with [facility] and undergone psychotherapy previously.??   - Continue home citalopram, valproate, mirtazapine, lorazepam 0.5 mg PO BID prn  ??  #Microcytic anemia  at baseline  ??  #Hypothyroidism  continue home synthroid 75 mcg daily  ??  #Slow transit constipation  continue??linaclotide  continue bentyl QID prn  ??  #Lower extremity muscle spasms  Continue cyclobenzaprine prn  ??  #Acne  D/c minocycline given vancomycin administration   ??  #Social  His Medicaid apparently lapsed and resulted in his inability to have necessary at-home nursing care at night for his Trilogy ventilator.??This was apparently resolved during his previous hospitalization    #DVT ppx: lovenox 40mg  dailyi    #Code status: full code  ___________________________________________________________________    Subjective:  No acute events overnight, Denies CP or SOB, patient reports nausea with antibiotic treatment now and in past.  otherwise breathing has improved    Recent Results (from the past 24 hour(s))   Vancomycin, Random    Collection Time: 08/21/18  8:21 AM   Result Value Ref Range Vancomycin Rm 14.6 Undefined ug/mL   Basic Metabolic Panel    Collection Time: 08/21/18  8:21 AM   Result Value Ref Range    Sodium 137 135 - 145 mmol/L    Potassium 4.5 3.5 - 5.0 mmol/L    Chloride 98 98 - 107 mmol/L    CO2 28.0 22.0 - 30.0 mmol/L    Anion Gap 11 9 - 15 mmol/L    BUN 9 7 - 21 mg/dL    Creatinine 5.28 4.13 - 1.30 mg/dL    BUN/Creatinine Ratio 11     EGFR CKD-EPI Non-African American, Male >90 >=60 mL/min/1.62m2    EGFR CKD-EPI African American, Male >90 >=60 mL/min/1.27m2    Glucose 108 65 - 179 mg/dL    Calcium 9.3 8.5 - 24.4 mg/dL   CBC    Collection Time: 08/21/18  8:21 AM   Result Value Ref Range    WBC 11.5 (H) 4.5 - 11.0 10*9/L    RBC 4.38 (L) 4.50 - 5.90 10*12/L    HGB 10.4 (L) 13.5 - 17.5 g/dL    HCT 01.0 (L) 27.2 - 53.0 %    MCV 76.4 (L) 80.0 - 100.0 fL    MCH 23.8 (L) 26.0 - 34.0 pg    MCHC 31.2 31.0 - 37.0 g/dL    RDW 53.6 (H) 64.4 - 15.0 %    MPV 8.7 7.0 - 10.0 fL    Platelet 244 150 - 440 10*9/L     Labs/Studies:  Labs and Studies from the last 24hrs per EMR and Reviewed    Objective:  Temp:  [35.9 ??C-37.1 ??C] 37.1 ??C  Heart Rate:  [81-99] 85  SpO2 Pulse:  [81-99] 90  Resp:  [14-25] 17  BP: (120-151)/(70-95) 140/82  FiO2 (%):  [28 %-35 %] 28 %  SpO2:  [92 %-100 %] 99 %    General: awake and alert, no acute distress  HENT: tracheostomy in place,   Lungs: no rales, rhonchi or wheezes, synchronous with device, no accessory muscle use.   Cardiac: Regular rate and rhythm, no murmurs, gallops or rubs  Abdomen: soft, non-tender, bowel sounds are normal, G tube present  MSK: no clubbing or synovitis  Skin: warm, dry, intact  Neuro: CN 2-12 intact. Moves all limbs spontaneously.       Buren Kos DO PGY-3 Visiting Resident

## 2018-08-22 LAB — BASIC METABOLIC PANEL
ANION GAP: 10 mmol/L (ref 9–15)
BLOOD UREA NITROGEN: 10 mg/dL (ref 7–21)
CALCIUM: 8.8 mg/dL (ref 8.5–10.2)
CHLORIDE: 100 mmol/L (ref 98–107)
CREATININE: 0.8 mg/dL (ref 0.70–1.30)
EGFR CKD-EPI NON-AA MALE: >90 mL/min/{1.73_m2} (ref >=60)
GLUCOSE RANDOM: 144 mg/dL (ref 65–179)
POTASSIUM: 4.3 mmol/L (ref 3.5–5.0)
SODIUM: 137 mmol/L (ref 135–145)

## 2018-08-22 LAB — CBC
HEMATOCRIT: 33.7 % — ABNORMAL LOW (ref 41.0–53.0)
HEMOGLOBIN: 10.5 g/dL — ABNORMAL LOW (ref 13.5–17.5)
MEAN CORPUSCULAR HEMOGLOBIN CONC: 31 g/dL (ref 31.0–37.0)
MEAN CORPUSCULAR HEMOGLOBIN: 23.6 pg — ABNORMAL LOW (ref 26.0–34.0)
MEAN CORPUSCULAR VOLUME: 76.2 fL — ABNORMAL LOW (ref 80.0–100.0)
PLATELET COUNT: 259 10*9/L (ref 150–440)
RED BLOOD CELL COUNT: 4.43 10*12/L — ABNORMAL LOW (ref 4.50–5.90)
RED CELL DISTRIBUTION WIDTH: 17.8 % — ABNORMAL HIGH (ref 12.0–15.0)
WBC ADJUSTED: 12.3 10*9/L — ABNORMAL HIGH (ref 4.5–11.0)

## 2018-08-22 LAB — PLATELET COUNT: Lab: 259

## 2018-08-22 LAB — GLUCOSE RANDOM: Glucose:MCnc:Pt:Ser/Plas:Qn:: 144

## 2018-08-22 NOTE — Unmapped (Signed)
Pt alert and oriented with no complaints of pain. VSS. Pt on trilogy vent, resting well. Safety precautions maintained. Will continue to monitor.      Problem: Adult Inpatient Plan of Care  Goal: Plan of Care Review  Outcome: Progressing  Goal: Patient-Specific Goal (Individualization)  Outcome: Progressing  Goal: Absence of Hospital-Acquired Illness or Injury  Outcome: Progressing  Goal: Optimal Comfort and Wellbeing  Outcome: Progressing  Goal: Readiness for Transition of Care  Outcome: Progressing  Goal: Rounds/Family Conference  Outcome: Progressing     Problem: Venous Thromboembolism  Goal: VTE (Venous Thromboembolism) Symptom Resolution  Outcome: Progressing     Problem: Fall Injury Risk  Goal: Absence of Fall and Fall-Related Injury  Outcome: Progressing

## 2018-08-22 NOTE — Unmapped (Signed)
Patient with noted stable airway requiring .28 FI02 via trach collar. Patient compliant with all respiratory treatments and prefers to suction himself when needed. Patient goes on vent qhs. Plan is to continue with current care plan and monitor.

## 2018-08-22 NOTE — Unmapped (Signed)
Medicine G Pulmonology Daily Progress Note    24h events/subjective: Overnight, no acute events. He is still requiring trach collar during the day to maintain saturations, where at home he would normally move about on room air. He feels he is somewhat better over the last 24h, no fevers.    Assessment/Plan:    Principal Problem:    Fever  Active Problems:    Slow transit constipation    Hypothyroidism    Gastrostomy status (CMS-HCC)    Chronic tracheobronchitis (CMS-HCC)    Tracheostomy dependence (CMS-HCC)    VATER syndrome    PEG (percutaneous endoscopic gastrostomy) status (CMS-HCC)    History of home ventilator (CMS-HCC)    Bipolar 1 disorder (CMS-HCC)    Central sleep apnea  Resolved Problems:    * No resolved hospital problems. *        @LDAPRESSUREULCER @     Coulton Schlink is a 27 y.o. male with VATER syndrome c/b tracheobronchomalacia s/p trach/PEG, nocturnal vent dependence due to central sleep apnea, frequent admissions for tracheobronchitis, and esophageal stenosis, who presented to [facility] after [facility] noted O2 sats 85-89% and fever to 102F, 9 hours after discharge from [facility] where he has treated for S. maltophilia tracheobronchitis with IV zosyn then PO bactrim. Upon further workup, he was found to be SIRS positive with new CXR infiltrates and leukocytosis. This is concerning for persistent worsening of prior infection versus a new acute pulmonary process.     #Pneumonitis    #Stenotrophomonas tracheitis/bronchitis & Rhinovirus infection  Discharged from [facility] 9 hours prior to presentation. Per chart review, the patient was febrile to 101.7 F overnight before discharge, but had otherwise clinically improved with antibiotics. Now with new bilateral patchy CXR infiltrates and hypoxia. The differential includes aspiration pneumonitis given history of esophageal stenosis recently dilated & PEG tube, vs ventilator-associated pneumonia vs persistent tracheobronchitis or viral pneumonia given CXR picture and + Rhinovirus. This is unlikely volume overload given normal echo 11/2017. Per chart review of notes from his pulmonologist, recurrent aspiration has been a concern. Other sources of infection are unlikely given UA normal, and no new skin lesions.  Respiratory culture showed normal flora on 08/21/2018.  - We will discontinue TMP-SMX and Vanc/Cefepime as BCx have been negative for 48h  - We will start a 5 day course of Augmentin  - IV Lasix 20mg  x1, as he may have some component of pulmonary edema 2/2 aspiration pneumonitis.    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   #frequent tracheobronchitis admissions   - Continue aformoterol nebs BID   - Continue budesonide nebs BID  - Continue TOBI nebs (hx chronic PsA)  - airway clearance: must use cough assist with oscillation & Duonebs prior to 3% HTS  - continue azithromycin MWF   - continue usual home Trilogy vent settings at night (AVAPS, VT 525 ml, PEEP 10 cm H2O)  - nutrition assistance with tube feeds     #Type 2 achalasia/ Esophageal stenosis  Balloon dilation of 2 esophageal stenoses 08/06/2018 during previous admission.   ??  #Bipolar 1 disorder??-??PTSD - Anxiety.????  He has followed with [facility] and undergone psychotherapy previously.??   - Continue home citalopram, valproate, mirtazapine, lorazepam 0.5 mg PO BID prn  ??  #Microcytic anemia  at baseline  ??  #Hypothyroidism  continue home synthroid 75 mcg daily  ??  #Slow transit constipation  continue bentyl QID prn  ??  #Lower extremity muscle spasms  Continue cyclobenzaprine prn  ??  #  Acne  Restart minocycline as vanc now done.  ??  #Social  His Medicaid apparently lapsed and resulted in his inability to have necessary at-home nursing care at night for his Trilogy ventilator.??This was apparently resolved during his previous hospitalization. Will confirm with CM during the week that this is still resolved.    #DVT ppx: lovenox 40mg  daily    #Code status: full code  ___________________________________________________________________    Recent Results (from the past 24 hour(s))   Basic Metabolic Panel    Collection Time: 08/22/18  5:22 AM   Result Value Ref Range    Sodium 137 135 - 145 mmol/L    Potassium 4.3 3.5 - 5.0 mmol/L    Chloride 100 98 - 107 mmol/L    CO2 27.0 22.0 - 30.0 mmol/L    Anion Gap 10 9 - 15 mmol/L    BUN 10 7 - 21 mg/dL    Creatinine 1.61 0.96 - 1.30 mg/dL    BUN/Creatinine Ratio 13     EGFR CKD-EPI Non-African American, Male >90 >=60 mL/min/1.33m2    EGFR CKD-EPI African American, Male >90 >=60 mL/min/1.78m2    Glucose 144 65 - 179 mg/dL    Calcium 8.8 8.5 - 04.5 mg/dL   CBC    Collection Time: 08/22/18  5:22 AM   Result Value Ref Range    WBC 12.3 (H) 4.5 - 11.0 10*9/L    RBC 4.43 (L) 4.50 - 5.90 10*12/L    HGB 10.5 (L) 13.5 - 17.5 g/dL    HCT 40.9 (L) 81.1 - 53.0 %    MCV 76.2 (L) 80.0 - 100.0 fL    MCH 23.6 (L) 26.0 - 34.0 pg    MCHC 31.0 31.0 - 37.0 g/dL    RDW 91.4 (H) 78.2 - 15.0 %    MPV 7.8 7.0 - 10.0 fL    Platelet 259 150 - 440 10*9/L     Labs/Studies:  Labs and Studies from the last 24hrs per EMR and Reviewed    Objective:  Temp:  [36.6 ??C-37.1 ??C] 36.6 ??C  Heart Rate:  [83-100] 86  SpO2 Pulse:  [86-91] 87  Resp:  [14-25] 14  BP: (113-136)/(51-78) 113/51  FiO2 (%):  [28 %-35 %] 35 %  SpO2:  [98 %-100 %] 98 %    General: asleep but arousable, lying in bed in no acute distress  HENT: tracheostomy in place with trach collar present and on the vent  Lungs: no rales, rhonchi or wheezes, synchronous with device, no accessory muscle use.   Cardiac: Regular rate and rhythm, no murmurs, gallops or rubs  Abdomen: soft, non-tender, bowel sounds are normal, G tube present  Skin: warm, dry, intact    Harland Dingwall, MD  PGY-1 Internal Medicine Resident

## 2018-08-23 NOTE — Unmapped (Signed)
Pt A&O x4. VSS. RA with passy-muir valve during day and vent at night. No complaints of pain. Complaint of nausea x2, phenergan given x2 with reported relief. SQ lovenox ordered for VTE prophylaxis. Pt ambulatory and can turn self in bed. No BM this shift. Voiding without issue. Pt to discharge on 9/17 pending okay oxygenation overnight. Standard precautions maintained. Bed low, call bell and bedside table within reach. Will continue to monitor.     Problem: Adult Inpatient Plan of Care  Goal: Plan of Care Review  Outcome: Progressing  Goal: Patient-Specific Goal (Individualization)  Outcome: Progressing  Goal: Absence of Hospital-Acquired Illness or Injury  Outcome: Progressing  Goal: Optimal Comfort and Wellbeing  Outcome: Progressing  Goal: Readiness for Transition of Care  Outcome: Progressing  Goal: Rounds/Family Conference  Outcome: Progressing     Problem: Venous Thromboembolism  Goal: VTE (Venous Thromboembolism) Symptom Resolution  Outcome: Progressing     Problem: Fall Injury Risk  Goal: Absence of Fall and Fall-Related Injury  Outcome: Progressing     Problem: Communication Impairment (Mechanical Ventilation, Invasive)  Goal: Effective Communication  Outcome: Progressing     Problem: Ventilator-Induced Lung Injury (Mechanical Ventilation, Invasive)  Goal: Absence of Ventilator-Induced Lung Injury  Outcome: Progressing

## 2018-08-23 NOTE — Unmapped (Signed)
Pt had uneventful shift. Continues on qshift assessment/VS. Took all meds through [facility] button without issue. Received prn phenergan x 1 and two doses of IV toradol; this worked well for the pt. Pt to d/c home today. No acute events. No falls/injuries. Wctm.   Problem: Adult Inpatient Plan of Care  Goal: Plan of Care Review  Outcome: Progressing  Goal: Patient-Specific Goal (Individualization)  Outcome: Progressing  Goal: Absence of Hospital-Acquired Illness or Injury  Outcome: Progressing  Goal: Optimal Comfort and Wellbeing  Outcome: Progressing  Goal: Readiness for Transition of Care  Outcome: Progressing  Goal: Rounds/Family Conference  Outcome: Progressing     Problem: Venous Thromboembolism  Goal: VTE (Venous Thromboembolism) Symptom Resolution  Outcome: Progressing     Problem: Fall Injury Risk  Goal: Absence of Fall and Fall-Related Injury  Outcome: Progressing     Problem: Communication Impairment (Mechanical Ventilation, Invasive)  Goal: Effective Communication  Outcome: Progressing     Problem: Ventilator-Induced Lung Injury (Mechanical Ventilation, Invasive)  Goal: Absence of Ventilator-Induced Lung Injury  Outcome: Progressing

## 2018-08-23 NOTE — Unmapped (Signed)
Medicine G Pulmonology Daily Progress Note    24h events/subjective: Overnight, FiO2 was increased from 28% to 35%. Unclear per RT why there was an increase. He feels subjectively improved. He had a bowel movement yesterday. His trach has been capped today and the patient is ambulatory around the unit. Patient is aware of plan to d/c tomorrow if stable on home vent settings overnight.     Assessment/Plan:    Principal Problem:    Fever  Active Problems:    Slow transit constipation    Hypothyroidism    Gastrostomy status (CMS-HCC)    Chronic tracheobronchitis (CMS-HCC)    Tracheostomy dependence (CMS-HCC)    VATER syndrome    PEG (percutaneous endoscopic gastrostomy) status (CMS-HCC)    History of home ventilator (CMS-HCC)    Bipolar 1 disorder (CMS-HCC)    Central sleep apnea  Resolved Problems:    * No resolved hospital problems. *             Cameron Baker is a 27 y.o. male with VATER syndrome c/b tracheobronchomalacia s/p trach/PEG, nocturnal vent dependence due to central sleep apnea, frequent admissions for tracheobronchitis, and esophageal stenosis, who presented to [facility] after [facility] noted O2 sats 85-89% and fever to 102F, 9 hours after discharge from [facility] where he has treated for S. maltophilia tracheobronchitis with IV zosyn then PO bactrim. Upon further workup, he was found to be SIRS positive with new CXR infiltrates and leukocytosis. This is concerning for persistent worsening of prior infection versus a new acute pulmonary process.     #Pneumonitis    #Stenotrophomonas tracheitis/bronchitis & Rhinovirus infection  Was noted to have FiO2 increased overnight. Has been ambulatory about the unit with his trach capped, which is an objective improvement from yesterday when he was requiring trach collar. Patient had an adequate response to lasix with objective improvement as evidenced by his improved O2 requirements. He finished 3d course of cefepime/vancomycin (9/13-9/15) and started augmentin 9/15.  - continue augmentin; will complete a 14 day total course of antibiotics (end date 09/03/2018)  - day #2 levaquin - will d/c due to low concern for aspiration pneumonia   - continuous pulse oximetry tonight at home vent settings (AVAPS, VT 525 ml, PEEP 10 cm H2O) so we can assess if this is adequate for him at home.    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   #frequent tracheobronchitis admissions   - Continue aformoterol nebs BID   - Continue budesonide nebs BID  - Continue TOBI nebs (hx chronic PsA)  - airway clearance: must use cough assist with oscillation & Duonebs prior to 3% HTS  - continue azithromycin MWF   - continue usual home Trilogy vent settings at night (AVAPS, VT 525 ml, PEEP 10 cm H2O)  - nutrition assistance with tube feeds     #Type 2 achalasia/ Esophageal stenosis  Balloon dilation of 2 esophageal stenoses 08/06/2018 during previous admission.   ??  #Bipolar 1 disorder??-??PTSD - Anxiety.????  He has followed with [facility] and undergone psychotherapy previously.??   - Continue home citalopram, valproate, mirtazapine, lorazepam 0.5 mg PO BID prn  ??  #Microcytic anemia  at baseline  ??  #Hypothyroidism  continue home synthroid 75 mcg daily  ??  #Slow transit constipation  continue bentyl QID prn  ??  #Lower extremity muscle spasms  Continue cyclobenzaprine prn  ??  #Acne   Continue minocycline     #Social  His Medicaid apparently lapsed and resulted  in his inability to have necessary at-home nursing care at night for his Trilogy ventilator.??This was apparently resolved during his previous hospitalization.   - CM confirmed that the patient's [facility] services are still active and the Trilogy vent is available at home    #DVT ppx: lovenox 40mg  daily    #Code status: full code  ___________________________________________________________________    Labs/Studies:  Labs and Studies from the last 24hrs per EMR and Reviewed    Objective:  Temp:  [36.9 ??C (98.4 ??F)] 36.9 ??C (98.4 ??F)  Heart Rate:  [108-110] 108  SpO2 Pulse:  [109] 109  Resp:  [16-17] 16  BP: (140)/(76) 140/76  FiO2 (%):  [28 %-35 %] 35 %  SpO2:  [94 %-99 %] 94 %    General: asleep but arousable, lying in bed in no acute distress  HENT: tracheostomy in place with trach collar present and on the vent  Lungs: breathing comfortably. Course breath sounds bilaterally.   Cardiac: Regular rate and rhythm, no murmurs, gallops or rubs  Abdomen: soft, non-tender, bowel sounds are normal, G tube present  Skin: warm, dry, intact    Davene Costain MS-IV     I attest that I have reviewed the student note and that the components of the history of the present illness, the physical exam, and the assessment and plan documented were performed by me or were performed in my presence by the student where I verified the documentation and performed (or re-performed) the exam and medical decision making.       Harland Dingwall, MD  PGY-1 Internal Medicine Resident

## 2018-08-23 NOTE — Unmapped (Signed)
Pt is A&Ox4, VSS, O2 sats >90% on RA but on vent at 28%. Afebrile. c/o of pain in L chest. UO adequate, no BM this shift. Pt's appetite has been adequate. Skin lesion on L hand, Q2H turns and standard precautions maintained. No falls/injuries this shift. On Lovenox for DVT prophylaxis. Went off the floor to the gift shop and back. All monitors with appropriate alarm settings, will continue to monitor patient.       Problem: Adult Inpatient Plan of Care  Goal: Plan of Care Review  Outcome: Progressing  Goal: Patient-Specific Goal (Individualization)  Outcome: Progressing  Goal: Absence of Hospital-Acquired Illness or Injury  Outcome: Progressing  Goal: Optimal Comfort and Wellbeing  Outcome: Progressing  Goal: Readiness for Transition of Care  Outcome: Progressing  Goal: Rounds/Family Conference  Outcome: Progressing     Problem: Venous Thromboembolism  Goal: VTE (Venous Thromboembolism) Symptom Resolution  Outcome: Progressing     Problem: Fall Injury Risk  Goal: Absence of Fall and Fall-Related Injury  Outcome: Progressing

## 2018-08-23 NOTE — Unmapped (Signed)
Problem: Communication Impairment (Mechanical Ventilation, Invasive)  Goal: Effective Communication  Outcome: Progressing     Problem: Ventilator-Induced Lung Injury (Mechanical Ventilation, Invasive)  Goal: Absence of Ventilator-Induced Lung Injury  Outcome: Progressing   Pt remain on vent with no changes, pt resting, tol tx well,

## 2018-08-23 NOTE — Unmapped (Addendum)
Per HPI by Dr. Kennedy Bucker:  Cameron Baker is a 27 y.o. male with PMHx as reviewed in the EMR that presented to [facility] with Fever.  ??  He was recently admitted to [facility] from 8/21-9/12. Initially was admitted for potential left arm cellulitis. However, he developed fever and worsening productive cough while in the hospital, and his sputum grew Stenotrophomonas. He also tested positive for rhinovirus. He was empirically treated with IV zosyn but later transitioned to PO bactrim. He seems to have improved quickly with this treatment and was able to be discharged; however, it was not meant to be. Soon after leaving the hospital, he began feeling poorly. He endorses fever as high as 103, worsening sputum production, and O2 desaturation into the mid 80s. He spoke with his pulmonologist, Dr. Stasia Cavalier, who recommended that he come to the ED to be evaluated.   ??  In the ED, he was afebrile but was tachycardic and tachypneic. Labs were remarkable for leukocytosis of 14 (up from 12 earlier in the day when he was discharged). He received some IV fluids, broad spectrum abx, and was admitted to the medicine team for further management.    #Pneumonitis    #Stenotrophomonas tracheitis/bronchitis & Rhinovirus infection  The patient presented with hypoxia and fevers s/p discharge from [facility]. He was found to have new bilateral CXR infiltrates and leukocytosis. Given recent admission with esophageal stenosis dilation the differential included aspiration pneumonitis vs persistent tracheobronchitis vs ventilator-associated pneumonia vs volume overload. TMP-SMX from the prior hospitalization was continued and empiric IV Vanc/Cefepime was started (9/13) while awaiting sputum and blood cultures. Antibiotics were narrowed to augmentin after 48 hours with sputum Cx growing OP flora. Given persistent shortness of breath as evidenced by use of trach collar, diuresis was trialed due to concern for pressure pneumonitis secondary to aspiration pneumonitis. The patient seemed to have improved by 9/16, however the patient was kept overnight because he was still not back to his home vent settings (FiO2 28-35% versus at home 21%). The patient was stable overnight without desaturations on home vent settings.   - continue augmentin (end date 09/02/2018)    #VATER syndrome-associated tracheobronchomalacia s/p trach/PEG and nocturnal mechanical ventilation.   #frequent tracheobronchitis admissions   - continue budesonide & aformoterol nebs BID at home. Still uses symbicort as a substitute when out of the house  - takes tobi nebs q every other month. (hx chronic PsA) Currently on an off month since 9/14. Will need to restart 10/11 per primary pulmonologist   - continue AC - cough assist with oscillation & Duonebs prior to 3% HTS  - continue azithromycin MWF   - continue usual home Trilogy vent settings at night (AVAPS, VT 525 ml, PEEP 10 cm H2O)  ??  #Type 2 achalasia/ Esophageal stenosis  Balloon dilation of 2 esophageal stenoses 08/06/2018 during previous admission. New infiltrates are concerning for aspiration. Stable.  ??  #Social  His Medicaid apparently lapsed and resulted in his inability to have necessary at-home nursing care at night for his Trilogy ventilator.??This was apparently resolved during his previous hospitalization.   - CM confirmed that the patient's [facility] services are still active and the Trilogy vent is available at home    Other Chronic Problems  #Microcytic anemia: at baseline  #Hypothyroidism: continue home synthroid 75 mcg daily  #Slow transit constipation: continue??linaclotide, continue bentyl QID prn  #Lower extremity muscle spasms: Continue cyclobenzaprine prn  #Acne: continue minocycline  #Bipolar 1 disorder??-??PTSD - Anxiety: follows w/ [facility]  Psychiatry; continue home regimen (citalopram, valproate, mirtazapine, lorazepam 0.5 mg PO BID prn)

## 2018-08-24 LAB — BASIC METABOLIC PANEL
ANION GAP: 9 mmol/L (ref 9–15)
BLOOD UREA NITROGEN: 10 mg/dL (ref 7–21)
BUN / CREAT RATIO: 13
CALCIUM: 9.7 mg/dL (ref 8.5–10.2)
CHLORIDE: 99 mmol/L (ref 98–107)
CO2: 28 mmol/L (ref 22.0–30.0)
EGFR CKD-EPI AA MALE: >90 mL/min/{1.73_m2} (ref >=60)
EGFR CKD-EPI NON-AA MALE: >90 mL/min/{1.73_m2} (ref >=60)
GLUCOSE RANDOM: 165 mg/dL (ref 65–179)
POTASSIUM: 4.9 mmol/L (ref 3.5–5.0)
SODIUM: 136 mmol/L (ref 135–145)

## 2018-08-24 LAB — CBC
HEMATOCRIT: 37.1 % — ABNORMAL LOW (ref 41.0–53.0)
HEMOGLOBIN: 11.3 g/dL — ABNORMAL LOW (ref 13.5–17.5)
MEAN CORPUSCULAR HEMOGLOBIN CONC: 30.4 g/dL — ABNORMAL LOW (ref 31.0–37.0)
MEAN CORPUSCULAR HEMOGLOBIN: 23.4 pg — ABNORMAL LOW (ref 26.0–34.0)
MEAN CORPUSCULAR VOLUME: 77 fL — ABNORMAL LOW (ref 80.0–100.0)
MEAN PLATELET VOLUME: 7.8 fL (ref 7.0–10.0)
PLATELET COUNT: 381 10*9/L (ref 150–440)
RED CELL DISTRIBUTION WIDTH: 18 % — ABNORMAL HIGH (ref 12.0–15.0)

## 2018-08-24 LAB — ANION GAP: Anion gap 3:SCnc:Pt:Ser/Plas:Qn:: 9

## 2018-08-24 LAB — MEAN PLATELET VOLUME: Lab: 7.8

## 2018-08-24 MED ORDER — AMOXICILLIN 875 MG-POTASSIUM CLAVULANATE 125 MG TABLET
ORAL_TABLET | Freq: Two times a day (BID) | ORAL | 0 refills | 0 days | Status: CP
Start: 2018-08-24 — End: 2018-09-02

## 2018-08-24 NOTE — Unmapped (Signed)
A&Ox4. Slept most of the morning. 21% vent while sleeping till late morning, then RA. VSS. No c/o pain. Using speaking valve. Received PRN phenergan prior to discharge. Adequate UO. No BM this shift. Walked around the unit several time. Free from falls/injury so far this shift. ROUNDS q2hrs. Pt discharged to home this afternoon.      Problem: Adult Inpatient Plan of Care  Goal: Plan of Care Review  Outcome: Discharged to Home  Goal: Patient-Specific Goal (Individualization)  Outcome: Discharged to Home  Goal: Absence of Hospital-Acquired Illness or Injury  Outcome: Discharged to Home  Goal: Optimal Comfort and Wellbeing  Outcome: Discharged to Home  Goal: Readiness for Transition of Care  Outcome: Discharged to Home  Goal: Rounds/Family Conference  Outcome: Discharged to Home     Problem: Venous Thromboembolism  Goal: VTE (Venous Thromboembolism) Symptom Resolution  Outcome: Discharged to Home     Problem: Fall Injury Risk  Goal: Absence of Fall and Fall-Related Injury  Outcome: Discharged to Home     Problem: Communication Impairment (Mechanical Ventilation, Invasive)  Goal: Effective Communication  Outcome: Discharged to Home     Problem: Ventilator-Induced Lung Injury (Mechanical Ventilation, Invasive)  Goal: Absence of Ventilator-Induced Lung Injury  Outcome: Discharged to Home

## 2018-08-24 NOTE — Unmapped (Signed)
Pt remains stable. VS - BPs WNL, NSR to ST, afebrile. Maintaining saturations > 95% on RA and 21% Trilogy at HS. No episodes of desaturations. No c/o pain this shift. Pt requested SMOG enema this shift - had one BM prior to request, pt stated that he does SMOGs at home and he felt like some was still up there and wouldn't come out - MD paged. Ordered soap suds enema. Pt refused this, stating he wanted tap water enema. MD made aware, order placed. Pt received tap water enema with good relief noted. Some nausea this shift, given prn phenergan x2. Urinating adequately independently. Ambulatory around and off the unit this shift. Pt anxious to d/c today. No acute events. No falls/injuries sustained. Wctm.   Problem: Adult Inpatient Plan of Care  Goal: Plan of Care Review  Outcome: Progressing  Goal: Patient-Specific Goal (Individualization)  Outcome: Progressing  Goal: Absence of Hospital-Acquired Illness or Injury  Outcome: Progressing  Goal: Optimal Comfort and Wellbeing  Outcome: Progressing  Goal: Readiness for Transition of Care  Outcome: Progressing  Goal: Rounds/Family Conference  Outcome: Progressing     Problem: Venous Thromboembolism  Goal: VTE (Venous Thromboembolism) Symptom Resolution  Outcome: Progressing     Problem: Fall Injury Risk  Goal: Absence of Fall and Fall-Related Injury  Outcome: Progressing     Problem: Communication Impairment (Mechanical Ventilation, Invasive)  Goal: Effective Communication  Outcome: Progressing     Problem: Ventilator-Induced Lung Injury (Mechanical Ventilation, Invasive)  Goal: Absence of Ventilator-Induced Lung Injury  Outcome: Progressing

## 2018-08-24 NOTE — Unmapped (Signed)
Pt received flu vaccine prior to discharging. Port heparin locked and deaccessed. Discharge instructions reviewed with patient. All questions answered. Pt discharged in the afternoon - pt said he had a ride home and did not need a taxi voucher. All belongings with patient.

## 2018-08-24 NOTE — Unmapped (Signed)
Patient stated he takes Tobramycin every other month and his month ended Saturday. Patient was compliant with all other inhaled treatments this shift. Will continue to monitor.

## 2018-08-24 NOTE — Unmapped (Signed)
Physician Discharge Summary [facility]  [facility]  [address]  [place] [address]  Dept: [phone]  Loc: [phone]     Identifying Information:   Cameron Baker  Nov 08, 1991  [id number]    Primary Care Physician: [name], MD   Code Status: Full Code    Admit Date: 08/19/2018    Discharge Date: 08/24/2018     Discharge To: Home with Home Health and/or PT/OT    Discharge Service: MDG - Pulmonary Admit     Discharge Attending Physician: Reggie Pile, MD    Discharge Diagnoses:  Principal Problem:    Fever  Active Problems:    Slow transit constipation    Hypothyroidism    Gastrostomy status (CMS-HCC)    Chronic tracheobronchitis (CMS-HCC)    Tracheostomy dependence (CMS-HCC)    VATER syndrome    PEG (percutaneous endoscopic gastrostomy) status (CMS-HCC)    History of home ventilator (CMS-HCC)    Bipolar 1 disorder (CMS-HCC)    Central sleep apnea  Resolved Problems:    * No resolved hospital problems. *      Outpatient Provider Follow Up Issues:   1) Continue augmentin 875-125 mg tabs po q12 hr for 2 week course (last dose 09/02/2018)    Hospital Course:   Per HPI by Dr. Kennedy Bucker:  Cameron Baker is a 27 y.o. male with PMHx as reviewed in the EMR that presented to [facility] with Fever. He was recently admitted to [facility] from 8/21-9/12. Initially was admitted for potential left arm cellulitis. However, he developed fever and worsening productive cough while in the hospital, and his sputum grew Stenotrophomonas. He also tested positive for rhinovirus. He was empirically treated with IV zosyn but later transitioned to PO bactrim. He seems to have improved quickly with this treatment and was able to be discharged; however, it was not meant to be. Soon after leaving the hospital, he began feeling poorly. He endorses fever as high as 103, worsening sputum production, and O2 desaturation into the mid 80s. He spoke with his pulmonologist, Dr. Stasia Cavalier, who recommended that he come to the ED to be evaluated. In the ED, he was afebrile but was tachycardic and tachypneic. Labs were remarkable for leukocytosis of 14 (up from 12 earlier in the day when he was discharged). He received some IV fluids, broad spectrum abx, and was admitted to the medicine team for further management. He met criteria for SIRS with a known source of infection in the lung.    #SIRS with known source of infection  #Aspiration Pneumonitis    #Stenotrophomonas tracheitis/bronchitis & Rhinovirus infection  The patient presented with tachycardia, tachypnea, hypoxia and fevers with a mild increase in his WBC from 12 to 14 s/p discharge from [facility]. He was found to have new bilateral CXR infiltrates and leukocytosis. Given recent admission with esophageal stenosis dilation the differential included aspiration pneumonitis vs persistent tracheobronchitis vs ventilator-associated pneumonia vs volume overload. TMP-SMX from the prior hospitalization was continued and empiric IV Vanc/Cefepime was started (9/13) while awaiting sputum and blood cultures. Antibiotics were narrowed to augmentin after 48 hours with sputum Cx growing OP flora. Given persistent shortness of breath as evidenced by use of trach collar, diuresis was trialed due to concern for pressure pneumonitis secondary to aspiration pneumonitis. The patient seemed to have improved by 9/16, however the patient was kept overnight because he was still not back to his home vent settings (FiO2 28-35% versus at home 21%). The night before discharge, the  patient was maintained on home vent settings the whole night without any desaturations. He was discharged with a course of augmentin (end date 09/02/2018) for total duration of antibiotics 14 days.    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   #frequent tracheobronchitis admissions   We continued his home management of these issues. We did his home aformoterol and budesonide nebs BID, finished his month on for tobi nebs, did airway clearance with cough assist with oscillation and Duonebs prior to 3% HTS, used his home vent settings at night and he was on azithromycin MWF. We discharged him on this same regimen.  ??  #Type 2 achalasia/ Esophageal stenosis  Balloon dilation of 2 esophageal stenoses 08/06/2018 during previous admission. His new infiltrates were concerning for aspiration. No further aspiration events noted during his hospitalization.  ??  #Social  His Medicaid apparently lapsed and resulted in his inability to have necessary at-home nursing care at night for his Trilogy ventilator which is what brought him in for his [facility] hospitalization.??This was apparently resolved during his previous hospitalization and we confirmed that the patient's [facility] services are still active and the Trilogy vent is available at home    Other Chronic Problems  #Microcytic anemia: at baseline, no in hospital interventions.  #Hypothyroidism: continued on home synthroid 75 mcg daily  #Slow transit constipation: continued on??bentyl QID prn and required miralax and one enema to maintain bowel movements inpatient. Discharged on home bentyl and Linzess.  #Lower extremity muscle spasms: Continued on home cyclobenzaprine prn  #Acne: Held home minocycline while on IV vancomycin and then when de-escalated, we continued minocycline on discharge.  #Bipolar 1 disorder??-??PTSD - Anxiety: follows w/ [facility]; continued on home regimen (citalopram, valproate, mirtazapine, lorazepam 0.5 mg PO BID prn)  ??  Procedures:  No admission procedures for hospital encounter.  ______________________________________________________________________  Discharge Medications:     Your Medication List      STOP taking these medications    sulfamethoxazole-trimethoprim 200-40 mg/5 mL suspension  Commonly known as:  BACTRIM,SEPTRA        START taking these medications    amoxicillin-clavulanate 875-125 mg per tablet  Commonly known as: AUGMENTIN  Take 1 tablet by mouth every twelve (12) hours. for 9 days        CONTINUE taking these medications    acetaminophen 325 MG tablet  Commonly known as:  TYLENOL  Take 650 mg by mouth every six (6) hours as needed for pain.     albuterol 90 mcg/actuation inhaler  Commonly known as:  PROVENTIL HFA;VENTOLIN HFA  Inhale 2 puffs every six (6) hours as needed for wheezing.     arformoterol 15 mcg/2 mL  Commonly known as:  BROVANA  Inhale 2 mL (15 mcg total) by nebulization Two (2) times a day.     azithromycin 500 MG tablet  Commonly known as:  ZITHROMAX  1 tablet (500 mg total) by G-tube route 3 (three) times a week.     budesonide 0.5 mg/2 mL nebulizer solution  Commonly known as:  PULMICORT  Inhale 4 mL (1 mg total) by nebulization Two (2) times a day.     budesonide-formoterol 160-4.5 mcg/actuation inhaler  Commonly known as:  SYMBICORT  Inhale 2 puffs Two (2) times a day.     cetirizine 10 MG tablet  Commonly known as:  ZyrTEC  1 tablet (10 mg total) by G-tube route every evening.     cholecalciferol (vitamin D3) 2,000 unit Cap  3 capsules (  6,000 Units total) by G-tube route daily.     citalopram 40 MG tablet  Commonly known as:  CeleXA  1 tablet (40 mg total) by G-tube route nightly.     clindamycin 1 % gel  Commonly known as:  CLINDAGEL  Apply topically Two (2) times a day.     cyclobenzaprine 5 MG tablet  Commonly known as:  FLEXERIL  Take 1 tablet (5 mg total) by mouth Three (3) times a day as needed for muscle spasms.     diclofenac sodium 1 % gel  Commonly known as:  VOLTAREN  Apply 2 g topically 4 (four) times a day as needed.     dicyclomine 10 mg capsule  Commonly known as:  BENTYL  10 mg by G-tube route 4 (four) times a day as needed.     fluticasone propionate 50 mcg/actuation nasal spray  Commonly known as:  FLONASE  2 sprays by Each Nare route daily.     gabapentin 300 MG capsule  Commonly known as:  NEURONTIN  1 capsule (300 mg total) by G-tube route Three (3) times a day.     ibuprofen 600 MG tablet  Commonly known as:  ADVIL,MOTRIN  1 tablet (600 mg total) by G-tube route every six (6) hours as needed for pain.     ipratropium-albuterol 0.5-2.5 mg/3 mL nebulizer  Commonly known as:  DUO-NEB  Inhale 3 mL 4 (four) times a day.     levothyroxine 75 MCG tablet  Commonly known as:  SYNTHROID, LEVOTHROID  1 tablet (75 mcg total) by G-tube route daily.     lidocaine-prilocaine 2.5-2.5 % cream  Commonly known as:  EMLA  APPLY TOPICALLY DAILY AS NEEDED FOR PORT ACCESS     linaCLOtide 290 mcg capsule  Commonly known as:  LINZESS  1 capsule (290 mcg total) by G-tube route daily.     LORazepam 0.5 MG tablet  Commonly known as:  ATIVAN  Take 1 tablet (0.5mg ) twice daily and 1 tablet (0.5mg ) daily as needed for anxiety via G-tube     minocycline 100 MG capsule  Commonly known as:  MINOCIN,DYNACIN  1 capsule (100 mg total) by G-tube route nightly.     mirtazapine 15 MG tablet  Commonly known as:  REMERON  1 tablet (15 mg total) by G-tube route nightly.     montelukast 10 mg tablet  Commonly known as:  SINGULAIR  Take 1 tablet (10 mg total) by mouth nightly.     nebulizers Misc  Provide 1 device  for use with inhaled medication.     NORMAL SALINE FLUSH 0.9 % injection  Generic drug:  sodium chloride  INFUSE INTO VENOUS CATHETER 4 TIMES DAILY AS NEEDED (PORT FLUSH)     omeprazole 20 MG capsule  Commonly known as:  PriLOSEC  20 mg Two (2) times a day (30 minutes before a meal). Via G-tube     sodium chloride 0.9 % Soln  Commonly known as:  NS  Infuse 10 mL into a venous catheter 4 (four) times a day as needed (port flush).     sodium chloride 3 % nebulizer solution  Inhale 4 mL by nebulization 4 (four) times a day.     tobramycin (PF) 300 mg/5 mL nebulizer solution  Commonly known as:  TOBI  INHALE (300MG ) VIA NEBULIZER EVERY TWELVE HOURS EVERY OTHER MONTH     triamcinolone 0.1 % cream  Commonly known as:  KENALOG  Apply topically Two (2) times a  day.     valproate 250 mg/5 mL syrup  Commonly known as:  DEPAKENE Take 5ml (250mg ) by G-tube every morning and 15ml (750mg ) every evening            Allergies:  Xopenex [levalbuterol hcl]; Allopurinol analogues; and Versed [midazolam]  ______________________________________________________________________  Pending Test Results (if blank, then none):   Order Current Status    Blood Culture Preliminary result    Blood Culture Preliminary result          Most Recent Labs:  All lab results last 24 hours -   Recent Results (from the past 24 hour(s))   CBC    Collection Time: 08/24/18  5:01 AM   Result Value Ref Range    WBC 9.5 4.5 - 11.0 10*9/L    RBC 4.82 4.50 - 5.90 10*12/L    HGB 11.3 (L) 13.5 - 17.5 g/dL    HCT 16.1 (L) 09.6 - 53.0 %    MCV 77.0 (L) 80.0 - 100.0 fL    MCH 23.4 (L) 26.0 - 34.0 pg    MCHC 30.4 (L) 31.0 - 37.0 g/dL    RDW 04.5 (H) 40.9 - 15.0 %    MPV 7.8 7.0 - 10.0 fL    Platelet 381 150 - 440 10*9/L   Basic Metabolic Panel    Collection Time: 08/24/18  5:01 AM   Result Value Ref Range    Sodium 136 135 - 145 mmol/L    Potassium 4.9 3.5 - 5.0 mmol/L    Chloride 99 98 - 107 mmol/L    CO2 28.0 22.0 - 30.0 mmol/L    Anion Gap 9 9 - 15 mmol/L    BUN 10 7 - 21 mg/dL    Creatinine 8.11 9.14 - 1.30 mg/dL    BUN/Creatinine Ratio 13     EGFR CKD-EPI Non-African American, Male >90 >=60 mL/min/1.79m2    EGFR CKD-EPI African American, Male >90 >=60 mL/min/1.11m2    Glucose 165 65 - 179 mg/dL    Calcium 9.7 8.5 - 78.2 mg/dL       Relevant Studies/Radiology (if blank, then none):    ______________________________________________________________________  Discharge Instructions:   Activity Instructions     Activity as tolerated                    Follow Up instructions and Outpatient Referrals     Ambulatory referral to Home Health      Disciplines requested:  Nursing    Nursing requested:   Oxygen per oxygen order Comment - Home vent monitoring  Other: (please enter in comments)       Physician to follow patient's care:  PCP    I certify that Cameron Baker is confined to his/her home and needs intermittent skilled nursing care, physical therapy and/or speech therapy or continues to need occupational therapy. The patient is under my care, and I have authorized services on this plan of care and will periodically review the plan. The patient had a face-to-face encounter with an allowed provider type on 08/24/18 and the encounter was related to the primary reason for home health care.     Please resume Private Duty Nursing per previous schedule.         Call MD for:  difficulty breathing, headache or visual disturbances      Call MD for:  extreme fatigue      Call MD for:  hives      Call MD for:  persistent  dizziness or light-headedness      Call MD for:  persistent nausea or vomiting      Call MD for:  redness, tenderness, or signs of infection (pain, swelling, redness, odor or green/yellow discharge around incision site)      Call MD for:  severe uncontrolled pain      Call MD for: Temperature > 38.5 Celsius ( > 101.3 Fahrenheit)      Discharge instructions      You were admitted to [facility] with fevers and desaturations. We treated you for an aspiration pneumonitis with antibiotics and diuretics. You got better quickly and are being sent home with antibiotics.    MEDICATION CHANGES:  - Please START taking Augmentin two times a day with the last day you will take it being 9/26.    Your discharge medications are also listed here as well. Please continue to take these medications as directed.     Seek medical attention if you develop fever, chest pain, unexplained/unrelieved shortness of breath, any changes in your mental status, significant abdominal pain, uncontrolled nausea and vomiting such that you cannot stay hydrated or take your medications, worsening swelling, or any other concerning symptoms.     FOLLOW-UP PLAN:  Please call to schedule an appointment to follow-up with your primary care doctor within the next 7-10 days.    Please continue to follow-up with your outpatient care providers. Some of your follow-up appointments have been listed below. Please be sure to attend these appointments at the dates and times indicated, and to bring all of your medications with you to help your provider better guide your care.               Appointments which have been scheduled for you    Aug 25, 2018  1:00 PM EDT  (Arrive by 12:45 PM)  RETURN  STEP with Rosezetta Schlatter, PMHNP  [facility] PSYCHIATRY STEP [facility]) [address]  [place] [address]  [phone]      Sep 14, 2018 10:30 AM EDT  (Arrive by 10:15 AM)  RETURN PFT 15 with [facility] PFT 4  [facility] PULMONARY SPECIALTY FUNCT [facility]) [address]  [address]  [place] [address]  [phone]      Sep 14, 2018 11:00 AM EDT  (Arrive by 10:45 AM)  RETURN BRONCHIECTSIS with Truett Mainland, MD  [facility] PULMONARY SPECIALTY CL [facility] [place] [facility]) [address]  [address]  [place] [address]  [phone]           ______________________________________________________________________  Discharge Day Services:   Subjective:   Stable overnight with O2 sat 95-97% on FiO2 21%  BM overnight after requesting enema.  Tolerating po. Voiding independently. Ambulatory around the unit with trach capped yesterday    Objective  BP 140/81  - Pulse 97  - Temp 36.6 ??C (Oral)  - Resp 23  - Ht 167.6 cm (5' 5.98)  - Wt (!) 103.9 kg (229 lb 0.9 oz)  - SpO2 95%  - BMI 36.99 kg/m??      General: asleep but arousable, lying in bed in no acute distress  HENT: tracheostomy in place with trach collar present and on the vent  Lungs: breathing comfortably. Course breath sounds bilaterally similarly to previous exams.  Cardiac: Regular rate and rhythm, no murmurs, gallops or rubs  Abdomen: soft, non-tender, bowel sounds are  normal, G tube present  Skin: warm, dry, intact    Pt seen on the day of discharge and determined appropriate for discharge.    Condition at Discharge: good    Length of Discharge: I spent greater than 30 mins in the discharge of this patient.      Scherrie Merritts MS-IV     I attest that I have reviewed the student note and that the components of the history of the present illness, the physical exam, and the assessment and plan documented were performed by me or were performed in my presence by the student where I verified the documentation and performed (or re-performed) the exam and medical decision making.   Harland Dingwall, MD  PGY-1 Internal Medicine Resident

## 2018-08-25 NOTE — Unmapped (Signed)
-----   Message from Glori Luis, [place] sent at 08/25/2018  2:52 PM EDT -----  Regarding: Transitions Patient   Hi Adaly Puder,    Please schedule into Transitions Clinic.    Thank you so much!    Rayhaan

## 2018-08-27 DIAGNOSIS — R0602 Shortness of breath: Principal | ICD-10-CM

## 2018-08-28 ENCOUNTER — Ambulatory Visit: Admit: 2018-08-28 | Discharge: 2018-10-08 | Disposition: E | Payer: MEDICARE

## 2018-08-28 LAB — CBC W/ AUTO DIFF
BASOPHILS ABSOLUTE COUNT: 0.1 10*9/L (ref 0.0–0.1)
BASOPHILS RELATIVE PERCENT: 0.6 %
EOSINOPHILS ABSOLUTE COUNT: 0.1 10*9/L (ref 0.0–0.4)
EOSINOPHILS RELATIVE PERCENT: 0.9 %
HEMATOCRIT: 34.4 % — ABNORMAL LOW (ref 41.0–53.0)
HEMOGLOBIN: 11.3 g/dL — ABNORMAL LOW (ref 13.5–17.5)
LYMPHOCYTES ABSOLUTE COUNT: 2.3 10*9/L (ref 1.5–5.0)
LYMPHOCYTES RELATIVE PERCENT: 26.5 %
MEAN CORPUSCULAR HEMOGLOBIN CONC: 32.8 g/dL (ref 31.0–37.0)
MEAN CORPUSCULAR HEMOGLOBIN: 24.3 pg — ABNORMAL LOW (ref 26.0–34.0)
MEAN CORPUSCULAR VOLUME: 74 fL — ABNORMAL LOW (ref 80.0–100.0)
MEAN PLATELET VOLUME: 7.1 fL (ref 7.0–10.0)
MONOCYTES RELATIVE PERCENT: 7.4 %
NEUTROPHILS ABSOLUTE COUNT: 5.3 10*9/L (ref 2.0–7.5)
NEUTROPHILS RELATIVE PERCENT: 61 %
PLATELET COUNT: 527 10*9/L — ABNORMAL HIGH (ref 150–440)
RED BLOOD CELL COUNT: 4.66 10*12/L (ref 4.50–5.90)
RED CELL DISTRIBUTION WIDTH: 18.1 % — ABNORMAL HIGH (ref 12.0–15.0)
WBC ADJUSTED: 8.6 10*9/L (ref 4.5–11.0)

## 2018-08-28 LAB — COMPREHENSIVE METABOLIC PANEL
ALBUMIN: 4.1 g/dL (ref 3.5–5.0)
ALKALINE PHOSPHATASE: 54 U/L (ref 38–126)
ALT (SGPT): 46 U/L (ref 19–72)
ANION GAP: 11 mmol/L (ref 9–15)
BLOOD UREA NITROGEN: 13 mg/dL (ref 7–21)
BUN / CREAT RATIO: 16
CALCIUM: 9.1 mg/dL (ref 8.5–10.2)
CHLORIDE: 104 mmol/L (ref 98–107)
CO2: 24 mmol/L (ref 22.0–30.0)
CREATININE: 0.83 mg/dL (ref 0.70–1.30)
EGFR CKD-EPI AA MALE: >90 mL/min/{1.73_m2} (ref >=60)
GLUCOSE RANDOM: 92 mg/dL (ref 65–179)
POTASSIUM: 4.3 mmol/L (ref 3.5–5.0)
PROTEIN TOTAL: 7.4 g/dL (ref 6.5–8.3)
SODIUM: 139 mmol/L (ref 135–145)

## 2018-08-28 LAB — NEUTROPHILS RELATIVE PERCENT: Lab: 61

## 2018-08-28 LAB — TRANSFERRIN: Transferrin:MCnc:Pt:Ser/Plas:Qn:: 329.6

## 2018-08-28 LAB — FREE T4: Thyroxine.free:MCnc:Pt:Ser/Plas:Qn:: 2.33 — ABNORMAL HIGH

## 2018-08-28 LAB — SODIUM: Sodium:SCnc:Pt:Ser/Plas:Qn:: 139

## 2018-08-28 LAB — IRON PANEL: IRON SATURATION (CALC): 11 % — ABNORMAL LOW (ref 20–50)

## 2018-08-28 LAB — TROPONIN I: Troponin I.cardiac:MCnc:Pt:Ser/Plas:Qn:: <0.034

## 2018-08-28 LAB — THYROID STIMULATING HORMONE: Thyrotropin:ACnc:Pt:Ser/Plas:Qn:: 2.47

## 2018-08-28 LAB — SMEAR REVIEW

## 2018-08-28 NOTE — Unmapped (Signed)
Pt rounds completed. The following needs were addressed, toileting, call bell within reach, pt belongings at bedside, side rails x2, bed in lowest and locked position. Pt resting in bed at this time. Pt in NAD. Respirations equal and unlabored. Pt remains alert and oriented x4. Denies any needs or complaints at this time.

## 2018-08-28 NOTE — Unmapped (Signed)
[facility]  Emergency Department Provider Note      ED Clinical Impression     Final diagnoses:   None       Initial Impression, ED Course, Assessment and Plan     Impression: Cameron Baker is a 27 y.o. male with a PMH of chronic SOB and tracheobronchitis, VATER syndrome, PEG placement, Bipolar I disorder, and hypothyroidism who presents with a chief complaint of shortness of breath and right chest pain.  Due to recent admission with pneumonia, and clinical symptoms it appears that patient is getting worse despite use of antibiotics.  At this time we will obtain labs, provide symptomatic treatment of shortness of breath and pain and will consult medicine regarding admission.    Plan: CBC, CMP, troponin, chest x-ray, medicine consult with plans for admission for antibiotics.    ED Course as of Aug 28 700   Sat Aug 28, 2018   0100 Patient is having pain, prescribed tylenol, motrin and 1 dose of oxycodone 5 mg.       0115 Troponin normal, CBC is unremarkable, CBC differential is unremarkable.      0140 CMP is unremarkable.       4010 Spoke with MAO and pt about admitting, pt ok with going to [facility].       [phone] Signed out to oncoming resident.            Additional Medical Decision Making     I have reviewed the vital signs and the nursing notes. Labs and radiology results that were available during my care of the patient were independently reviewed by me and considered in my medical decision making.     I staffed the case with the ED attending, Dr. Merrie Roof, MD.    I independently visualized the radiology images.   I reviewed the patient's prior medical records.   I discussed the case with the admitting provider.       Portions of this record have been created using Scientist, clinical (histocompatibility and immunogenetics). Dictation errors have been sought, but may not have been identified and corrected.  ____________________________________________       History     Chief Complaint  Shortness of Breath      HPI   Cameron Baker is a 27 y.o. male with a PMH of chronic SOB and tracheobronchitis, VATER syndrome, PEG placement, Bipolar I disorder, and hypothyroidism who presents with a chief complaint of shortness of breath and right chest pain. Patient was admitted at [facility] from 8/21-9/12 initially for cellulitis, which progressed to pneumonia and was initially discharged. Patient was readmitted due to worsening of symptoms and was readmitted on 08/19/18-08/24/18. He was eventually was discharged home with PO Augmentin for 10 days.  Patient reports that he began to feel worse again on Thursday.  Symptoms included shortness of breath, increased sputum that was green in color, chills, wheezing and chest pain.  Denies any fever, nausea, or abdominal pain.      Past Medical History:   Diagnosis Date   ??? ADHD    ??? Asthma    ??? Bipolar 1 disorder (CMS-HCC)    ??? Chronic tracheobronchitis (CMS-HCC)    ??? Constipation    ??? De Quervain's tenosynovitis    ??? Dysphagia    ??? Esophageal dysmotility    ??? Esophageal stricture    ??? GERD (gastroesophageal reflux disease)    ??? hypothyroid    ??? Seizures (CMS-HCC)    ??? Tracheobronchomalacia    ???  VATER syndrome    ??? Ventilator dependence (CMS-HCC)        Patient Active Problem List   Diagnosis   ??? Asthma   ??? Attention deficit disorder   ??? Slow transit constipation   ??? Dysphagia   ??? Esophageal reflux   ??? Hypothyroidism   ??? Class 1 obesity in adult   ??? Seizures (CMS-HCC)   ??? Gastrostomy status (CMS-HCC)   ??? Stricture and stenosis of esophagus   ??? Post traumatic stress disorder (PTSD)   ??? Mood disorder (CMS-HCC)   ??? Personality disorder (CMS-HCC)   ??? Chronic tracheobronchitis (CMS-HCC)   ??? Congenital tracheomalacia   ??? Tracheostomy dependence (CMS-HCC)   ??? VATER syndrome   ??? Muscle spasms of both lower extremities   ??? Hypogammaglobulinemia (CMS-HCC)   ??? Triangular fibrocartilage complex injury, left, subsequent encounter   ??? Enthesopathy of right hip region   ??? Dyspnea   ??? Acute asthma exacerbation   ??? Acute respiratory distress    ??? Nausea   ??? Skin lesion   ??? Acne vulgaris   ??? Pain of hand   ??? PEG (percutaneous endoscopic gastrostomy) status (CMS-HCC)   ??? History of home ventilator (CMS-HCC)   ??? Bipolar 1 disorder (CMS-HCC)   ??? Esophageal achalasia   ??? Cellulitis of left hand   ??? Social problem   ??? Central sleep apnea   ??? Fever       Past Surgical History:   Procedure Laterality Date   ??? ANAL DILATION  01/21/2006   ??? ESOPHAGOGASTRODUODENOSCOPY      multiple   ??? HIP SURGERY Right 01/19/2017    shaved   ??? IR INSERT PORT AGE GREATER THAN 5 YRS  06/22/2018    IR INSERT PORT AGE GREATER THAN 5 YRS 06/22/2018 Andres Labrum, MD IMG [facility]   ??? LATERAL RECTUS RECESSION  11/15/2003   ??? NISSEN FUNDOPLICATION  2001    x 2 redos   ??? PORTACATH PLACEMENT  01/21/2006    taken out same year   ??? PR ESOPHAGEAL MOTILITY STUDY, MANOMETRY N/A 04/23/2018    Procedure: ESOPHAGEAL MOTILITY STUDY W/INT & REP;  Surgeon: Nurse-Based Giproc;  [facility];  Service: Gastroenterology   ??? PR UP GI ENDOSCOPY,BALL DIL,30MM N/A 02/22/2018    Procedure: UGI ENDO; W/BALLOON DILAT ESOPHAGUS (<30MM DIAM);  Surgeon: Zetta Bills, MD;  [facility]: [facility];  Service: Gastroenterology   ??? PR UP GI ENDOSCOPY,BALL DIL,30MM N/A 05/20/2018    Procedure: UGI ENDO; W/BALLOON DILAT ESOPHAGUS (<30MM DIAM);  Surgeon: Rona Ravens, MD;  Location: [facility];  Service: Gastroenterology   ??? PR UP GI ENDOSCOPY,BALL DIL,30MM N/A 08/06/2018    Procedure: UGI ENDO; W/BALLOON DILAT ESOPHAGUS (<30MM DIAM);  Surgeon: Annie Paras, MD;  Location: [facility];  Service: Gastroenterology   ??? REMOVAL VENOUS ACCESS PORT [facility] HISTORICAL RESULT)  11/03/2006   ??? STRABISMUS SURGERY Right    ??? TRACHEOSTOMY  1994   ??? WISDOM TOOTH EXTRACTION  04/2012   ??? WRIST SURGERY      3 times.          Current Facility-Administered Medications:   ???  acetaminophen (TYLENOL) tablet 650 mg, 650 mg, Oral, Once, Moshe Salisbury, MD  ???  albuterol 2.5 mg /3 mL (0.083 %) nebulizer solution 5 mg, 5 mg, Nebulization, Once, Merrie Roof, MD  ???  ibuprofen (ADVIL,MOTRIN) tablet 600 mg, 600 mg, Oral, Once, Belenda Cruise A  Keshaun Dubey, MD  ???  ipratropium (ATROVENT) 0.02 % nebulizer solution 500 mcg, 500 mcg, Nebulization, Once, Merrie Roof, MD  ???  oxyCODONE (ROXICODONE) immediate release tablet 5 mg, 5 mg, Oral, Once, Moshe Salisbury, MD    Current Outpatient Medications:   ???  acetaminophen (TYLENOL) 325 MG tablet, Take 650 mg by mouth every six (6) hours as needed for pain., Disp: , Rfl:   ???  albuterol HFA 90 mcg/actuation inhaler, Inhale 2 puffs every six (6) hours as needed for wheezing., Disp: 1 Inhaler, Rfl: 11  ???  amoxicillin-clavulanate (AUGMENTIN) 875-125 mg per tablet, Take 1 tablet by mouth every twelve (12) hours. for 9 days, Disp: 18 tablet, Rfl: 0  ???  arformoterol (BROVANA), Inhale 2 mL (15 mcg total) by nebulization Two (2) times a day., Disp: 120 mL, Rfl: 11  ???  azithromycin (ZITHROMAX) 500 MG tablet, 1 tablet (500 mg total) by G-tube route 3 (three) times a week., Disp: 12 tablet, Rfl: 11  ???  budesonide (PULMICORT) 0.5 mg/2 mL nebulizer solution, Inhale 4 mL (1 mg total) by nebulization Two (2) times a day., Disp: 240 mL, Rfl: 11  ???  budesonide-formoterol (SYMBICORT) 160-4.5 mcg/actuation inhaler, Inhale 2 puffs Two (2) times a day., Disp: 1 Inhaler, Rfl: 11  ???  cetirizine (ZYRTEC) 10 MG tablet, 1 tablet (10 mg total) by G-tube route every evening., Disp: , Rfl: 0  ???  cholecalciferol, vitamin D3, (VITAMIN D3) 2,000 unit cap, 3 capsules (6,000 Units total) by G-tube route daily., Disp: , Rfl: 0  ???  citalopram (CELEXA) 40 MG tablet, 1 tablet (40 mg total) by G-tube route nightly., Disp: 90 tablet, Rfl: 0  ???  clindamycin (CLINDAGEL) 1 % gel, Apply topically Two (2) times a day., Disp: 60 g, Rfl: 6  ???  cyclobenzaprine (FLEXERIL) 5 MG tablet, Take 1 tablet (5 mg total) by mouth Three (3) times a day as needed for muscle spasms., Disp: 15 tablet, Rfl: 0  ???  diclofenac sodium (VOLTAREN) 1 % gel, Apply 2 g topically 4 (four) times a day as needed., Disp: 100 g, Rfl: 4  ???  dicyclomine (BENTYL) 10 mg capsule, 10 mg by G-tube route 4 (four) times a day as needed. , Disp: , Rfl:   ???  fluticasone (FLONASE) 50 mcg/actuation nasal spray, 2 sprays by Each Nare route daily. , Disp: , Rfl:   ???  gabapentin (NEURONTIN) 300 MG capsule, 1 capsule (300 mg total) by G-tube route Three (3) times a day., Disp: 180 capsule, Rfl: 3  ???  ibuprofen (ADVIL,MOTRIN) 600 MG tablet, 1 tablet (600 mg total) by G-tube route every six (6) hours as needed for pain., Disp: 30 tablet, Rfl: 1  ???  ipratropium-albuterol (DUO-NEB) 0.5-2.5 mg/3 mL nebulizer, Inhale 3 mL 4 (four) times a day. , Disp: , Rfl:   ???  levothyroxine (SYNTHROID, LEVOTHROID) 75 MCG tablet, 1 tablet (75 mcg total) by G-tube route daily., Disp: 90 tablet, Rfl: 3  ???  lidocaine-prilocaine (EMLA) cream, APPLY TOPICALLY DAILY AS NEEDED FOR PORT ACCESS, Disp: 30 g, Rfl: 0  ???  linaclotide (LINZESS) 290 mcg capsule, 1 capsule (290 mcg total) by G-tube route daily., Disp: 30 capsule, Rfl: 0  ???  LORazepam (ATIVAN) 0.5 MG tablet, Take 1 tablet (0.5mg ) twice daily and 1 tablet (0.5mg ) daily as needed for anxiety via G-tube, Disp: 70 tablet, Rfl: 2  ???  minocycline (MINOCIN,DYNACIN) 100 MG capsule, 1 capsule (100 mg total) by G-tube route nightly., Disp: 90 capsule,  Rfl: 3  ???  mirtazapine (REMERON) 15 MG tablet, 1 tablet (15 mg total) by G-tube route nightly., Disp: 90 tablet, Rfl: 0  ???  montelukast (SINGULAIR) 10 mg tablet, Take 1 tablet (10 mg total) by mouth nightly., Disp: 30 tablet, Rfl: 11  ???  nebulizers Misc, Provide 1 device  for use with inhaled medication., Disp: 2 each, Rfl: 11  ???  omeprazole (PRILOSEC) 20 MG capsule, 20 mg Two (2) times a day (30 minutes before a meal). Via G-tube, Disp: , Rfl:   ???  sodium chloride (NS) 0.9 % injection, INFUSE INTO VENOUS CATHETER 4 TIMES DAILY AS NEEDED (PORT FLUSH), Disp: 160 mL, Rfl: 0  ???  sodium chloride (NS) 0.9 % Soln, Infuse 10 mL into a venous catheter 4 (four) times a day as needed (port flush)., Disp: 60 vial, Rfl: 0  ???  sodium chloride 3 % nebulizer solution, Inhale 4 mL by nebulization 4 (four) times a day., Disp: 480 mL, Rfl: 1  ???  tobramycin, PF, (TOBI) 300 mg/5 mL nebulizer solution, INHALE (300MG ) VIA NEBULIZER EVERY TWELVE HOURS EVERY OTHER MONTH, Disp: 280 mL, Rfl: 5  ???  triamcinolone (KENALOG) 0.1 % cream, Apply topically Two (2) times a day., Disp: 15 g, Rfl: 11  ???  valproate (DEPAKENE) 250 mg/5 mL syrup, Take 5ml (250mg ) by G-tube every morning and 15ml (750mg ) every evening, Disp: 1850 mL, Rfl: 0    Allergies  Xopenex [levalbuterol hcl]; Allopurinol analogues; and Versed [midazolam]    Family History   Adopted: Yes       Social History  Social History     Tobacco Use   ??? Smoking status: Never Smoker   ??? Smokeless tobacco: Never Used   Substance Use Topics   ??? Alcohol use: Yes     Alcohol/week: 2.0 standard drinks     Types: 2 Cans of beer per week     Comment: on weekends sometimes   ??? Drug use: No       Review of Systems    Constitutional: Negative for fever. Positive for chills.   Eyes: Negative for visual changes.  ENT: Negative for sore throat.  Cardiovascular: Positive for chest pain on the right axillary thoracic wall.   Respiratory: Positive for shortness of breath.  Positive for productive cough with green sputum.   Gastrointestinal: Negative for abdominal pain, vomiting or diarrhea.  Genitourinary: Negative for dysuria.  Musculoskeletal: Negative for back pain.  Skin: Negative for rash.  Neurological: Negative for headaches, focal weakness or numbness.  Hematological/Lymphatic: Denies easy bruising.    Physical Exam     ED Triage Vitals [08/27/18 2147]   Enc Vitals Group      BP 130/74      Heart Rate 108      SpO2 Pulse       Resp 22      Temp 36.5 ??C (97.7 ??F)      Temp Source Oral      SpO2 97 %      Weight (!) 103 kg (227 lb 1.2 oz) Height 1.67 m (5' 5.75)      Head Circumference       Peak Flow       Pain Score       Pain Loc       Pain Edu?       Excl. in GC?        Constitutional: Alert and oriented. Well appearing and in no distress.  Eyes: Conjunctivae are normal.  ENT       Head: Normocephalic and atraumatic.       Nose: No congestion.       Mouth/Throat: Mucous membranes are moist.       Neck: trach collar is present.  Stridor due to underlying tracheomalacia.   Hematological/Lymphatic/Immunilogical: No cervical lymphadenopathy.  Cardiovascular: Normal rate, regular rhythm. Normal and symmetric distal pulses are present in all extremities.  Respiratory: Normal respiratory effort.  Nonlabored breathing.  Decreased breath sounds in the right lower lung.  Tender to palpation over the right axillary line the thorax.  Previous incisions are healed without discharge.  Gastrointestinal: Soft and nontender.  Bowel sounds are heard in all 4 quadrants.  PEG tube is present with erythematous skin surrounding the tube.  Musculoskeletal: Normal range of motion in all extremities.       Right lower leg: No tenderness or edema.       Left lower leg: No tenderness or edema.  Neurologic: Normal speech and language. No gross focal neurologic deficits are appreciated.  Skin: Skin is warm, dry and intact.  Abdominal wall with a PEG tube is.    Psychiatric: Mood and affect are normal. Speech and behavior are normal.      EKG     NORMAL SINUS RHYTHM  NORMAL ECG  WHEN COMPARED WITH ECG OF 27-May-2018 18:56,  NONSPECIFIC T WAVE ABNORMALITY NO LONGER EVIDENT IN LATERAL LEADS    Radiology     EXAM: XR CHEST 2 VIEWS  DATE: 08/28/2018 1:50 AM  ACCESSION: [id number]  DICTATED: 08/28/2018 1:51 AM  INTERPRETATION LOCATION: [facility]    CLINICAL INDICATION: 27 years old Male with COUGH ??    COMPARISON: 08/19/2018.    TECHNIQUE: PA and Lateral Chest Radiographs.    FINDINGS:     Right midlung opacity, greatly decreased since 08/19/2018. The left lung is radiographically clear.    No pleural effusion or pneumothorax.    Unremarkable cardiomediastinal silhouette.     Tracheostomy in place. Right IJ tunneled central venous catheter with tip in the right atrium.        Impression     Near complete resolution of previously visualized pulmonary opacities with faint residual right midlung opacity, nonspecific.       Procedures     No procedures performed.         Moshe Salisbury, MD  Resident  08/28/18 [phone]

## 2018-08-28 NOTE — Unmapped (Signed)
Celexa, Remeron, Singulair all ordered as nightly medications. MAR says they are due at 0444, this will be fixed by pharmacy to reflect the order acurately and therefore these medications are being held by this RN and RN Dois Davenport.

## 2018-08-28 NOTE — Unmapped (Addendum)
Pt here with chronic SOB, just DC 2 days ago for same. States I needs steroids and pain meds. Failed outpt, for pneumonia

## 2018-08-28 NOTE — Unmapped (Signed)
Attempted to give pt ordered meds, will need to obtain a connector for pt's g tube in order to administer prescribed medications. Will administer when connector is obtained.
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
## 2018-08-28 NOTE — Unmapped (Addendum)
Outpatient Follow Up Issues  [  ] F/u Thyroid function studies outside of acute illness    Cameron Baker is a 27 y.o. male with a history of VATER syndrome with tracheobronchomalacia s/p PEG and tracheostomy with nocturnal ventilation c/b recurrent tracheobronchitis, Bipolar I disorder, asthma, and hypothyroidism that presented to [facility] with Dyspnea, likely either recurrent tracheobronchitis vs asthma exacerbation after being discharged 4 days prior on a 14-day course of Augmentin which was supposed to end on 9/26.  ??  Asthma Exacerbation: The patient was admitted with some subjective shortness of breath with increased cough and secretions.  Based on his history, the patient had most likely either tracheobronchitis (patient has recurrent episodes of this and has noticed an increased cough and secretions; however is afebrile with no leukocytosis) vs asthma exacerbation (patient still had wheezing present on physical exam even after receiving 2 albuterol nebulizers). CXR done in the ED with near complete resolution of prior pulmonary opacities. Leading differential leans more toward asthma exacerbation given patient is well appearing, has no changes in oxygen requirements, afebrile, and has imaging consistent with improvement of previously treated pulmonary infection.  We also considered reactive airway after his recent rhinovirus infection.  Got sputum culture which showed ***.  We discontinued Augmentin and switched him to levofloxacin for potential tracheobronchitis, as the stenotrophomonas grown last admission was susceptible to this. We also started prednisone 40mg  daily given patient's wheezing on exam and concern for asthma exacerbation or reactive airway component. We maintained his home nebulizers as below as well.    #Thrombocytosis  He had elevation in his platelets to 527 on admission. We checked iron studies which were normal. He has had thrombocytosis in the past before which improves on its own.  We checked a ferritin, which was normal, which surprised [place] in setting of his chronic illnesses. We checked ESR/CRP which showed ***.    #VATER syndrome-associated tracheobronchomalacia s/p trach/PEG and nocturnal mechanical ventilation.??  We continued his home management of these issues. We did his home aformoterol and budesonide nebs BID, did airway clearance with cough assist with oscillation and Duonebs prior to 3% HTS, used his home vent settings at night and he was on azithromycin MWF. We discharged him on this same regimen.  ??  #Type 2 achalasia/ Esophageal stenosis  Balloon dilation of 2 esophageal stenoses 08/06/2018.   During previous hospitalization, concern for aspiration with new infiltrates on chest x-ray.  Chest x-ray this admission showed great improvement in infiltrates, so no concern for recurrent aspiration.    ??  Other Chronic Problems  #Microcytic anemia: at baseline, no in hospital interventions.  #Hypothyroidism: continued on home synthroid 75 mcg daily. TSH was normal, but T4 elevated, so we recommend f/u outpatient outside acute illness.  #Slow transit constipation: continued on??bentyl QID prn and required miralax and ***. Discharged on home bentyl and Linzess.  #Lower extremity muscle spasms: Continued on home cyclobenzaprine prn  #Acne: Minocycline continued from home.  #Bipolar 1 disorder??-??PTSD - Anxiety: Continued on home regimen (citalopram, valproate, mirtazapine, lorazepam 0.5 mg PO BID prn)

## 2018-08-28 NOTE — Unmapped (Addendum)
RT notified about need for lower respiratory culture, plan is to suction specimen from trach.

## 2018-08-28 NOTE — Unmapped (Signed)
Pt to x-ray via stretcher with transporter.

## 2018-08-28 NOTE — Unmapped (Signed)
Pt rounds completed. The following needs were addressed, toileting, call bell within reach, pt belongings at bedside, side rails x2, bed in lowest and locked position. Pt resting in bed at this time. Pt in NAD. Respirations equal and unlabored. Pt remains alert and oriented x4.   Pt complaining of same pain, states that previous pain meds have not affected pain. Admitting MD paged at this time.

## 2018-08-29 LAB — SODIUM: Sodium:SCnc:Pt:Ser/Plas:Qn:: 139

## 2018-08-29 LAB — CBC
HEMATOCRIT: 36.7 % — ABNORMAL LOW (ref 41.0–53.0)
HEMOGLOBIN: 11.3 g/dL — ABNORMAL LOW (ref 13.5–17.5)
MEAN CORPUSCULAR HEMOGLOBIN CONC: 30.9 g/dL — ABNORMAL LOW (ref 31.0–37.0)
MEAN CORPUSCULAR HEMOGLOBIN: 23.7 pg — ABNORMAL LOW (ref 26.0–34.0)
MEAN CORPUSCULAR VOLUME: 76.7 fL — ABNORMAL LOW (ref 80.0–100.0)
MEAN PLATELET VOLUME: 7.2 fL (ref 7.0–10.0)
PLATELET COUNT: 454 10*9/L — ABNORMAL HIGH (ref 150–440)
RED BLOOD CELL COUNT: 4.78 10*12/L (ref 4.50–5.90)
RED CELL DISTRIBUTION WIDTH: 17.5 % — ABNORMAL HIGH (ref 12.0–15.0)

## 2018-08-29 LAB — BASIC METABOLIC PANEL
BLOOD UREA NITROGEN: 9 mg/dL (ref 7–21)
BUN / CREAT RATIO: 11
CALCIUM: 9.5 mg/dL (ref 8.5–10.2)
CHLORIDE: 100 mmol/L (ref 98–107)
CO2: 26 mmol/L (ref 22.0–30.0)
CREATININE: 0.79 mg/dL (ref 0.70–1.30)
EGFR CKD-EPI AA MALE: >90 mL/min/{1.73_m2} (ref >=60)
GLUCOSE RANDOM: 120 mg/dL (ref 65–179)
POTASSIUM: 4.3 mmol/L (ref 3.5–5.0)
SODIUM: 139 mmol/L (ref 135–145)

## 2018-08-29 LAB — MAGNESIUM: Magnesium:MCnc:Pt:Ser/Plas:Qn:: 1.8

## 2018-08-29 LAB — MEAN CORPUSCULAR HEMOGLOBIN CONC: Lab: 30.9 — ABNORMAL LOW

## 2018-08-29 LAB — FERRITIN: Ferritin:MCnc:Pt:Ser/Plas:Qn:: 36.1

## 2018-08-29 NOTE — Unmapped (Addendum)
Patient was on room air this morning, Later was placed on trach collar @ 28% Fio2 for humidification and minor desaturation episodes. Patient desaturated later in the day and had to be increased to 35% Fio2. Patient currently stable. Tolerating all breathing treatments well with no adverse reactions. Suctioned only once today with minimal secretions. Patent airway maintained.Will continue to monitor closely.      Problem: Communication Impairment (Artificial Airway)  Goal: Effective Communication  Outcome: Progressing  Intervention: Ensure Effective Communication  Flowsheets (Taken 08/28/2018 1737)  Communication Enhancement Strategies: speaking valve use encouraged;communication device used     Problem: Device-Related Complication Risk (Artificial Airway)  Goal: Optimal Device Function  Outcome: Progressing     Problem: Skin and Tissue Injury (Artificial Airway)  Goal: Absence of Device-Related Skin or Tissue Injury  Intervention: Maintain Skin and Tissue Health  Flowsheets (Taken 08/28/2018 1737)  Device Skin Pressure Protection: positioning supports utilized;other (see comments)  Note:   Patient chooses not to wear drainage sponge under trach     Problem: Breathing Pattern Ineffective  Goal: Effective Breathing Pattern  Outcome: Ongoing - Unchanged  Intervention: Promote Improved Breathing Pattern  Flowsheets (Taken 08/28/2018 1737)  Head of Bed (HOB): HOB at 30-45 degrees  Airway/Ventilation Management: airway patency maintained;calming measures promoted;humidification applied;pulmonary hygiene promoted

## 2018-08-29 NOTE — Unmapped (Signed)
Pt has been alert this shift with no c/o pain. VSS, afebrile. Sats WNL on 35% TC. Falls precautions maintained. No signs of skin breakdown.     Problem: Adult Inpatient Plan of Care  Goal: Plan of Care Review  Outcome: Progressing  Goal: Patient-Specific Goal (Individualization)  Outcome: Progressing  Goal: Absence of Hospital-Acquired Illness or Injury  Outcome: Progressing  Goal: Optimal Comfort and Wellbeing  Outcome: Progressing  Goal: Readiness for Transition of Care  Outcome: Progressing  Goal: Rounds/Family Conference  Outcome: Progressing     Problem: Pain Chronic (Persistent) (Comorbidity Management)  Goal: Acceptable Pain Control and Functional Ability  Outcome: Progressing     Problem: Communication Impairment (Artificial Airway)  Goal: Effective Communication  Outcome: Progressing     Problem: Device-Related Complication Risk (Artificial Airway)  Goal: Optimal Device Function  Outcome: Progressing     Problem: Skin and Tissue Injury (Artificial Airway)  Goal: Absence of Device-Related Skin or Tissue Injury  Outcome: Progressing     Problem: Breathing Pattern Ineffective  Goal: Effective Breathing Pattern  Outcome: Progressing

## 2018-08-29 NOTE — Unmapped (Signed)
A&O x 4. VSS on 35% TC. ST in the 100s. Pressures stable this shift. No complaints of pain. SQ lovenox ordered for VTE prophylaxis. Voiding with urinal, no BM this shift. Admission screening completed. PRN phenergan ordered for nausea. Fall and aspiration precautions maintained. Will continue to monitor and maintain safety.      Problem: Adult Inpatient Plan of Care  Goal: Plan of Care Review  Outcome: Progressing  Goal: Patient-Specific Goal (Individualization)  Outcome: Progressing  Goal: Absence of Hospital-Acquired Illness or Injury  Outcome: Progressing  Goal: Optimal Comfort and Wellbeing  Outcome: Progressing  Goal: Readiness for Transition of Care  Outcome: Progressing  Goal: Rounds/Family Conference  Outcome: Progressing     Problem: Pain Chronic (Persistent) (Comorbidity Management)  Goal: Acceptable Pain Control and Functional Ability  Outcome: Progressing     Problem: Communication Impairment (Artificial Airway)  Goal: Effective Communication  Outcome: Progressing     Problem: Device-Related Complication Risk (Artificial Airway)  Goal: Optimal Device Function  Outcome: Progressing     Problem: Skin and Tissue Injury (Artificial Airway)  Goal: Absence of Device-Related Skin or Tissue Injury  Outcome: Progressing     Problem: Breathing Pattern Ineffective  Goal: Effective Breathing Pattern  Outcome: Progressing

## 2018-08-30 DIAGNOSIS — R0602 Shortness of breath: Principal | ICD-10-CM

## 2018-08-30 LAB — BASIC METABOLIC PANEL
BLOOD UREA NITROGEN: 12 mg/dL (ref 7–21)
BUN / CREAT RATIO: 18
CALCIUM: 8.6 mg/dL (ref 8.5–10.2)
CO2: 28 mmol/L (ref 22.0–30.0)
CREATININE: 0.68 mg/dL — ABNORMAL LOW (ref 0.70–1.30)
EGFR CKD-EPI AA MALE: >90 mL/min/{1.73_m2} (ref >=60)
EGFR CKD-EPI NON-AA MALE: >90 mL/min/{1.73_m2} (ref >=60)
GLUCOSE RANDOM: 145 mg/dL (ref 65–179)
POTASSIUM: 3.9 mmol/L (ref 3.5–5.0)
SODIUM: 141 mmol/L (ref 135–145)

## 2018-08-30 LAB — CBC
HEMATOCRIT: 32.1 % — ABNORMAL LOW (ref 41.0–53.0)
MEAN CORPUSCULAR HEMOGLOBIN CONC: 31.4 g/dL (ref 31.0–37.0)
MEAN CORPUSCULAR HEMOGLOBIN: 23.7 pg — ABNORMAL LOW (ref 26.0–34.0)
MEAN PLATELET VOLUME: 6.6 fL — ABNORMAL LOW (ref 7.0–10.0)
PLATELET COUNT: 436 10*9/L (ref 150–440)
RED BLOOD CELL COUNT: 4.25 10*12/L — ABNORMAL LOW (ref 4.50–5.90)
RED CELL DISTRIBUTION WIDTH: 18 % — ABNORMAL HIGH (ref 12.0–15.0)
WBC ADJUSTED: 7.1 10*9/L (ref 4.5–11.0)

## 2018-08-30 LAB — C-REACTIVE PROTEIN: C reactive protein:MCnc:Pt:Ser/Plas:Qn:: 26.5 — ABNORMAL HIGH

## 2018-08-30 LAB — MEAN CORPUSCULAR VOLUME: Lab: 75.4 — ABNORMAL LOW

## 2018-08-30 LAB — BUN / CREAT RATIO: Urea nitrogen/Creatinine:MRto:Pt:Ser/Plas:Qn:: 18

## 2018-08-30 LAB — MAGNESIUM: Magnesium:MCnc:Pt:Ser/Plas:Qn:: 1.7

## 2018-08-30 LAB — ERYTHROCYTE SEDIMENTATION RATE: Lab: 31 — ABNORMAL HIGH

## 2018-08-30 NOTE — Unmapped (Signed)
Patient currently stable on 30% trach collar, maintaining saturation.Patent airway maintained. Patient goes on vent @ night as needed. Patient tolerating all respiratory medications well. No adverse reactions. Sputum sample obtained earlier, sent to lab, but was told it had too many epithelial cells. If needed will try again tomorrow, since patient has minimal to no secretions. Will continue to monitor.      Problem: Communication Impairment (Artificial Airway)  Goal: Effective Communication  Outcome: Progressing  Intervention: Ensure Effective Communication  Flowsheets (Taken 08/28/2018 1737)  Communication Enhancement Strategies: speaking valve use encouraged;communication device used     Problem: Device-Related Complication Risk (Artificial Airway)  Goal: Optimal Device Function  Intervention: Optimize Device Care and Function  Flowsheets  Taken 08/28/2018 1737  Airway/Ventilation Management: airway patency maintained;calming measures promoted;humidification applied;pulmonary hygiene promoted  Taken 08/29/2018 1835  Aspiration Precautions: respiratory status monitored;upright posture maintained  Airway Safety Measures: manual resuscitator at bedside;suction at bedside     Problem: Skin and Tissue Injury (Artificial Airway)  Goal: Absence of Device-Related Skin or Tissue Injury  Outcome: Progressing  Intervention: Maintain Skin and Tissue Health  Flowsheets (Taken 08/29/2018 1835)  Device Skin Pressure Protection: skin-to-device areas padded; absorbent pad utilized/changed; positioning supports utilized     Problem: Breathing Pattern Ineffective  Goal: Effective Breathing Pattern  Intervention: Promote Improved Breathing Pattern  Flowsheets  Taken 08/29/2018 1835  Head of Bed (HOB): HOB at 30-45 degrees  Breathing Techniques/Airway Clearance: deep/controlled cough encouraged  Taken 08/28/2018 1737  Airway/Ventilation Management: airway patency maintained;calming measures promoted;humidification applied;pulmonary hygiene promoted

## 2018-08-30 NOTE — Unmapped (Signed)
Med G Daily Progress Note    Interval History/Subjective:  P this morning, the patient reports feeling about the same as he did when he came in.  He feels very wheezy.  We are continuing Levaquin and prednisone.  It is unclear what his oxygen requirement truly is currently. He is documented as having FiO2 of 30-35% on the flowsheet, but no desats recorded as previously seen the night before. Will give another sample for LRCx due to previous inadequacy.     Assessment/Plan:    Principal Problem:    Dyspnea  Active Problems:    Asthma    Hypothyroidism    Chronic tracheobronchitis (CMS-HCC)    Tracheostomy dependence (CMS-HCC)    VATER syndrome    PEG (percutaneous endoscopic gastrostomy) status (CMS-HCC)    History of home ventilator (CMS-HCC)    Bipolar 1 disorder (CMS-HCC)  Resolved Problems:    * No resolved hospital problems. *      Cameron Baker is a 27 y.o. male with a history of VATER syndrome with tracheobronchomalacia s/p PEG and tracheostomy with nocturnal ventilation c/b recurrent tracheobronchitis, Bipolar I disorder, asthma, and hypothyroidism that presented to [facility] with Dyspnea, likely either recurrent tracheobronchitis vs asthma exacerbation.  ??  Asthma Exacerbation: Most likely etiology has been ruled to be an asthma exacerbation (patient still had wheezing present on physical exam even after receiving 2 albuterol nebulizers) with some mild component of tracheobronchitis that may be resolving 2/2 his prior infection for which we are continuing antibiotics.  - Obtain sputum culture (tracheal aspirate) again as prior inadequate sample  - Continue Levaquin (9/21--) - will discuss end date  - Continue prednisone 40mg  daily (9/21--). Will do a 14 day course and discuss taper.  - Continue home nebulizers as below    Thrombocytosis  He had elevation in his platelets to 527 on admission. We ruled out iron deficiency as a cause as his iron studies were normal. We obtained a ferritin which was low. Of note, he has had thrombocytosis in the past before which improves on its own.  We will check ESR/CRP for other acute phase reactants.  - F/u ESR/CRP  ??  VATER with associated tracheobronchomalacia s/p trach and PEG, on chronic nocturnal vent  - Continue aformoterol nebs BID  - Continue budesonide nebs BID  - Airway clearance with 3% HTS (with duonebs prior to HTS)  - Continue home azithromycin MWF  - Continue home Trilogy vent qhs (AVAPS, VT 525 ml, PEEP 10 cm H2O)  ??  Bipolar I: Continue home citalopram, valproate, mirtazapine, and lorazepam  Hypothyroidism: Continue home synthroid daily  Microcytic anemia: at baseline, no in hospital interventions.  Slow transit constipation: continued on??bentyl QID prn and daily miralax  Lower extremity muscle spasms: Continued on home??cyclobenzaprine prn  Acne:??Restarted home minocycline now that he is on these antibiotics.  ??  DVT ppx: lovenox   Code Status: Full Code  Dispo: Med G, stepdown given nocturnal vent  ___________________________________________________________________    Labs/Studies:  Labs and Studies from the last 24hrs per EMR and Reviewed    Objective:  Temp:  [36.5 ??C-37.5 ??C] 36.8 ??C  Heart Rate:  [98-126] 98  SpO2 Pulse:  [95-124] 95  Resp:  [13-20] 16  BP: (103-160)/(49-71) 103/49  FiO2 (%):  [21 %-35 %] 21 %  SpO2:  [93 %-100 %] 98 %, I/O this shift:  In: 860 [P.O.:360; NG/GT:500]  Out: 300 [Urine:300],   Wt Readings from Last 3 Encounters:   08/27/18 Marland Kitchen)  103 kg (227 lb 1.2 oz)   08/20/18 (!) 103.9 kg (229 lb 0.9 oz)   08/06/18 95.7 kg (211 lb)       General: well-developed, asleep in bed on trach collar but arousable, appears comfortable  NECK: trachea midline with tracheostomy in place  RESP: Coarse breath sounds bilaterally, with fainter wheezes than prior.   CV: Regular rate and rhythm. Normal S1 and S2.

## 2018-08-30 NOTE — Unmapped (Signed)
Med G Daily Progress Note    Interval History/Subjective:  Pt reported some desats to high 80s overnight, needed to be turned up from 21 --> 28% --> 35%. States he feels a little bit more SOB today though not coughing as much. Will give sample for LRCx. Labs stable, ferritin pending for workup of anemia. Continuing levaquin and prednisone. Asked [place] whether there is any utility to using IV steroids, we discussed that for a non-severe asthma exacerbation usually there isn't.     Assessment/Plan:    Principal Problem:    Dyspnea  Active Problems:    Asthma    Hypothyroidism    Chronic tracheobronchitis (CMS-HCC)    Tracheostomy dependence (CMS-HCC)    VATER syndrome    PEG (percutaneous endoscopic gastrostomy) status (CMS-HCC)    History of home ventilator (CMS-HCC)    Bipolar 1 disorder (CMS-HCC)  Resolved Problems:    * No resolved hospital problems. *      Cameron Baker is a 27 y.o. male with a history of VATER syndrome with tracheobronchomalacia s/p PEG and tracheostomy with nocturnal ventilation c/b recurrent tracheobronchitis, Bipolar I disorder, asthma, and hypothyroidism that presented to [facility] with Dyspnea, likely either recurrent tracheobronchitis vs asthma exacerbation.  ??  Dyspnea: Most likely either tracheobronchitis (patient has recurrent episodes of this and has noticed an increased cough and secretions; however is afebrile with no leukocytosis) vs asthma exacerbation (patient still had wheezing present on physical exam even after receiving 2 albuterol nebulizers). CXR done in the ED with near complete resolution of prior pulmonary opacities. Leading differential leans more toward asthma exacerbation given patient is well appearing, has no changes in oxygen requirements, afebrile, and has imaging consistent with improvement of previously treated pulmonary infection.   - Obtain sputum culture (tracheal aspirate)  - Continue Levaquin (9/21--)  - Continue prednisone 40mg  daily (9/21--) likely 5d course  - Continue home nebulizers as below  ??  VATER with associated tracheobronchomalacia s/p trach and PEG, on chronic nocturnal vent  - Continue aformoterol nebs BID  - Continue budesonide nebs BID  - Airway clearance with 3% HTS (with duonebs prior to HTS)  - Continue home azithromycin MWF  - Continue home Trilogy vent qhs (AVAPS, VT 525 ml, PEEP 10 cm H2O)  ??  Bipolar I: Continue home citalopram, valproate, mirtazapine, and lorazepam  Hypothyroidism: Continue home synthroid daily  ??  DVT ppx: lovenox   Code Status: Full Code  Dispo: Med G, stepdown given nocturnal vent  ___________________________________________________________________    Labs/Studies:  Labs and Studies from the last 24hrs per EMR and Reviewed    Objective:  Temp:  [36.8 ??C-37.3 ??C] 37.3 ??C  Heart Rate:  [93-117] 116  SpO2 Pulse:  [93-116] 116  Resp:  [13-22] 16  BP: (110-145)/(59-84) 141/62  FiO2 (%):  [21 %-35 %] 21 %  SpO2:  [93 %-99 %] 94 %, I/O this shift:  In: 100 [NG/GT:100]  Out: 200 [Urine:200],   Wt Readings from Last 3 Encounters:   08/27/18 (!) 103 kg (227 lb 1.2 oz)   08/20/18 (!) 103.9 kg (229 lb 0.9 oz)   08/06/18 95.7 kg (211 lb)       General: well-developed, laying comfortably in bed, no acute distress  HEENT: PERRL. EOMI. Sclerae anicteric and without injection. Oropharynx moist.   NECK: trachea midline and mobile with tracheostomy in place, no submandibular or cervical lymphadenopathy  RESP: Mild wheezing improved from yesterday, though still more audible over mid- L lung field  CV: Regular rate and rhythm. Normal S1 and S2. No murmurs or gallops.   ABD: NABS, soft, non-distended, non-tender to palpation. G tube in place  EXT: No lower extremity edema, cyanosis, or clubbing. Posterior tibial pulses are 2+ and symmetric.   NEURO: No focal neurologic deficits, A/Ox3

## 2018-08-30 NOTE — Unmapped (Signed)
Problem: Adult Inpatient Plan of Care  Goal: Plan of Care Review  Outcome: Progressing  Goal: Patient-Specific Goal (Individualization)  Outcome: Progressing  Goal: Absence of Hospital-Acquired Illness or Injury  Outcome: Progressing  Goal: Optimal Comfort and Wellbeing  Outcome: Progressing  Goal: Readiness for Transition of Care  Outcome: Progressing  Goal: Rounds/Family Conference  Outcome: Progressing       Patient minimally improved this shift, no desaturation events, continues on prednisone and abx regimen.  Wears triology home vent at night, will do so with room air oxygen levels tonight to assess patient being back to baseline of only pressure support while asleep.   Uses PSMV while awake without oxygen requirement.

## 2018-08-30 NOTE — Unmapped (Signed)
Social Work Futures trader Progress Note    SW stopped by pt's room for PSA; pt asleep. SW will attempt visit at a later time. Cameron Baker  August 30, 2018 2:11 PM

## 2018-08-30 NOTE — Unmapped (Addendum)
Pt is AxO x4. VSS. Trach mask 21% during the day and 30-35% Vent at night. BP stable. Complaints of right rib cage/abd/hip pain, MD made aware and ordered one time oxy, pt requested a PRN and MD ordered toradol q6, pt stated that he didn't feel the PRN was adequately managing his pain, MD made aware and order 5mg  of oxy one time, upon returning back to pts room pt was asleep and had no other complaints of pain throughout the night. Pt able to ambulate and walk off the unit with pass. Voiding via urinal. No BM this shift. Safety and falls precautions maintained. Will CTM     Problem: Adult Inpatient Plan of Care  Goal: Plan of Care Review  Outcome: Progressing  Goal: Patient-Specific Goal (Individualization)  Outcome: Progressing  Goal: Absence of Hospital-Acquired Illness or Injury  Outcome: Progressing  Goal: Optimal Comfort and Wellbeing  Outcome: Progressing  Goal: Readiness for Transition of Care  Outcome: Progressing  Goal: Rounds/Family Conference  Outcome: Progressing     Problem: Pain Chronic (Persistent) (Comorbidity Management)  Goal: Acceptable Pain Control and Functional Ability  Outcome: Progressing     Problem: Communication Impairment (Artificial Airway)  Goal: Effective Communication  Outcome: Progressing     Problem: Device-Related Complication Risk (Artificial Airway)  Goal: Optimal Device Function  Outcome: Progressing     Problem: Skin and Tissue Injury (Artificial Airway)  Goal: Absence of Device-Related Skin or Tissue Injury  Outcome: Progressing     Problem: Breathing Pattern Ineffective  Goal: Effective Breathing Pattern  Outcome: Progressing

## 2018-08-30 NOTE — Unmapped (Signed)
A&O x 4. VSS on 35% TC. ST in the 100s. Pressures stable this shift. No complaints of pain. SQ lovenox ordered for VTE prophylaxis. Voiding with urinal, no BM this shift. PRN phenergan ordered for nausea. Fall and aspiration precautions maintained. Will continue to monitor and maintain safety.    Problem: Adult Inpatient Plan of Care  Goal: Plan of Care Review  Outcome: Progressing  Goal: Patient-Specific Goal (Individualization)  Outcome: Progressing  Goal: Absence of Hospital-Acquired Illness or Injury  Outcome: Progressing  Goal: Optimal Comfort and Wellbeing  Outcome: Progressing  Goal: Readiness for Transition of Care  Outcome: Progressing  Goal: Rounds/Family Conference  Outcome: Progressing     Problem: Pain Chronic (Persistent) (Comorbidity Management)  Goal: Acceptable Pain Control and Functional Ability  Outcome: Progressing     Problem: Communication Impairment (Artificial Airway)  Goal: Effective Communication  Outcome: Progressing     Problem: Device-Related Complication Risk (Artificial Airway)  Goal: Optimal Device Function  Outcome: Progressing     Problem: Skin and Tissue Injury (Artificial Airway)  Goal: Absence of Device-Related Skin or Tissue Injury  Outcome: Progressing     Problem: Breathing Pattern Ineffective  Goal: Effective Breathing Pattern  Outcome: Progressing

## 2018-08-30 NOTE — Unmapped (Signed)
Problem: Communication Impairment (Artificial Airway)  Goal: Effective Communication  Outcome: Progressing  Intervention: Ensure Effective Communication  Flowsheets (Taken 08/30/2018 0320)  Communication Enhancement Strategies: speaking valve use encouraged     Problem: Device-Related Complication Risk (Artificial Airway)  Goal: Optimal Device Function  Outcome: Progressing  Intervention: Optimize Device Care and Function  Flowsheets (Taken 08/30/2018 0320)  Airway/Ventilation Management: airway patency maintained; humidification applied; pulmonary hygiene promoted; other (see comments) (extra trachs and obturator at bedside)  Aspiration Precautions: respiratory status monitored  Airway Safety Measures: manual resuscitator/mask/valve in room; suction at bedside     Problem: Skin and Tissue Injury (Artificial Airway)  Goal: Absence of Device-Related Skin or Tissue Injury  Outcome: Progressing  Intervention: Maintain Skin and Tissue Health  Flowsheets (Taken 08/29/2018 1835 by Bonnee Quin, RRT)  Device Skin Pressure Protection: skin-to-device areas padded;absorbent pad utilized/changed;positioning supports utilized     Problem: Ventilator-Induced Lung Injury (Mechanical Ventilation, Invasive)  Goal: Absence of Ventilator-Induced Lung Injury  Outcome: Progressing  Intervention: Facilitate Lung-Protection Measures  Flowsheets (Taken 08/30/2018 0320)  Lung Protection Measures: ventilator synchrony promoted; ventilator waveforms monitored; low tidal volume provided; low inspiratory pressure provided  Intervention: Prevent Ventilator-Associated Pneumonia  Flowsheets  Taken 08/30/2018 0130  Head of Bed (HOB): HOB at 30-45 degrees  Taken 08/30/2018 0320  VAP Prevention Bundle: HOB elevation maintained  Note:   Cameron Baker is secure and midline.  Patient was compliant with all inhaled scheduled medications and was under no apparent distress. Patient rested on trilogy overnight.  Vital signs are currently stable with no visible sign of respiratory distress noted.

## 2018-08-31 DIAGNOSIS — R0602 Shortness of breath: Principal | ICD-10-CM

## 2018-08-31 LAB — BLOOD GAS CRITICAL CARE PANEL, ARTERIAL
BASE EXCESS ARTERIAL: 1.6 (ref -2.0–2.0)
CALCIUM IONIZED ARTERIAL (MG/DL): 4.54 mg/dL (ref 4.40–5.40)
FIO2 ARTERIAL: 40
GLUCOSE WHOLE BLOOD: 283 mg/dL
HCO3 ARTERIAL: 25 mmol/L (ref 22–27)
HEMOGLOBIN BLOOD GAS: 11.4 g/dL — ABNORMAL LOW (ref 13.50–17.50)
LACTATE BLOOD ARTERIAL: 4.5 mmol/L — ABNORMAL HIGH (ref <=1.2)
O2 SATURATION ARTERIAL: 97.8 % (ref 94.0–100.0)
PH ARTERIAL: 7.43 (ref 7.35–7.45)
POTASSIUM WHOLE BLOOD: 4.5 mmol/L (ref 3.4–4.6)
SODIUM WHOLE BLOOD: 136 mmol/L (ref 135–145)

## 2018-08-31 LAB — CBC
HEMATOCRIT: 33.3 % — ABNORMAL LOW (ref 41.0–53.0)
HEMOGLOBIN: 10.2 g/dL — ABNORMAL LOW (ref 13.5–17.5)
HEMOGLOBIN: 10.7 g/dL — ABNORMAL LOW (ref 13.5–17.5)
MEAN CORPUSCULAR HEMOGLOBIN CONC: 30.8 g/dL — ABNORMAL LOW (ref 31.0–37.0)
MEAN CORPUSCULAR HEMOGLOBIN CONC: 31.8 g/dL (ref 31.0–37.0)
MEAN CORPUSCULAR HEMOGLOBIN: 23.6 pg — ABNORMAL LOW (ref 26.0–34.0)
MEAN CORPUSCULAR HEMOGLOBIN: 24.5 pg — ABNORMAL LOW (ref 26.0–34.0)
MEAN CORPUSCULAR VOLUME: 76.7 fL — ABNORMAL LOW (ref 80.0–100.0)
MEAN CORPUSCULAR VOLUME: 77 fL — ABNORMAL LOW (ref 80.0–100.0)
MEAN PLATELET VOLUME: 7.8 fL (ref 7.0–10.0)
PLATELET COUNT: 342 10*9/L (ref 150–440)
PLATELET COUNT: 327 10*9/L (ref 150–440)
RED BLOOD CELL COUNT: 4.34 10*12/L — ABNORMAL LOW (ref 4.50–5.90)
RED BLOOD CELL COUNT: 4.36 10*12/L — ABNORMAL LOW (ref 4.50–5.90)
RED CELL DISTRIBUTION WIDTH: 17.6 % — ABNORMAL HIGH (ref 12.0–15.0)
RED CELL DISTRIBUTION WIDTH: 17.4 % — ABNORMAL HIGH (ref 12.0–15.0)
WBC ADJUSTED: 8.6 10*9/L (ref 4.5–11.0)
WBC ADJUSTED: 5.5 10*9/L (ref 4.5–11.0)

## 2018-08-31 LAB — URINALYSIS
BACTERIA: NONE SEEN /HPF
BILIRUBIN UA: NEGATIVE
GLUCOSE UA: >1000 — AB
KETONES UA: NEGATIVE
LEUKOCYTE ESTERASE UA: NEGATIVE
NITRITE UA: NEGATIVE
PH UA: 6 (ref 5.0–9.0)
PROTEIN UA: NEGATIVE
RBC UA: <1 /HPF (ref <=3)
SPECIFIC GRAVITY UA: 1.028 (ref 1.003–1.030)
SQUAMOUS EPITHELIAL: <1 /HPF (ref 0–5)
UROBILINOGEN UA: 0.2
WBC UA: 1 /HPF (ref <=2)

## 2018-08-31 LAB — HEPATIC FUNCTION PANEL
ALBUMIN: 3.6 g/dL (ref 3.5–5.0)
ALKALINE PHOSPHATASE: 57 U/L (ref 38–126)
ALT (SGPT): 25 U/L (ref 19–72)
AST (SGOT): 26 U/L (ref 19–55)
BILIRUBIN DIRECT: <0.1 mg/dL (ref 0.00–0.40)

## 2018-08-31 LAB — BASIC METABOLIC PANEL
ANION GAP: 10 mmol/L (ref 9–15)
BLOOD UREA NITROGEN: 12 mg/dL (ref 7–21)
BLOOD UREA NITROGEN: 9 mg/dL (ref 7–21)
BUN / CREAT RATIO: 16
BUN / CREAT RATIO: 12
CALCIUM: 8.8 mg/dL (ref 8.5–10.2)
CALCIUM: 8.9 mg/dL (ref 8.5–10.2)
CHLORIDE: 102 mmol/L (ref 98–107)
CHLORIDE: 100 mmol/L (ref 98–107)
CO2: 23 mmol/L (ref 22.0–30.0)
CREATININE: 0.78 mg/dL (ref 0.70–1.30)
EGFR CKD-EPI AA MALE: >90 mL/min/{1.73_m2} (ref >=60)
EGFR CKD-EPI AA MALE: >90 mL/min/{1.73_m2} (ref >=60)
EGFR CKD-EPI NON-AA MALE: >90 mL/min/{1.73_m2} (ref >=60)
EGFR CKD-EPI NON-AA MALE: >90 mL/min/{1.73_m2} (ref >=60)
GLUCOSE RANDOM: 162 mg/dL (ref 65–179)
GLUCOSE RANDOM: 337 mg/dL — ABNORMAL HIGH (ref 65–179)
POTASSIUM: 4.4 mmol/L (ref 3.5–5.0)
POTASSIUM: 5 mmol/L (ref 3.5–5.0)
SODIUM: 142 mmol/L (ref 135–145)
SODIUM: 136 mmol/L (ref 135–145)

## 2018-08-31 LAB — CLARITY

## 2018-08-31 LAB — BASE EXCESS ARTERIAL: Base excess:SCnc:Pt:BldA:Qn:Calculated: 1.6

## 2018-08-31 LAB — ALT (SGPT): Alanine aminotransferase:CCnc:Pt:Ser/Plas:Qn:: 25

## 2018-08-31 LAB — EGFR CKD-EPI NON-AA MALE: Lab: >90

## 2018-08-31 LAB — LACTATE BLOOD VENOUS: Lactate:SCnc:Pt:BldV:Qn:: 3.3 — ABNORMAL HIGH

## 2018-08-31 LAB — CO2: Carbon dioxide:SCnc:Pt:Ser/Plas:Qn:: 23

## 2018-08-31 LAB — MAGNESIUM: Magnesium:MCnc:Pt:Ser/Plas:Qn:: 1.6

## 2018-08-31 LAB — HEMOGLOBIN: Lab: 10.2 — ABNORMAL LOW

## 2018-08-31 LAB — RED CELL DISTRIBUTION WIDTH: Lab: 17.4 — ABNORMAL HIGH

## 2018-08-31 NOTE — Unmapped (Signed)
While visiting with [name] today he states that he is having increased work of breathing and bloody secretions.  I noted a small amount of old appearing blood streaking mixed with creamy secretions in his suction tubing.  We conversed without issue, he was on the trilogy ventilator, physician was at [name]'s bedside and discussed plan of care with Cameron Baker.  Cameron Baker had just finished the [employer] treatment and was finishing his inhaled medications.  [name] said he was going to rest and that he did not have any needs that I could address at this time.   I spoke with the assigned RT about adding humidity to the Trilogy circuit, she agreed that doing so may benefit [name].

## 2018-08-31 NOTE — Unmapped (Signed)
Social Work  Psychosocial Assessment    Patient Name: Cameron Baker   Medical Record Number: [id number]   Date of Birth: 09-08-1991  Sex: Male     Pt is a 27 y.o. male with a history of VATER syndrome with tracheobronchomalacia s/p PEG and tracheostomy with nocturnal ventilation c/b recurrent tracheobronchitis, Bipolar I disorder, asthma, and hypothyroidism that presented to [facility] with Dyspnea, likely either recurrent tracheobronchitis vs asthma exacerbation.    Pt has had multiple MPCU admissions, with his most recent ending on 9/17. Pt's readmission is sue to respiratory distress. Pt is on a Trilogy vent and has 56 hrs/week of PDN with [facility]-   [phone]. Pt states he provides self care when PDN not with him. Per pt, PDN and himself are his two vent caregivers, though PDN not always available.    Pt does have a diagnosis of Bipolar I Disorder, but says it is well managed with his medication and periodic visits to his psychiatrist; pt reports no problems obtaining medications.     Pt lives with a friend, but his EC and HCDM is his mother, Rontae [phone]. Pt plans to d/c back home, likely with needing a taxi voucher.    Referral  Referred by: Care Manager  Reason for Referral: Complex Discharge Planning    Extended Emergency Contact Information  Primary Emergency Contact: [name] [place]  Home Phone: [phone]  Mobile Phone: [phone]  Relation: Mother    Legal Next of Kin / Guardian / POA / Advance Directives    Advance Directive (Medical Treatment)  Does patient have an advance directive covering medical treatment?: Patient does not have advance directive covering medical treatment.(Pt stated he would like his mother, Estevan Kersh- [phone], to make healthcare decisions if pt cannot do so for himself.)  Information provided on advance directive:: No  Patient requests assistance:: No    Advance Directive (Mental Health Treatment)  Does patient have an advance directive covering mental health treatment?: Patient does not have advance directive covering mental health treatment.  Reason patient does not have an advance directive covering mental health treatment:: Patient does not wish to complete one at this time.    Discharge Planning  Discharge Planning Information:   Type of Residence   Physical Address:    [address] [place] [address]    Medical Information   Past Medical History:   Diagnosis Date   ??? ADHD    ??? Asthma    ??? Bipolar 1 disorder (CMS-HCC)    ??? Chronic tracheobronchitis (CMS-HCC)    ??? Constipation    ??? De Quervain's tenosynovitis    ??? Dysphagia    ??? Esophageal dysmotility    ??? Esophageal stricture    ??? GERD (gastroesophageal reflux disease)    ??? hypothyroid    ??? Seizures (CMS-HCC)    ??? Tracheobronchomalacia    ??? VATER syndrome    ??? Ventilator dependence (CMS-HCC)        Past Surgical History:   Procedure Laterality Date   ??? ANAL DILATION  01/21/2006   ??? ESOPHAGOGASTRODUODENOSCOPY      multiple   ??? HIP SURGERY Right 01/19/2017    shaved   ??? IR INSERT PORT AGE GREATER THAN 5 YRS  06/22/2018    IR INSERT PORT AGE GREATER THAN 5 YRS 06/22/2018 Andres Labrum, MD IMG [facility]   ??? LATERAL RECTUS RECESSION  11/15/2003   ??? NISSEN FUNDOPLICATION  2001  x 2 redos   ??? PORTACATH PLACEMENT  01/21/2006    taken out same year   ??? PR ESOPHAGEAL MOTILITY STUDY, MANOMETRY N/A 04/23/2018    Procedure: ESOPHAGEAL MOTILITY STUDY W/INT & REP;  Surgeon: Nurse-Based Giproc;  [facility]: [facility];  Service: Gastroenterology   ??? PR UP GI ENDOSCOPY,BALL DIL,30MM N/A 02/22/2018    Procedure: UGI ENDO; W/BALLOON DILAT ESOPHAGUS (<30MM DIAM);  Surgeon: Zetta Bills, MD;  Location: [facility];  Service: Gastroenterology   ??? PR UP GI ENDOSCOPY,BALL DIL,30MM N/A 05/20/2018    Procedure: UGI ENDO; W/BALLOON DILAT ESOPHAGUS (<30MM DIAM);  Surgeon: Rona Ravens, MD;  [facility]: [facility];  Service: Gastroenterology ??? PR UP GI ENDOSCOPY,BALL DIL,30MM N/A 08/06/2018    Procedure: UGI ENDO; W/BALLOON DILAT ESOPHAGUS (<30MM DIAM);  Surgeon: Annie Paras, MD;  [facility]: [facility];  Service: Gastroenterology   ??? REMOVAL VENOUS ACCESS PORT [facility] HISTORICAL RESULT)  11/03/2006   ??? STRABISMUS SURGERY Right    ??? TRACHEOSTOMY  1994   ??? WISDOM TOOTH EXTRACTION  04/2012   ??? WRIST SURGERY      3 times.        Family History   Adopted: Yes     Network engineer Insurance: Payor: Advertising copywriter MEDICARE ADV / Plan: UNITED HEALTHCARE DUAL COMPLETE HMO / Product Type: *No Product type* /    Secondary Insurance: Secondary Insurance  MEDICAID [place]   Prescription Coverage: Medicaid   Preferred Pharmacy: [facility] [phone] - [place], [place] - [address] AT [facility] [address]  [id number] - [place], [place] - [address]  [facility]  [facility]    Barriers to taking medication: No    Transition Home   Transportation at time of discharge: Taxi    Anticipated changes related to Illness: none   Services in place prior to admission: Home Based Services: Engineering geologist Nursing with [employer]  Home Medical Equipment: Triolgy vent   Services anticipated for DC: resume current services   Hemodialysis Prior to Admission: No    Readmission  Risk of Unplanned Readmission Score: UNPLANNED READMISSION SCORE: 74%  Readmitted Within the Last 30 Days? Yes  Readmission Factors include: previous discharge plan unsuccessful    Social Determinants of Health  Social Determinants of Health were addressed in provider documentation.  Please refer to patient history.    Social History  Support Systems: Family Members, Friends/Neighbors    Medical and Psychiatric History  Psychosocial Stressors: Coping with health challenges/recent hospitalization   Psychological Issues/Information: Mental illness   Concerns: Active mental illness concerns    Comment: Bipolar I Disorder, pt reports he is doing well on medication and seeing a psychiatrist  Chemical Dependency: None   Outpatient Providers: Primary Care Provider   Name / Contact #: : Gibson Ramp [phone]   Legal: No legal issues      Ability to Access Community Services: No issues accessing community services

## 2018-08-31 NOTE — Unmapped (Signed)
Care Management  Initial Transition Planning Assessment              General  Care Manager assessed the patient by : In person interview with patient, Medical record review, Discussion with Clinical Care team  Orientation Level: Oriented X4    Contact/Decision Maker        Extended Emergency Contact Information  Primary Emergency Contact: [name] [place] of [place]  Home Phone: [phone]  Mobile Phone: [phone]  Relation: Mother    Legal Next of Kin / Guardian / POA / Advance Directives       Advance Directive (Medical Treatment)  Does patient have an advance directive covering medical treatment?: Patient does not have advance directive covering medical treatment.  Reason patient does not have an advance directive covering medical treatment:: Patient does not wish to complete one at this time  Reason there is not a Health Care Decision Maker appointed:: Patient does not wish to appoint a Health Care Decision Maker at this time  Information provided on advance directive:: No  Patient requests assistance:: No    Advance Directive (Mental Health Treatment)  Does patient have an advance directive covering mental health treatment?: Patient does not have advance directive covering mental health treatment.  Reason patient does not have an advance directive covering mental health treatment:: Patient does not wish to complete one at this time.    Patient Information  Lives with: Friends    Type of Residence: Private residence    Type of Residence: Mailing Address:  [address] [place] [address]  Contacts:    Patient Phone Number: [phone] (home) [phone] (work)        Medical Provider(s): [name], MD  Reason for Admission: Admitting Diagnosis:  Shortness of breath [R06.02]  Past Medical History:   has a past medical history of ADHD, Asthma, Bipolar 1 disorder (CMS-HCC), Chronic tracheobronchitis (CMS-HCC), Constipation, De Quervain's tenosynovitis, Dysphagia, Esophageal dysmotility, Esophageal stricture, GERD (gastroesophageal reflux disease), hypothyroid, Seizures (CMS-HCC), Tracheobronchomalacia, VATER syndrome, and Ventilator dependence (CMS-HCC).  Past Surgical History:   has a past surgical history that includes Tracheostomy (1994); Hip surgery (Right, 01/19/2017); Wrist surgery; Nissen fundoplication (2001); Anal dilation (01/21/2006); Portacath placement (01/21/2006); REMOVAL VENOUS ACCESS PORT [facility] HISTORICAL RESULT) (11/03/2006); Wisdom tooth extraction (04/2012); Lateral rectus recession (11/15/2003); Strabismus surgery (Right); Esophagogastroduodenoscopy; pr up gi endoscopy,ball dil,7mm (N/A, 02/22/2018); pr esophageal motility study, manometry (N/A, 04/23/2018); pr up gi endoscopy,ball dil,34mm (N/A, 05/20/2018); IR Insert Port Age Greater Than 5 Years (06/22/2018); and pr up gi endoscopy,ball dil,4mm (N/A, 08/06/2018).   Previous admit date: 08/20/2018    Primary Insurance- Payor: Advertising copywriter MEDICARE ADV / Plan: UNITED HEALTHCARE DUAL COMPLETE HMO / Product Type: *No Product type* /   Secondary Insurance ??? Secondary Insurance  MEDICAID [place]  Prescription Coverage ???   Preferred Pharmacy - [facility] [phone] - [place], [place] - [address] AT [facility] OF [address]  [facility] [phone] - [place], [place] - [address]  [facility]  [place] [facility]    Transportation home: Private vehicle  Level of function prior to admission: Requires Assistance     Location/Detail: [address], [place] [address] phone [phone]    Support Systems: Family Members, Friends/Neighbors    Responsibilities/Dependents at home?: No    Home Care services in place prior to admission?: Yes  Type of Home Care services in place  prior to admission: Non-skilled home care (specify)  Current Home Care provider (Name/Phone #): [facility] [phone]    Outpatient/Community Resources in place prior to admission: Clinic  Agency detail (Name/Phone #): PCP: Merleen Milliner    Equipment Currently Used at Home: respiratory supplies  Current HME Agency (Name/Phone #): [facility] [place], portable suction [facility] port supplies [place]    Currently receiving outpatient dialysis?: No     Financial Information     Need for financial assistance?: No     Social Determinants of Health  Social Determinants of Health were addressed in provider documentation.  Please refer to patient history.    Discharge Needs Assessment  Concerns to be Addressed: care coordination/care conferences    Clinical Risk Factors: Multiple Diagnoses (Chronic), Functional Limitations, Lives Alone or Absence of Caregiver to Assist with Discharge and Home Care    Barriers to taking medications: Yes (Comment)    Prior overnight hospital stay or ED visit in last 90 days: Yes    Readmission Within the Last 30 Days: previous discharge plan unsuccessful    Anticipated Changes Related to Illness: none    Equipment Needed After Discharge: none    Discharge Facility/Level of Care Needs: (home with PDN)    Readmission  Risk of Unplanned Readmission Score: UNPLANNED READMISSION SCORE: 74%  Readmitted Within the Last 30 Days? Yes     Discharge Plan     Expected Discharge Date: 09/02/18    Expected Transfer from Critical Care: 09/01/18    Patient and/or family were provided with choice of facilities / services that are available and appropriate to meet post hospital care needs?: Yes   List choices in order highest to lowest preferred, if applicable. : [facility]    Initial Assessment complete?: Yes

## 2018-08-31 NOTE — Unmapped (Addendum)
Pt AAOx4, VSS in NSR-ST 130's, satting well on RA while awake and 30% trilogy while sleeping. Pt had episode of SOB and desatting to low 90's, paged MD who came to bedside, pt has since improved. C/o R sided rib pain, voltaren gel and lidocaine patch applied w/o relief, paged MD and oxycodone ordered and given. Pt also with c/o nausea this AM, PRN phenergan given. Mickey button intact with redness around site. Vitals and assessment done Qshift per orders. Fall and safety precautions maintained, will continue to monitor.     Problem: Adult Inpatient Plan of Care  Goal: Plan of Care Review  Outcome: Progressing  Goal: Patient-Specific Goal (Individualization)  Outcome: Progressing  Goal: Absence of Hospital-Acquired Illness or Injury  Outcome: Progressing  Goal: Optimal Comfort and Wellbeing  Outcome: Progressing  Goal: Readiness for Transition of Care  Outcome: Progressing  Goal: Rounds/Family Conference  Outcome: Progressing     Problem: Pain Chronic (Persistent) (Comorbidity Management)  Goal: Acceptable Pain Control and Functional Ability  Outcome: Progressing     Problem: Communication Impairment (Artificial Airway)  Goal: Effective Communication  Outcome: Progressing     Problem: Device-Related Complication Risk (Artificial Airway)  Goal: Optimal Device Function  Outcome: Progressing     Problem: Skin and Tissue Injury (Artificial Airway)  Goal: Absence of Device-Related Skin or Tissue Injury  Outcome: Progressing     Problem: Breathing Pattern Ineffective  Goal: Effective Breathing Pattern  Outcome: Progressing     Problem: Inability to Wean (Mechanical Ventilation, Invasive)  Goal: Mechanical Ventilation Liberation  Outcome: Progressing     Problem: Ventilator-Induced Lung Injury (Mechanical Ventilation, Invasive)  Goal: Absence of Ventilator-Induced Lung Injury  Outcome: Progressing

## 2018-08-31 NOTE — Unmapped (Addendum)
Pt is axxo4. St 120-130s, SBP WNL. Afebrile. Pt on vent at start of shift while sleeping. Around 10/1129 pt suctioned bloody secretions (new for pt), received metaneb and then continued to suction bloody secretions a few times. Pt off vent for approx 30 min but SOB (rhonic and audibly wheezy) so back on vent. Stat 12 lead EKG and CTA ordered. MD Elder Love at bedside to eval pt. Additionally, pt continues to c/o r flank/ribcage pain; MD assessed multiple times. Rn and RT transported pt to CTA to eval for PE.   When pt returned from Ct, pt continues to c/o R flank/ribcage pain. MD Regenia Skeeter paged and at bedside, stat IV morphine and stat labs drawn. Pt tender to touch. Pt was on TC 40-50% but left room on his own accord (on RA/passe-pt's baseline) and when primary and charge RN stopped him in hallway he was satting 76% but in no distress. Pt assisted back to room on TC 40%, sats 96%. MD paged and stated they would come ot bedside to assess resp and continued c/o pain.  Pt educated that d/t new O2 need, no walking without O2 and RN. Pt has inc suctioning needs q30-1hr. Pt amb to BR/uses urinal with adequate UOP. Falls precautions maintained. WCTM.   Problem: Adult Inpatient Plan of Care  Goal: Plan of Care Review  Outcome: Progressing  Goal: Patient-Specific Goal (Individualization)  Outcome: Progressing  Goal: Absence of Hospital-Acquired Illness or Injury  Outcome: Progressing  Goal: Optimal Comfort and Wellbeing  Outcome: Progressing  Goal: Readiness for Transition of Care  Outcome: Progressing  Goal: Rounds/Family Conference  Outcome: Progressing     Problem: Pain Chronic (Persistent) (Comorbidity Management)  Goal: Acceptable Pain Control and Functional Ability  Outcome: Progressing     Problem: Communication Impairment (Artificial Airway)  Goal: Effective Communication  Outcome: Progressing     Problem: Device-Related Complication Risk (Artificial Airway)  Goal: Optimal Device Function  Outcome: Progressing Problem: Skin and Tissue Injury (Artificial Airway)  Goal: Absence of Device-Related Skin or Tissue Injury  Outcome: Progressing     Problem: Breathing Pattern Ineffective  Goal: Effective Breathing Pattern  Outcome: Progressing     Problem: Inability to Wean (Mechanical Ventilation, Invasive)  Goal: Mechanical Ventilation Liberation  Outcome: Progressing     Problem: Ventilator-Induced Lung Injury (Mechanical Ventilation, Invasive)  Goal: Absence of Ventilator-Induced Lung Injury  Outcome: Progressing

## 2018-09-01 DIAGNOSIS — R0602 Shortness of breath: Principal | ICD-10-CM

## 2018-09-01 LAB — HEPATIC FUNCTION PANEL
ALBUMIN: 3.2 g/dL — ABNORMAL LOW (ref 3.5–5.0)
ALKALINE PHOSPHATASE: 42 U/L (ref 38–126)
ALT (SGPT): 25 U/L (ref 19–72)
BILIRUBIN DIRECT: <0.1 mg/dL (ref 0.00–0.40)
PROTEIN TOTAL: 5.9 g/dL — ABNORMAL LOW (ref 6.5–8.3)

## 2018-09-01 LAB — BASIC METABOLIC PANEL
ANION GAP: 4 mmol/L — ABNORMAL LOW (ref 9–15)
BLOOD UREA NITROGEN: 7 mg/dL (ref 7–21)
CALCIUM: 8.5 mg/dL (ref 8.5–10.2)
CHLORIDE: 101 mmol/L (ref 98–107)
CO2: 33 mmol/L — ABNORMAL HIGH (ref 22.0–30.0)
CREATININE: 0.58 mg/dL — ABNORMAL LOW (ref 0.70–1.30)
EGFR CKD-EPI NON-AA MALE: >90 mL/min/{1.73_m2} (ref >=60)
GLUCOSE RANDOM: 111 mg/dL (ref 65–179)
POTASSIUM: 4 mmol/L (ref 3.5–5.0)
SODIUM: 138 mmol/L (ref 135–145)

## 2018-09-01 LAB — MAGNESIUM
MAGNESIUM: 1.6 mg/dL (ref 1.6–2.2)
Magnesium:MCnc:Pt:Ser/Plas:Qn:: 1.6

## 2018-09-01 LAB — MEAN CORPUSCULAR HEMOGLOBIN CONC: Lab: 31.3

## 2018-09-01 LAB — CBC
HEMOGLOBIN: 9.7 g/dL — ABNORMAL LOW (ref 13.5–17.5)
MEAN CORPUSCULAR HEMOGLOBIN CONC: 31.3 g/dL (ref 31.0–37.0)
MEAN CORPUSCULAR HEMOGLOBIN: 23.9 pg — ABNORMAL LOW (ref 26.0–34.0)
MEAN CORPUSCULAR VOLUME: 76.3 fL — ABNORMAL LOW (ref 80.0–100.0)
MEAN PLATELET VOLUME: 7.5 fL (ref 7.0–10.0)
RED BLOOD CELL COUNT: 4.08 10*12/L — ABNORMAL LOW (ref 4.50–5.90)
RED CELL DISTRIBUTION WIDTH: 17.3 % — ABNORMAL HIGH (ref 12.0–15.0)
WBC ADJUSTED: 4.5 10*9/L (ref 4.5–11.0)

## 2018-09-01 LAB — ALBUMIN: Albumin:MCnc:Pt:Ser/Plas:Qn:: 3.2 — ABNORMAL LOW

## 2018-09-01 LAB — LACTATE BLOOD VENOUS: Lactate:SCnc:Pt:BldV:Qn:: 1.3

## 2018-09-01 LAB — BLOOD UREA NITROGEN: Urea nitrogen:MCnc:Pt:Ser/Plas:Qn:: 7

## 2018-09-01 NOTE — Unmapped (Signed)
Med G Daily Progress Note    Interval History/Subjective:  Reports increased shortness of breath, wheezing. Mild hemoptysis in secretions is new. Worsening R. Sided chest pain. Desaturated to 70s when walking but asymptomatic at that time.     Assessment/Plan:    Principal Problem:    Dyspnea  Active Problems:    Asthma    Hypothyroidism    Chronic tracheobronchitis (CMS-HCC)    Tracheostomy dependence (CMS-HCC)    VATER syndrome    PEG (percutaneous endoscopic gastrostomy) status (CMS-HCC)    History of home ventilator (CMS-HCC)    Bipolar 1 disorder (CMS-HCC)  Resolved Problems:    * No resolved hospital problems. *      Cameron Baker is a 27 y.o. male with a history of VATER syndrome with tracheobronchomalacia s/p PEG and tracheostomy with nocturnal ventilation c/b recurrent tracheobronchitis, Bipolar I disorder, asthma, and hypothyroidism that presented to [facility] with Dyspnea, likely either recurrent tracheobronchitis vs asthma exacerbation.  ??  Asthma Exacerbation: Most likely etiology given wheezing, and having recently completed antibiotic therapy for pneumonia with improvement on imaging.   - Sputum culture pending   - Continue Levaquin (9/21-9/27)   - Continue prednisone 40mg  daily (9/21-). Will do a 14 day course and discuss taper.  - Continue home nebulizers as below  - Albuterol prn   - Airway clearance     R. Sided chest pain, Mild Hemoptysis, Tachycardia   CTPE negative. No pneumothorax or tap-able effusion. Hemoptysis likely to be from bronchiectasis, coughing from pneumonia. Possible RUQ tenderness on exam but hepatic function normal and afebrile. History of renal stones but pain is moe anterior and patient reports it feels different than before. Per nursing, symptoms started after patient reported being told he would be discharged, but team was not aware of discharge plan and had not communicated this to patient.   - Pain control with lidocaine patch, prn morphine, oxycodone     VATER with associated tracheobronchomalacia s/p trach and PEG, on chronic nocturnal vent  - Continue aformoterol nebs BID  - Continue budesonide nebs BID  - Airway clearance with 3% HTS (with duonebs prior to HTS)  - Hold home azithromycin MWF while on levofloxacin   - Continue home Trilogy vent qhs (AVAPS, VT 525 ml, PEEP 10 cm H2O)  ??  Bipolar I: Continue home citalopram, valproate, mirtazapine, and lorazepam  Hypothyroidism: Continue home synthroid daily  Microcytic anemia: at baseline, no in hospital interventions.  Slow transit constipation: continued on??bentyl QID prn and daily miralax  Lower extremity muscle spasms: Continued on home??cyclobenzaprine prn  Acne:??Restarted home minocycline now that he is on these antibiotics.  ??  DVT ppx: lovenox   Code Status: Full Code  Dispo: Med G, stepdown given nocturnal vent  ___________________________________________________________________    Labs/Studies:  Labs and Studies from the last 24hrs per EMR and Reviewed    Objective:  Temp:  [36 ??C-37.3 ??C] 36.9 ??C  Heart Rate:  [111-126] 119  SpO2 Pulse:  [111-125] 115  Resp:  [14-24] 22  BP: (134-135)/(62-83) 135/62  FiO2 (%):  [21 %-40 %] 40 %  SpO2:  [90 %-98 %] 98 %, I/O this shift:  In: 200 [NG/GT:200]  Out: 1250 [Urine:1250],   Wt Readings from Last 3 Encounters:   08/27/18 (!) 103 kg (227 lb 1.2 oz)   08/20/18 (!) 103.9 kg (229 lb 0.9 oz)   08/06/18 95.7 kg (211 lb)       General: Obese young male, lying in bed  on trach collar, appears uncomfortable   NECK: trachea midline with tracheostomy in place  RESP: Coarse breath sounds bilaterally, with diffuse expiratory wheezing and rapid, shallow breathing   CV: Tachycardic. Regular rhythm.   Abdomen: No palpable mass or deformity. RUQ tenderness to palpation. Normal bowel sounds.

## 2018-09-01 NOTE — Unmapped (Signed)
Pt alert and oriented x4.  C/o right rib/chest pain up to 8/10 this shift.  Pain controlled with prn morphine and oxycodone alternating.  Pt reports s/o when up to br w/o o2. Sats noted to be 83% on room air on portable pulse ox.  Pt connected himself to vent for the night, at 30% fio2.  Sats remained wnl while on vent overnight.  Lactate redrawn from 3.5 to 1.3 after 2 liter boluses.  Pt glucose elevated on previous labs,  accuchecks and SSI started achs.  Pt completed abd ultrasound.  Prn phenergan given once for nausea.  Will cont to monitor.  Problem: Adult Inpatient Plan of Care  Goal: Plan of Care Review  Outcome: Ongoing - Unchanged  Goal: Patient-Specific Goal (Individualization)  Outcome: Ongoing - Unchanged  Goal: Absence of Hospital-Acquired Illness or Injury  Outcome: Ongoing - Unchanged  Goal: Optimal Comfort and Wellbeing  Outcome: Ongoing - Unchanged  Goal: Readiness for Transition of Care  Outcome: Ongoing - Unchanged  Goal: Rounds/Family Conference  Outcome: Ongoing - Unchanged     Problem: Pain Chronic (Persistent) (Comorbidity Management)  Goal: Acceptable Pain Control and Functional Ability  Outcome: Ongoing - Unchanged     Problem: Communication Impairment (Artificial Airway)  Goal: Effective Communication  Outcome: Ongoing - Unchanged     Problem: Device-Related Complication Risk (Artificial Airway)  Goal: Optimal Device Function  Outcome: Ongoing - Unchanged     Problem: Skin and Tissue Injury (Artificial Airway)  Goal: Absence of Device-Related Skin or Tissue Injury  Outcome: Ongoing - Unchanged     Problem: Breathing Pattern Ineffective  Goal: Effective Breathing Pattern  Outcome: Ongoing - Unchanged     Problem: Inability to Wean (Mechanical Ventilation, Invasive)  Goal: Mechanical Ventilation Liberation  Outcome: Ongoing - Unchanged     Problem: Ventilator-Induced Lung Injury (Mechanical Ventilation, Invasive)  Goal: Absence of Ventilator-Induced Lung Injury  Outcome: Ongoing - Unchanged

## 2018-09-02 DIAGNOSIS — R0602 Shortness of breath: Principal | ICD-10-CM

## 2018-09-02 LAB — CBC
HEMATOCRIT: 32.5 % — ABNORMAL LOW (ref 41.0–53.0)
HEMOGLOBIN: 10.2 g/dL — ABNORMAL LOW (ref 13.5–17.5)
MEAN CORPUSCULAR HEMOGLOBIN CONC: 31.3 g/dL (ref 31.0–37.0)
MEAN CORPUSCULAR HEMOGLOBIN: 23.3 pg — ABNORMAL LOW (ref 26.0–34.0)
MEAN PLATELET VOLUME: 6.9 fL — ABNORMAL LOW (ref 7.0–10.0)
PLATELET COUNT: 335 10*9/L (ref 150–440)
RED BLOOD CELL COUNT: 4.36 10*12/L — ABNORMAL LOW (ref 4.50–5.90)
RED CELL DISTRIBUTION WIDTH: 17.3 % — ABNORMAL HIGH (ref 12.0–15.0)
WBC ADJUSTED: 7.3 10*9/L (ref 4.5–11.0)

## 2018-09-02 LAB — BASIC METABOLIC PANEL
ANION GAP: 12 mmol/L (ref 9–15)
BLOOD UREA NITROGEN: 11 mg/dL (ref 7–21)
BUN / CREAT RATIO: 16
CALCIUM: 8.8 mg/dL (ref 8.5–10.2)
CHLORIDE: 98 mmol/L (ref 98–107)
CO2: 30 mmol/L (ref 22.0–30.0)
EGFR CKD-EPI AA MALE: >90 mL/min/{1.73_m2} (ref >=60)
EGFR CKD-EPI NON-AA MALE: >90 mL/min/{1.73_m2} (ref >=60)
GLUCOSE RANDOM: 101 mg/dL (ref 65–179)
POTASSIUM: 4.1 mmol/L (ref 3.5–5.0)
SODIUM: 140 mmol/L (ref 135–145)

## 2018-09-02 LAB — BLOOD GAS CRITICAL CARE PANEL, ARTERIAL
BASE EXCESS ARTERIAL: 3.6 — ABNORMAL HIGH (ref -2.0–2.0)
CALCIUM IONIZED ARTERIAL (MG/DL): 4.57 mg/dL (ref 4.40–5.40)
CALCIUM IONIZED ARTERIAL (MG/DL): 4.69 mg/dL (ref 4.40–5.40)
GLUCOSE WHOLE BLOOD: 178 mg/dL
GLUCOSE WHOLE BLOOD: 275 mg/dL
HEMOGLOBIN BLOOD GAS: 9.2 g/dL — ABNORMAL LOW (ref 13.50–17.50)
HEMOGLOBIN BLOOD GAS: 9.8 g/dL — ABNORMAL LOW (ref 13.50–17.50)
LACTATE BLOOD ARTERIAL: 2.4 mmol/L — ABNORMAL HIGH (ref <=1.2)
O2 SATURATION ARTERIAL: 95.8 % (ref 94.0–100.0)
O2 SATURATION ARTERIAL: 97.4 % (ref 94.0–100.0)
PCO2 ARTERIAL: 48 mmHg — ABNORMAL HIGH (ref 35.0–45.0)
PCO2 ARTERIAL: 52.5 mmHg — ABNORMAL HIGH (ref 35.0–45.0)
PH ARTERIAL: 7.36 (ref 7.35–7.45)
PH ARTERIAL: 7.41 (ref 7.35–7.45)
PO2 ARTERIAL: 83.8 mmHg (ref 80.0–110.0)
PO2 ARTERIAL: 86.5 mmHg (ref 80.0–110.0)
POTASSIUM WHOLE BLOOD: 4.1 mmol/L (ref 3.4–4.6)
SODIUM WHOLE BLOOD: 135 mmol/L (ref 135–145)
SODIUM WHOLE BLOOD: 139 mmol/L (ref 135–145)

## 2018-09-02 LAB — BLOOD GAS CRITICAL CARE PANEL, VENOUS
GLUCOSE WHOLE BLOOD: 288 mg/dL
HCO3 VENOUS: 30 mmol/L — ABNORMAL HIGH (ref 22–27)
HEMOGLOBIN BLOOD GAS: 9.9 g/dL — ABNORMAL LOW (ref 13.50–17.50)
LACTATE BLOOD VENOUS: 2.1 mmol/L — ABNORMAL HIGH (ref 0.5–1.8)
O2 SATURATION VENOUS: 70.9 % (ref 40.0–85.0)
PCO2 VENOUS: 60 mmHg (ref 40–60)
PH VENOUS: 7.33 (ref 7.32–7.43)
PO2 VENOUS: 43 mmHg (ref 30–55)
POTASSIUM WHOLE BLOOD: 4.3 mmol/L (ref 3.4–4.6)
SODIUM WHOLE BLOOD: 134 mmol/L — ABNORMAL LOW (ref 135–145)

## 2018-09-02 LAB — O2 SATURATION VENOUS: Oxygen saturation:MFr:Pt:BldV:Qn:: 70.9

## 2018-09-02 LAB — BLOOD UREA NITROGEN: Urea nitrogen:MCnc:Pt:Ser/Plas:Qn:: 11

## 2018-09-02 LAB — MEAN CORPUSCULAR HEMOGLOBIN: Lab: 23.3 — ABNORMAL LOW

## 2018-09-02 LAB — MAGNESIUM: Magnesium:MCnc:Pt:Ser/Plas:Qn:: 1.4 — ABNORMAL LOW

## 2018-09-02 LAB — HCO3 ARTERIAL: Bicarbonate:SCnc:Pt:BldA:Qn:: 30 — ABNORMAL HIGH

## 2018-09-02 LAB — LACTATE BLOOD VENOUS
Lactate:SCnc:Pt:BldV:Qn:: 3.4 — ABNORMAL HIGH
Lactate:SCnc:Pt:BldV:Qn:: 4 — ABNORMAL HIGH

## 2018-09-02 LAB — LACTATE BLOOD ARTERIAL: Lactate:SCnc:Pt:BldA:Qn:: 2.1 — ABNORMAL HIGH

## 2018-09-02 LAB — PCO2 ARTERIAL: Carbon dioxide:PPres:Pt:BldA:Qn:: 52.5 — ABNORMAL HIGH

## 2018-09-02 LAB — TROPONIN I: Troponin I.cardiac:MCnc:Pt:Ser/Plas:Qn:: <0.034

## 2018-09-02 NOTE — Unmapped (Signed)
Med G Daily Progress Note    Interval History/Subjective:  Continuing to complain of 8/10 pain in R. Flank and trace hemoptysis. Received morphine overnight.     Assessment/Plan:    Principal Problem:    Dyspnea  Active Problems:    Asthma    Hypothyroidism    Chronic tracheobronchitis (CMS-HCC)    Tracheostomy dependence (CMS-HCC)    VATER syndrome    PEG (percutaneous endoscopic gastrostomy) status (CMS-HCC)    History of home ventilator (CMS-HCC)    Bipolar 1 disorder (CMS-HCC)  Resolved Problems:    * No resolved hospital problems. *      Cameron Baker is a 27 y.o. male with a history of VATER syndrome with tracheobronchomalacia s/p PEG and tracheostomy with nocturnal ventilation c/b recurrent tracheobronchitis, Bipolar I disorder, asthma, and hypothyroidism that presented to [facility] with Dyspnea, likely either recurrent tracheobronchitis vs asthma exacerbation.  ??  Asthma Exacerbation: Most likely etiology given wheezing, and having recently completed antibiotic therapy for pneumonia with improvement on imaging.   - Sputum culture w/ mixed gram positive and gram negative organisms  - Continue Levaquin (9/21-9/27)   - Continue prednisone 40mg  daily (9/21-). Will do a 14 day course and discuss taper.  - Continue LABA/ICS as below  - Scheduled atrovent, prn albuterol     R. Flank pain, Tachycardia   CTPE negative. No pneumothorax or effusion. Trace hemoptysis likely from bronchiectasis or coughing from tracheobronchitis. US abdomen with punctate kidney stones and patient reports a history of kidney stones. However stones are non-obstructing, UA without hematuria and symptoms started after patient reported being told he would be discharged. Patient has a history of frequent admissions and anxiety surrounding discharge.  - Pain control with scheduled tylenol, lidocaine patch, prn diclofenac and oxycodone   - Terazosin as tamsulosin cannot be crushed through g tube    VATER with associated tracheobronchomalacia s/p trach and PEG, on chronic nocturnal vent  - Continue LABA/ICS  - Airway clearance   - Hold home azithromycin MWF while on levofloxacin   - Continue home Trilogy vent qhs (AVAPS, VT 525 ml, PEEP 10 cm H2O)  ??  Bipolar I: Continue home citalopram, valproate, mirtazapine, and lorazepam  Hypothyroidism: Continue home synthroid daily  Microcytic anemia: at baseline, no in hospital interventions.  Slow transit constipation: continued on??bentyl QID prn and daily miralax  Lower extremity muscle spasms: Continued on home??cyclobenzaprine prn  Acne:??Home minocycline   ??  DVT ppx: lovenox   Code Status: Full Code  Dispo: Med G, stepdown given nocturnal vent  ___________________________________________________________________    Labs/Studies:  Labs and Studies from the last 24hrs per EMR and Reviewed    Objective:  Temp:  [36.7 ??C-37.1 ??C] 37 ??C  Heart Rate:  [80-113] 108  SpO2 Pulse:  [73-107] 107  Resp:  [9-18] 14  BP: (122-157)/(69-81) 132/81  FiO2 (%):  [30 %-40 %] 40 %  SpO2:  [94 %-100 %] 94 %, No intake/output data recorded.,   Wt Readings from Last 3 Encounters:   08/27/18 (!) 103 kg (227 lb 1.2 oz)   08/20/18 (!) 103.9 kg (229 lb 0.9 oz)   08/06/18 95.7 kg (211 lb)       General: Obese young male, lying in bed on trach collar, appears comfortable  NECK: trachea midline with tracheostomy in place  RESP: Coarse breath sounds bilaterally, with diffuse expiratory wheezing and rapid, shallow breathing   CV: Tachycardic. Regular rhythm.   Abdomen: No palpable mass or deformity. RUQ  tenderness to palpation. Normal bowel sounds. CVA tenderness.

## 2018-09-02 NOTE — Unmapped (Addendum)
Pt was alert/drowsy his usual morning MS at change of shift, by 1000 was more drowsy and difficult to arouse/keep awake. HR 120s, BPs closely monitored, trending down to low 100s. Febrile 39.2. Bladder scan showed . MDs and Rapid aware of patient. Additional bolus administered, IV abx, and trending lactate/ABG. At 1200, bladder scan >500, so woke patient up and told him he needed to try to void or else would need I/O cath. Pt woke up, and was able to stay awake, stand, void, and get back into bed. Pt was reoriented, couldn't remember last night's events or AM events. Pt stated he was hungry and wanted to eat. Waited 10 minutes to make sure pt was staying awake, which he was and MD came to assess. Diet re-ordered and pain medication administered. Pt requesting to go to gift shop with RN and appears better. No longer having increased WOB, HR 110s, BP stable, remains on vent or trach collar at 50%. Temp 37.8. Will continue to monitor for s/s of infection. No further complaints or requests. Will continue to monitor and assess.    Addendum- pt improved in afternoon back to MS baseline, can't remember much of today/last night. Went down to gift shop on 50% TC and wanted to use wheelchair back 2/2 fatigue (not desat), so similar to his fatigue yesterday. Pt can't find his speaking valve so notified MD, speech consult in. Took off vent at one point to grab something off his table which he had to stand to get to and desat to 60s, recovered quickly when replaced on vent. Pt instructed to ask for assistance at this time since he is not at baseline respiratory status.         Problem: Adult Inpatient Plan of Care  Goal: Plan of Care Review  Outcome: Not Progressing  Goal: Patient-Specific Goal (Individualization)  Outcome: Not Progressing  Goal: Absence of Hospital-Acquired Illness or Injury  Outcome: Not Progressing  Goal: Optimal Comfort and Wellbeing  Outcome: Not Progressing  Goal: Readiness for Transition of Care Outcome: Not Progressing  Goal: Rounds/Family Conference  Outcome: Not Progressing     Problem: Pain Chronic (Persistent) (Comorbidity Management)  Goal: Acceptable Pain Control and Functional Ability  Outcome: Not Progressing     Problem: Communication Impairment (Artificial Airway)  Goal: Effective Communication  Outcome: Not Progressing     Problem: Device-Related Complication Risk (Artificial Airway)  Goal: Optimal Device Function  Outcome: Not Progressing     Problem: Skin and Tissue Injury (Artificial Airway)  Goal: Absence of Device-Related Skin or Tissue Injury  Outcome: Not Progressing     Problem: Breathing Pattern Ineffective  Goal: Effective Breathing Pattern  Outcome: Not Progressing     Problem: Inability to Wean (Mechanical Ventilation, Invasive)  Goal: Mechanical Ventilation Liberation  Outcome: Not Progressing     Problem: Ventilator-Induced Lung Injury (Mechanical Ventilation, Invasive)  Goal: Absence of Ventilator-Induced Lung Injury  Outcome: Not Progressing

## 2018-09-02 NOTE — Unmapped (Signed)
Pt AOx4, VSS. Walked around unit x2 on 50% trach mask, no issues with desat. Walked down to gift shop with [employer] fund[employer]. Was fatigued from this so returned with wheelchair, but no issues with desat. Pain managed through PRN medications. Nausea managed through PRN medications. BM x1 today. Adequate UO. No further complaints or requests. Will continue to monitor and assess.      Problem: Adult Inpatient Plan of Care  Goal: Plan of Care Review  Outcome: Ongoing - Unchanged  Goal: Patient-Specific Goal (Individualization)  Outcome: Ongoing - Unchanged  Goal: Absence of Hospital-Acquired Illness or Injury  Outcome: Ongoing - Unchanged  Goal: Optimal Comfort and Wellbeing  Outcome: Ongoing - Unchanged  Goal: Readiness for Transition of Care  Outcome: Ongoing - Unchanged  Goal: Rounds/Family Conference  Outcome: Ongoing - Unchanged     Problem: Pain Chronic (Persistent) (Comorbidity Management)  Goal: Acceptable Pain Control and Functional Ability  Outcome: Ongoing - Unchanged     Problem: Communication Impairment (Artificial Airway)  Goal: Effective Communication  Outcome: Ongoing - Unchanged     Problem: Device-Related Complication Risk (Artificial Airway)  Goal: Optimal Device Function  Outcome: Ongoing - Unchanged     Problem: Skin and Tissue Injury (Artificial Airway)  Goal: Absence of Device-Related Skin or Tissue Injury  Outcome: Ongoing - Unchanged     Problem: Breathing Pattern Ineffective  Goal: Effective Breathing Pattern  Outcome: Ongoing - Unchanged     Problem: Inability to Wean (Mechanical Ventilation, Invasive)  Goal: Mechanical Ventilation Liberation  Outcome: Ongoing - Unchanged     Problem: Ventilator-Induced Lung Injury (Mechanical Ventilation, Invasive)  Goal: Absence of Ventilator-Induced Lung Injury  Outcome: Ongoing - Unchanged

## 2018-09-02 NOTE — Unmapped (Signed)
Pt desat overnight. RN called RT for assessment and suction. Pt possible aspiration. RN paged MD. MD at beside. Pt on vent overnight. Pt self trach care. Pt tolerated breathing treatment and airway clearance with no complication. Wean as tolerated. Will continue to monitor.   Problem: Communication Impairment (Artificial Airway)  Goal: Effective Communication  Outcome: Ongoing - Unchanged     Problem: Device-Related Complication Risk (Artificial Airway)  Goal: Optimal Device Function  Outcome: Ongoing - Unchanged     Problem: Skin and Tissue Injury (Artificial Airway)  Goal: Absence of Device-Related Skin or Tissue Injury  Outcome: Ongoing - Unchanged     Problem: Inability to Wean (Mechanical Ventilation, Invasive)  Goal: Mechanical Ventilation Liberation  Outcome: Ongoing - Unchanged     Problem: Ventilator-Induced Lung Injury (Mechanical Ventilation, Invasive)  Goal: Absence of Ventilator-Induced Lung Injury  Outcome: Ongoing - Unchanged

## 2018-09-02 NOTE — Unmapped (Signed)
Pt initially A&O, St, VSS on 30-40% trach mask. Pt easily becomes breathless and tachypnic. Pt ambulated around unit on 50% 12L Trach mask, with frequent breaks. After ambulating, pt became dizzy at his bedside while trying to urinate. When pt called out stating he was dizzy, RN to bedside. Pt endorsed a feeling of the room spinning and loopyness RN placed pt on the vent 42%, checked blood sugar (wnl), Pt endorsed a lot of anxiety about feeling like he could not breathe and being dizzy. RN notified Md. MD to bedside to assess pt. ABG drawn. MD ordered chest xray, 1L Ns, ativan 1mg , as well as, medications for a migraine the pt felt the dizziness was causing (Reglan IV and benadryl Po). Medications given. Around 0130, RN noted pt to be desaturating on the monitor. RN to bedside, pt placed on 100% O2, Pt very drowsy, RN noted what looked like vomit in vent circuit and dribbling out of pt's mouth. RN suctioned pt's trach and mouth. RN called Rt, notified Md. MD to bedside, Pt made NPO for apparent aspiration event. Additional chest xray obtained. Pt required frequent suctioning for the rest of the shift to clear airway and maintained sats above 89. WCTM.      Problem: Adult Inpatient Plan of Care  Goal: Plan of Care Review  Outcome: Ongoing - Unchanged  Goal: Patient-Specific Goal (Individualization)  Outcome: Ongoing - Unchanged  Goal: Absence of Hospital-Acquired Illness or Injury  Outcome: Ongoing - Unchanged  Goal: Optimal Comfort and Wellbeing  Outcome: Ongoing - Unchanged  Goal: Readiness for Transition of Care  Outcome: Ongoing - Unchanged  Goal: Rounds/Family Conference  Outcome: Ongoing - Unchanged     Problem: Pain Chronic (Persistent) (Comorbidity Management)  Goal: Acceptable Pain Control and Functional Ability  Outcome: Ongoing - Unchanged     Problem: Communication Impairment (Artificial Airway)  Goal: Effective Communication  Outcome: Ongoing - Unchanged     Problem: Device-Related Complication Risk (Artificial Airway)  Goal: Optimal Device Function  Outcome: Ongoing - Unchanged     Problem: Skin and Tissue Injury (Artificial Airway)  Goal: Absence of Device-Related Skin or Tissue Injury  Outcome: Ongoing - Unchanged     Problem: Breathing Pattern Ineffective  Goal: Effective Breathing Pattern  Outcome: Ongoing - Unchanged     Problem: Inability to Wean (Mechanical Ventilation, Invasive)  Goal: Mechanical Ventilation Liberation  Outcome: Ongoing - Unchanged     Problem: Ventilator-Induced Lung Injury (Mechanical Ventilation, Invasive)  Goal: Absence of Ventilator-Induced Lung Injury  Outcome: Ongoing - Unchanged

## 2018-09-03 DIAGNOSIS — R0602 Shortness of breath: Principal | ICD-10-CM

## 2018-09-03 LAB — URINALYSIS
BILIRUBIN UA: NEGATIVE
BLOOD UA: NEGATIVE
GLUCOSE UA: 150 — AB
LEUKOCYTE ESTERASE UA: NEGATIVE
NITRITE UA: NEGATIVE
PH UA: 6 (ref 5.0–9.0)
PROTEIN UA: 30 — AB
RBC UA: <1 /HPF (ref <=3)
SPECIFIC GRAVITY UA: 1.029 (ref 1.003–1.030)
SQUAMOUS EPITHELIAL: <1 /HPF (ref 0–5)
UROBILINOGEN UA: 0.2
WBC UA: 1 /HPF (ref <=2)

## 2018-09-03 LAB — CANNABINOID SCREEN URINE: Lab: <20

## 2018-09-03 LAB — TOXICOLOGY SCREEN, URINE
BARBITURATE SCREEN URINE: <200
BENZODIAZEPINE SCREEN, URINE: <200
CANNABINOID SCREEN URINE: <20
METHADONE SCREEN, URINE: <300

## 2018-09-03 LAB — LEUKOCYTE ESTERASE UA: Lab: NEGATIVE

## 2018-09-03 LAB — COMPREHENSIVE METABOLIC PANEL
ALBUMIN: 3.4 g/dL — ABNORMAL LOW (ref 3.5–5.0)
ALT (SGPT): 32 U/L (ref 19–72)
ANION GAP: 8 mmol/L (ref 7–15)
AST (SGOT): 20 U/L (ref 19–55)
BILIRUBIN TOTAL: 0.5 mg/dL (ref 0.0–1.2)
BLOOD UREA NITROGEN: 17 mg/dL (ref 7–21)
BUN / CREAT RATIO: 21
CALCIUM: 8.7 mg/dL (ref 8.5–10.2)
CHLORIDE: 96 mmol/L — ABNORMAL LOW (ref 98–107)
CO2: 31 mmol/L — ABNORMAL HIGH (ref 22.0–30.0)
CREATININE: 0.81 mg/dL (ref 0.70–1.30)
EGFR CKD-EPI AA MALE: >90 mL/min/{1.73_m2} (ref >=60)
EGFR CKD-EPI NON-AA MALE: >90 mL/min/{1.73_m2} (ref >=60)
GLUCOSE RANDOM: 256 mg/dL — ABNORMAL HIGH (ref 65–179)
PROTEIN TOTAL: 6.2 g/dL — ABNORMAL LOW (ref 6.5–8.3)
SODIUM: 135 mmol/L (ref 135–145)

## 2018-09-03 LAB — MAGNESIUM: Magnesium:MCnc:Pt:Ser/Plas:Qn:: 1.8

## 2018-09-03 LAB — CBC
HEMATOCRIT: 30.7 % — ABNORMAL LOW (ref 41.0–53.0)
MEAN CORPUSCULAR HEMOGLOBIN: 24.2 pg — ABNORMAL LOW (ref 26.0–34.0)
MEAN CORPUSCULAR VOLUME: 75.2 fL — ABNORMAL LOW (ref 80.0–100.0)
MEAN PLATELET VOLUME: 7.1 fL (ref 7.0–10.0)
RED BLOOD CELL COUNT: 4.09 10*12/L — ABNORMAL LOW (ref 4.50–5.90)
RED CELL DISTRIBUTION WIDTH: 17.2 % — ABNORMAL HIGH (ref 12.0–15.0)
WBC ADJUSTED: 8.2 10*9/L (ref 4.5–11.0)

## 2018-09-03 LAB — EGFR CKD-EPI AA MALE: Lab: >90

## 2018-09-03 LAB — AMMONIA: Ammonia:SCnc:Pt:Plas:Qn:: 14

## 2018-09-03 LAB — MEAN CORPUSCULAR VOLUME: Lab: 75.2 — ABNORMAL LOW

## 2018-09-03 LAB — VALPROIC ACID TOTAL: Valproate:MCnc:Pt:Ser/Plas:Qn:: 40.6 — ABNORMAL LOW

## 2018-09-03 NOTE — Unmapped (Signed)
Med G Daily Progress Note    Interval History/Subjective:  Rapid response called overnight for somnolence, chest pain. EKG showed no abnormality. Troponin wnl. Exam notable for LUE jerks concerning for myoclonus, however these resolved quickly. In AM he was alert and following commands.     Assessment/Plan:    Principal Problem:    Dyspnea  Active Problems:    Asthma    Hypothyroidism    Chronic tracheobronchitis (CMS-HCC)    Tracheostomy dependence (CMS-HCC)    VATER syndrome    PEG (percutaneous endoscopic gastrostomy) status (CMS-HCC)    History of home ventilator (CMS-HCC)    Bipolar 1 disorder (CMS-HCC)  Resolved Problems:    * No resolved hospital problems. *      Cameron Baker is a 27 y.o. 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 Current hospital course c/b sepsis in the setting of possible mucus plugging or aspiration.    Somnolence (resolved)  CT head with no acute abnormalities. Irritable this morning but followed commands. Symptoms had resolved by the afternoon. Possibly due to centrally acting medication vs. Factitious disorder. Patient has expressed great anxiety surrounding discharge and when asked how long he thinks he might need hospitalization, he responds with 2 weeks. Somnolence appeared willful this morning with patient shutting eyes forcefully and turning away from examiner in response to greeting. States he lives with a friend and things are fine at home.   - D/c'd phenergan, decreased oxycodone to 5mg  q6h and changed indication to severe pain. Avoid further ativan.     Sepsis- c/f for pulmonary source in the setting of known mucus plugging and possible aspiration event. S/p IVF resuscitation and given broad spectrum abx. Now clinically stable but still with oxygen requirement.  - D/c'd meropenem. Resumed cefepime  - F/u blood and LRcx  - Repeat CXR 09/28  ??  Asthma Exacerbation: Most likely etiology given wheezing, and having recently completed antibiotic therapy for pneumonia with improvement on imaging.   - Sputum culture w/ mixed gram positive and gram negative organisms  - Continue prednisone 40mg  daily (9/21-). Will do a 14 day course and discuss taper.  - Continue LABA/ICS as below  - Scheduled atrovent, prn albuterol     Back pain  Paraspinal tenderness on exam causing severe discomfort. Has been bed-bound.  - Physical therapy consulted  - Pain control with scheduled tylenol, prn ibuprofen for mild/moderate pain, prn oxycodone 5mg      VATER with associated tracheobronchomalacia s/p trach and PEG, on chronic nocturnal vent  - Continue LABA/ICS  - Airway clearance   - Hold home azithromycin MWF while on antibiotics  - Continue home Trilogy vent qhs (AVAPS, VT 525 ml, PEEP 10 cm H2O)  ??  Bipolar I: Continue home citalopram, valproate, mirtazapine, and lorazepam  Hypothyroidism: Continue home synthroid daily  Microcytic anemia: at baseline, no in hospital interventions.  Slow transit constipation: continued on??bentyl QID prn and daily miralax + SMOG enemas   Lower extremity muscle spasms: Continued on home??cyclobenzaprine prn  Acne:??Home minocycline   ??  DVT ppx: lovenox   Code Status: Full Code  Dispo: Med G, stepdown given nocturnal vent  ___________________________________________________________________    Labs/Studies:  Labs and Studies from the last 24hrs per EMR and Reviewed    Objective:  Temp:  [36.9 ??C-37.5 ??C] 37.4 ??  C  Heart Rate:  [98-124] 98  SpO2 Pulse:  [105-122] 105  Resp:  [9-46] 16  BP: (125-164)/(49-95) 141/49  FiO2 (%):  [45 %-55 %] 55 %  SpO2:  [68 %-100 %] 98 %, I/O this shift:  In: 120 [NG/GT:120]  Out: - ,   Wt Readings from Last 3 Encounters:   08/27/18 (!) 103 kg (227 lb 1.2 oz)   08/20/18 (!) 103.9 kg (229 lb 0.9 oz)   08/06/18 95.7 kg (211 lb)       General: Obese young male, lying in bed on vent, sitting up  Neuro: Alert and oriented x 3.  NECK: trachea midline with tracheostomy in place  RESP: Coarse breath sounds bilaterally. No wheezing.  CV: Normal rate, regular rhythm., warm extremities, 2+ radial pulsus  Abdomen: No palpable mass or deformity. RUQ tenderness to palpation. Normal bowel sounds.

## 2018-09-03 NOTE — Unmapped (Signed)
Pt initially A&O, VSS on 45-50% vent. Pt c/o pain in ribs. Around 2200, pt called out c/o a vise like pain in his chest and along his diaphragm. MD notified. EKG and Chest xray obtained. Pt with low grade fever at this time. While these tests were being run, pt became progressively more drowsy. Around 2330, Rapid nurses consulted for RN concern with pt's almost somnolent state. Rapid Response called for this, see rapid notes. Pt became more response around 0015, when RN straight cathed pt for a urine sample. Pt transported to CT with rapid nurses. Pt then returned to baseline neuro status. At this point, pt c/o pain again, pt informed that no narcotics would be given for the rest of this shift, d/t concern with his mental status, pt became distressed upon hearing this and was difficult to calm. After crying, pt disconnected himself from the vent in order to use the inline catheter to go up his nose and suction snot. RN informed that this is not acceptable behavior, vent circuit changed, Tissues provided. SMOG enema given for the possibility that pain could be related to stool burden. Pt able to ambulate to [facility] for Bm. At this point, pt is on 55% vent, St, afebrile, easily arousable. All precautions maintained. Will continue to monitor.      Problem: Inability to Wean (Mechanical Ventilation, Invasive)  Goal: Mechanical Ventilation Liberation  Outcome: Not Progressing     Problem: Adult Inpatient Plan of Care  Goal: Plan of Care Review  Outcome: Ongoing - Unchanged  Goal: Patient-Specific Goal (Individualization)  Outcome: Ongoing - Unchanged  Goal: Absence of Hospital-Acquired Illness or Injury  Outcome: Ongoing - Unchanged  Goal: Optimal Comfort and Wellbeing  Outcome: Ongoing - Unchanged  Goal: Readiness for Transition of Care  Outcome: Ongoing - Unchanged  Goal: Rounds/Family Conference  Outcome: Ongoing - Unchanged     Problem: Pain Chronic (Persistent) (Comorbidity Management)  Goal: Acceptable Pain Control and Functional Ability  Outcome: Ongoing - Unchanged     Problem: Communication Impairment (Artificial Airway)  Goal: Effective Communication  Outcome: Ongoing - Unchanged     Problem: Device-Related Complication Risk (Artificial Airway)  Goal: Optimal Device Function  Outcome: Ongoing - Unchanged     Problem: Skin and Tissue Injury (Artificial Airway)  Goal: Absence of Device-Related Skin or Tissue Injury  Outcome: Ongoing - Unchanged     Problem: Breathing Pattern Ineffective  Goal: Effective Breathing Pattern  Outcome: Ongoing - Unchanged     Problem: Ventilator-Induced Lung Injury (Mechanical Ventilation, Invasive)  Goal: Absence of Ventilator-Induced Lung Injury  Outcome: Ongoing - Unchanged     Problem: Self-Care Deficit  Goal: Improved Ability to Complete Activities of Daily Living  Outcome: Ongoing - Unchanged

## 2018-09-03 NOTE — Unmapped (Signed)
Initial Consult Note        Requesting Attending Physician:  Dellis Filbert, MD  Service Requesting Consult: Pulmonology (MDG)     Assessment and Plan          Cameron Baker is a 27 y.o.male with history of VATER syndrome, tracheobronchomalacia, bipolar I disorder, PNES on whom I have been asked by Dellis Filbert, MD to consult for episodes of drowsiness.    Patient has had two episodes of drowsiness/excessive somnolence, first occurring the morning of 9/26 and the second the evening of 9/26. Second event in the evening accompanied by ?brief twitch in the LUE (unclear what this may represent, per provider who had seen the movement it appeared consistent with myoclonus which more often is related to toxic-metabolic encephalopathy and/or medication side effect, among other causes). Overall, suspect toxic metabolic encephalopathy in light of new infection, with likely contribution from medication side effect (given escalating narcotic use in the setting of use of other centrally acting medications). He is currently on depakote for mood stabilization, he reports having been diagnosed with pseudoseizures several years ago, which consisted of staring off though according to him he has not had excess periods of drowsiness.    RECOMMENDATIONS:  1. Check ammonia, UA.  2. Minimize use of centrally acting medications, if possible (namely narcotics).  3. Do not feel need for EEG at this time, may reassess pending clinical course.    Recommendations discussed with patient and primary team. Dr. Lucina Mellow was available. Please page the adult neurology consult pager [phone]) with any questions. We will continue to follow along with interest.    -----    [name], MD  PGY-4 - Neurology  [facility], [place], [place]    Attending Note Addendum    I was available.    [name], MD          HPI        Reason for Consult: episode of somnolence    Cameron Baker is a 27 y.o. male on whom I have been asked by Dellis Filbert, MD to consult for episode of somnolence.    Patient was admitted on 08/27/2018 for tracheitis versus asthma exacerbation after presenting with dyspnea, with hospital course complicated by sepsis related to suspected aspiration that developed the evening of 9/25 going into the morning of 9/26. He had a hypoxic event the evening of 9/25 while ambulating the halls. He at one point also developed increased secretions, with relative hypotension for which fluids and antibiotics were given around 0700. Per nursing documentation from earlier on 9/26, he was at his normal mental state at change of shift, but was noted be drowsy and difficult to awake around 10am. He was febrile at the time as well. Later in the day he became more awake, and was following commands (went down to the gift shop). By the afternoon he was back to baseline, though had poor recollection of the morning as well as the evening prior.    He was still mentating at his baseline this evening, though around 10pm was again noted to be drowsy again (in the midst of an enema) in that he was sleeping, not waking up and not following commands. The patient does not recall this. No abnormal movements seen. A rapid response was later activated around 11:30pm given persistent lethargy, blood work and CTH obtained and the patient gradually improved and was back to his baseline by 12:30am. A few providers possibly had  also seen a twitch of the left upper extremity, though per nursing this was not rhythmical or otherwise clinically significant. On my evaluation, he did not recall the events of the evening or the events of the rapid response. He had no complaints other than severe rib pain.    He has not had similar spells in the past (according to him). He reports having been diagnosed with pseudoseizures several years ago, those spells consisted of staring off. He has not had one in years. He is on depakote for mood stabilization.    Per review of MAR, he received the following notable medications:  - PRN flexeril given at 0635 on 9/26  - Cefepime 3 doses starting 9/26 (first 854)  - Lorazepam 1mg  0004 on 9/27  - Lorazepam 0.5mg  BID (since 9/21)  - Oxycodone 5mg  at 2024; 10mg  at 1316, 5mg  at 1700, 5mg  at 2140 (had been given total of 20mg  on 9/25, 10mg  on 9/24)  - Phenergan 12.5mg  IV at 0649, 1425 on 9/26    Also on gabapentin, depakote, mirtazepine, citalopram with unchanged dosing (and no missed doses)    PAST MEDICAL HISTORY:  Asthma  GERD  VATER syndrome s/p trach  Bipolar 1 disorder  ADHD  Tracheobronchomalacia  Hypothyroidism    SURGICAL HISTORY:  Tracheostomy  Hip surgery  Nissen fundoplication  Widsom teeth extraction  Eye surgery for strabismus    FAMILY HISTORY:  No family history of neurological disorders    SOCIAL HISTORY:  Lives in [place], [place] with a friend  Mother lives in [place]  Non-smoker  No substance use    ALLERGIES:  Allergies   Allergen Reactions   ??? Xopenex [Levalbuterol Hcl] Other (See Comments)     11/25/17: Bronchospasm with levalbuterol; the patient reports that he is able to tolerate albuterol without issues   ??? Allopurinol Analogues    ??? Versed [Midazolam]        MEDICATIONS:  Current Facility-Administered Medications   Medication Dose Route Frequency Provider Last Rate Last Dose   ??? acetaminophen (TYLENOL) solution 1,000 mg  1,000 mg G-tube Once Kai Levins, MD       ??? acetaminophen (TYLENOL) tablet 1,000 mg  1,000 mg G-tube Q8H Neeharika Nandam, MD   1,000 mg at 09/02/18 2024   ??? albuterol 2.5 mg /3 mL (0.083 %) nebulizer solution 2.5 mg  2.5 mg Nebulization Q6H PRN Reinaldo Berber, MD   2.5 mg at 09/02/18 1910   ??? albuterol 2.5 mg /3 mL (0.083 %) nebulizer solution 2.5 mg  2.5 mg Nebulization 4x Daily (RT) Rosette Reveal, MD   2.5 mg at 09/02/18 1533   ??? arformoterol (BROVANA) nebulizer solution 15 mcg/2 mL  15 mcg Nebulization BID (RT) Reinaldo Berber, MD   15 mcg at 09/02/18 1938   ??? budesonide (PULMICORT) nebulizer solution 1 mg  1 mg Nebulization BID (RT) Reinaldo Berber, MD   1 mg at 09/02/18 1936   ??? cetirizine (ZyrTEC) tablet 10 mg  10 mg G-tube QPM Reinaldo Berber, MD   10 mg at 09/02/18 1700   ??? cholecalciferol (vitamin D3) tablet 5,000 Units  5,000 Units G-tube Daily Reinaldo Berber, MD   5,000 Units at 09/02/18 0854   ??? citalopram (CeleXA) tablet 40 mg  40 mg G-tube Nightly Reinaldo Berber, MD   40 mg at 09/02/18 2024   ??? clindamycin (CLEOCIN T) 1 % lotion   Topical BID Rosette Reveal, MD       ???  cyclobenzaprine (FLEXERIL) tablet 5 mg  5 mg G-tube TID PRN Reinaldo Berber, MD   5 mg at 09/02/18 1610   ??? dextrose (D10W) 10% bolus 250 mL  25 g Intravenous Q30 Min PRN Kai Levins, MD       ??? diclofenac sodium (VOLTAREN) 1 % gel 2 g  2 g Topical 4x Daily PRN Napoleon Form, MD       ??? dicyclomine (BENTYL) capsule 10 mg  10 mg G-tube 4x Daily PRN Amy Johney Frame, MD       ??? dicyclomine (BENTYL) capsule 20 mg  20 mg G-tube Once Kai Levins, MD       ??? enoxaparin (LOVENOX) syringe 40 mg  40 mg Subcutaneous Q24H Reinaldo Berber, MD   40 mg at 09/02/18 0853   ??? fluticasone propionate (FLONASE) 50 mcg/actuation nasal spray 2 spray  2 spray Each Nare Daily Reinaldo Berber, MD   2 spray at 09/02/18 0855   ??? gabapentin (NEURONTIN) capsule 300 mg  300 mg G-tube TID Reinaldo Berber, MD   300 mg at 09/02/18 2025   ??? insulin lispro (HumaLOG) injection 0-12 Units  0-12 Units Subcutaneous ACHS Kai Levins, MD   4 Units at 09/02/18 1708   ??? ipratropium (ATROVENT) 0.02 % nebulizer solution 500 mcg  500 mcg Nebulization 4x Daily (RT) Rosette Reveal, MD   500 mcg at 09/02/18 1939   ??? levothyroxine (SYNTHROID, LEVOTHROID) tablet 75 mcg  75 mcg G-tube Daily Reinaldo Berber, MD   75 mcg at 09/02/18 0557   ??? lidocaine (LIDODERM) 5 % patch 1 patch  1 patch Transdermal Q24H Kai Levins, MD       ??? lidocaine (LIDODERM) 5 % patch 2 patch  2 patch Transdermal Daily Kai Levins, MD       ??? LORazepam (ATIVAN) tablet 0.5 mg  0.5 mg G-tube BID Reinaldo Berber, MD   0.5 mg at 09/02/18 2024   ??? meropenem (MERREM) 1 g in sodium chloride 0.9 % (NS) 100 mL IVPB-MBP  1 g Intravenous Q8H Rico Ala, MD 200 mL/hr at 09/03/18 0110 1 g at 09/03/18 0110   ??? minocycline (MINOCIN,DYNACIN) capsule 100 mg  100 mg G-tube Nightly Rosette Reveal, MD   100 mg at 09/02/18 2025   ??? mirtazapine (REMERON) tablet 15 mg  15 mg G-tube Nightly Reinaldo Berber, MD   15 mg at 09/02/18 2024   ??? montelukast (SINGULAIR) tablet 10 mg  10 mg G-tube Nightly Reinaldo Berber, MD   10 mg at 09/02/18 2024   ??? nystatin (MYCOSTATIN) powder 1 application  1 application Topical BID Patrecia Pour., MD   1 application at 09/02/18 2030   ??? oxyCODONE (ROXICODONE) immediate release tablet 5 mg  5 mg G-tube Q4H PRN Kai Levins, MD        Or   ??? oxyCODONE (ROXICODONE) immediate release tablet 10 mg  10 mg G-tube Q4H PRN Kai Levins, MD   10 mg at 09/02/18 2140   ??? pantoprazole (PROTONIX) oral suspension  20 mg G-tube BID Reinaldo Berber, MD   20 mg at 09/02/18 2025   ??? polyethylene glycol (MIRALAX) packet 17 g  17 g G-tube Daily Kai Levins, MD   17 g at 09/02/18 0854   ??? predniSONE oral solution  40 mg G-tube Daily Rosette Reveal, MD   40 mg at 09/02/18 0854   ??? promethazine (PHENERGAN) 12.5 mg in sodium chloride (  NS) 0.9 % 25 mL infusion  12.5 mg Intravenous Q6H PRN Rosette Reveal, MD   12.5 mg at 09/02/18 1425   ??? SMOG ENEMA  240 mL Rectal Once Rico Ala, MD       ??? sodium chloride 3 % nebulizer solution 4 mL  4 mL Nebulization 4x Daily (RT) Reinaldo Berber, MD   4 mL at 09/02/18 1938   ??? terazosin (HYTRIN) capsule 1 mg  1 mg G-tube Nightly Napoleon Form, MD   1 mg at 09/02/18 2025   ??? valproate (DEPAKENE) oral syrup  250 mg G-tube Q AM Reinaldo Berber, MD   250 mg at 09/02/18 0854   ??? valproate (DEPAKENE) oral syrup  750 mg G-tube Nightly Kai Levins, MD   750 mg at 09/02/18 2025        Review of Systems     A 10-system review of systems was conducted and was negative except as documented above in the HPI.         Objective        Temp:  [36.3 ??C-39.2 ??C] 37.2 ??C  Heart Rate:  [102-128] 115  SpO2 Pulse:  [103-126] 108  Resp:  [9-46] 19  BP: (103-164)/(43-95) 164/71  MAP (mmHg):  [59-112] 105  FiO2 (%):  [45 %-55 %] 50 %  SpO2:  [68 %-100 %] 95 %  I/O this shift:  In: 800 [P.O.:600; NG/GT:200]  Out: 625 [Urine:625]    Physical Exam:  General Appearance: Chronically ill appearing. In no acute distress.  Eyes: Sclera are anicteric and without injection. No discharge.  HENT: Head is atraumatic and normocephalic. Ears and nose without discharge. Oropharyngeal membranes are moist with no erythema or exudate.  Neck: Supple. Normal range of motion.  Lungs: Normal work of breathing. Tracheostomy in place.  Heart: Mildly tachycardic, otherwise regular rate.  GI: Soft, nondistended.  Skin: Small circular abrasion in the LUE.  Extremities: No clubbing, cyanosis, or edema.  Psychiatric: Behavior and affect are appropriate, frustrated at times.    Neurological Examination:   Mental Status: Awake, alert, attentive; memory for recent events poor, oriented and appropriate with history and conversation; speech fluent and follows commands well.  Cranial Nerves: Pupils are symmetric and reactive bilaterally, extraocular movements are preserved, face is symmetric at rest and on activation, facial sensation is preserved to temperature, palatal elevation is symmetrical, tongue protrudes midline with preserved movements, shoulder strength is 5/5.  Motor: Normal bulk and tone, no abnormal movements.  (R/L)  Deltoid 5/5  Biceps 5/5   Triceps 5/5   Hand grip 5/5      Hip flexion 5/5  Knee extension 5/5   Knee flexion 5/5  Dorsiflexion 5/5  Plantarflexion 5/5  Sensation: Intact and symmetric temperature and position sense in all extremities.  Coordination: Normal finger to nose bilaterally, foot taps normal in amplitude and speed.  Reflexes: 3+ bicep, 3+ brachioradialis, 3+ patellar (2 beats clonus) and 3+ ankle jerk (3 beats clonus) reflexes bilaterally; Hoffman's is present bilaterally; toes are mute bilaterally.  Gait: Not assessed.     Diagnostic Studies      Prior labs and imaging studies were personally reviewed as in the EMR. The following studies are of note:    All Labs Last 24hrs:   Recent Results (from the past 24 hour(s))   POCT Glucose    Collection Time: 09/02/18  5:54 AM   Result Value Ref Range    Glucose, POC 103 65 - 179 mg/dL  Basic Metabolic Panel    Collection Time: 09/02/18  5:55 AM   Result Value Ref Range    Sodium 140 135 - 145 mmol/L    Potassium 4.1 3.5 - 5.0 mmol/L    Chloride 98 98 - 107 mmol/L    CO2 30.0 22.0 - 30.0 mmol/L    Anion Gap 12 9 - 15 mmol/L    BUN 11 7 - 21 mg/dL    Creatinine 1.61 (L) 0.70 - 1.30 mg/dL    BUN/Creatinine Ratio 16     EGFR CKD-EPI Non-African American, Male >90 >=60 mL/min/1.37m2    EGFR CKD-EPI African American, Male >90 >=60 mL/min/1.62m2    Glucose 101 65 - 179 mg/dL    Calcium 8.8 8.5 - 09.6 mg/dL   CBC    Collection Time: 09/02/18  5:55 AM   Result Value Ref Range    WBC 7.3 4.5 - 11.0 10*9/L    RBC 4.36 (L) 4.50 - 5.90 10*12/L    HGB 10.2 (L) 13.5 - 17.5 g/dL    HCT 04.5 (L) 40.9 - 53.0 %    MCV 74.6 (L) 80.0 - 100.0 fL    MCH 23.3 (L) 26.0 - 34.0 pg    MCHC 31.3 31.0 - 37.0 g/dL    RDW 81.1 (H) 91.4 - 15.0 %    MPV 6.9 (L) 7.0 - 10.0 fL    Platelet 335 150 - 440 10*9/L   Magnesium Level    Collection Time: 09/02/18  5:55 AM   Result Value Ref Range    Magnesium 1.4 (L) 1.6 - 2.2 mg/dL   Lactate, Venous, Whole Blood    Collection Time: 09/02/18  5:55 AM   Result Value Ref Range    Lactate, Venous 4.0 (H) 0.5 - 1.8 mmol/L   Lower Respiratory Culture    Collection Time: 09/02/18 10:16 AM   Result Value Ref Range    Gram Stain 10-25 PMNS/LPF     Gram Stain <10  Epithelial cells/LPF     Gram Stain 3+ Gram positive bacilli     Gram Stain 2+ Smear Results Suggest Mixed oral flora     Gram Stain Acceptable for culture    Lactate, Venous, Whole Blood    Collection Time: 09/02/18 10:19 AM   Result Value Ref Range    Lactate, Venous 3.4 (H) 0.5 - 1.8 mmol/L   Blood Gas Critical Care Panel, Arterial    Collection Time: 09/02/18 11:09 AM   Result Value Ref Range    Specimen Source Arterial     FIO2 Arterial Not Specified     pH, Arterial 7.41 7.35 - 7.45    pCO2, Arterial 48.0 (H) 35.0 - 45.0 mm Hg    pO2, Arterial 86.5 80.0 - 110.0 mm Hg    HCO3 (Bicarbonate), Arterial 30 (H) 22 - 27 mmol/L    Base Excess, Arterial 5.2 (H) -2.0 - 2.0    O2 Sat, Arterial 97.4 94.0 - 100.0 %    Sodium Whole Blood 139 135 - 145 mmol/L    Potassium, Bld 4.1 3.4 - 4.6 mmol/L    Calcium, Ionized Arterial 4.57 4.40 - 5.40 mg/dL    Glucose Whole Blood 178 Undefined mg/dL    Lactate, Arterial 2.4 (H) <=1.2 mmol/L    Hgb, blood gas 9.20 (L) 13.50 - 17.50 g/dL   POCT Glucose    Collection Time: 09/02/18 12:16 PM   Result Value Ref Range    Glucose, POC 199 (H) 65 - 179 mg/dL  POCT Glucose    Collection Time: 09/02/18  5:07 PM   Result Value Ref Range    Glucose, POC 235 (H) 65 - 179 mg/dL   POCT Glucose    Collection Time: 09/02/18 10:01 PM   Result Value Ref Range    Glucose, POC 278 (H) 65 - 179 mg/dL   Blood Gas Critical Care Panel, Venous    Collection Time: 09/02/18 10:43 PM   Result Value Ref Range    Specimen Source Venous     FIO2 Venous Not Specified     pH, Venous 7.33 7.32 - 7.43    pCO2, Ven 60 40 - 60 mm Hg    pO2, Ven 43 30 - 55 mm Hg    HCO3, Ven 30 (H) 22 - 27 mmol/L    Base Excess, Ven 4.7 (H) -2.0 - 2.0    O2 Saturation, Venous 70.9 40.0 - 85.0 %    Sodium Whole Blood 134 (L) 135 - 145 mmol/L    Potassium, Bld 4.3 3.4 - 4.6 mmol/L    Calcium, Ionized Venous 4.85 4.40 - 5.40 mg/dL    Glucose Whole Blood 288 Undefined mg/dL    Lactate, Venous 2.1 (H) 0.5 - 1.8 mmol/L    Hgb, blood gas 9.90 (L) 13.50 - 17.50 g/dL   Troponin I    Collection Time: 09/02/18 10:43 PM   Result Value Ref Range    Troponin I <0.034 <0.034 ng/mL   Lactate, Arterial, Whole Blood    Collection Time: 09/02/18 11:41 PM Result Value Ref Range    Lactate, Arterial 2.1 (H) <=1.2 mmol/L   Blood Gas Critical Care Panel, Arterial    Collection Time: 09/02/18 11:41 PM   Result Value Ref Range    Specimen Source Arterial     FIO2 Arterial 50%     pH, Arterial 7.36 7.35 - 7.45    pCO2, Arterial 52.5 (H) 35.0 - 45.0 mm Hg    pO2, Arterial 83.8 80.0 - 110.0 mm Hg    HCO3 (Bicarbonate), Arterial 29 (H) 22 - 27 mmol/L    Base Excess, Arterial 3.6 (H) -2.0 - 2.0    O2 Sat, Arterial 95.8 94.0 - 100.0 %    Sodium Whole Blood 135 135 - 145 mmol/L    Potassium, Bld 4.7 (H) 3.4 - 4.6 mmol/L    Calcium, Ionized Arterial 4.69 4.40 - 5.40 mg/dL    Glucose Whole Blood 275 Undefined mg/dL    Lactate, Arterial 2.1 (H) <=1.2 mmol/L    Hgb, blood gas 9.80 (L) 13.50 - 17.50 g/dL   Magnesium Level    Collection Time: 09/03/18 12:10 AM   Result Value Ref Range    Magnesium 1.8 1.6 - 2.2 mg/dL   CBC    Collection Time: 09/03/18 12:10 AM   Result Value Ref Range    WBC 8.2 4.5 - 11.0 10*9/L    RBC 4.09 (L) 4.50 - 5.90 10*12/L    HGB 9.9 (L) 13.5 - 17.5 g/dL    HCT 16.1 (L) 09.6 - 53.0 %    MCV 75.2 (L) 80.0 - 100.0 fL    MCH 24.2 (L) 26.0 - 34.0 pg    MCHC 32.2 31.0 - 37.0 g/dL    RDW 04.5 (H) 40.9 - 15.0 %    MPV 7.1 7.0 - 10.0 fL    Platelet 329 150 - 440 10*9/L   Comprehensive Metabolic Panel    Collection Time: 09/03/18 12:10 AM   Result Value Ref Range  Sodium 135 135 - 145 mmol/L    Potassium 4.8 3.5 - 5.0 mmol/L    Chloride 96 (L) 98 - 107 mmol/L    CO2 31.0 (H) 22.0 - 30.0 mmol/L    BUN 17 7 - 21 mg/dL    Creatinine 1.61 0.96 - 1.30 mg/dL    BUN/Creatinine Ratio 21     EGFR CKD-EPI Non-African American, Male >90 >=60 mL/min/1.61m2    EGFR CKD-EPI African American, Male >90 >=60 mL/min/1.75m2    Glucose 256 (H) 65 - 179 mg/dL    Calcium 8.7 8.5 - 04.5 mg/dL    Albumin 3.4 (L) 3.5 - 5.0 g/dL    Total Protein 6.2 (L) 6.5 - 8.3 g/dL    Total Bilirubin 0.5 0.0 - 1.2 mg/dL    AST 20 19 - 55 U/L    ALT 32 19 - 72 U/L    Alkaline Phosphatase 52 38 - 126 U/L Anion Gap 8 7 - 15 mmol/L   Toxicology Screen, Urine    Collection Time: 09/03/18 12:25 AM   Result Value Ref Range    Amphetamine Screen, Ur <500 ng/mL Not Applicable    Barbiturate Screen, Ur <200 ng/mL Not Applicable    Benzodiazepine Screen, Urine <200 ng/mL Not Applicable    Cannabinoid Scrn, Ur <20 ng/mL Not Applicable    Methadone Screen, Urine <300 ng/mL Not Applicable    Cocaine(Metab.)Screen, Urine <150 ng/mL Not Applicable    Opiate Scrn, Ur =/>300 ng/mL (A) Not Applicable   Urinalysis    Collection Time: 09/03/18 12:25 AM   Result Value Ref Range    Color, UA Yellow     Clarity, UA Clear     Specific Gravity, UA 1.029 1.003 - 1.030    pH, UA 6.0 5.0 - 9.0    Leukocyte Esterase, UA Negative Negative    Nitrite, UA Negative Negative    Protein, UA 30 mg/dL (A) Negative    Glucose, UA 150 mg/dL (A) Negative    Ketones, UA Trace (A) Negative    Urobilinogen, UA 0.2 mg/dL 0.2 mg/dL, 1.0 mg/dL    Bilirubin, UA Negative Negative    Blood, UA Negative Negative    RBC, UA <1 <=3 /HPF    WBC, UA 1 <=2 /HPF    Squam Epithel, UA <1 0 - 5 /HPF    Bacteria, UA Rare (A) None Seen /HPF    Mucus, UA Rare (A) None Seen /HPF     CTH, personally reviewed imaging (09/03/2018): unremarkable, no acute hemorrhage or mass lesion seen

## 2018-09-03 NOTE — Unmapped (Addendum)
Patient compliant with all inhaled medications. Patient on ventilator and did not want to be placed on ATC. No changes made on ventilator. Trach care completed, airway patent and secured. Will continue to monitor.   Problem: Device-Related Complication Risk (Artificial Airway)  Goal: Optimal Device Function  Outcome: Progressing  Intervention: Optimize Device Care and Function  Flowsheets (Taken 09/03/2018 1805)  Airway/Ventilation Management: airway patency maintained; humidification applied; pulmonary hygiene promoted  Aspiration Precautions: respiratory status monitored  Airway Safety Measures: mask at bedside; manual resuscitator at bedside; manual resuscitator/mask/valve in room; suction at bedside     Problem: Skin and Tissue Injury (Artificial Airway)  Goal: Absence of Device-Related Skin or Tissue Injury  Outcome: Progressing  Intervention: Maintain Skin and Tissue Health  Flowsheets (Taken 09/03/2018 1805)  Device Skin Pressure Protection: skin-to-device areas padded     Problem: Breathing Pattern Ineffective  Goal: Effective Breathing Pattern  Outcome: Progressing  Intervention: Promote Improved Breathing Pattern  Flowsheets (Taken 09/03/2018 1805)  Supportive Measures: self-care encouraged  Head of Bed (HOB): HOB at 30-45 degrees  Airway/Ventilation Management: airway patency maintained; humidification applied; pulmonary hygiene promoted     Problem: Inability to Wean (Mechanical Ventilation, Invasive)  Goal: Mechanical Ventilation Liberation  Outcome: Progressing  Intervention: Promote Extubation and Careers adviser  Flowsheets (Taken 09/03/2018 1805)  Environmental Support: rest periods encouraged  Sleep/Rest Enhancement: awakenings minimized  Medication Review/Management: medications reviewed     Problem: Ventilator-Induced Lung Injury (Mechanical Ventilation, Invasive)  Goal: Absence of Ventilator-Induced Lung Injury  Outcome: Progressing  Intervention: Facilitate Lung-Protection Measures  Flowsheets (Taken 09/03/2018 1805)  Lung Protection Measures: plateau/inspiratory pressure monitored; ventilator synchrony promoted  Intervention: Prevent Ventilator-Associated Pneumonia  Flowsheets (Taken 09/03/2018 1805)  Head of Bed (HOB): HOB at 30-45 degrees  VAP Prevention Bundle: HOB elevation maintained; vent circuit breaks minimized

## 2018-09-03 NOTE — Unmapped (Signed)
Med G Daily Progress Note    Interval History/Subjective:  Overnight became hypoxic after ambulating the halls followed by dizziness. Placed on vent 42%. Then developed a fever and was given PRN medications. Several hours later pt developed increased lethargy and was found hypoxic with increased oral and tracheal secretions. C/f aspiration. Required 60% FiO2. This morning developed fever 102 with hypotension. Broad abcx started and 2L fluid given. On reassessment the pt was more awake. Blood cultures and lrcx pending.    Assessment/Plan:    Principal Problem:    Dyspnea  Active Problems:    Asthma    Hypothyroidism    Chronic tracheobronchitis (CMS-HCC)    Tracheostomy dependence (CMS-HCC)    VATER syndrome    PEG (percutaneous endoscopic gastrostomy) status (CMS-HCC)    History of home ventilator (CMS-HCC)    Bipolar 1 disorder (CMS-HCC)  Resolved Problems:    * No resolved hospital problems. *      Cameron Baker is a 27 y.o. male with a history of VATER syndrome with tracheobronchomalacia s/p PEG and tracheostomy with nocturnal ventilation c/b recurrent tracheobronchitis, Bipolar I disorder, asthma, and hypothyroidism that presented to [facility] with Dyspnea, likely either recurrent tracheobronchitis vs asthma exacerbation. Current hospital course c/b sepsis in the setting of possible mucus plugging or aspiration.    Sepsis- c/f for pulmonary source in the setting of known mucus plugging and possible aspiration event. S/p IVF resuscitation and given broad spectrum abcx. Now clinically stable but still with oxygen requirement.  - continue cefepime, vancomycin  - f/u blood and LRcx  - repeat CXR 09/28  ??  Asthma Exacerbation: Most likely etiology given wheezing, and having recently completed antibiotic therapy for pneumonia with improvement on imaging.   - Sputum culture w/ mixed gram positive and gram negative organisms  - Continue Levaquin (9/21-9/26)   - Continue prednisone 40mg  daily (9/21-). Will do a 14 day course and discuss taper.  - Continue LABA/ICS as below  - Scheduled atrovent, prn albuterol     R. Flank pain, Tachycardia- stable  CTPE negative. No pneumothorax or effusion. Trace hemoptysis likely from bronchiectasis or coughing from tracheobronchitis. US abdomen with punctate kidney stones and patient reports a history of kidney stones. However stones are non-obstructing, UA without hematuria and symptoms started after patient reported being told he would be discharged. Patient has a history of frequent admissions and anxiety surrounding discharge.  - Pain control with scheduled tylenol, lidocaine patch, prn diclofenac and oxycodone   - Terazosin as tamsulosin cannot be crushed through g tube    VATER with associated tracheobronchomalacia s/p trach and PEG, on chronic nocturnal vent  - Continue LABA/ICS  - Airway clearance   - Hold home azithromycin MWF while on levofloxacin   - Continue home Trilogy vent qhs (AVAPS, VT 525 ml, PEEP 10 cm H2O)  ??  Bipolar I: Continue home citalopram, valproate, mirtazapine, and lorazepam  Hypothyroidism: Continue home synthroid daily  Microcytic anemia: at baseline, no in hospital interventions.  Slow transit constipation: continued on??bentyl QID prn and daily miralax  Lower extremity muscle spasms: Continued on home??cyclobenzaprine prn  Acne:??Home minocycline   ??  DVT ppx: lovenox   Code Status: Full Code  Dispo: Med G, stepdown given nocturnal vent  ___________________________________________________________________    Labs/Studies:  Labs and Studies from the last 24hrs per EMR and Reviewed    Objective:  Temp:  [36.3 ??C-39.2 ??C] 37.1 ??C  Heart Rate:  [102-128] 106  SpO2 Pulse:  [103-126] 106  Resp:  [15-46] 21  BP: (103-153)/(43-95) 126/62  FiO2 (%):  [40 %-55 %] 45 %  SpO2:  [68 %-100 %] 100 %, No intake/output data recorded.,   Wt Readings from Last 3 Encounters:   08/27/18 (!) 103 kg (227 lb 1.2 oz)   08/20/18 (!) 103.9 kg (229 lb 0.9 oz)   08/06/18 95.7 kg (211 lb) General: Obese young male, lying in bed on vent, appears sick  Neuro: drowsy but responds to voice, alert and oriented x 3.  NECK: trachea midline with tracheostomy in place  RESP: Coarse breath sounds bilaterally, with diffuse expiratory wheezing and rapid, shallow breathing   CV: Tachycardic. Regular rhythm., warm extremities, 2+ radial pulsus  Abdomen: No palpable mass or deformity. RUQ tenderness to palpation. Normal bowel sounds. CVA tenderness.   Skin: erythema surrounding G tube and on chest

## 2018-09-03 NOTE — Unmapped (Signed)
Speech Language Trach Speaking Valve Assessment  09/03/2018    Patient Name:  Cameron Baker       Medical Record Number: [id number]   Date of Birth: 1991-03-30  Sex: Male            SLP Treatment Diagnosis: communication impairment secondary to trach  Session Duration : 12  Activity Tolerance: Patient tolerated treatment well    Assessment:   Consult received for speaking valve. Pt reports this is to replace lost speaking valve, per chart review does utilize speaking valve during the day typically when on trach collar; nocturnal ventilation. Pt able to relate speaking valve use and precautions easily and reports no change from baseline trach. Pt currently on vent support via Bivona 6 cuffless trach, voicing around trach with clear voice, mildly high pitched, speech fluent and intelligible. Unable to come off vent at present, but with goal of returning to only nocturnal ventilation and use of speaking valve during the day. No difficulty tolerating speaking valve anticipated based upon current exam and chart review; speaking valve left at bedside. No further ST needs.    Prognosis: Excellent    Plan of Care:  SLP Follow-up / Frequency: D/C Services, D/C Services      Treatment Goals:  Patient and Family Goals: to get back off the vent and keep using speaking valve   Discharge recommendations: no post-acute ST needs    Subjective:  Current Functional Status: Cameron Baker is a 27 y.o. male with a history of VATER syndrome with tracheobronchomalacia s/p PEG and tracheostomy with nocturnal ventilation c/b recurrent tracheobronchitis, Bipolar I disorder, asthma, and hypothyroidism admitted 9/20 with Dyspnea, likely either recurrent tracheobronchitis vs asthma exacerbation. Currently awake, alert, cooperative. Attentive and following commands. Speech fluent and intelligible, voicing around cuffless trach while on vent.     Communication Preference: Verbal  Patient/Caregiver Reports: pt confirms long history of speaking valve use (known to ST and verified), able to relate all safety precautions. Reports that he typically uses speaking valve during the day when not on vent but lost his speaking valve this admission and requests another.      Current Facility-Administered Medications   Medication Dose Route Frequency Provider Last Rate Last Dose   ??? acetaminophen (TYLENOL) tablet 1,000 mg  1,000 mg G-tube Q8H Neeharika Nandam, MD   1,000 mg at 09/03/18 1319   ??? albuterol 2.5 mg /3 mL (0.083 %) nebulizer solution 2.5 mg  2.5 mg Nebulization Q6H PRN Reinaldo Berber, MD   2.5 mg at 09/02/18 1910   ??? albuterol 2.5 mg /3 mL (0.083 %) nebulizer solution 2.5 mg  2.5 mg Nebulization 4x Daily (RT) Rosette Reveal, MD   2.5 mg at 09/03/18 8119   ??? arformoterol (BROVANA) nebulizer solution 15 mcg/2 mL  15 mcg Nebulization BID (RT) Reinaldo Berber, MD   15 mcg at 09/03/18 0859   ??? budesonide (PULMICORT) nebulizer solution 1 mg  1 mg Nebulization BID (RT) Reinaldo Berber, MD   1 mg at 09/03/18 1478   ??? cefepime (MAXIPIME) 2 g in dextrose 100 mL IVPB (premix)  2 g Intravenous Q8H Napoleon Form, MD       ??? cetirizine (ZyrTEC) tablet 10 mg  10 mg G-tube QPM Reinaldo Berber, MD   10 mg at 09/02/18 1700   ??? cholecalciferol (vitamin D3) tablet 5,000 Units  5,000 Units G-tube Daily Reinaldo Berber, MD   5,000 Units at 09/03/18 0853   ??? citalopram (  CeleXA) tablet 40 mg  40 mg G-tube Nightly Reinaldo Berber, MD   40 mg at 09/02/18 2024   ??? clindamycin (CLEOCIN T) 1 % lotion   Topical BID Rosette Reveal, MD       ??? cyclobenzaprine (FLEXERIL) tablet 5 mg  5 mg G-tube TID PRN Reinaldo Berber, MD   5 mg at 09/02/18 0981   ??? dextrose (D10W) 10% bolus 250 mL  25 g Intravenous Q30 Min PRN Kai Levins, MD       ??? diclofenac sodium (VOLTAREN) 1 % gel 2 g  2 g Topical 4x Daily PRN Napoleon Form, MD       ??? dicyclomine (BENTYL) capsule 10 mg  10 mg G-tube 4x Daily PRN Reinaldo Berber, MD       ??? enoxaparin (LOVENOX) syringe 40 mg  40 mg Subcutaneous Q24H Reinaldo Berber, MD   40 mg at 09/03/18 0853   ??? fluticasone propionate (FLONASE) 50 mcg/actuation nasal spray 2 spray  2 spray Each Nare Daily Reinaldo Berber, MD   2 spray at 09/03/18 0855   ??? gabapentin (NEURONTIN) capsule 300 mg  300 mg G-tube TID Reinaldo Berber, MD   300 mg at 09/03/18 1319   ??? insulin lispro (HumaLOG) injection 0-12 Units  0-12 Units Subcutaneous ACHS Kai Levins, MD   4 Units at 09/03/18 1250   ??? ipratropium (ATROVENT) 0.02 % nebulizer solution 500 mcg  500 mcg Nebulization 4x Daily (RT) Rosette Reveal, MD   500 mcg at 09/03/18 1914   ??? levothyroxine (SYNTHROID, LEVOTHROID) tablet 75 mcg  75 mcg G-tube Daily Reinaldo Berber, MD   75 mcg at 09/03/18 0533   ??? lidocaine (LIDODERM) 5 % patch 1 patch  1 patch Transdermal Q24H Kai Levins, MD       ??? lidocaine (LIDODERM) 5 % patch 2 patch  2 patch Transdermal Daily Kai Levins, MD   2 patch at 09/03/18 0914   ??? LORazepam (ATIVAN) tablet 0.5 mg  0.5 mg G-tube BID Reinaldo Berber, MD   0.5 mg at 09/03/18 0853   ??? minocycline (MINOCIN,DYNACIN) capsule 100 mg  100 mg G-tube Nightly Rosette Reveal, MD   100 mg at 09/02/18 2025   ??? mirtazapine (REMERON) tablet 15 mg  15 mg G-tube Nightly Reinaldo Berber, MD   15 mg at 09/02/18 2024   ??? montelukast (SINGULAIR) tablet 10 mg  10 mg G-tube Nightly Reinaldo Berber, MD   10 mg at 09/02/18 2024   ??? nystatin (MYCOSTATIN) powder 1 application  1 application Topical BID Patrecia Pour., MD   1 application at 09/03/18 0914   ??? ondansetron (ZOFRAN) injection 4 mg  4 mg Intravenous Q6H PRN Napoleon Form, MD       ??? oxyCODONE (ROXICODONE) immediate release tablet 5 mg  5 mg G-tube Q4H PRN Kai Levins, MD        Or   ??? oxyCODONE (ROXICODONE) immediate release tablet 10 mg  10 mg G-tube Q4H PRN Kai Levins, MD   10 mg at 09/02/18 2140   ??? pantoprazole (PROTONIX) oral suspension  20 mg G-tube BID Reinaldo Berber, MD   20 mg at 09/03/18 0854   ??? polyethylene glycol (MIRALAX) packet 17 g  17 g G-tube Daily Kai Levins, MD   17 g at 09/03/18 0854   ??? predniSONE oral solution  40 mg G-tube Daily Rosette Reveal, MD   40 mg  at 09/03/18 0858   ??? sodium chloride 3 % nebulizer solution 4 mL  4 mL Nebulization 4x Daily (RT) Reinaldo Berber, MD   4 mL at 09/03/18 0828   ??? terazosin (HYTRIN) capsule 1 mg  1 mg G-tube Nightly Napoleon Form, MD   1 mg at 09/02/18 2025   ??? valproate (DEPAKENE) oral syrup  250 mg G-tube Q AM Reinaldo Berber, MD   250 mg at 09/03/18 0854   ??? valproate (DEPAKENE) oral syrup  750 mg G-tube Nightly Kai Levins, MD   750 mg at 09/02/18 2025       Past Medical History:   Diagnosis Date   ??? ADHD    ??? Asthma    ??? Bipolar 1 disorder (CMS-HCC)    ??? Chronic tracheobronchitis (CMS-HCC)    ??? Constipation    ??? De Quervain's tenosynovitis    ??? Dysphagia    ??? Esophageal dysmotility    ??? Esophageal stricture    ??? GERD (gastroesophageal reflux disease)    ??? hypothyroid    ??? Seizures (CMS-HCC)    ??? Tracheobronchomalacia    ??? VATER syndrome    ??? Ventilator dependence (CMS-HCC)       Social History     Tobacco Use   ??? Smoking status: Never Smoker   ??? Smokeless tobacco: Never Used   Substance Use Topics   ??? Alcohol use: Yes     Alcohol/week: 2.0 standard drinks     Types: 2 Cans of beer per week     Comment: on weekends sometimes     Past Surgical History:   Procedure Laterality Date   ??? ANAL DILATION  01/21/2006   ??? ESOPHAGOGASTRODUODENOSCOPY      multiple   ??? HIP SURGERY Right 01/19/2017    shaved   ??? IR INSERT PORT AGE GREATER THAN 5 YRS  06/22/2018    IR INSERT PORT AGE GREATER THAN 5 YRS 06/22/2018 Andres Labrum, MD IMG [facility]   ??? LATERAL RECTUS RECESSION  11/15/2003   ??? NISSEN FUNDOPLICATION  2001    x 2 redos   ??? PORTACATH PLACEMENT  01/21/2006    taken out same year   ??? PR ESOPHAGEAL MOTILITY STUDY, MANOMETRY N/A 04/23/2018    Procedure: ESOPHAGEAL MOTILITY STUDY W/INT & REP;  Surgeon: Nurse-Based Giproc;  [facility];  Service: Gastroenterology   ??? PR UP GI ENDOSCOPY,BALL DIL,30MM N/A 02/22/2018    Procedure: UGI ENDO; W/BALLOON DILAT ESOPHAGUS (<30MM DIAM);  Surgeon: Zetta Bills, MD;  [facility];  Service: Gastroenterology   ??? PR UP GI ENDOSCOPY,BALL DIL,30MM N/A 05/20/2018    Procedure: UGI ENDO; W/BALLOON DILAT ESOPHAGUS (<30MM DIAM);  Surgeon: Rona Ravens, MD;  Location: [facility];  Service: Gastroenterology   ??? PR UP GI ENDOSCOPY,BALL DIL,30MM N/A 08/06/2018    Procedure: UGI ENDO; W/BALLOON DILAT ESOPHAGUS (<30MM DIAM);  Surgeon: Annie Paras, MD;  [facility]: [facility];  Service: Gastroenterology   ??? REMOVAL VENOUS ACCESS PORT [facility] HISTORICAL RESULT)  11/03/2006   ??? STRABISMUS SURGERY Right    ??? TRACHEOSTOMY  1994   ??? WISDOM TOOTH EXTRACTION  04/2012   ??? WRIST SURGERY      3 times.       Family History   Adopted: Yes       Xopenex [levalbuterol hcl]; Allopurinol analogues; and Versed [midazolam]     Medical Tests / Procedures Comments: CXR 9/26: mild  pulmonary edema     Equipment/Environment: Supplemental oxygen;Ventilatory support     Objective:  Trach Make and Type: Bivona;Cuffless  Trach Size: 6  Trach Status Upon Arrival: No Tracheal Suctioning Needed Prior to Session  Trach Status During SLP Intervention: Upper Airway Patency with Finger Occlusion  Secretion Status: Minimal Suctioning Needed  Vent Support: Nocturnal Ventilation(Volume Support, rate 18; PS 12/PEEP 10; FiO2 55%)  Test for: Upper Airway Patency Pass;Voicing Pass    Start of Trial  Oxygen Saturation: 100  Pulse: 85  Respiratory Rate: 15    I attest that I have reviewed the above information.  Signed: Vianne Bulls, CCC-SLP  Filed 09/03/2018

## 2018-09-03 NOTE — Unmapped (Signed)
Attaching order. Please see previous note for details.

## 2018-09-03 NOTE — Unmapped (Signed)
Patient resting on AVAPS @ 55% FiO2. Rapid response called this shift due to somnolence and mental status change. Taken to CT. Janina Mayo is patent and trach care completed. All inhaled medications given. Will continue to monitor.

## 2018-09-03 NOTE — Unmapped (Signed)
Follow-up Consult Note        Requesting Attending Physician:  Dellis Filbert, MD  Service Requesting Consult: Pulmonology (MDG)     Assessment and Plan          Cameron Baker is a 27 y.o. male with PMHx of VATER syndrome, tracheobronchomalacia, bipolar I disorder, PNES on whom Neurology has been asked by Dellis Filbert, MD to consult for episodes of drowsiness.    Patient currently admitted for tracheitis vs asthma exacerbation, complicated by hypoxic and hypotensive events. Now with two episodes of increased drowsiness and somnolence in the setting of administration with multiple narcotics and centrally-acting medications. Mental status and neurological exam are intact this morning. Agree with previous assessment that presentation is more likely related to toxic-metabolic encephalopathy, including over sedation with centrally acting medications. No concern for seizures at this time.    RECOMMENDATIONS:   1. Minimize use of centrally acting medications  2. No vEEG at the moment    This patient was seen and discussed with Dr. Lucina Mellow who agrees with the above assessment and plan. We will sign off at this time. Please re-consult if further neurologic questions arise.    Zuleyma E. Toledo-Nieves, MD  Resident Physician - PGY3  Department of Neurology    Attending Note Addendum    I saw and evaluated the patient, participating in the key portions of the service. I reviewed the resident???s note.  I also reviewed the HPI, ROS, FHx, SHx, PMh and labs in the initial consult from Dr. Rubye Oaks.  I agree with the resident???s findings and plan.      Kieran Arreguin L. Lucina Mellow, MD       Subjective        Reason for Consult: drowsiness    Patient sleeping this AM. Irritated and annoyed upon my examination but answered all questions and followed all commands angrily. Reported feeling well this AM.          Objective        Temp:  [36.9 ??C-37.8 ??C] 37.4 ??C  Heart Rate:  [100-124] 100  SpO2 Pulse:  [103-122] 105  Resp:  [9-46] 16  BP: (103-164)/(43-95) 141/49  MAP (mmHg):  [59-112] 77  FiO2 (%):  [45 %-55 %] 55 %  SpO2:  [68 %-100 %] 97 %  No intake/output data recorded.    Physical Exam:  General Appearance: Well appearing. In no acute distress. Obese.  HEENT: Head is atraumatic and normocephalic. Sclera anicteric without injection. Oropharyngeal membranes are moist with no erythema or exudate.  Neck: Supple.  Lungs: Normal work of breathing. Trach and vent in place.  Heart: Regular rate and rhythm.  Abdomen: Soft, nontender, nondistended.  Extremities: No clubbing, cyanosis or edema.    Neurological Examination:     Mental Status: Asleep, irritated when awoken and during exam. Oriented to person, time, place. Follows commands well. Language is fluent and with no paraphasic errors.   Cranial Nerves: Pupils are 5->15mm reactive OU. EOM are intact. Facial sensation intact bilaterally to light touch. Face is symmetric at rest and activation. Hearing intact to conversation. Palate movement is symmetric. Tongue protrudes midline and tongue movements are normal.   Motor Exam:   Normal bulk. No tremors, myoclonus, or other adventitious movement.  (R/L)  Deltoid 5/5  Biceps 5/5   Triceps 5/5   Hand grip 5/5      Hip flexion 5/5  Knee extension 5/5   Knee flexion 5/5  Dorsiflexion 5/5  Plantarflexion 5/5  Reflexes: 3+ bicep, 3+ brachioradialis, 3+ patellar (2 beats clonus) and 3+ ankle jerk (3 beats clonus) reflexes bilaterally; Hoffman's is present bilaterally; toes are mute bilaterally.   Sensory: intact and symmetric light touch in all extremities   Coordination: no ataxia bilaterally   Gait: deferred           Medications:  Scheduled medications:   ??? acetaminophen  1,000 mg G-tube Q8H   ??? albuterol  2.5 mg Nebulization 4x Daily (RT)   ??? arformoterol  15 mcg Nebulization BID (RT)   ??? budesonide  1 mg Nebulization BID (RT)   ??? cetirizine  10 mg G-tube QPM   ??? cholecalciferol (vitamin D3)  5,000 Units G-tube Daily   ??? citalopram  40 mg G-tube Nightly ??? clindamycin   Topical BID   ??? enoxaparin (LOVENOX) injection  40 mg Subcutaneous Q24H   ??? fluticasone propionate  2 spray Each Nare Daily   ??? gabapentin  300 mg G-tube TID   ??? insulin lispro  0-12 Units Subcutaneous ACHS   ??? ipratropium  500 mcg Nebulization 4x Daily (RT)   ??? levothyroxine  75 mcg G-tube Daily   ??? lidocaine  1 patch Transdermal Q24H   ??? lidocaine  2 patch Transdermal Daily   ??? LORazepam  0.5 mg G-tube BID   ??? meropenem  1 g Intravenous Q8H   ??? minocycline  100 mg G-tube Nightly   ??? mirtazapine  15 mg G-tube Nightly   ??? montelukast  10 mg G-tube Nightly   ??? nystatin  1 application Topical BID   ??? pantoprazole  20 mg G-tube BID   ??? polyethylene glycol  17 g G-tube Daily   ??? predniSONE  40 mg G-tube Daily   ??? sodium chloride  4 mL Nebulization 4x Daily (RT)   ??? terazosin  1 mg G-tube Nightly   ??? valproate  250 mg G-tube Q AM   ??? valproate  750 mg G-tube Nightly     Continuous infusions:   PRN medications: albuterol **AND** [DISCONTINUED] ipratropium, cyclobenzaprine, dextrose, diclofenac sodium, dicyclomine, oxyCODONE **OR** oxyCODONE, promethazine     Diagnostic Studies      All Labs Last 24hrs:   Recent Results (from the past 24 hour(s))   Lower Respiratory Culture    Collection Time: 09/02/18 10:16 AM   Result Value Ref Range    Gram Stain 10-25 PMNS/LPF     Gram Stain <10  Epithelial cells/LPF     Gram Stain 3+ Gram positive bacilli     Gram Stain 2+ Smear Results Suggest Mixed oral flora     Gram Stain Acceptable for culture    Lactate, Venous, Whole Blood    Collection Time: 09/02/18 10:19 AM   Result Value Ref Range    Lactate, Venous 3.4 (H) 0.5 - 1.8 mmol/L   Blood Gas Critical Care Panel, Arterial    Collection Time: 09/02/18 11:09 AM   Result Value Ref Range    Specimen Source Arterial     FIO2 Arterial Not Specified     pH, Arterial 7.41 7.35 - 7.45    pCO2, Arterial 48.0 (H) 35.0 - 45.0 mm Hg    pO2, Arterial 86.5 80.0 - 110.0 mm Hg    HCO3 (Bicarbonate), Arterial 30 (H) 22 - 27 mmol/L Base Excess, Arterial 5.2 (H) -2.0 - 2.0    O2 Sat, Arterial 97.4 94.0 - 100.0 %    Sodium Whole Blood 139 135 - 145 mmol/L    Potassium,  Bld 4.1 3.4 - 4.6 mmol/L    Calcium, Ionized Arterial 4.57 4.40 - 5.40 mg/dL    Glucose Whole Blood 178 Undefined mg/dL    Lactate, Arterial 2.4 (H) <=1.2 mmol/L    Hgb, blood gas 9.20 (L) 13.50 - 17.50 g/dL   POCT Glucose    Collection Time: 09/02/18 12:16 PM   Result Value Ref Range    Glucose, POC 199 (H) 65 - 179 mg/dL   POCT Glucose    Collection Time: 09/02/18  5:07 PM   Result Value Ref Range    Glucose, POC 235 (H) 65 - 179 mg/dL   POCT Glucose    Collection Time: 09/02/18 10:01 PM   Result Value Ref Range    Glucose, POC 278 (H) 65 - 179 mg/dL   ECG 12 Lead    Collection Time: 09/02/18 10:06 PM   Result Value Ref Range    EKG Systolic BP  mmHg    EKG Diastolic BP  mmHg    EKG Ventricular Rate 121 BPM    EKG Atrial Rate 121 BPM    EKG P-R Interval 116 ms    EKG QRS Duration 82 ms    EKG Q-T Interval 328 ms    EKG QTC Calculation 465 ms    EKG Calculated P Axis 32 degrees    EKG Calculated R Axis 28 degrees    EKG Calculated T Axis 49 degrees    QTC Fredericia 414 ms   Blood Gas Critical Care Panel, Venous    Collection Time: 09/02/18 10:43 PM   Result Value Ref Range    Specimen Source Venous     FIO2 Venous Not Specified     pH, Venous 7.33 7.32 - 7.43    pCO2, Ven 60 40 - 60 mm Hg    pO2, Ven 43 30 - 55 mm Hg    HCO3, Ven 30 (H) 22 - 27 mmol/L    Base Excess, Ven 4.7 (H) -2.0 - 2.0    O2 Saturation, Venous 70.9 40.0 - 85.0 %    Sodium Whole Blood 134 (L) 135 - 145 mmol/L    Potassium, Bld 4.3 3.4 - 4.6 mmol/L    Calcium, Ionized Venous 4.85 4.40 - 5.40 mg/dL    Glucose Whole Blood 288 Undefined mg/dL    Lactate, Venous 2.1 (H) 0.5 - 1.8 mmol/L    Hgb, blood gas 9.90 (L) 13.50 - 17.50 g/dL   Troponin I    Collection Time: 09/02/18 10:43 PM   Result Value Ref Range    Troponin I <0.034 <0.034 ng/mL   Lactate, Arterial, Whole Blood    Collection Time: 09/02/18 11:41 PM Result Value Ref Range    Lactate, Arterial 2.1 (H) <=1.2 mmol/L   Blood Gas Critical Care Panel, Arterial    Collection Time: 09/02/18 11:41 PM   Result Value Ref Range    Specimen Source Arterial     FIO2 Arterial 50%     pH, Arterial 7.36 7.35 - 7.45    pCO2, Arterial 52.5 (H) 35.0 - 45.0 mm Hg    pO2, Arterial 83.8 80.0 - 110.0 mm Hg    HCO3 (Bicarbonate), Arterial 29 (H) 22 - 27 mmol/L    Base Excess, Arterial 3.6 (H) -2.0 - 2.0    O2 Sat, Arterial 95.8 94.0 - 100.0 %    Sodium Whole Blood 135 135 - 145 mmol/L    Potassium, Bld 4.7 (H) 3.4 - 4.6 mmol/L    Calcium, Ionized  Arterial 4.69 4.40 - 5.40 mg/dL    Glucose Whole Blood 275 Undefined mg/dL    Lactate, Arterial 2.1 (H) <=1.2 mmol/L    Hgb, blood gas 9.80 (L) 13.50 - 17.50 g/dL   Magnesium Level    Collection Time: 09/03/18 12:10 AM   Result Value Ref Range    Magnesium 1.8 1.6 - 2.2 mg/dL   CBC    Collection Time: 09/03/18 12:10 AM   Result Value Ref Range    WBC 8.2 4.5 - 11.0 10*9/L    RBC 4.09 (L) 4.50 - 5.90 10*12/L    HGB 9.9 (L) 13.5 - 17.5 g/dL    HCT 81.1 (L) 91.4 - 53.0 %    MCV 75.2 (L) 80.0 - 100.0 fL    MCH 24.2 (L) 26.0 - 34.0 pg    MCHC 32.2 31.0 - 37.0 g/dL    RDW 78.2 (H) 95.6 - 15.0 %    MPV 7.1 7.0 - 10.0 fL    Platelet 329 150 - 440 10*9/L   Comprehensive Metabolic Panel    Collection Time: 09/03/18 12:10 AM   Result Value Ref Range    Sodium 135 135 - 145 mmol/L    Potassium 4.8 3.5 - 5.0 mmol/L    Chloride 96 (L) 98 - 107 mmol/L    CO2 31.0 (H) 22.0 - 30.0 mmol/L    BUN 17 7 - 21 mg/dL    Creatinine 2.13 0.86 - 1.30 mg/dL    BUN/Creatinine Ratio 21     EGFR CKD-EPI Non-African American, Male >90 >=60 mL/min/1.54m2    EGFR CKD-EPI African American, Male >90 >=60 mL/min/1.49m2    Glucose 256 (H) 65 - 179 mg/dL    Calcium 8.7 8.5 - 57.8 mg/dL    Albumin 3.4 (L) 3.5 - 5.0 g/dL    Total Protein 6.2 (L) 6.5 - 8.3 g/dL    Total Bilirubin 0.5 0.0 - 1.2 mg/dL    AST 20 19 - 55 U/L    ALT 32 19 - 72 U/L    Alkaline Phosphatase 52 38 - 126 U/L Anion Gap 8 7 - 15 mmol/L   Toxicology Screen, Urine    Collection Time: 09/03/18 12:25 AM   Result Value Ref Range    Amphetamine Screen, Ur <500 ng/mL Not Applicable    Barbiturate Screen, Ur <200 ng/mL Not Applicable    Benzodiazepine Screen, Urine <200 ng/mL Not Applicable    Cannabinoid Scrn, Ur <20 ng/mL Not Applicable    Methadone Screen, Urine <300 ng/mL Not Applicable    Cocaine(Metab.)Screen, Urine <150 ng/mL Not Applicable    Opiate Scrn, Ur =/>300 ng/mL (A) Not Applicable   Urinalysis    Collection Time: 09/03/18 12:25 AM   Result Value Ref Range    Color, UA Yellow     Clarity, UA Clear     Specific Gravity, UA 1.029 1.003 - 1.030    pH, UA 6.0 5.0 - 9.0    Leukocyte Esterase, UA Negative Negative    Nitrite, UA Negative Negative    Protein, UA 30 mg/dL (A) Negative    Glucose, UA 150 mg/dL (A) Negative    Ketones, UA Trace (A) Negative    Urobilinogen, UA 0.2 mg/dL 0.2 mg/dL, 1.0 mg/dL    Bilirubin, UA Negative Negative    Blood, UA Negative Negative    RBC, UA <1 <=3 /HPF    WBC, UA 1 <=2 /HPF    Squam Epithel, UA <1 0 - 5 /HPF  Bacteria, UA Rare (A) None Seen /HPF    Mucus, UA Rare (A) None Seen /HPF   Valproic Acid Level    Collection Time: 09/03/18  3:13 AM   Result Value Ref Range    Valproic Acid, Total 40.6 (L) 50.0 - 100.0 ug/mL   Ammonia    Collection Time: 09/03/18  3:14 AM   Result Value Ref Range    Ammonia 14 9 - 33 umol/L   POCT Glucose    Collection Time: 09/03/18  5:59 AM   Result Value Ref Range    Glucose, POC 272 (H) 65 - 179 mg/dL       CT Head wo con, 1/61/0960, personally reviewed:  No acute abnormality.

## 2018-09-03 NOTE — Unmapped (Addendum)
Patient drowsy/ lethargic in Am. Alert and orientated x4 around 1200. All VSS. Patient on 45% ventilator with adequate oxygenation saturation. Patient in NSR/ST. ACHS blood sugar checks maintained. Coverage required .PEG in place with medications crushed and put through. BM today. Adequate urine output. Skin is clean, dry, and intact other than redness around PEG tube. Right chest port remains in place. Lovenox given for VTE prophylaxis. Ibuprofen added for PRN pain control of right ribs .Oxycodone decreased to 5mg  due to patient's neurological status over night. Bed low and locked. Call bell within reach.       Problem: Adult Inpatient Plan of Care  Goal: Plan of Care Review  Outcome: Ongoing - Unchanged  Goal: Patient-Specific Goal (Individualization)  Outcome: Ongoing - Unchanged  Goal: Absence of Hospital-Acquired Illness or Injury  Outcome: Ongoing - Unchanged  Goal: Optimal Comfort and Wellbeing  Outcome: Ongoing - Unchanged  Goal: Readiness for Transition of Care  Outcome: Ongoing - Unchanged  Goal: Rounds/Family Conference  Outcome: Ongoing - Unchanged     Problem: Pain Chronic (Persistent) (Comorbidity Management)  Goal: Acceptable Pain Control and Functional Ability  Outcome: Ongoing - Unchanged     Problem: Device-Related Complication Risk (Artificial Airway)  Goal: Optimal Device Function  Outcome: Ongoing - Unchanged     Problem: Skin and Tissue Injury (Artificial Airway)  Goal: Absence of Device-Related Skin or Tissue Injury  Outcome: Ongoing - Unchanged     Problem: Breathing Pattern Ineffective  Goal: Effective Breathing Pattern  Outcome: Ongoing - Unchanged     Problem: Inability to Wean (Mechanical Ventilation, Invasive)  Goal: Mechanical Ventilation Liberation  Outcome: Ongoing - Unchanged     Problem: Ventilator-Induced Lung Injury (Mechanical Ventilation, Invasive)  Goal: Absence of Ventilator-Induced Lung Injury  Outcome: Ongoing - Unchanged     Problem: Self-Care Deficit  Goal: Improved Ability to Complete Activities of Daily Living  Outcome: Ongoing - Unchanged

## 2018-09-04 DIAGNOSIS — R0602 Shortness of breath: Principal | ICD-10-CM

## 2018-09-04 LAB — CBC
HEMATOCRIT: 28.6 % — ABNORMAL LOW (ref 41.0–53.0)
HEMOGLOBIN: 9.2 g/dL — ABNORMAL LOW (ref 13.5–17.5)
MEAN CORPUSCULAR HEMOGLOBIN CONC: 32.1 g/dL (ref 31.0–37.0)
MEAN CORPUSCULAR HEMOGLOBIN: 23.8 pg — ABNORMAL LOW (ref 26.0–34.0)
MEAN CORPUSCULAR VOLUME: 74 fL — ABNORMAL LOW (ref 80.0–100.0)
MEAN PLATELET VOLUME: 7.3 fL (ref 7.0–10.0)
RED BLOOD CELL COUNT: 3.86 10*12/L — ABNORMAL LOW (ref 4.50–5.90)
RED CELL DISTRIBUTION WIDTH: 16.8 % — ABNORMAL HIGH (ref 12.0–15.0)
WBC ADJUSTED: 6.7 10*9/L (ref 4.5–11.0)

## 2018-09-04 LAB — C-REACTIVE PROTEIN: C reactive protein:MCnc:Pt:Ser/Plas:Qn:: 155.5 — ABNORMAL HIGH

## 2018-09-04 LAB — EGFR CKD-EPI NON-AA MALE: Lab: >90

## 2018-09-04 LAB — BASIC METABOLIC PANEL
ANION GAP: 6 mmol/L — ABNORMAL LOW (ref 7–15)
BLOOD UREA NITROGEN: 14 mg/dL (ref 7–21)
BUN / CREAT RATIO: 26
CALCIUM: 9.1 mg/dL (ref 8.5–10.2)
CHLORIDE: 100 mmol/L (ref 98–107)
CO2: 33 mmol/L — ABNORMAL HIGH (ref 22.0–30.0)
CREATININE: 0.54 mg/dL — ABNORMAL LOW (ref 0.70–1.30)
EGFR CKD-EPI AA MALE: >90 mL/min/{1.73_m2} (ref >=60)
EGFR CKD-EPI NON-AA MALE: >90 mL/min/{1.73_m2} (ref >=60)
POTASSIUM: 4.3 mmol/L (ref 3.5–5.0)
SODIUM: 139 mmol/L (ref 135–145)

## 2018-09-04 LAB — RED CELL DISTRIBUTION WIDTH: Lab: 16.8 — ABNORMAL HIGH

## 2018-09-04 LAB — MAGNESIUM: Magnesium:MCnc:Pt:Ser/Plas:Qn:: 1.7

## 2018-09-04 NOTE — Unmapped (Signed)
Pt alert and oriented, no complaints of pain. VSS. Remains on 40% vent. Will continue to monitor.    Problem: Adult Inpatient Plan of Care  Goal: Plan of Care Review  Outcome: Progressing  Goal: Patient-Specific Goal (Individualization)  Outcome: Progressing  Goal: Absence of Hospital-Acquired Illness or Injury  Outcome: Progressing  Goal: Optimal Comfort and Wellbeing  Outcome: Progressing  Goal: Readiness for Transition of Care  Outcome: Progressing  Goal: Rounds/Family Conference  Outcome: Progressing     Problem: Pain Chronic (Persistent) (Comorbidity Management)  Goal: Acceptable Pain Control and Functional Ability  Outcome: Progressing     Problem: Communication Impairment (Artificial Airway)  Goal: Effective Communication  Outcome: Progressing     Problem: Device-Related Complication Risk (Artificial Airway)  Goal: Optimal Device Function  Outcome: Progressing     Problem: Skin and Tissue Injury (Artificial Airway)  Goal: Absence of Device-Related Skin or Tissue Injury  Outcome: Progressing     Problem: Breathing Pattern Ineffective  Goal: Effective Breathing Pattern  Outcome: Progressing     Problem: Inability to Wean (Mechanical Ventilation, Invasive)  Goal: Mechanical Ventilation Liberation  Outcome: Progressing     Problem: Ventilator-Induced Lung Injury (Mechanical Ventilation, Invasive)  Goal: Absence of Ventilator-Induced Lung Injury  Outcome: Progressing     Problem: Self-Care Deficit  Goal: Improved Ability to Complete Activities of Daily Living  Outcome: Progressing     Problem: Fall Injury Risk  Goal: Absence of Fall and Fall-Related Injury  Outcome: Progressing

## 2018-09-04 NOTE — Unmapped (Signed)
Med G Daily Progress Note    Interval History/Subjective:  BCx NG48h. No bowel movements since SMOG. Has not yet worked with physical therapy. Back pain unchanged.     Assessment/Plan:    Principal Problem:    Dyspnea  Active Problems:    Asthma    Hypothyroidism    Chronic tracheobronchitis (CMS-HCC)    Tracheostomy dependence (CMS-HCC)    VATER syndrome    PEG (percutaneous endoscopic gastrostomy) status (CMS-HCC)    History of home ventilator (CMS-HCC)    Bipolar 1 disorder (CMS-HCC)  Resolved Problems:    * No resolved hospital problems. *      Cameron Baker is a 27 y.o. male with a history of VATER syndrome with tracheobronchomalacia s/p PEG and tracheostomy with nocturnal ventilation c/b recurrent tracheobronchitis, Bipolar I disorder, asthma, and hypothyroidism that presented to [facility] with Dyspnea, likely either recurrent tracheobronchitis vs asthma exacerbation. Current hospital course c/b sepsis in the setting of possible mucus plugging or aspiration.    Sepsis- c/f for pulmonary source in the setting of known mucus plugging and possible aspiration event. S/p IVF resuscitation and given broad spectrum abx. Now clinically stable but still with oxygen requirement.  - Switching to oral levaquin as blood cultures have yielded no organism in 48 hrs  - Repeat CXR 09/28 with improved pulmonary edema   - Repeat CRP pending  ??  Asthma Exacerbation: Most likely etiology given wheezing, and having recently completed antibiotic therapy for pneumonia with improvement on imaging.   - Sputum culture w/ mixed gram positive and gram negative organisms  - Taper prednisone to 30mg  (9/29)  - Continue LABA/ICS as below  - Scheduled atrovent, prn albuterol     Back pain  Paraspinal tenderness on exam causing severe discomfort. Has been bed-bound.  - Physical therapy consulted  - Pain control with bengay, scheduled tylenol, prn ibuprofen for mild/moderate pain, prn oxycodone 5mg      VATER with associated tracheobronchomalacia s/p trach and PEG, on chronic nocturnal vent  - Continue LABA/ICS  - Airway clearance   - Hold home azithromycin MWF while on antibiotics  - Continue home Trilogy vent qhs (AVAPS, VT 525 ml, PEEP 10 cm H2O)  ??  Bipolar I: Continue home citalopram, valproate, mirtazapine, and lorazepam  Hypothyroidism: Continue home synthroid daily  Microcytic anemia: at baseline, no in hospital interventions.  Slow transit constipation: continued on??bentyl QID prn and daily miralax + SMOG enemas   Lower extremity muscle spasms: Continued on home??cyclobenzaprine prn  Acne:??Home minocycline   ??  DVT ppx: lovenox   Code Status: Full Code  Dispo: Med G, stepdown given nocturnal vent  ___________________________________________________________________    Labs/Studies:  Labs and Studies from the last 24hrs per EMR and Reviewed    Objective:  Temp:  [36.6 ??C-36.9 ??C] 36.8 ??C  Heart Rate:  [82-108] 86  SpO2 Pulse:  [82-107] 86  Resp:  [12-25] 19  BP: (119-152)/(65-87) 138/83  FiO2 (%):  [40 %-55 %] 40 %  SpO2:  [95 %-100 %] 99 %, I/O this shift:  In: 0   Out: 325 [Urine:325],   Wt Readings from Last 3 Encounters:   09/03/18 (!) 109 kg (240 lb 4.8 oz)   08/20/18 (!) 103.9 kg (229 lb 0.9 oz)   08/06/18 95.7 kg (211 lb)       General: Obese young male, lying in bed on vent, sitting up  Neuro: Alert and oriented x 3.  NECK: trachea midline with tracheostomy in place  RESP: Coarse breath sounds bilaterally. No wheezing.  CV: Normal rate, regular rhythm., warm extremities, 2+ radial pulsus  Abdomen: No palpable mass or deformity. RUQ tenderness to palpation. Normal bowel sounds.

## 2018-09-04 NOTE — Unmapped (Signed)
Patient remained on Trilogy throughout shift with no changes in settings.  Bivona trach is patent, skin intact.  Patient refused ETS, no distress noted.

## 2018-09-04 NOTE — Unmapped (Signed)
Patient alert and orientated x4.  All VSS. Patient on 40% ventilator and 40% trach collar with adequate oxygenation saturation. Patient in NSR/ST. ACHS blood sugar checks maintained. Coverage required .PEG in place with medications crushed and put through. Large BM today. Adequate urine output. Skin is clean, dry, and intact other than redness around PEG tube. Right chest port remains in place. Lovenox given for VTE prophylaxis. Ibuprofen given PRN for pain control of right ribs . Bath given. Bed low and locked. Call bell within reach.           Problem: Adult Inpatient Plan of Care  Goal: Plan of Care Review  Outcome: Progressing  Goal: Patient-Specific Goal (Individualization)  Outcome: Progressing  Goal: Absence of Hospital-Acquired Illness or Injury  Outcome: Progressing  Goal: Optimal Comfort and Wellbeing  Outcome: Progressing  Goal: Readiness for Transition of Care  Outcome: Progressing  Goal: Rounds/Family Conference  Outcome: Progressing     Problem: Pain Chronic (Persistent) (Comorbidity Management)  Goal: Acceptable Pain Control and Functional Ability  Outcome: Progressing     Problem: Communication Impairment (Artificial Airway)  Goal: Effective Communication  Outcome: Progressing     Problem: Device-Related Complication Risk (Artificial Airway)  Goal: Optimal Device Function  Outcome: Progressing     Problem: Skin and Tissue Injury (Artificial Airway)  Goal: Absence of Device-Related Skin or Tissue Injury  Outcome: Progressing     Problem: Breathing Pattern Ineffective  Goal: Effective Breathing Pattern  Outcome: Progressing     Problem: Inability to Wean (Mechanical Ventilation, Invasive)  Goal: Mechanical Ventilation Liberation  Outcome: Progressing     Problem: Ventilator-Induced Lung Injury (Mechanical Ventilation, Invasive)  Goal: Absence of Ventilator-Induced Lung Injury  Outcome: Progressing     Problem: Self-Care Deficit  Goal: Improved Ability to Complete Activities of Daily Living  Outcome: Progressing     Problem: Fall Injury Risk  Goal: Absence of Fall and Fall-Related Injury  Outcome: Progressing

## 2018-09-05 LAB — CBC
HEMATOCRIT: 30.3 % — ABNORMAL LOW (ref 41.0–53.0)
HEMOGLOBIN: 9.6 g/dL — ABNORMAL LOW (ref 13.5–17.5)
MEAN CORPUSCULAR HEMOGLOBIN CONC: 31.7 g/dL (ref 31.0–37.0)
MEAN CORPUSCULAR VOLUME: 73.6 fL — ABNORMAL LOW (ref 80.0–100.0)
MEAN PLATELET VOLUME: 7.5 fL (ref 7.0–10.0)
RED BLOOD CELL COUNT: 4.12 10*12/L — ABNORMAL LOW (ref 4.50–5.90)
RED CELL DISTRIBUTION WIDTH: 16.9 % — ABNORMAL HIGH (ref 12.0–15.0)
WBC ADJUSTED: 8.9 10*9/L (ref 4.5–11.0)

## 2018-09-05 LAB — PLATELET COUNT: Lab: 392

## 2018-09-05 LAB — BASIC METABOLIC PANEL
BLOOD UREA NITROGEN: 15 mg/dL (ref 7–21)
BUN / CREAT RATIO: 24
CHLORIDE: 96 mmol/L — ABNORMAL LOW (ref 98–107)
CO2: 34 mmol/L — ABNORMAL HIGH (ref 22.0–30.0)
CREATININE: 0.62 mg/dL — ABNORMAL LOW (ref 0.70–1.30)
EGFR CKD-EPI AA MALE: >90 mL/min/{1.73_m2} (ref >=60)
EGFR CKD-EPI NON-AA MALE: >90 mL/min/{1.73_m2} (ref >=60)
GLUCOSE RANDOM: 124 mg/dL (ref 65–179)
POTASSIUM: 4.3 mmol/L (ref 3.5–5.0)
SODIUM: 136 mmol/L (ref 135–145)

## 2018-09-05 LAB — CREATININE: Creatinine:MCnc:Pt:Ser/Plas:Qn:: 0.62 — ABNORMAL LOW

## 2018-09-05 LAB — MAGNESIUM: Magnesium:MCnc:Pt:Ser/Plas:Qn:: 1.7

## 2018-09-05 NOTE — Unmapped (Signed)
Pt alert and oriented, no complaints of pain. Resting comfortably on ventilator. VSS. Mother at bedside. Will continue to monitor.      Problem: Adult Inpatient Plan of Care  Goal: Plan of Care Review  Outcome: Progressing  Goal: Patient-Specific Goal (Individualization)  Outcome: Progressing  Goal: Absence of Hospital-Acquired Illness or Injury  Outcome: Progressing  Goal: Optimal Comfort and Wellbeing  Outcome: Progressing  Goal: Readiness for Transition of Care  Outcome: Progressing  Goal: Rounds/Family Conference  Outcome: Progressing     Problem: Pain Chronic (Persistent) (Comorbidity Management)  Goal: Acceptable Pain Control and Functional Ability  Outcome: Progressing     Problem: Communication Impairment (Artificial Airway)  Goal: Effective Communication  Outcome: Progressing     Problem: Device-Related Complication Risk (Artificial Airway)  Goal: Optimal Device Function  Outcome: Progressing     Problem: Skin and Tissue Injury (Artificial Airway)  Goal: Absence of Device-Related Skin or Tissue Injury  Outcome: Progressing     Problem: Breathing Pattern Ineffective  Goal: Effective Breathing Pattern  Outcome: Progressing     Problem: Inability to Wean (Mechanical Ventilation, Invasive)  Goal: Mechanical Ventilation Liberation  Outcome: Progressing     Problem: Ventilator-Induced Lung Injury (Mechanical Ventilation, Invasive)  Goal: Absence of Ventilator-Induced Lung Injury  Outcome: Progressing     Problem: Self-Care Deficit  Goal: Improved Ability to Complete Activities of Daily Living  Outcome: Progressing     Problem: Fall Injury Risk  Goal: Absence of Fall and Fall-Related Injury  Outcome: Progressing     Problem: Adult Inpatient Plan of Care  Goal: Plan of Care Review  Outcome: Progressing  Goal: Patient-Specific Goal (Individualization)  Outcome: Progressing  Goal: Absence of Hospital-Acquired Illness or Injury  Outcome: Progressing  Goal: Optimal Comfort and Wellbeing  Outcome: Progressing  Goal: Readiness for Transition of Care  Outcome: Progressing  Goal: Rounds/Family Conference  Outcome: Progressing     Problem: Pain Chronic (Persistent) (Comorbidity Management)  Goal: Acceptable Pain Control and Functional Ability  Outcome: Progressing     Problem: Communication Impairment (Artificial Airway)  Goal: Effective Communication  Outcome: Progressing     Problem: Device-Related Complication Risk (Artificial Airway)  Goal: Optimal Device Function  Outcome: Progressing     Problem: Skin and Tissue Injury (Artificial Airway)  Goal: Absence of Device-Related Skin or Tissue Injury  Outcome: Progressing     Problem: Breathing Pattern Ineffective  Goal: Effective Breathing Pattern  Outcome: Progressing     Problem: Inability to Wean (Mechanical Ventilation, Invasive)  Goal: Mechanical Ventilation Liberation  Outcome: Progressing     Problem: Ventilator-Induced Lung Injury (Mechanical Ventilation, Invasive)  Goal: Absence of Ventilator-Induced Lung Injury  Outcome: Progressing     Problem: Self-Care Deficit  Goal: Improved Ability to Complete Activities of Daily Living  Outcome: Progressing     Problem: Fall Injury Risk  Goal: Absence of Fall and Fall-Related Injury  Outcome: Progressing

## 2018-09-05 NOTE — Unmapped (Signed)
Pt A&O x4. VSS. 35% vent at night and 35% TC during day. Complained of pain on R ribcage, scheduled tylenol and lidocaine patch given. SQ lovenox given for VTE prophylaxis. Pt ambulatory without assist. Pt ambulated to gift shop with mother. No BM this shift. Mother at bedside for majority of shift. Standard precautions maintained. Bed low, call bell and bedside table within reach. Will continue to monitor.     Problem: Adult Inpatient Plan of Care  Goal: Plan of Care Review  Outcome: Progressing  Goal: Patient-Specific Goal (Individualization)  Outcome: Progressing  Goal: Absence of Hospital-Acquired Illness or Injury  Outcome: Progressing  Goal: Optimal Comfort and Wellbeing  Outcome: Progressing  Goal: Readiness for Transition of Care  Outcome: Progressing  Goal: Rounds/Family Conference  Outcome: Progressing     Problem: Pain Chronic (Persistent) (Comorbidity Management)  Goal: Acceptable Pain Control and Functional Ability  Outcome: Progressing     Problem: Communication Impairment (Artificial Airway)  Goal: Effective Communication  Outcome: Progressing     Problem: Device-Related Complication Risk (Artificial Airway)  Goal: Optimal Device Function  Outcome: Progressing     Problem: Skin and Tissue Injury (Artificial Airway)  Goal: Absence of Device-Related Skin or Tissue Injury  Outcome: Progressing     Problem: Breathing Pattern Ineffective  Goal: Effective Breathing Pattern  Outcome: Progressing     Problem: Inability to Wean (Mechanical Ventilation, Invasive)  Goal: Mechanical Ventilation Liberation  Outcome: Progressing     Problem: Ventilator-Induced Lung Injury (Mechanical Ventilation, Invasive)  Goal: Absence of Ventilator-Induced Lung Injury  Outcome: Progressing     Problem: Self-Care Deficit  Goal: Improved Ability to Complete Activities of Daily Living  Outcome: Progressing     Problem: Fall Injury Risk  Goal: Absence of Fall and Fall-Related Injury  Outcome: Progressing

## 2018-09-05 NOTE — Unmapped (Signed)
Patient stated using the Metaneb for airway clearance hurts his ribs. I offered another airway clearance, the chest vest, he stated he's able to tolerate the Metaneb intermittently and would like to build back up to using it. Will continue to monitor.

## 2018-09-05 NOTE — Unmapped (Signed)
Med G Daily Progress Note    Interval History/Subjective:  NAEO. States his breathing is better. Reports no chest pain.     Assessment/Plan:    Principal Problem:    Dyspnea  Active Problems:    Asthma    Hypothyroidism    Chronic tracheobronchitis (CMS-HCC)    Tracheostomy dependence (CMS-HCC)    VATER syndrome    PEG (percutaneous endoscopic gastrostomy) status (CMS-HCC)    History of home ventilator (CMS-HCC)    Bipolar 1 disorder (CMS-HCC)  Resolved Problems:    * No resolved hospital problems. *      Cameron Baker is a 27 y.o. male with a history of VATER syndrome with tracheobronchomalacia s/p PEG and tracheostomy with nocturnal ventilation c/b recurrent tracheobronchitis, Bipolar I disorder, asthma, and hypothyroidism that presented to [facility] with dyspnea, likely either recurrent tracheobronchitis vs asthma exacerbation. Current hospital course c/b sepsis in the setting of possible mucus plugging or aspiration.    Sepsis- c/f for pulmonary source in the setting of known mucus plugging and possible aspiration event. S/p IVF resuscitation and given broad spectrum abx. Now clinically stable but still with oxygen requirement.  - Continue PO Levaquin and bactrim given history of stenotrophomonas   - Repeat CXR 09/28 with improved pulmonary edema   ??  Asthma Exacerbation: Most likely etiology given wheezing, and having recently completed antibiotic therapy for pneumonia with improvement on imaging.   - Sputum culture w/ mixed gram positive and gram negative organisms  - Taper prednisone to 30mg  (9/29)  - Continue LABA/ICS as below  - Scheduled atrovent, prn albuterol     Back pain  Paraspinal tenderness on exam causing severe discomfort. Has been bed-bound.  - Physical therapy consulted  - Pain control with bengay, scheduled tylenol, prn ibuprofen for mild/moderate pain, prn oxycodone 5mg      VATER with associated tracheobronchomalacia s/p trach and PEG, on chronic nocturnal vent  - Continue LABA/ICS  - Airway clearance   - Hold home azithromycin MWF while on antibiotics  - Continue home Trilogy vent qhs (AVAPS, VT 525 ml, PEEP 10 cm H2O)  ??  Bipolar I: Continue home citalopram, valproate, mirtazapine, and lorazepam  Hypothyroidism: Continue home synthroid daily  Microcytic anemia: at baseline, no in hospital interventions.  Slow transit constipation: continued on??bentyl QID prn and daily miralax + enemas prn   Lower extremity muscle spasms: Continued on home??cyclobenzaprine prn  Acne:??Home minocycline   ??  DVT ppx: lovenox   Code Status: Full Code  Dispo: Med G, stepdown given nocturnal vent  ___________________________________________________________________    Labs/Studies:  Labs and Studies from the last 24hrs per EMR and Reviewed    Objective:  Temp:  [35.9 ??C-36.9 ??C] 36.7 ??C  Heart Rate:  [63-108] 80  SpO2 Pulse:  [63-107] 88  Resp:  [11-26] 14  BP: (106-141)/(57-81) 141/81  FiO2 (%):  [35 %-40 %] 35 %  SpO2:  [92 %-99 %] 98 %, I/O this shift:  In: 420 [P.O.:300; NG/GT:120]  Out: 400 [Urine:400],   Wt Readings from Last 3 Encounters:   09/04/18 (!) 107.1 kg (236 lb 1.8 oz)   08/20/18 (!) 103.9 kg (229 lb 0.9 oz)   08/06/18 95.7 kg (211 lb)       General: Obese young male, siting up in chair   Neuro: Alert and oriented x 3.  NECK: trachea midline with tracheostomy in place  RESP: Coarse breath sounds bilaterally. No wheezing.  CV: Normal rate, regular rhythm., warm extremities, 2+ radial  pulsus  Abdomen: No palpable mass or deformity. RUQ tenderness to palpation. Normal bowel sounds.

## 2018-09-05 NOTE — Unmapped (Signed)
Inhaled medications given, tolerated well.  BIVONA trach with foam cuff is patent.  Little ETS needed.  Remained stable throughout shift on Trilogy on AVAPS settings.

## 2018-09-06 DIAGNOSIS — R0602 Shortness of breath: Principal | ICD-10-CM

## 2018-09-06 LAB — SPECIMEN SOURCE

## 2018-09-06 LAB — CBC
HEMATOCRIT: 32.1 % — ABNORMAL LOW (ref 41.0–53.0)
HEMATOCRIT: 41.6 % (ref 41.0–53.0)
HEMOGLOBIN: 9.9 g/dL — ABNORMAL LOW (ref 13.5–17.5)
HEMOGLOBIN: 11.9 g/dL — ABNORMAL LOW (ref 13.5–17.5)
MEAN CORPUSCULAR HEMOGLOBIN CONC: 28.6 g/dL — ABNORMAL LOW (ref 31.0–37.0)
MEAN CORPUSCULAR HEMOGLOBIN: 22.9 pg — ABNORMAL LOW (ref 26.0–34.0)
MEAN CORPUSCULAR HEMOGLOBIN: 23.7 pg — ABNORMAL LOW (ref 26.0–34.0)
MEAN CORPUSCULAR VOLUME: 74 fL — ABNORMAL LOW (ref 80.0–100.0)
MEAN CORPUSCULAR VOLUME: 82.6 fL (ref 80.0–100.0)
MEAN PLATELET VOLUME: 9.2 fL (ref 7.0–10.0)
NUCLEATED RED BLOOD CELLS: 3 /100{WBCs} (ref <=4)
PLATELET COUNT: 389 10*9/L (ref 150–440)
PLATELET COUNT: 197 10*9/L (ref 150–440)
RED BLOOD CELL COUNT: 4.34 10*12/L — ABNORMAL LOW (ref 4.50–5.90)
RED BLOOD CELL COUNT: 5.04 10*12/L (ref 4.50–5.90)
RED CELL DISTRIBUTION WIDTH: 17.1 % — ABNORMAL HIGH (ref 12.0–15.0)
RED CELL DISTRIBUTION WIDTH: 16.6 % — ABNORMAL HIGH (ref 12.0–15.0)
WBC ADJUSTED: 10.6 10*9/L (ref 4.5–11.0)

## 2018-09-06 LAB — ANION GAP: Anion gap 3:SCnc:Pt:Ser/Plas:Qn:: 10

## 2018-09-06 LAB — BASIC METABOLIC PANEL
ANION GAP: 10 mmol/L (ref 7–15)
ANION GAP: 26 mmol/L — ABNORMAL HIGH (ref 7–15)
BLOOD UREA NITROGEN: 14 mg/dL (ref 7–21)
BUN / CREAT RATIO: 20
BUN / CREAT RATIO: 14
CALCIUM: 9 mg/dL (ref 8.5–10.2)
CALCIUM: 14.3 mg/dL (ref 8.5–10.2)
CHLORIDE: 99 mmol/L (ref 98–107)
CHLORIDE: 96 mmol/L — ABNORMAL LOW (ref 98–107)
CO2: 24 mmol/L (ref 22.0–30.0)
CREATININE: 0.71 mg/dL (ref 0.70–1.30)
CREATININE: 1.34 mg/dL — ABNORMAL HIGH (ref 0.70–1.30)
EGFR CKD-EPI AA MALE: >90 mL/min/{1.73_m2} (ref >=60)
EGFR CKD-EPI AA MALE: 85 mL/min/{1.73_m2} (ref >=60)
EGFR CKD-EPI NON-AA MALE: >90 mL/min/{1.73_m2} (ref >=60)
GLUCOSE RANDOM: 139 mg/dL (ref 65–179)
GLUCOSE RANDOM: 425 mg/dL (ref 65–179)
POTASSIUM: 4.2 mmol/L (ref 3.5–5.0)
POTASSIUM: 5.7 mmol/L — ABNORMAL HIGH (ref 3.5–5.0)
SODIUM: 137 mmol/L (ref 135–145)
SODIUM: 146 mmol/L — ABNORMAL HIGH (ref 135–145)

## 2018-09-06 LAB — APTT
Coagulation surface induced:Time:Pt:PPP:Qn:Coag: 36.2
HEPARIN CORRELATION: 0.2

## 2018-09-06 LAB — BLOOD GAS CRITICAL CARE PANEL, VENOUS
CALCIUM IONIZED VENOUS (MG/DL): 7.92 mg/dL (ref 4.40–5.40)
LACTATE BLOOD VENOUS: 21 mmol/L (ref 0.5–1.8)
PCO2 VENOUS: >100 mmHg (ref 40–60)
PH VENOUS: <6.83 — CL (ref 7.32–7.43)
PO2 VENOUS: <20 mmHg — ABNORMAL LOW (ref 30–55)
POTASSIUM WHOLE BLOOD: 5.6 mmol/L — ABNORMAL HIGH (ref 3.4–4.6)
SODIUM WHOLE BLOOD: 151 mmol/L — ABNORMAL HIGH (ref 135–145)

## 2018-09-06 LAB — WBC ADJUSTED: Lab: 17.3 — ABNORMAL HIGH

## 2018-09-06 LAB — MAGNESIUM: Magnesium:MCnc:Pt:Ser/Plas:Qn:: 1.7

## 2018-09-06 LAB — EGFR CKD-EPI NON-AA MALE: Lab: 73

## 2018-09-06 LAB — PLATELET COUNT: Lab: 389

## 2018-09-06 NOTE — Unmapped (Signed)
Received PT orders.  Patient has been ambulatory--to gift shop and back earlier today.  No unsteadiness or mobility concerns from patient or RN. I personally met with both to discuss. Will d/c from caseload.  Please reconsult if new needs arise. Thanks!

## 2018-09-06 NOTE — Unmapped (Addendum)
Med G Daily Progress Note    Interval History/Subjective:    Overnight: No acute events. Requested and tolerated 30% FiO2 overnight (from 35% prior) with intermittent room air when awake.     Paged by RN at 12:20 with information that patient had ambulated around the unit and was complaining of 8/10 pain in his right rib at rest. Patient had been having right-sided rib pain for the last several days with intermittent tachycardia. CTA chest, US abdomen & CMP had been ordered to work this up and had been unrevealing. Oxycodone was ordered. Per RT, he desaturated when ambulating to gift shop. He was placed back on ventilator on 35% FiO2.     Paged by RN at 3:25PM with information that patient was anxious with hard work of breathing. He had temporarily been placed on 70% FIO2, which was weaned down quickly. I came down to assess patient. He was tachycardic in the 120s and his oxygen saturation was 99% on FiO2 of 35%. Patient stated he was on the phone with his mother when he became acutely short of breath with pleuritic pain in bilateral ribs. He said he felt hot. I provided cold towels and ordered STAT CXR & EKG at 3:34 PM to investigate new chest pain. CXR showed no acute abnormality. EKG showed sinus tachycardia.     Assessment/Plan:    Principal Problem:    Dyspnea  Active Problems:    Asthma    Hypothyroidism    Chronic tracheobronchitis (CMS-HCC)    Tracheostomy dependence (CMS-HCC)    VATER syndrome    PEG (percutaneous endoscopic gastrostomy) status (CMS-HCC)    History of home ventilator (CMS-HCC)    Bipolar 1 disorder (CMS-HCC)  Resolved Problems:    * No resolved hospital problems. *      Cameron Baker is a 27 y.o. male with a history of VATER syndrome with tracheobronchomalacia s/p PEG and tracheostomy with nocturnal ventilation c/b recurrent tracheobronchitis, Bipolar I disorder, asthma, and hypothyroidism that presented to [facility] with dyspnea, likely either recurrent tracheobronchitis vs asthma exacerbation. Current hospital course c/b sepsis in the setting of possible mucus plugging or aspiration.    Sepsis- c/f for pulmonary source in the setting of known mucus plugging and possible aspiration event. S/p IVF resuscitation and given broad spectrum abx. Now clinically stable but still with oxygen requirement.  - Continue PO Levaquin and bactrim given history of stenotrophomonas of total 7 day effective therapy after last aspiration event   - Repeat CXR 09/28 with improved pulmonary edema   ??  Asthma Exacerbation: Most likely etiology given wheezing, and having recently completed antibiotic therapy for pneumonia with improvement on imaging.   - Sputum culture w/ mixed gram positive and gram negative organisms  - Taper prednisone to 30mg  (9/29) and then by 10mg  every week  - Continue LABA/ICS as below  - Scheduled atrovent, prn albuterol     VATER with associated tracheobronchomalacia s/p trach and PEG, on chronic nocturnal vent  - Continue LABA/ICS  - Airway clearance   - Hold home azithromycin MWF while on antibiotics  - Continue home Trilogy vent qhs (AVAPS, VT 525 ml, PEEP 10 cm H2O)  ??  Bipolar I: Continue home citalopram, valproate, mirtazapine, and lorazepam  Hypothyroidism: Continue home synthroid daily  Microcytic anemia: at baseline, no in hospital interventions.  Slow transit constipation: continued on??bentyl QID prn and daily miralax + enemas prn   Lower extremity muscle spasms: Continued on home??cyclobenzaprine prn  Acne:??Home minocycline   ??  DVT ppx: lovenox   Code Status: Full Code  Dispo: Med G, stepdown given nocturnal vent  ___________________________________________________________________    Labs/Studies:  Labs and Studies from the last 24hrs per EMR and Reviewed    Objective:  Temp:  [36.4 ??C-36.9 ??C] 36.7 ??C  Heart Rate:  [72-126] 111  SpO2 Pulse:  [73-125] 110  Resp:  [12-33] 33  BP: (107-141)/(56-80) 107/56  FiO2 (%):  [30 %-35 %] 35 %  SpO2:  [70 %-99 %] 98 %, I/O this shift: In: 540 [P.O.:300; NG/GT:240]  Out: 550 [Urine:550],   Wt Readings from Last 3 Encounters:   09/04/18 (!) 107.1 kg (236 lb 1.8 oz)   08/20/18 (!) 103.9 kg (229 lb 0.9 oz)   08/06/18 95.7 kg (211 lb)       General: Obese young male, siting up in chair   Neuro: Alert and oriented x 3.  NECK: trachea midline with tracheostomy in place  RESP: Coarse breath sounds bilaterally. No wheezing.  CV: Tachycardic. Regular rhythm., warm extremities, 2+ radial pulsus  Abdomen: No palpable mass or deformity. RUQ tenderness to palpation. Normal bowel sounds.       ______________________________________________________________________    Teaching Attending Attestation     I saw the patient with Dr. Regenia Skeeter. We formulated a joint history as documented above. I examined the patient fully, evaluated the test results, and formulated the above assessment and plan as documented in the resident note. I agree with the plan for treatment and follow up as outlined.     Active Inpatient Problem List includes:  1. Aspiration pneumonia  2. VATER syndrome requiring trach/vent qhs  3. Recurrent esophageal strictures  4. Bronchiectasis  5. Anemia  6. Asthma    Plan for today includes: Fluctuating respiratory status over the course of the day. Patient concerned about microaspirations. Will review workup for aspiration in the past. Continue on ABX - has 2 more days planned. SMOG enema prn.      I personally spent over half of a total 30 minutes in counseling and discussion with the patient and coordination of care as described above. Patient updated at bedside.    Harrel Carina, MD  Pulmonary/Critical Care Medicine  Pediatric Pulmonology

## 2018-09-06 NOTE — Unmapped (Signed)
Pt is alert and oriented x 4. VSS on 35% trach collar and intermittent RA while awake and 35% vent during sleep. Urine output adequate. No BM this shift. Intake adequate. Pt reported pain 1x this shift treated w/ PRN oxycodone oral solution 5mg . Pt requested phenergan for nausea this shift. Pt ambulated unit this shift. All safety precautions in place. Pt has remained free from falls and injuries so far this shift. Rounds performed. Turns promoted. Will continue to monitor.

## 2018-09-06 NOTE — Unmapped (Signed)
Patient was compliant with all inhaled treatments this shift. Patient was able to tolerate airway clearance (Metaneb) intermittently due to pain, but requested to continue. Trach care was completed this shift. Will continue to monitor.

## 2018-09-06 NOTE — Unmapped (Signed)
Patient trach secure and patent.

## 2018-09-06 NOTE — Unmapped (Signed)
Pt A&O x4. Pt stable up until ambulation around noon. While ambulating pt felt SOB and portable SpO2 reading low 80s. Pt returned to room and was satting low 70s on the monitor. Pt placed back on ventilator and MD and RT notified. Satting well on 35% TC at rest. Complaints of R rib pain. PRN oxycodone 5mg  given x1 and once dose for oxy 7.5mg  ordered by MD and given. Sq lovenox given for VTE prophylaxis. No BM this shift, scheduled miralax given. G tube remains in place without complication. Standard precautions maintained. Bed low, call bell and bedside table within reach. Will continue to monitor.     Problem: Adult Inpatient Plan of Care  Goal: Plan of Care Review  Outcome: Progressing  Goal: Patient-Specific Goal (Individualization)  Outcome: Progressing  Goal: Absence of Hospital-Acquired Illness or Injury  Outcome: Progressing  Goal: Optimal Comfort and Wellbeing  Outcome: Progressing  Goal: Readiness for Transition of Care  Outcome: Progressing  Goal: Rounds/Family Conference  Outcome: Progressing     Problem: Pain Chronic (Persistent) (Comorbidity Management)  Goal: Acceptable Pain Control and Functional Ability  Outcome: Progressing     Problem: Communication Impairment (Artificial Airway)  Goal: Effective Communication  Outcome: Progressing     Problem: Device-Related Complication Risk (Artificial Airway)  Goal: Optimal Device Function  Outcome: Progressing     Problem: Skin and Tissue Injury (Artificial Airway)  Goal: Absence of Device-Related Skin or Tissue Injury  Outcome: Progressing     Problem: Breathing Pattern Ineffective  Goal: Effective Breathing Pattern  Outcome: Progressing     Problem: Inability to Wean (Mechanical Ventilation, Invasive)  Goal: Mechanical Ventilation Liberation  Outcome: Progressing     Problem: Ventilator-Induced Lung Injury (Mechanical Ventilation, Invasive)  Goal: Absence of Ventilator-Induced Lung Injury  Outcome: Progressing     Problem: Self-Care Deficit  Goal: Improved Ability to Complete Activities of Daily Living  Outcome: Progressing     Problem: Fall Injury Risk  Goal: Absence of Fall and Fall-Related Injury  Outcome: Progressing

## 2018-09-07 NOTE — Unmapped (Addendum)
Patient did well with doing all of their treatments today. Pt had brief panic attack, but stabilized shortly after. Pt also refused trach care day shift.    10/02/2018 1730  After panic spell Rt continued to wean and monitor pt from 70%fio2 to ultimately 40% gradually throughout the day. Pt regularly suctioned himself along with performing his own trach care as well. Pt would regularly refuse rt help with suctioning. Pt known to be very self sufficient. Approximately 1635 rt returned to pt room for final wean of the shift from 50% to 40% with sats maintaining in normal levels and observed pt in bed in no distress ordering dinner. Pt tolerated each wean trial with no issues.Approximately 10-15 min later rt was informed that pt was found unresponsive and floor staff had begun code procedures. Rt staff then joined code until code was called.

## 2018-09-07 NOTE — Unmapped (Signed)
Addendum to D/C summary on 29-Sep-2018.    I was notified by [facility] charge nurse that upon cleaning Cameron Baker's room last night, a white powdery substance was found. Hospital police apparently tested this substance this AM and reported it tested positive for cocaine. Due to this and the unexpectedness of [name]'s demise, case was electronically reviewed by Medical Examiner and they have opted to do a limited exam (blood work and external exam). ME planned to inform mother directly. Nursing supervisor aware.

## 2018-09-07 NOTE — Unmapped (Signed)
Physician Discharge Summary    Identifying Information:   Cameron Baker  01-29-1991  [id number]    Admit date: 08/27/2018    Discharge date: 10-Sep-2018     Discharge Service: Pulmonology (MD[facility])    Discharge Attending Physician: Dellis Filbert, MD    Discharge to: Deceased -  Cameron Baker had a pronouncement of death Sep 10, 2018 and the time of death is 1934 . The pronouncement of death was made by Dr. Hermelinda Medicus.   The events prior to death were Hypoxic respiratory failure 2/2 mucus plug c/b PEA arrest.  The parties present at time of death was/were physicians. The patient's HCPOA was notified of the death. This case was not referred to the medical examiner. An autopsy was unable to be discussed with the patient's guardian at this time.    Discharge Diagnoses:  Principal Problem:    Dyspnea  Active Problems:    Asthma    Hypothyroidism    Chronic tracheobronchitis (CMS-HCC)    Tracheostomy dependence (CMS-HCC)    VATER syndrome    PEG (percutaneous endoscopic gastrostomy) status (CMS-HCC)    History of home ventilator (CMS-HCC)    Bipolar 1 disorder (CMS-HCC)  Resolved Problems:    * No resolved hospital problems. [facility] Course:   ??  Cameron Baker is a 28 y.o. 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 His hospital course was complicated by recurring episodes of hypoxic respiratory failure secondary to mucus plugging. Unfortunately on 09-10-2018 a Code blue was called for acute hypoxic respiratory failure c/b PEA arrest. After multiple rounds of CPR the pt was pronounced deceased at 1934 by the MICU team. The events leading to this event are outlined below.     A code blue was called at approximately 1900 when the pt was found agonally breathing and desaturating to <60%. Upon arrival by the code teams the pt was noted to be cyanotic and in significant respiratory distress but breathing spontaneously. Initially respiratory resuscitation was complicated by presence of uncuffed tracheostomy. The tracheostomy was exchanged by RT. Immediately post placement of new cuff the pt was manually bag ventilated. Positive etCO2, increase in O2 sats and equal breath sounds noted. During manual bag ventilation the pt became progressively bradycardic and eventually lost his pulse. Chest compression started immediately. After multiple rounds of CPR spanning >16mins the code blue was terminated. Mother was notified of his passing. Autopsy was deferred at this time and family will decide once they arrive to the hospital.    Events prior to code that occurred during this hospital stay are listed below by problem.    Asthma/tracheobronchitis exacerbation- treated with prednisone taper, scheduled breathing therapy (atrovant, PRN albuterol). Levoquin and bactrim based on previous Stenotrophomonas tracheal aspirate. Clinical exam with improvement in wheezing. CXR on admission w/ evidence of improved previously visualized pulmonary opacities. CTA w/o acute pulmonary process and PE.    Sepsis- on 09/02/2018 the pt developed acute hypoxic respiratory failure that responded to positive pressure ventilation. This was followed by tachycardia, fever, hypotension and change in fluctuating mental status. Concern for aspiration as pt had body fluid around tracheostomy and mouth. The pt was treated with IVF resuscitation and broad abcx (vancomycin and cefepime). Blood cultures x 2 NGTD. LRCx Mixed gram positive and gram  negative organisms isolated. Repeat CXR with acute pulmonary pathology. Pt remained on vent during admission requiring 35-45% oxygen.    R. Flank pain, Tachycardia-  CTPE negative. No pneumothorax or effusion. Trace hemoptysis likely from bronchiectasis or coughing from tracheobronchitis. US abdomen with punctate kidney stones and patient reports a history of kidney stones. However stones are non-obstructing, UA without hematuria and symptoms started after patient reported being told he would be discharged. Patient has a history of frequent admissions and anxiety surrounding discharge.  - Pain control with scheduled tylenol, lidocaine patch, prn diclofenac and oxycodone   - Terazosin as tamsulosin cannot be crushed through g tube  ??  VATER with associated tracheobronchomalacia s/p trach and PEG, on chronic nocturnal vent  - Continued LABA/ICS  - Airway clearance   - Hold home azithromycin MWF while on levofloxacin   - Continue home Trilogy vent qhs (AVAPS, VT 525 ml, PEEP 10 cm H2O)  ??  Bipolar I: Continued home citalopram, valproate, mirtazapine, and lorazepam  Hypothyroidism: Continued home synthroid daily  Microcytic anemia: at baseline, no in hospital interventions.  Slow transit constipation: continued on??bentyl QID prn and daily miralax  Lower extremity muscle spasms: Continued on home??cyclobenzaprine prn  Acne:??Home minocycline   ??    Post Discharge Follow Up Issues:   NONE    Procedures:  None  No admission procedures for hospital encounter.  _____________________________________________________________________________  Discharge Day Services:  BP 236/183  - Pulse (!) 0 Comment: bagging patient via ambu bag to trach - Temp 37 ??C (Oral)  - Resp 20  - Ht 167 cm (5' 5.75)  - Wt (!) 107.1 kg (236 lb 1.8 oz)  - SpO2 (!) 39%  - BMI 38.40 kg/m??   Pt seen on the day of discharge and determined appropriate for discharge.    Condition at Discharge: Deceased    Length of Discharge: I spent greater than 30 mins in the discharge of this patient.  _____________________________________________________________________________  Discharge Medications:     Your Medication List      ASK your doctor about these medications    acetaminophen 325 MG tablet  Commonly known as: TYLENOL  Take 650 mg by mouth every six (6) hours as needed for pain.     albuterol 90 mcg/actuation inhaler  Commonly known as:  PROVENTIL HFA;VENTOLIN HFA  Inhale 2 puffs every six (6) hours as needed for wheezing.     amoxicillin-clavulanate 875-125 mg per tablet  Commonly known as:  AUGMENTIN  Take 1 tablet by mouth every twelve (12) hours. for 9 days  Ask about: Should I take this medication?     arformoterol 15 mcg/2 mL  Commonly known as:  BROVANA  Inhale 2 mL (15 mcg total) by nebulization Two (2) times a day.     azithromycin 500 MG tablet  Commonly known as:  ZITHROMAX  1 tablet (500 mg total) by G-tube route 3 (three) times a week.     budesonide 0.5 mg/2 mL nebulizer solution  Commonly known as:  PULMICORT  Inhale 4 mL (1 mg total) by nebulization Two (2) times a day.     budesonide-formoterol 160-4.5 mcg/actuation inhaler  Commonly known as:  SYMBICORT  Inhale 2 puffs Two (2) times a day.     cetirizine 10 MG tablet  Commonly known as:  ZyrTEC  1 tablet (10 mg total) by G-tube route every evening.     cholecalciferol (vitamin D3) 2,000 unit Cap  3 capsules (6,000 Units total) by G-tube route  daily.     citalopram 40 MG tablet  Commonly known as:  CeleXA  1 tablet (40 mg total) by G-tube route nightly.     clindamycin 1 % gel  Commonly known as:  CLINDAGEL  Apply topically Two (2) times a day.     cyclobenzaprine 5 MG tablet  Commonly known as:  FLEXERIL  Take 1 tablet (5 mg total) by mouth Three (3) times a day as needed for muscle spasms.     diclofenac sodium 1 % gel  Commonly known as:  VOLTAREN  Apply 2 g topically 4 (four) times a day as needed.     dicyclomine 10 mg capsule  Commonly known as:  BENTYL  10 mg by G-tube route 4 (four) times a day as needed.     fluticasone propionate 50 mcg/actuation nasal spray  Commonly known as:  FLONASE  2 sprays by Each Nare route daily.     gabapentin 300 MG capsule  Commonly known as:  NEURONTIN  1 capsule (300 mg total) by G-tube route Three (3) times a day. ibuprofen 600 MG tablet  Commonly known as:  ADVIL,MOTRIN  1 tablet (600 mg total) by G-tube route every six (6) hours as needed for pain.     ipratropium-albuterol 0.5-2.5 mg/3 mL nebulizer  Commonly known as:  DUO-NEB  Inhale 3 mL 4 (four) times a day.     levothyroxine 75 MCG tablet  Commonly known as:  SYNTHROID, LEVOTHROID  1 tablet (75 mcg total) by G-tube route daily.     lidocaine-prilocaine 2.5-2.5 % cream  Commonly known as:  EMLA  APPLY TOPICALLY DAILY AS NEEDED FOR PORT ACCESS     linaCLOtide 290 mcg capsule  Commonly known as:  LINZESS  1 capsule (290 mcg total) by G-tube route daily.     LORazepam 0.5 MG tablet  Commonly known as:  ATIVAN  Take 1 tablet (0.5mg ) twice daily and 1 tablet (0.5mg ) daily as needed for anxiety via G-tube     minocycline 100 MG capsule  Commonly known as:  MINOCIN,DYNACIN  1 capsule (100 mg total) by G-tube route nightly.     mirtazapine 15 MG tablet  Commonly known as:  REMERON  1 tablet (15 mg total) by G-tube route nightly.     montelukast 10 mg tablet  Commonly known as:  SINGULAIR  Take 1 tablet (10 mg total) by mouth nightly.     nebulizers Misc  Provide 1 device  for use with inhaled medication.     NORMAL SALINE FLUSH 0.9 % injection  Generic drug:  sodium chloride  INFUSE INTO VENOUS CATHETER 4 TIMES DAILY AS NEEDED (PORT FLUSH)     omeprazole 20 MG capsule  Commonly known as:  PriLOSEC  20 mg Two (2) times a day (30 minutes before a meal). Via G-tube     sodium chloride 0.9 % Soln  Commonly known as:  NS  Infuse 10 mL into a venous catheter 4 (four) times a day as needed (port flush).     sodium chloride 3 % nebulizer solution  Inhale 4 mL by nebulization 4 (four) times a day.     tobramycin (PF) 300 mg/5 mL nebulizer solution  Commonly known as:  TOBI  INHALE (300MG ) VIA NEBULIZER EVERY TWELVE HOURS EVERY OTHER MONTH     triamcinolone 0.1 % cream  Commonly known as:  KENALOG  Apply topically Two (2) times a day.     valproate 250 mg/5 mL syrup  Commonly known as:  DEPAKENE  Take 5ml (250mg ) by G-tube every morning and 15ml (750mg ) every evening          _____________________________________________________________________________  Pending Test Results (if blank, then none):   Order Current Status    Blood Culture #1 Preliminary result    Blood Culture #2 Preliminary result          Most Recent Labs:  Microbiology Results (last day)     Procedure Component Value Date/Time Date/Time    Lower Respiratory Culture [[id number]] Collected:  09/02/18 1016    Lab Status:  Final result Specimen:  Aspirate, Tracheal from Tracheal aspirate Updated:  08/29/2018 1402     Lower Respiratory Culture OROPHARYNGEAL FLORA ISOLATED     Gram Stain 10-25 PMNS/LPF      <10  Epithelial cells/LPF      3+ Gram positive bacilli      2+ Smear Results Suggest Mixed oral flora      Acceptable for culture    Narrative:       Specimen Source: Tracheal aspirate    Blood Culture #1 [[id number]] Collected:  09/02/18 0750    Lab Status:  Preliminary result Specimen:  Blood from 1 Peripheral Draw Updated:  09/02/2018 0815     Blood Culture, Routine No Growth at 4 days    Blood Culture #2 [[id number]] Collected:  09/02/18 0756    Lab Status:  Preliminary result Specimen:  Blood from 1 Peripheral Draw Updated:  08/11/2018 0815     Blood Culture, Routine No Growth at 4 days          Lab Results   Component Value Date    WBC 17.3 (H) 08/18/2018    HGB 11.9 (L) 08/28/2018    HCT 41.6 08/14/2018    PLT 197 08/31/2018       Lab Results   Component Value Date    NA 146 (H) 08/24/2018    NA 151 (H) 08/14/2018    K 5.7 (H) 08/29/2018    K 5.6 (H) 08/25/2018    CL 96 (L) 08/13/2018    CO2 24.0 08/12/2018    BUN 19 09/05/2018    CREATININE 1.34 (H) 08/11/2018    CALCIUM 14.3 (HH) 09/04/2018    MG 1.7 08/26/2018    PHOS 4.1 03/29/2018       Lab Results   Component Value Date    ALKPHOS 52 09/03/2018    BILITOT 0.5 09/03/2018    BILIDIR <0.10 09/01/2018    PROT 6.2 (L) 09/03/2018    ALBUMIN 3.4 (L) 09/03/2018 ALT 32 09/03/2018    AST 20 09/03/2018       Lab Results   Component Value Date    PT 11.1 05/03/2018    INR 0.97 05/03/2018    APTT 36.2 09/05/2018     Hospital Radiology:  Xr Chest Portable    Result Date: 08/30/2018  EXAM: XR CHEST PORTABLE DATE: 08/13/2018 4:10 PM ACCESSION: [id number] UN DICTATED: 09/01/2018 4:12 PM [facility]PRET[facility] CLINICAL INDICATION: 27 years old Male with SHORTNESS OF BREATH  COMPARISON: 09/04/2018. TECHNIQUE: Portable Chest Radiograph. FINDINGS: Unchanged right-sided Port-A-Cath and tracheostomy tube. New interstitial pulmonary edema. No pleural effusion or pneumothorax Cardiomegaly.     New interstitial pulmonary edema.    Xr Chest Portable    Result Date: 09/04/2018  EXAM: XR CHEST PORTABLE DATE: 09/04/2018 7:22 AM ACCESSION: [id number] UN DICTATED: 09/04/2018 12:23 PM [facility]PRETATION [facility]: [facility] CLINICAL INDICATION: 27 years old Male with PNEUMONIA  COMPARISON: 09/02/2018 TECHNIQUE:  Portable Chest Radiograph. FINDINGS: Unchanged lines and tubes. The lungs are clear There is no pneumothorax. Stable cardiomediastinal silhouette with cardiomegaly.     As above    Xr Chest Portable    Result Date: 09/03/2018  EXAM: XR CHEST PORTABLE DATE: 09/02/2018 10:25 PM ACCESSION: [id number] UN DICTATED: 09/02/2018 10:58 PM [facility]PRETATION [facility] CLINICAL INDICATION: 27 years old Male with CHEST PAIN  COMPARISON: Same day chest radiographs. TECHNIQUE: Portable semiupright Chest Radiograph. FINDINGS: Unchanged lines and tubes. Hypoinflated lungs. Similar-appearing mild pulmonary edema. No pleural effusions. No pneumothorax. Stable cardiomediastinal silhouette.     Unchanged mild pulmonary edema.    Xr Chest Portable    Result Date: 09/02/2018  EXAM: XR CHEST PORTABLE DATE: 09/02/2018 2:13 AM ACCESSION: [id number] UN DICTATED: 09/02/2018 4:01 AM [facility]PRETATION [facility] CLINICAL INDICATION: 27 years old Male with HYPOXEMIA  COMPARISON: Chest radiograph on 09/01/2018. TECHNIQUE: Portable Chest Radiograph. FINDINGS: Unchanged lines and tubes. Hypoinflated lungs. Increased interstitial markings. No pleural effusion. No pneumothorax. Stable cardiomediastinal silhouette. No soft tissue abnormality. No acute osseous abnormality.     Increase interstitial markings suggestive of mild pulmonary edema.    Xr Chest Portable    Result Date: 09/02/2018  EXAM: XR CHEST PORTABLE DATE: 09/01/2018 11:51 PM ACCESSION: [id number] UN DICTATED: 09/02/2018 1:02 AM [facility]PRET[facility] CLINICAL INDICATION: 27 years old Male with HYPOXEMIA  COMPARISON: Chest radiographs on 08/28/2018 as well as CTA chest on 08/31/2018 TECHNIQUE: Portable Chest Radiograph. FINDINGS: Unchanged lines and tubes. No focal consolidation. No pleural effusion or pneumothorax Stable cardiomediastinal silhouette.     No acute airspace disease.    Xr Ribs Right W Pa Chest    Result Date: 08/31/2018  EXAM: XR RIBS RIGHT W PA CHEST DATE: 08/30/2018 8:32 PM ACCESSION: [id number] UN DICTATED: 08/31/2018 8:10 AM [facility]PRET[facility] CLINICAL INDICATION: 27 years old Male with R sided rib pain  COMPARISON: CXR 08/28/2018 and earlier TECHNIQUE: PA and lateral views of the chest as well as AP and right posterior oblique views of the right ribs. FINDINGS: Tracheostomy tube and right-sided implantable venous access device are unchanged. There is no interval consolidation. No pleural effusion or pneumothorax. Mildly enlarged cardiac silhouette unchanged. No rib fracture.     No rib fracture.    Ct Head Wo Contrast    Result Date: 09/03/2018  EXAM: Computed tomography, head or brain without contrast material. DATE: 09/03/2018 12:42 AM ACCESSION: [id number] UN DICTATED: 09/03/2018 1:34 AM [facility]PRETATION [facility] CLINICAL INDICATION: 27 years old Male with ALTERED MENTAL STATUS  COMPARISON: None TECHNIQUE: Axial CT images of the head  from skull base to vertex without contrast. FINDINGS: There is no midline shift or mass lesion.  There is no evidence of intracranial hemorrhage or acute infarct.  No fractures are evident.  The sinuses are pneumatized.     No acute intracranial abnormality.    US Abdomen Complete    Result Date: 09/01/2018  EXAM: US ABDOMEN COMPLETE DATE: 09/01/2018 4:33 AM ACCESSION: [id number] UN DICTATED: 09/01/2018 4:50 AM [facility]PRET[facility] CLINICAL INDICATION: 27 years old Male with gall bladder disease and concern for right sided hydronephrosis  TECHNIQUE: Static and cine images of the complete abdomen were performed. COMPARISON: CT abdomen pelvis 04/05/2018 FINDINGS: LIVER: The liver is mildly enlarged and diffusely echogenic. No focal hepatic lesions. No intrahepatic biliary ductal dilatation. The common bile duct is normal in caliber. GALLBLADDER: The gallbladder is contracted, limiting evaluation, although no stones are appreciated sonographically. PANCREAS: Visualized portion is unremarkable. SPLEEN: Normal in size and  echotexture. KIDNEYS: Bilateral echogenic foci reflecting stones as seen on prior CT. No hydronephrosis. No solid renal masses.. VESSELS: Visualized proximal aorta and IVC are unremarkable. OTHER: No ascites.     --Mild hepatomegaly and hepatic steatosis. --The gallbladder is contracted without identifiable stones. No biliary ductal dilatation. --Punctate bilateral nephrolithiasis without hydronephrosis. Please see below for data measurements: Liver: 17.8 cm Common hepatic duct: .16 cm Proximal common bile duct: .24 cm Distal common bile duct: .39 cm Gallbladder wall: 2.6 mm Sonographic Murphy's Sign: patient medicated Pericholecystic fluid visualized: no Right kidney: 11.1 cm Left kidney: 11.2 cm Aorta: not well visualized Inferior vena cava: partially visualized Spleen: 12.7 cm     Xr Chest 2 Views    Result Date: 08/28/2018  EXAM: XR CHEST 2 VIEWS DATE: 08/28/2018 1:50 AM ACCESSION: [id number] UN DICTATED: 08/28/2018 1:51 AM [facility]PRE[facility] CLINICAL INDICATION: 27 years old Male with COUGH  COMPARISON: Chest radiograph 08/19/2018. TECHNIQUE: PA and Lateral Chest Radiographs. FINDINGS: Right midlung opacity, greatly decreased since 08/19/2018. The left lung is radiographically clear. No pleural effusion or pneumothorax. Unremarkable cardiomediastinal silhouette. Tracheostomy in place. Right chest wall port with catheter tip overlying the right atrium.     Near complete resolution of previously visualized pulmonary opacities with faint residual right midlung opacity.    Cta Chest W Contrast    Result Date: 08/31/2018  EXAM: CTA CHEST W CONTRAST DATE: 08/31/2018 ACCESSION: [id number] UN DICTATED: 08/31/2018 3:25 PM [facility]PRETATION [facility] CLINICAL INDICATION: 27 years old Male with concern for pulmonary embolism. COMPARISON: Same-day chest radiograph TECHNIQUE: A spiral CTA scan was obtained with IV contrast from the lung apices to the lung bases. Images were reconstructed in the axial plane. Multiplanar reformatted and MIP images were provided. Please note, evaluation of the lung parenchyma and distal subsegmental pulmonary arteries is limited secondary to respiratory motion. FINDINGS: LINES/TUBES: Right-sided chest port with tip terminating within the distal right atrium. PULMONARY ARTERIES: No evidence of pulmonary embolism. AIRWAYS, LUNGS, PLEURA: Tracheostomy cannula in place. Evaluation of the lung parenchyma is somewhat limited secondary to respiratory motion. There is mosaic lung parenchymal attenuation. No consolidation. No pleural effusion. No pneumothorax. MEDIASTINUM: Heart size normal. No pericardial effusion.  Normal caliber thoracic aorta. Mildly prominent pulmonary trunk, unchanged from prior. No mediastinal or hilar lymphadenopathy. IMAGED ABDOMEN: Visualized upper abdomen is unremarkable. SOFT TISSUES/BONES: Unremarkable.     No evidence of pulmonary embolism. No acute intrathoracic infectious process.    Xr Abdomen 1 View Result Date: 09/03/2018  EXAM: XR ABDOMEN 1 VIEW DATE: 09/02/2018 11:18 PM ACCESSION: [id number] UN DICTATED: 09/02/2018 11:58 PM [facility]PRETATION [facility]: [facility] CLINICAL INDICATION: 27 years old Male with ABDOMINAL PAIN  -  UNSPECIFIED SITE  COMPARISON: Abdomen ultrasound on 09/01/2018. TECHNIQUE: Supine view of the abdomen. FINDINGS: Nonobstructive bowel gas pattern. Gastrostomy tube in place, overlying the left upper quadrant. No acute osseous abnormality. The lung bases are clear.     Nonobstructive gas pattern.      _____________________________________________________________________________  Discharge Instructions:

## 2018-09-07 DEATH — deceased

## 2018-10-08 DEATH — deceased

## 2019-01-20 NOTE — Unmapped (Signed)
[facility] form addendum plan of care from 04/28/2017.  It was never signed. Signed by Dr. Raford Pitcher
